# Patient Record
Sex: Male | Born: 1955
Health system: Southern US, Community
[De-identification: ages and names within clinical notes are randomized; demographics above are authoritative.]

## PROBLEM LIST (undated history)

## (undated) DIAGNOSIS — I639 Cerebral infarction, unspecified: Secondary | ICD-10-CM

## (undated) DIAGNOSIS — H269 Unspecified cataract: Secondary | ICD-10-CM

## (undated) DIAGNOSIS — N429 Disorder of prostate, unspecified: Secondary | ICD-10-CM

## (undated) DIAGNOSIS — F32A Depression, unspecified: Secondary | ICD-10-CM

## (undated) DIAGNOSIS — F329 Major depressive disorder, single episode, unspecified: Secondary | ICD-10-CM

## (undated) DIAGNOSIS — F419 Anxiety disorder, unspecified: Secondary | ICD-10-CM

## (undated) HISTORY — DX: Cerebral infarction, unspecified: I63.9

## (undated) HISTORY — PX: HERNIA REPAIR: SHX51

## (undated) HISTORY — DX: Unspecified cataract: H26.9

## (undated) HISTORY — DX: Anxiety disorder, unspecified: F41.9

---

## 1999-04-16 ENCOUNTER — Observation Stay (HOSPITAL_COMMUNITY): Admission: RE | Admit: 1999-04-16 | Discharge: 1999-04-17 | Payer: Self-pay | Admitting: Surgery

## 2005-05-17 ENCOUNTER — Ambulatory Visit: Payer: Self-pay | Admitting: Internal Medicine

## 2006-05-01 ENCOUNTER — Emergency Department (HOSPITAL_COMMUNITY): Admission: EM | Admit: 2006-05-01 | Discharge: 2006-05-01 | Payer: Self-pay | Admitting: Emergency Medicine

## 2009-04-13 ENCOUNTER — Ambulatory Visit: Payer: Self-pay | Admitting: Gastroenterology

## 2018-01-08 ENCOUNTER — Other Ambulatory Visit: Payer: Self-pay

## 2018-01-08 ENCOUNTER — Encounter: Payer: Self-pay | Admitting: Emergency Medicine

## 2018-01-08 ENCOUNTER — Emergency Department: Payer: BLUE CROSS/BLUE SHIELD

## 2018-01-08 ENCOUNTER — Observation Stay
Admission: EM | Admit: 2018-01-08 | Discharge: 2018-01-12 | Payer: BLUE CROSS/BLUE SHIELD | Attending: Internal Medicine | Admitting: Internal Medicine

## 2018-01-08 DIAGNOSIS — Z79899 Other long term (current) drug therapy: Secondary | ICD-10-CM | POA: Insufficient documentation

## 2018-01-08 DIAGNOSIS — R531 Weakness: Secondary | ICD-10-CM | POA: Diagnosis present

## 2018-01-08 DIAGNOSIS — M609 Myositis, unspecified: Secondary | ICD-10-CM | POA: Diagnosis not present

## 2018-01-08 DIAGNOSIS — F332 Major depressive disorder, recurrent severe without psychotic features: Secondary | ICD-10-CM | POA: Diagnosis not present

## 2018-01-08 DIAGNOSIS — L8990 Pressure ulcer of unspecified site, unspecified stage: Secondary | ICD-10-CM

## 2018-01-08 DIAGNOSIS — M549 Dorsalgia, unspecified: Secondary | ICD-10-CM | POA: Diagnosis present

## 2018-01-08 DIAGNOSIS — E43 Unspecified severe protein-calorie malnutrition: Secondary | ICD-10-CM

## 2018-01-08 DIAGNOSIS — Z87891 Personal history of nicotine dependence: Secondary | ICD-10-CM | POA: Insufficient documentation

## 2018-01-08 DIAGNOSIS — E559 Vitamin D deficiency, unspecified: Secondary | ICD-10-CM | POA: Diagnosis not present

## 2018-01-08 DIAGNOSIS — R2689 Other abnormalities of gait and mobility: Secondary | ICD-10-CM | POA: Diagnosis present

## 2018-01-08 DIAGNOSIS — E440 Moderate protein-calorie malnutrition: Secondary | ICD-10-CM

## 2018-01-08 HISTORY — DX: Depression, unspecified: F32.A

## 2018-01-08 HISTORY — DX: Major depressive disorder, single episode, unspecified: F32.9

## 2018-01-08 LAB — CBC
HEMATOCRIT: 40 % (ref 40.0–52.0)
Hemoglobin: 14 g/dL (ref 13.0–18.0)
MCH: 32 pg (ref 26.0–34.0)
MCHC: 35.1 g/dL (ref 32.0–36.0)
MCV: 91.3 fL (ref 80.0–100.0)
Platelets: 190 10*3/uL (ref 150–440)
RBC: 4.38 MIL/uL — ABNORMAL LOW (ref 4.40–5.90)
RDW: 13 % (ref 11.5–14.5)
WBC: 6.2 10*3/uL (ref 3.8–10.6)

## 2018-01-08 LAB — COMPREHENSIVE METABOLIC PANEL
ALT: 93 U/L — ABNORMAL HIGH (ref 0–44)
ANION GAP: 8 (ref 5–15)
AST: 291 U/L — ABNORMAL HIGH (ref 15–41)
Albumin: 3.8 g/dL (ref 3.5–5.0)
Alkaline Phosphatase: 50 U/L (ref 38–126)
BUN: 17 mg/dL (ref 8–23)
CHLORIDE: 103 mmol/L (ref 98–111)
CO2: 27 mmol/L (ref 22–32)
Calcium: 8.7 mg/dL — ABNORMAL LOW (ref 8.9–10.3)
Creatinine, Ser: 0.9 mg/dL (ref 0.61–1.24)
Glucose, Bld: 117 mg/dL — ABNORMAL HIGH (ref 70–99)
Potassium: 3.7 mmol/L (ref 3.5–5.1)
SODIUM: 138 mmol/L (ref 135–145)
Total Bilirubin: 2 mg/dL — ABNORMAL HIGH (ref 0.3–1.2)
Total Protein: 7 g/dL (ref 6.5–8.1)

## 2018-01-08 LAB — ETHANOL

## 2018-01-08 LAB — URINALYSIS, COMPLETE (UACMP) WITH MICROSCOPIC
BACTERIA UA: NONE SEEN
BILIRUBIN URINE: NEGATIVE
Glucose, UA: NEGATIVE mg/dL
KETONES UR: 5 mg/dL — AB
LEUKOCYTES UA: NEGATIVE
NITRITE: NEGATIVE
PROTEIN: NEGATIVE mg/dL
Specific Gravity, Urine: 1.011 (ref 1.005–1.030)
Squamous Epithelial / LPF: NONE SEEN (ref 0–5)
pH: 6 (ref 5.0–8.0)

## 2018-01-08 LAB — TROPONIN I

## 2018-01-08 LAB — SALICYLATE LEVEL

## 2018-01-08 LAB — ACETAMINOPHEN LEVEL

## 2018-01-08 LAB — TSH: TSH: 2.553 u[IU]/mL (ref 0.350–4.500)

## 2018-01-08 MED ORDER — ONDANSETRON HCL 4 MG PO TABS: 4.0000 mg | ORAL_TABLET | Freq: Four times a day (QID) | ORAL | Status: DC | PRN

## 2018-01-08 MED ORDER — ONDANSETRON HCL 4 MG/2ML IJ SOLN: 4.0000 mg | Freq: Four times a day (QID) | INTRAMUSCULAR | Status: DC | PRN

## 2018-01-08 MED ORDER — IBUPROFEN 400 MG PO TABS: 600.0000 mg | ORAL_TABLET | Freq: Four times a day (QID) | ORAL | Status: DC | PRN

## 2018-01-08 MED ORDER — ACETAMINOPHEN 325 MG PO TABS: 650.0000 mg | ORAL_TABLET | Freq: Four times a day (QID) | ORAL | Status: DC | PRN

## 2018-01-08 MED ORDER — ACETAMINOPHEN 650 MG RE SUPP: 650.0000 mg | Freq: Four times a day (QID) | RECTAL | Status: DC | PRN

## 2018-01-08 MED ORDER — ENOXAPARIN SODIUM 40 MG/0.4ML ~~LOC~~ SOLN: 40.0000 mg | SUBCUTANEOUS | Status: DC

## 2018-01-08 NOTE — ED Notes (Signed)
EMS pt to lobby , via wheelchair , back pain x4days , no injury , neuro intact

## 2018-01-08 NOTE — H&P (Signed)
[facility] [place] NAME: [name]    MR#:  [id number]  DATE OF BIRTH:  [birth year]  DATE OF ADMISSION:  01/08/2018  PRIMARY CARE PHYSICIAN: [facility]   REQUESTING/REFERRING PHYSICIAN: Clearnce Hasten, MD  CHIEF COMPLAINT:   Chief Complaint  Patient presents with  . Weakness    HISTORY OF PRESENT ILLNESS:  [name]  is a 62 y.o. male who presents with complaint of weakness and back pain.  Patient states that he has been so weak over the past couple weeks he has had a hard time getting out of bed to do anything.  He states he often feels unsteady on his feet, has something of a shuffling gait.  Some concern by ED physician for possible Parkinson's disease.  PAST MEDICAL HISTORY:   Past Medical History:  Diagnosis Date  . Depression      PAST SURGICAL HISTORY:   Past Surgical History:  Procedure Laterality Date  . HERNIA REPAIR       SOCIAL HISTORY:   Social History   Tobacco Use  . Smoking status: Former Smoker  Substance Use Topics  . Alcohol use: Not on file     FAMILY HISTORY:  Family History reviewed and is noncontributory   DRUG ALLERGIES:  No Known Allergies  MEDICATIONS AT HOME:   Prior to Admission medications   Medication Sig Start Date End Date Taking? Authorizing Provider  ibuprofen (ADVIL,MOTRIN) 100 MG tablet Take 200 mg by mouth every 6 (six) hours as needed for pain or fever.   Yes [provider]    REVIEW OF SYSTEMS:  Review of Systems  Constitutional: Positive for malaise/fatigue. Negative for chills, fever and weight loss.  HENT: Negative for ear pain, hearing loss and tinnitus.   Eyes: Negative for blurred vision, double vision, pain and redness.  Respiratory: Negative for cough, hemoptysis and shortness of breath.   Cardiovascular: Negative for chest pain, palpitations, orthopnea and leg swelling.  Gastrointestinal: Negative for abdominal pain,  constipation, diarrhea, nausea and vomiting.  Genitourinary: Negative for dysuria, frequency and hematuria.  Musculoskeletal: Positive for back pain. Negative for joint pain and neck pain.  Skin:       No acne, rash, or lesions  Neurological: Positive for weakness. Negative for dizziness, tremors and focal weakness.  Endo/Heme/Allergies: Negative for polydipsia. Does not bruise/bleed easily.  Psychiatric/Behavioral: Negative for depression. The patient is not nervous/anxious and does not have insomnia.      VITAL SIGNS:   Vitals:   01/08/18 1800 01/08/18 2000 01/08/18 2030 01/08/18 2100  BP: (!) 145/82 136/78 140/82 135/83  Pulse: 72 66 64 70  Resp: 17 12 13 11   Temp:      TempSrc:      SpO2: 100% 100% 99% 100%  Weight:      Height:       Wt Readings from Last 3 Encounters:  01/08/18 68.9 kg (152 lb)    PHYSICAL EXAMINATION:  Physical Exam  Vitals reviewed. Constitutional: He is oriented to person, place, and time. He appears well-developed and well-nourished. No distress.  HENT:  Head: Normocephalic and atraumatic.  Mouth/Throat: Oropharynx is clear and moist.  Eyes: Pupils are equal, round, and reactive to light. Conjunctivae and EOM are normal. No scleral icterus.  Neck: Normal range of motion. Neck supple. No JVD present. No thyromegaly present.  Cardiovascular: Normal rate, regular rhythm and intact distal pulses. Exam reveals no gallop and no friction rub.  No murmur heard. Respiratory: Effort normal and breath sounds normal. No respiratory distress. He has no wheezes. He has no rales.  GI: Soft. Bowel sounds are normal. He exhibits no distension. There is no tenderness.  Musculoskeletal: Normal range of motion. He exhibits no edema.  No arthritis, no gout  Lymphadenopathy:    He has no cervical adenopathy.  Neurological: He is alert and oriented to person, place, and time. No cranial nerve deficit.  No dysarthria, no aphasia, and does not have any cogwheel  rigidity, he does have some proximal lower extremity weakness  Skin: Skin is warm and dry. No rash noted. No erythema.  Psychiatric: His behavior is normal. Judgment and thought content normal.  Depressed affect    LABORATORY PANEL:   CBC Recent Labs  Lab 01/08/18 1702  WBC 6.2  HGB 14.0  HCT 40.0  PLT 190   ------------------------------------------------------------------------------------------------------------------  Chemistries  Recent Labs  Lab 01/08/18 1702  NA 138  K 3.7  CL 103  CO2 27  GLUCOSE 117*  BUN 17  CREATININE 0.90  CALCIUM 8.7*  AST 291*  ALT 93*  ALKPHOS 50  BILITOT 2.0*   ------------------------------------------------------------------------------------------------------------------  Cardiac Enzymes Recent Labs  Lab 01/08/18 1702  TROPONINI <0.03   ------------------------------------------------------------------------------------------------------------------  RADIOLOGY:  Ct Head Wo Contrast  Result Date: 01/08/2018 CLINICAL DATA:  Weakness, back pain for 4 days, no injury. EXAM: CT HEAD WITHOUT CONTRAST TECHNIQUE: Contiguous axial images were obtained from the base of the skull through the vertex without intravenous contrast. COMPARISON:  05/01/2006. FINDINGS: Brain: No evidence for acute infarction, hemorrhage, mass lesion, hydrocephalus, or extra-axial fluid. Mild atrophy. Hypoattenuation of white matter, likely small vessel disease. Vascular: No hyperdense vessel or unexpected calcification. Skull: Normal. Negative for fracture or focal lesion. Sinuses/Orbits: No acute finding. Other: None. IMPRESSION: Mild atrophy. No acute intracranial findings. Progression of cerebral volume loss since 2007. Electronically Signed   By: Staci Righter M.D.   On: 01/08/2018 19:10   Mr Lumbar Spine Wo Contrast  Result Date: 01/08/2018 CLINICAL DATA:  Initial evaluation for generalized weakness with low back pain for 4 days. EXAM: MRI LUMBAR SPINE WITHOUT  CONTRAST TECHNIQUE: Multiplanar, multisequence MR imaging of the lumbar spine was performed. No intravenous contrast was administered. COMPARISON:  None. FINDINGS: Segmentation: Normal segmentation. Lowest well-formed disc labeled the L5-S1 level. Alignment: Trace retrolisthesis of L2 on L3. Vertebral bodies otherwise normally aligned with preservation of the normal lumbar lordosis. Vertebrae: Vertebral body heights maintained without evidence for acute or chronic fracture. Bone marrow signal intensity within normal limits. No discrete or worrisome osseous lesions. Chronic reactive endplate changes present about the L5-S1 interspace. Conus medullaris and cauda equina: Conus extends to the T12 level. Conus and cauda equina appear normal. Paraspinal and other soft tissues: Mild paraspinous edema within the lower posterior paraspinous musculature, which could reflect myositis and/or muscular injury/strain. Subcentimeter T2 hyperintense cyst noted within the right kidney. Visualized visceral structures otherwise unremarkable. Disc levels: L1-2: Chronic intervertebral disc space narrowing without significant disc bulge. No stenosis. L2-3: Trace retrolisthesis. Mild chronic intervertebral disc space narrowing with diffuse disc bulge and disc desiccation. Mild facet hypertrophy. No stenosis. L3-4: Mild diffuse disc bulge with disc desiccation. Mild facet ligament flavum hypertrophy. Superimposed shallow right foraminal disc protrusion contacts the exiting right L3 nerve root (series 3, image 3). No significant spinal stenosis. Mild to moderate right L3 foraminal narrowing. L4-5: Mild diffuse disc bulge. Mild facet hypertrophy. No significant spinal stenosis. Mild bilateral L4 foraminal narrowing, slightly greater  on the right. L5-S1: Chronic intervertebral disc space narrowing with diffuse disc bulge and disc desiccation. Mild facet hypertrophy, greater on the right. No significant spinal stenosis. Mild to moderate left L5  foraminal stenosis. IMPRESSION: 1. No evidence for cord compression or cauda equina. 2. Soft tissue edema involving the posterior paraspinous musculature bilaterally, right slightly greater than left. Finding is of uncertain etiology or significance, but could reflect acute myositis or possibly muscular injury/strain. Correlation with history and CPK suggested. 3. Shallow right foraminal disc protrusion at L3-4, contacting and potentially affecting the right L3 nerve root. 4. Disc bulge with facet hypertrophy at L4-5 and L5-S1 with resultant mild bilateral L4 with mild to moderate left L5 foraminal stenosis. Electronically Signed   By: Jeannine Boga M.D.   On: 01/08/2018 22:05    EKG:   Orders placed or performed during the hospital encounter of 01/08/18  . ED EKG  . ED EKG  . EKG 12-Lead  . EKG 12-Lead    IMPRESSION AND PLAN:  Principal Problem:   Back pain -MRI showed muscular inflammation likely causing the patient's back pain.  PRN anti-inflammatory Active Problems:   Shuffling gait -some concern for possible Parkinson's, will get a neurology consult, will also check thyroid function and labs for any possibility of polymyositis   Depression -patient has a history of significant depression, family seems to feel like he is quite depressed recently and this is contributing to his overall decline.  We will get a psychiatry consult  Chart review performed and case discussed with ED provider. Labs, imaging and/or ECG reviewed by provider and discussed with patient/family. Management plans discussed with the patient and/or family.  DVT PROPHYLAXIS: SubQ lovenox  GI PROPHYLAXIS: None  ADMISSION STATUS: Observation  CODE STATUS: Full  TOTAL TIME TAKING CARE OF THIS PATIENT: 40 minutes.   [name] 01/08/2018, 10:27 PM  [facility]  Office  [phone]  CC: Primary care physician; [place]  Note:  This document was prepared  using Dragon voice recognition software and may include unintentional dictation errors.

## 2018-01-08 NOTE — ED Triage Notes (Addendum)
General weakness x 1 year. States has had increased back pain. Denies taking medication for it.

## 2018-01-08 NOTE — ED Notes (Signed)
Patient transported to MRI 

## 2018-01-08 NOTE — ED Notes (Signed)
Pt. Returned to tx. room in stable condition with no acute changes since departure from unit for scans.   

## 2018-01-08 NOTE — ED Notes (Signed)
1C unable to take report at this time.  

## 2018-01-08 NOTE — ED Notes (Signed)
Patient transported to CT 

## 2018-01-08 NOTE — ED Provider Notes (Signed)
[facility] Emergency Department Provider Note ____________________________________________   First MD Initiated Contact with Patient 01/08/18 1703     (approximate)  I have reviewed the triage vital signs and the nursing notes.   HISTORY  Chief Complaint Weakness  HPI JAYKE CAUL is a 62 y.o. male with a history of depression as well as back pain over the past 3 to 4 years was presented to the emergency department today with bilateral lower externally weakness which is worsened over the past 3 to 4 days.  He says it feels like a burning when he gets up and starts to move.  His family also reports a shuffling gait.  Says that he is normally very "relaxed" with slow speech.  No family history of Parkinson's disease or dementia.  Patient says that he is also had some history of urinary incontinence which is chronic.  No loss of bowel continence.  Denies any numbness.  He says that the back pain does sometimes radiate down his right lower extremity.  The patient is also accompanied by his grandson who says that the symptoms have been progressive over the last 3 to 4 years and that he is concerned that the patient is severely depressed.  The patient does not drink.  The grandson reports that the patient has never threatened suicide.   Past Medical History:  Diagnosis Date  . Depression     There are no active problems to display for this patient.   Past Surgical History:  Procedure Laterality Date  . HERNIA REPAIR      Prior to Admission medications   Not on File    Allergies Patient has no known allergies.  No family history on file.  Social History Social History   Tobacco Use  . Smoking status: Former Smoker  Substance Use Topics  . Alcohol use: Not on file  . Drug use: Not on file    Review of Systems  Constitutional: No fever/chills Eyes: No visual changes. ENT: No sore throat. Cardiovascular: Denies chest pain. Respiratory:  Denies shortness of breath. Gastrointestinal: No abdominal pain.  No nausea, no vomiting.  No diarrhea.  No constipation. Genitourinary: As above Musculoskeletal: Negative for back pain. Skin: Negative for rash. Neurological: Negative for headaches   ____________________________________________   PHYSICAL EXAM:  VITAL SIGNS: ED Triage Vitals  Enc Vitals Group     BP 01/08/18 1654 133/82     Pulse Rate 01/08/18 1654 88     Resp 01/08/18 1654 16     Temp 01/08/18 1654 97.9 F (36.6 C)     Temp Source 01/08/18 1654 Oral     SpO2 01/08/18 1654 100 %     Weight 01/08/18 1655 152 lb (68.9 kg)     Height 01/08/18 1655 5\' 10"  (1.778 m)     Head Circumference --      Peak Flow --      Pain Score 01/08/18 1659 0     Pain Loc --      Pain Edu? --      Excl. in [place]? --     Constitutional: Alert and oriented.  No acute distress.  Wax like feces.  Slow response to questions.  No resting tremor. Eyes: Conjunctivae are normal.  Head: Atraumatic. Nose: No congestion/rhinnorhea. Mouth/Throat: Mucous membranes are moist.  Neck: No stridor.   Cardiovascular: Normal rate, regular rhythm. Grossly normal heart sounds.  Good peripheral circulation equal and bilateral dorsalis pedis pulses. Respiratory: Normal respiratory effort.  No retractions. Lungs CTAB. Gastrointestinal: Soft and nontender. No distention. No CVA tenderness. Musculoskeletal: No lower extremity tenderness nor edema.  No joint effusions.  5 out of 5 strength bilateral extremities.  Negative straight leg raise bilaterally.  No saddle anesthesia.  Patient requires assistance to ambulate.  Does not walk with a shuffling gait.  Family states that he usually needs assistance for ambulation but this has drastically increased over the past 3 to 4 days.   Neurologic:  Normal speech and language. No gross focal neurologic deficits are appreciated. Skin:  Skin is warm, dry and intact. No rash noted. Psychiatric: Mood and affect are  normal. Speech and behavior are normal.  ____________________________________________   LABS (all labs ordered are listed, but only abnormal results are displayed)  Labs Reviewed  CBC - Abnormal; Notable for the following components:      Result Value   RBC 4.38 (*)    All other components within normal limits  URINALYSIS, COMPLETE (UACMP) WITH MICROSCOPIC - Abnormal; Notable for the following components:   Color, Urine YELLOW (*)    APPearance CLEAR (*)    Hgb urine dipstick SMALL (*)    Ketones, ur 5 (*)    All other components within normal limits  COMPREHENSIVE METABOLIC PANEL - Abnormal; Notable for the following components:   Glucose, Bld 117 (*)    Calcium 8.7 (*)    AST 291 (*)    ALT 93 (*)    Total Bilirubin 2.0 (*)    All other components within normal limits  TROPONIN I  ACETAMINOPHEN LEVEL  SALICYLATE LEVEL   ____________________________________________  EKG  ED ECG REPORT I, Doran Stabler, the attending physician, personally viewed and interpreted this ECG.   Date: 01/08/2018  EKG Time: 1716  Rate: 93  Rhythm: normal sinus rhythm  Axis: Left axis  Intervals:none  ST&T Change: No ST segment elevation or depression.  No abnormal T wave inversion.  ____________________________________________  RADIOLOGY  CT head without acute findings. ____________________________________________   PROCEDURES  Procedure(s) performed:   Procedures  Critical Care performed:   ____________________________________________   INITIAL IMPRESSION / ASSESSMENT AND PLAN / ED COURSE  Pertinent labs & imaging results that were available during my care of the patient were reviewed by me and considered in my medical decision making (see chart for details).  Differential diagnosis includes, but is not limited to, alcohol, illicit or prescription medications, or other toxic ingestion; intracranial pathology such as stroke or intracerebral hemorrhage; fever or  infectious causes including sepsis; hypoxemia and/or hypercarbia; uremia; trauma; endocrine related disorders such as diabetes, hypoglycemia, and thyroid-related diseases; hypertensive encephalopathy; etc. As part of my medical decision making, I reviewed the following data within the [facility] previous records on file for review.  Patient with constellation of symptoms likely represent, cauda equina, Parkinson's disease, severe depression.  Unclear etiology at this time.  CT reassuring.  Pending MRI of the lumbar spine.  ----------------------------------------- 9:55 PM on 01/08/2018 -----------------------------------------  Waiting for formal MRI read but I looked at the images and it does not appear to have significant cord compression.  Patient be admitted to the hospital for altered mentation as well as generalized weakness.  Likely will require psychiatry as well as neurology evaluation.  Family as well as patient aware.  Signed out to Dr. Jannifer Franklin. ____________________________________________   FINAL CLINICAL IMPRESSION(S) / ED DIAGNOSES  Weakness.    NEW MEDICATIONS STARTED DURING THIS VISIT:  New Prescriptions   No medications  on file     Note:  This document was prepared using Dragon voice recognition software and may include unintentional dictation errors.     Orbie Pyo, MD 01/08/18 2155

## 2018-01-09 ENCOUNTER — Other Ambulatory Visit: Payer: Self-pay

## 2018-01-09 DIAGNOSIS — M545 Low back pain: Secondary | ICD-10-CM

## 2018-01-09 DIAGNOSIS — E43 Unspecified severe protein-calorie malnutrition: Secondary | ICD-10-CM

## 2018-01-09 DIAGNOSIS — F332 Major depressive disorder, recurrent severe without psychotic features: Secondary | ICD-10-CM

## 2018-01-09 DIAGNOSIS — E440 Moderate protein-calorie malnutrition: Secondary | ICD-10-CM

## 2018-01-09 DIAGNOSIS — L8990 Pressure ulcer of unspecified site, unspecified stage: Secondary | ICD-10-CM

## 2018-01-09 LAB — CBC
HEMATOCRIT: 37.9 % — AB (ref 40.0–52.0)
HEMOGLOBIN: 13 g/dL (ref 13.0–18.0)
MCH: 31.4 pg (ref 26.0–34.0)
MCHC: 34.2 g/dL (ref 32.0–36.0)
MCV: 91.9 fL (ref 80.0–100.0)
Platelets: 186 10*3/uL (ref 150–440)
RBC: 4.12 MIL/uL — ABNORMAL LOW (ref 4.40–5.90)
RDW: 12.9 % (ref 11.5–14.5)
WBC: 6.4 10*3/uL (ref 3.8–10.6)

## 2018-01-09 LAB — SEDIMENTATION RATE: Sed Rate: 16 mm/hr (ref 0–20)

## 2018-01-09 LAB — BASIC METABOLIC PANEL
Anion gap: 9 (ref 5–15)
BUN: 14 mg/dL (ref 8–23)
CO2: 27 mmol/L (ref 22–32)
Calcium: 8.5 mg/dL — ABNORMAL LOW (ref 8.9–10.3)
Chloride: 102 mmol/L (ref 98–111)
Creatinine, Ser: 0.78 mg/dL (ref 0.61–1.24)
GFR calc Af Amer: >60 mL/min (ref >60)
Glucose, Bld: 80 mg/dL (ref 70–99)
POTASSIUM: 3.6 mmol/L (ref 3.5–5.1)
Sodium: 138 mmol/L (ref 135–145)

## 2018-01-09 LAB — CK: CK TOTAL: 7009 U/L — AB (ref 49–397)

## 2018-01-09 LAB — VITAMIN B12: Vitamin B-12: 145 pg/mL — ABNORMAL LOW (ref 180–914)

## 2018-01-09 LAB — T4, FREE: FREE T4: 1.09 ng/dL (ref 0.82–1.77)

## 2018-01-09 MED ORDER — METHYLPREDNISOLONE 4 MG PO TBPK
4.0000 mg | ORAL_TABLET | ORAL | Status: AC
  Administered 2018-01-09: 18:00:00 4 mg via ORAL

## 2018-01-09 MED ORDER — ENSURE ENLIVE PO LIQD
237.0000 mL | Freq: Two times a day (BID) | ORAL | Status: DC
  Administered 2018-01-10 – 2018-01-12 (×4): 237 mL via ORAL

## 2018-01-09 MED ORDER — METHYLPREDNISOLONE 4 MG PO TBPK
8.0000 mg | ORAL_TABLET | Freq: Every evening | ORAL | Status: AC
  Administered 2018-01-09: 22:00:00 8 mg via ORAL

## 2018-01-09 MED ORDER — METHYLPREDNISOLONE 4 MG PO TBPK
8.0000 mg | ORAL_TABLET | Freq: Every evening | ORAL | Status: AC
  Administered 2018-01-10: 8 mg via ORAL

## 2018-01-09 MED ORDER — METHYLPREDNISOLONE 4 MG PO TBPK
8.0000 mg | ORAL_TABLET | Freq: Every morning | ORAL | Status: AC
  Administered 2018-01-09: 12:00:00 8 mg via ORAL
  Filled 2018-01-09: qty 21

## 2018-01-09 MED ORDER — METHYLPREDNISOLONE 4 MG PO TBPK
4.0000 mg | ORAL_TABLET | Freq: Three times a day (TID) | ORAL | Status: AC
  Administered 2018-01-10 (×3): 4 mg via ORAL

## 2018-01-09 MED ORDER — ADULT MULTIVITAMIN W/MINERALS CH
1.0000 | ORAL_TABLET | Freq: Every day | ORAL | Status: DC
  Administered 2018-01-10 – 2018-01-12 (×3): 1 via ORAL
  Filled 2018-01-09 (×4): qty 1

## 2018-01-09 MED ORDER — ENOXAPARIN SODIUM 40 MG/0.4ML ~~LOC~~ SOLN
40.0000 mg | SUBCUTANEOUS | Status: DC
  Administered 2018-01-09 – 2018-01-10 (×2): 40 mg via SUBCUTANEOUS
  Filled 2018-01-09 (×3): qty 0.4

## 2018-01-09 MED ORDER — METHYLPREDNISOLONE 4 MG PO TBPK
4.0000 mg | ORAL_TABLET | Freq: Four times a day (QID) | ORAL | Status: DC
  Administered 2018-01-11 – 2018-01-12 (×5): 4 mg via ORAL

## 2018-01-09 MED ORDER — SODIUM CHLORIDE 0.9 % IV SOLN
INTRAVENOUS | Status: DC
  Administered 2018-01-09 – 2018-01-12 (×6): via INTRAVENOUS

## 2018-01-09 MED ORDER — METHYLPREDNISOLONE 4 MG PO TBPK
4.0000 mg | ORAL_TABLET | ORAL | Status: AC
  Administered 2018-01-09: 15:00:00 4 mg via ORAL

## 2018-01-09 NOTE — Progress Notes (Signed)
   01/09/18 1035  Clinical Encounter Type  Visited With Patient  Visit Type Initial;Spiritual support  Referral From Nurse  Consult/Referral To Chaplain  Spiritual Encounters  Spiritual Needs Emotional;Grief support   CH received an OR to talk with PT about suicidal thoughts and information for a AD. [place] discussed PT mental state with RN as the chart showed that a Phycological consult. RN recommended to check back tomorrow. Ch visited with PT who was very subdued and spoke very low. PT shared that he could not sleep and was depressed but didn't state why. Later he said that his wife left 3 months ago but I feel this is not the only cause of his depression because PT stated that he once weighed 200 lbs and now weighs 138 lbs. [place] will continue to follow up with PT.

## 2018-01-09 NOTE — Progress Notes (Signed)
Initial Nutrition Assessment  DOCUMENTATION CODES:   Non-severe (moderate) malnutrition in context of social or environmental circumstances  INTERVENTION:  Agree with regular diet.  Provide Ensure Enlive po BID, each supplement provides 350 kcal and 20 grams of protein.  Provide daily MVI.  Encouraged intake of 3 regular, well-balanced meals at home. Discussed options such as asking grandson to make a sandwich for lunch the night before and putting in refrigerator or having leftovers or frozen meals he can heat up in the microwave.  NUTRITION DIAGNOSIS:   Moderate Malnutrition related to social / environmental circumstances(depression, difficulty with ambulating and preparing meals for self) as evidenced by moderate fat depletion, moderate muscle depletion.  GOAL:   Patient will meet greater than or equal to 90% of their needs  MONITOR:   PO intake, Supplement acceptance, Labs, Weight trends, Skin, I & O's  REASON FOR ASSESSMENT:   Malnutrition Screening Tool    ASSESSMENT:   62 year old male with PMHx of depression who presented with weakness, back pain likely caused by muscular inflammation, shuffling gait concerning for Parkinson's disease.   Met with patient at bedside. He has Air cabin crew at bedside. Patient reports he has had a decreased appetite for a while now, but is unsure exactly how long. He is not sure what is causing his decreased appetite. He does report some depression. His wife left him 3 months ago. His grandson lives with him now and helps to take care of him. He only eats one meal per day now and that is usually prepared or purchased by his grandson. He may have a burger, a chicken sandwich, or a meal prepared at home. Otherwise he just snacks on peanut butter crackers during the day. He has not previously tried any oral nutrition supplements. Patient endorses one episode of nausea when he became very weak. Denies any abdominal pain or difficulty  chewing/swallowing. Does endorse constipation. He is having difficulty getting around and preparing meals for himself.  UBW was 200 lbs. He believes he last weighed this in 2010 and has had slow weight loss. No weight history in chart to trend.  Meal Completion: 100% of breakfast today per chart  Medications reviewed and include: enoxaparin, NS @ 125 mL/hr.  Labs reviewed: CK 7009.  NUTRITION - FOCUSED PHYSICAL EXAM:    Most Recent Value  Orbital Region  Moderate depletion  Upper Arm Region  Severe depletion  Thoracic and Lumbar Region  Moderate depletion  Buccal Region  Moderate depletion  Temple Region  Moderate depletion  Clavicle Bone Region  Moderate depletion  Clavicle and Acromion Bone Region  Moderate depletion  Scapular Bone Region  Moderate depletion  Dorsal Hand  Moderate depletion  Patellar Region  Moderate depletion  Anterior Thigh Region  Moderate depletion  Posterior Calf Region  Severe depletion  Edema (RD Assessment)  None  Hair  Reviewed  Eyes  Reviewed  Mouth  Reviewed  Skin  Reviewed  Nails  Reviewed     Diet Order:   Diet Order           Diet regular Room service appropriate? Yes; Fluid consistency: Thin  Diet effective now          EDUCATION NEEDS:   Education needs have been addressed  Skin:  Skin Assessment: Skin Integrity Issues: Skin Integrity Issues:: Stage I, Other (Comment) Stage I: medial buttocks Other: MSAD to buttocks and groin  Last BM:  01/09/2018 - medium type 4  Height:   Ht Readings  from Last 1 Encounters:  01/08/18 '5\' 10"'  (1.778 m)    Weight:   Wt Readings from Last 1 Encounters:  01/08/18 138 lb 10.7 oz (62.9 kg)    Ideal Body Weight:  75.5 kg  BMI:  Body mass index is 19.9 kg/m.  Estimated Nutritional Needs:   Kcal:  1722-2010 (MSJ x 1.2-1.4)  Protein:  75-90 grams (1.2-1.4 grams/kg)  Fluid:  1.8-2.2 L/day (30-35 mL/kg)  Willey Blade, MS, RD, LDN Office: [phone] Pager: [phone] After  Hours/Weekend Pager: [phone]

## 2018-01-09 NOTE — Consult Note (Signed)
[facility] Consult   Reason for Consult: This is a 62 year old man brought to the hospital because of progressive weakness and bradykinesia.  Concern about severe depression. Referring Physician: Posey Pronto Patient Identification: [name] MRN:  [id number] Principal Diagnosis: Severe recurrent major depression without psychotic features [facility]) Diagnosis:   Patient Active Problem List   Diagnosis Date Noted  . Malnutrition of moderate degree [E44.0] 01/09/2018  . Pressure injury of skin [L89.90] 01/09/2018  . Severe recurrent major depression without psychotic features ([place]) [F33.2] 01/09/2018  . Back pain [M54.9] 01/08/2018  . Shuffling gait [R26.89] 01/08/2018  . Depression [F32.9] 01/08/2018    Total Time spent with patient: 1 hour  Subjective:   [name] is a 62 y.o. male patient admitted with "I have just gotten very weak".  HPI: Patient seen chart reviewed.  I fortunately got to speak to his grandson and his wife face-to-face as well.  This is a 62 year old man who presents to the hospital with pretty extreme bradykinesia and withdrawal.  Patient is hardly ever getting up out of bed anymore.  Has been withdrawn to bed now for weeks.  Has been getting progressively weaker.  He is eating poorly.  His sleep habits are chaotic.  Patient states that he has been feeling bad ever since he got laid off from his job around 2010.  Shortly after that time he developed depression from which she has never really recovered and he has not been receiving any treatment probably in a couple years.  Mood stays down depressed and negative most of the time.  Has no motivation or interest in any activities.  Appetite is poor.  He is losing weight and becoming malnourished.  Patient denies any suicidal thoughts stating that it is against his religion but he is unable to articulate any real plan for the future.  Not reporting any psychotic symptoms.  Not abusing drugs or alcohol.  Not  currently receiving any psychiatric treatment.  When he came into the emergency room there was some concern about neurologic problems or possibly Parkinson's disease but it appears that his current condition is probably entirely due to his depression.  Social history: Patient wife left him about 3 months ago after getting progressively more tired of his severe symptoms and his refusal or inability to engage in any kind of treatment but she was here today in the room and is still very concerned about him.  He is currently living with his grandson who is his closest relative.  Yolanda Bonine has been working for years on trying to get the patient motivated to do things without much success.  Medical history: According to the wife and grandson, and confirmed by the patient, he has a long-standing aversion to going to doctors.  He really does not have any very clear history of specific medical problems.  No history identified of coronary artery disease.  His weakness has been getting progressively worse as he has been not getting out of bed.  Substance abuse history: Does not drink.  No history of alcohol or drug abuse.  Past Psychiatric History: Patient's wife and grandson were able to give me very useful information.  Evidently the patient was taken to see Dr. Thurmond Butts many years ago when he first developed depression.  Patient was diagnosed with depression and many medications were tried but the patient could never tolerate them always complaining of side effects and then stopping the medicines prematurely.  He later was seen by another psychiatrist who  also tried to get him on medicine with the same result.  Patient has never been hospitalized psychiatrically and has no history of mania and has no history of suicide attempts  Risk to Self: Is patient at risk for suicide?: Yes Risk to Others:   Prior Inpatient Therapy:   Prior Outpatient Therapy:    Past Medical History:  Past Medical History:  Diagnosis Date   . Depression     Past Surgical History:  Procedure Laterality Date  . HERNIA REPAIR     Family History: No family history on file. Family Psychiatric  History: Reportedly his father had severe mental health problems which could have been posttraumatic stress disorder related to the second world war but sounded like they continued into severe depressive symptoms later in life.  Coincidentally, the father was treated successfully with electroconvulsive treatment. Social History:  Social History   Substance and Sexual Activity  Alcohol Use Not on file     Social History   Substance and Sexual Activity  Drug Use Not on file    Social History   Socioeconomic History  . Marital status: Married    Spouse name: Not on file  . Number of children: Not on file  . Years of education: Not on file  . Highest education level: Not on file  Occupational History  . Not on file  Social Needs  . Financial resource strain: Not on file  . Food insecurity:    Worry: Not on file    Inability: Not on file  . Transportation needs:    Medical: Not on file    Non-medical: Not on file  Tobacco Use  . Smoking status: Former Smoker  Substance and Sexual Activity  . Alcohol use: Not on file  . Drug use: Not on file  . Sexual activity: Not on file  Lifestyle  . Physical activity:    Days per week: Not on file    Minutes per session: Not on file  . Stress: Not on file  Relationships  . Social connections:    Talks on phone: Not on file    Gets together: Not on file    Attends religious service: Not on file    Active member of club or organization: Not on file    Attends meetings of clubs or organizations: Not on file    Relationship status: Not on file  Other Topics Concern  . Not on file  Social History Narrative  . Not on file   Additional Social History:    Allergies:  No Known Allergies  Labs:  Results for orders placed or performed during the hospital encounter of 01/08/18  (from the past 48 hour(s))  CBC     Status: Abnormal   Collection Time: 01/08/18  5:02 PM  Result Value Ref Range   WBC 6.2 3.8 - 10.6 K/uL   RBC 4.38 (L) 4.40 - 5.90 MIL/uL   Hemoglobin 14.0 13.0 - 18.0 g/dL   HCT 40.0 40.0 - 52.0 %   MCV 91.3 80.0 - 100.0 fL   MCH 32.0 26.0 - 34.0 pg   MCHC 35.1 32.0 - 36.0 g/dL   RDW 13.0 11.5 - 14.5 %   Platelets 190 150 - 440 K/uL    Comment: Performed at [facility], [address], [place], [place] [address]  Comprehensive metabolic panel     Status: Abnormal   Collection Time: 01/08/18  5:02 PM  Result Value Ref Range   Sodium 138 135 -  145 mmol/L   Potassium 3.7 3.5 - 5.1 mmol/L   Chloride 103 98 - 111 mmol/L    Comment: Please note change in reference range.   CO2 27 22 - 32 mmol/L   Glucose, Bld 117 (H) 70 - 99 mg/dL    Comment: Please note change in reference range.   BUN 17 8 - 23 mg/dL    Comment: Please note change in reference range.   Creatinine, Ser 0.90 0.61 - 1.24 mg/dL   Calcium 8.7 (L) 8.9 - 10.3 mg/dL   Total Protein 7.0 6.5 - 8.1 g/dL   Albumin 3.8 3.5 - 5.0 g/dL   AST 291 (H) 15 - 41 U/L   ALT 93 (H) 0 - 44 U/L    Comment: Please note change in reference range.   Alkaline Phosphatase 50 38 - 126 U/L   Total Bilirubin 2.0 (H) 0.3 - 1.2 mg/dL   GFR calc non Af Amer >60 >60 mL/min   GFR calc Af Amer >60 >60 mL/min    Comment: (NOTE) The eGFR has been calculated using the CKD EPI equation. This calculation has not been validated in all clinical situations. eGFR's persistently <60 mL/min signify possible Chronic Kidney Disease.    Anion gap 8 5 - 15    Comment: Performed at [facility], [address] [place], [place] [address]  Troponin I     Status: None   Collection Time: 01/08/18  5:02 PM  Result Value Ref Range   Troponin I <0.03 <0.03 ng/mL    Comment: Performed at [facility], [address] [place], [place] [address]  TSH     Status: None   Collection Time: 01/08/18   5:02 PM  Result Value Ref Range   TSH 2.553 0.350 - 4.500 uIU/mL    Comment: Performed by a 3rd Generation assay with a functional sensitivity of <=0.01 uIU/mL. Performed at [facility], [address] [place], [place] [address]   Urinalysis, Complete w Microscopic     Status: Abnormal   Collection Time: 01/08/18  6:47 PM  Result Value Ref Range   Color, Urine YELLOW (A) YELLOW   APPearance CLEAR (A) CLEAR   Specific Gravity, Urine 1.011 1.005 - 1.030   pH 6.0 5.0 - 8.0   Glucose, UA NEGATIVE NEGATIVE mg/dL   Hgb urine dipstick SMALL (A) NEGATIVE   Bilirubin Urine NEGATIVE NEGATIVE   Ketones, ur 5 (A) NEGATIVE mg/dL   Protein, ur NEGATIVE NEGATIVE mg/dL   Nitrite NEGATIVE NEGATIVE   Leukocytes, UA NEGATIVE NEGATIVE   RBC / HPF 0-5 0 - 5 RBC/hpf   WBC, UA 0-5 0 - 5 WBC/hpf   Bacteria, UA NONE SEEN NONE SEEN   Squamous Epithelial / LPF NONE SEEN 0 - 5   Mucus PRESENT     Comment: Performed at [facility], [address] [place], [place] [address]  Acetaminophen level     Status: Abnormal   Collection Time: 01/08/18  6:47 PM  Result Value Ref Range   Acetaminophen (Tylenol), Serum <10 (L) 10 - 30 ug/mL    Comment: (NOTE) Therapeutic concentrations vary significantly. A range of 10-30 ug/mL  may be an effective concentration for many patients. However, some  are best treated at concentrations outside of this range. Acetaminophen concentrations >150 ug/mL at 4 hours after ingestion  and >50 ug/mL at 12 hours after ingestion are often associated with  toxic reactions. Performed at [facility], [address],  [place], [place] [address]   Salicylate level     Status: None   Collection Time: 01/08/18  6:47 PM  Result Value Ref Range   Salicylate Lvl <6.0 2.8 - 30.0 mg/dL    Comment: Performed at [facility], [address] [place], [place] [address]  Ethanol     Status: None   Collection Time: 01/08/18 10:00 PM  Result Value Ref  Range   Alcohol, Ethyl (B) <10 <10 mg/dL    Comment: (NOTE) Lowest detectable limit for serum alcohol is 10 mg/dL. For medical purposes only. Performed at [facility], [address] [place], [place] [address]   T4, free     Status: None   Collection Time: 01/09/18 12:30 AM  Result Value Ref Range   Free T4 1.09 0.82 - 1.77 ng/dL    Comment: (NOTE) Biotin ingestion may interfere with free T4 tests. If the results are inconsistent with the TSH level, previous test results, or the clinical presentation, then consider biotin interference. If needed, order repeat testing after stopping biotin. Performed at [facility], [address] [place], [place] [address]   CK     Status: Abnormal   Collection Time: 01/09/18 12:30 AM  Result Value Ref Range   Total CK 7,009 (H) 49 - 397 U/L    Comment: RESULT CONFIRMED BY MANUAL DILUTION / FLC Performed at [facility], [address] [place], [place] [address]   Basic metabolic panel     Status: Abnormal   Collection Time: 01/09/18 12:36 AM  Result Value Ref Range   Sodium 138 135 - 145 mmol/L   Potassium 3.6 3.5 - 5.1 mmol/L   Chloride 102 98 - 111 mmol/L    Comment: Please note change in reference range.   CO2 27 22 - 32 mmol/L   Glucose, Bld 80 70 - 99 mg/dL    Comment: Please note change in reference range.   BUN 14 8 - 23 mg/dL    Comment: Please note change in reference range.   Creatinine, Ser 0.78 0.61 - 1.24 mg/dL   Calcium 8.5 (L) 8.9 - 10.3 mg/dL   GFR calc non Af Amer >60 >60 mL/min   GFR calc Af Amer >60 >60 mL/min    Comment: (NOTE) The eGFR has been calculated using the CKD EPI equation. This calculation has not been validated in all clinical situations. eGFR's persistently <60 mL/min signify possible Chronic Kidney Disease.    Anion gap 9 5 - 15    Comment: Performed at [facility], [address] [place], [place] [address]  CBC     Status: Abnormal   Collection Time:  01/09/18 12:36 AM  Result Value Ref Range   WBC 6.4 3.8 - 10.6 K/uL   RBC 4.12 (L) 4.40 - 5.90 MIL/uL   Hemoglobin 13.0 13.0 - 18.0 g/dL   HCT 37.9 (L) 40.0 - 52.0 %   MCV 91.9 80.0 - 100.0 fL   MCH 31.4 26.0 - 34.0 pg   MCHC 34.2 32.0 - 36.0 g/dL   RDW 12.9 11.5 - 14.5 %   Platelets 186 150 - 440 K/uL    Comment: Performed at [facility], [address] [place], [place] [address]  Sedimentation rate     Status: None   Collection Time: 01/09/18  9:13 AM  Result Value Ref Range   Sed Rate 16 0 - 20 mm/hr    Comment: Performed at [facility], [address] [place], [place] [address]  Vitamin B12     Status: Abnormal   Collection Time: 01/09/18  9:48 AM  Result Value Ref Range   Vitamin B-12 145 (L) 180 - 914 pg/mL    Comment: (NOTE) This assay is not validated for testing neonatal or myeloproliferative syndrome specimens for Vitamin B12 levels. Performed at [facility], [place] [address] [place], [place] [address]     Current Facility-Administered Medications  Medication Dose Route Frequency Provider Last Rate Last Dose  . 0.9 %  sodium chloride infusion   Intravenous Continuous Dustin Flock, MD 125 mL/hr at 01/09/18 1211    . acetaminophen (TYLENOL) tablet 650 mg  650 mg Oral Q6H PRN Lance Coon, MD       Or  . acetaminophen (TYLENOL) suppository 650 mg  650 mg Rectal Q6H PRN Lance Coon, MD      . enoxaparin (LOVENOX) injection 40 mg  40 mg Subcutaneous Q24H Lance Coon, MD   40 mg at 01/09/18 0720  . feeding supplement (ENSURE ENLIVE) (ENSURE ENLIVE) liquid 237 mL  237 mL Oral BID BM Dustin Flock, MD      . ibuprofen (ADVIL,MOTRIN) tablet 600 mg  600 mg Oral Q6H PRN Lance Coon, MD      . methylPREDNISolone (MEDROL DOSEPAK) tablet 4 mg  4 mg Oral PC lunch Dustin Flock, MD      . methylPREDNISolone (MEDROL DOSEPAK) tablet 4 mg  4 mg Oral PC supper Dustin Flock, MD      . Derrill Memo ON 01/10/2018] methylPREDNISolone (MEDROL DOSEPAK)  tablet 4 mg  4 mg Oral 3 x daily with food Dustin Flock, MD      . Derrill Memo ON 01/11/2018] methylPREDNISolone (MEDROL DOSEPAK) tablet 4 mg  4 mg Oral 4X daily taper Dustin Flock, MD      . methylPREDNISolone (MEDROL DOSEPAK) tablet 8 mg  8 mg Oral Nightly Dustin Flock, MD      . Derrill Memo ON 01/10/2018] methylPREDNISolone (MEDROL DOSEPAK) tablet 8 mg  8 mg Oral Nightly Dustin Flock, MD      . Derrill Memo ON 01/10/2018] multivitamin with minerals tablet 1 tablet  1 tablet Oral Daily Dustin Flock, MD      . ondansetron (ZOFRAN) tablet 4 mg  4 mg Oral Q6H PRN Lance Coon, MD       Or  . ondansetron [facility]) injection 4 mg  4 mg Intravenous Q6H PRN Lance Coon, MD        Musculoskeletal: Strength & Muscle Tone: decreased Gait & Station: unsteady Patient leans: N/A  Psychiatric Specialty Exam: Physical Exam  Nursing note and vitals reviewed. Constitutional: He appears well-developed.  HENT:  Head: Normocephalic and atraumatic.  Eyes: Pupils are equal, round, and reactive to light. Conjunctivae are normal.  Neck: Normal range of motion.  Cardiovascular: Regular rhythm and normal heart sounds.  Respiratory: Effort normal. No respiratory distress.  GI: Soft.  Musculoskeletal: Normal range of motion.  Neurological: He is alert.  Patient is extremely slow and withdrawn in his motions throughout his body.  I can see why someone might of thought that he had Parkinson's disease although he does not have a tremor and is not really stiff just extremely bradykinetic  Skin: Skin is warm and dry.  Psychiatric: His affect is blunt. His speech is delayed. He is slowed and withdrawn. Thought content is not paranoid. Cognition and memory are impaired. He expresses inappropriate judgment. He exhibits a depressed mood. He expresses no homicidal and no suicidal ideation.  He exhibits abnormal recent memory and abnormal remote memory.    Review of Systems  Constitutional: Positive for malaise/fatigue.   HENT: Negative.   Eyes: Negative.   Respiratory: Negative.   Cardiovascular: Negative.   Gastrointestinal: Negative.   Musculoskeletal: Negative.   Skin: Negative.   Neurological: Positive for focal weakness.  Psychiatric/Behavioral: Positive for depression and memory loss. Negative for hallucinations, substance abuse and suicidal ideas. The patient has insomnia. The patient is not nervous/anxious.     Blood pressure 133/90, pulse 67, temperature 97.8 F (36.6 C), temperature source Oral, resp. rate 20, height '5\' 10"'  (1.778 m), weight 62.9 kg (138 lb 10.7 oz), SpO2 98 %.Body mass index is 19.9 kg/m.  General Appearance: Casual  Eye Contact:  Minimal  Speech:  Slow  Volume:  Decreased  Mood:  Depressed  Affect:  Constricted  Thought Process:  Coherent  Orientation:  Full (Time, Place, and Person)  Thought Content:  Rumination  Suicidal Thoughts:  No  Homicidal Thoughts:  No  Memory:  Immediate;   Fair Recent;   Poor Remote;   Fair  Judgement:  Fair  Insight:  Fair  Psychomotor Activity:  Decreased  Concentration:  Concentration: Fair  Recall:  [employer] of Knowledge:  Fair  Language:  Fair  Akathisia:  No  Handed:  Right  AIMS (if indicated):     Assets:  Desire for Improvement Housing Social Support  ADL's:  Impaired  Cognition:  Impaired,  Mild  Sleep:        Treatment Plan Summary: Daily contact with patient to assess and evaluate symptoms and progress in treatment and Plan This is a 62 year old man who appears to have severe major depression without psychotic features but with a presentation that borders on the catatonic.  Patient is not suicidal but his health has been rapidly worsening at home.  He is losing weight and losing strength and losing the ability to care for himself.  Multiple trials have been made to treat his depression in the past without sustained benefit.  It is my belief that this patient meets criteria for inpatient hospitalization because of  the severe risks to his health from his current depression and the lack of any hope for effective outpatient medication at this point.  I believe he is a good candidate for electroconvulsive therapy.  I spent time with the patient, the grandson and the wife discussing ECT.  The family does not feel that they would be capable of doing outpatient ECT at this point because of the difficulty of even moving the patient up out of bed to bring him into the hospital.  My proposal is that we transfer the patient to the psychiatric ward with a plan to begin ECT treatment probably on Friday.  Patient was slightly ambivalent but not completely opposed.  I suggested that he think about it and talk about it with his family.  I spent time giving him a lot of information about ECT.  I will follow-up tomorrow and if he is agreeable we will try to get him downstairs with a plan to begin ECT by Friday.  Given that he has not tolerated medication in the past and that we are hoping for ECT there is really no use in starting any antidepressant at this point.  Disposition: Recommend psychiatric Inpatient admission when medically cleared.  Alethia Berthold, MD 01/09/2018 5:41 PM

## 2018-01-09 NOTE — Evaluation (Signed)
Physical Therapy Evaluation Patient Details Name: [name] MRN: [id number] DOB: 05-28-1956 Today's Date: 01/09/2018   History of Present Illness  62 y/o male here with c/o severe low back pain.  Per grandson (who pt lives with) he has been unmotivated to do anything over the last several months and has hardly done much more than get out of bed over the last several weeks.    Clinical Impression  Pt initially very slow and guarded with all tasks but with continued activity and encouragement he was able to do more and actually was able to participate with ~10 minutes of gait training apart from the exam with progressively improved cadence and decreased need for walker (secondary, in part, to decreased pain).  He was still slow and guarded t/o the effort but showed functional strength, balance, etc and realizes that he is stronger and more capable than he has recently been "able" to do.  Pt would benefit from continued PT services regardless of setting that he discharges to, family seems to be thinking he will be going to psych rehab on d/c.  PT also suggesting walker for pressure/pain relief in low back during standing/ambulation.    Follow Up Recommendations Home health PT    Equipment Recommendations  Rolling walker with 5" wheels    Recommendations for Other Services       Precautions / Restrictions Restrictions Weight Bearing Restrictions: No      Mobility  Bed Mobility Overal bed mobility: Needs Assistance Bed Mobility: Supine to Sit     Supine to sit: Min guard;Min assist     General bed mobility comments: Pt able to get to sitting with slow labored movement, did need assist to scoot to EOB  Transfers Overall transfer level: Independent Equipment used: Rolling walker (2 wheeled) Transfers: Sit to/from Stand Sit to Stand: Supervision         General transfer comment: Cuing for set up, sequencing and motivation, pt able to rise slowly but  safely  Ambulation/Gait Ambulation/Gait assistance: Supervision Gait Distance (Feet): 200 Feet Assistive device: Rolling walker (2 wheeled);None       General Gait Details: Pt walked the first 50 ft with walker and slow, guarded gait but then was able to continue w/o AD though he did remain somewhat stooped and guarded the the effort. By the end of the effort he was having little to no pain and though still slow looked to have considerably more confidence  Stairs            Wheelchair Mobility    Modified Rankin (Stroke Patients Only)       Balance Overall balance assessment: Modified Independent                                           Pertinent Vitals/Pain Pain Assessment: 0-10 Pain Score: 6  Pain Location: b/l across low back, pt report pain down to near zero with ambulation    Home Living Family/patient expects to be discharged to:: Private residence Living Arrangements: Other relatives(grandson) Available Help at Discharge: Family;Available PRN/intermittently   Home Access: Stairs to enter   Entrance Stairs-Number of Steps: 20   Home Equipment: None      Prior Function Level of Independence: Independent         Comments: Pt has been very weak recently, but not long ago was going out to eat, going  to church, Health visitor [employer]        Extremity/Trunk Assessment   Upper Extremity Assessment Upper Extremity Assessment: Overall WFL for tasks assessed    Lower Extremity Assessment Lower Extremity Assessment: Overall WFL for tasks assessed       Communication   Communication: (slow to answer, more interactive as session progressed)  Cognition Arousal/Alertness: Awake/alert Behavior During Therapy: Flat affect Overall Cognitive Status: Within Functional Limits for tasks assessed                                 General Comments: Pt with low energy, appearing depressed demeanor that did improve t/o session       General Comments      Exercises     Assessment/Plan    PT Assessment Patient needs continued PT services  PT Problem List Decreased strength;Decreased range of motion;Decreased activity tolerance;Decreased balance;Decreased mobility;Decreased coordination;Decreased knowledge of use of DME;Decreased safety awareness;Decreased cognition;Pain       PT Treatment Interventions DME instruction;Functional mobility training;Gait training;Stair training;Therapeutic activities;Therapeutic exercise;Balance training;Neuromuscular re-education;Cognitive remediation;Patient/family education    PT Goals (Current goals can be found in the Care Plan section)  Acute Rehab PT Goals Patient Stated Goal: unsure, family hoping he can do some behavioral/psych rehab PT Goal Formulation: With patient Time For Goal Achievement: 01/23/18 Potential to Achieve Goals: Fair    Frequency Min 2X/week   Barriers to discharge        Co-evaluation               AM-PAC PT "6 Clicks" Daily Activity  Outcome Measure Difficulty turning over in bed (including adjusting bedclothes, sheets and blankets)?: A Lot Difficulty moving from lying on back to sitting on the side of the bed? : A Lot Difficulty sitting down on and standing up from a chair with arms (e.g., wheelchair, bedside commode, etc,.)?: A Little Help needed moving to and from a bed to chair (including a wheelchair)?: A Little Help needed walking in hospital room?: A Little Help needed climbing 3-5 steps with a railing? : A Lot 6 Click Score: 15    End of Session Equipment Utilized During Treatment: Gait belt Activity Tolerance: Patient tolerated treatment well Patient left: in chair;with call bell/phone within reach;with family/visitor present Nurse Communication: Mobility status PT Visit Diagnosis: Muscle weakness (generalized) (M62.81);Difficulty in walking, not elsewhere classified (R26.2)    Time: 0630-1601 PT Time Calculation (min)  (ACUTE ONLY): 31 min   Charges:   PT Evaluation $PT Eval Low Complexity: 1 Low PT Treatments $Gait Training: 8-22 mins   PT G Codes:        Kreg Shropshire, DPT 01/09/2018, 5:38 PM

## 2018-01-09 NOTE — Consult Note (Signed)
Reason for Consult:back pain  Referring Physician: Dr. Posey Pronto   CC: weakness   HPI: [name] is an 62 y.o. male wipresents with complaint of weakness and back pain.  Patient states that he has been so weak over the past couple weeks he has had a hard time getting out of bed.  Pt appears to be depressed.    Past Medical History:  Diagnosis Date  . Depression     Past Surgical History:  Procedure Laterality Date  . HERNIA REPAIR      No family history on file.  Social History:  reports that he has quit smoking. He does not have any smokeless tobacco history on file. His alcohol and drug histories are not on file.  No Known Allergies  Medications: I have reviewed the patient's current medications.  ROS: History obtained from the patient  General ROS: negative for - chills, fatigue, fever, night sweats, weight gain or weight loss Psychological ROS: negative for - behavioral disorder, hallucinations, memory difficulties, mood swings or suicidal ideation Ophthalmic ROS: negative for - blurry vision, double vision, eye pain or loss of vision ENT ROS: negative for - epistaxis, nasal discharge, oral lesions, sore throat, tinnitus or vertigo Allergy and Immunology ROS: negative for - hives or itchy/watery eyes Hematological and Lymphatic ROS: negative for - bleeding problems, bruising or swollen lymph nodes Endocrine ROS: negative for - galactorrhea, hair pattern changes, polydipsia/polyuria or temperature intolerance Respiratory ROS: negative for - cough, hemoptysis, shortness of breath or wheezing Cardiovascular ROS: negative for - chest pain, dyspnea on exertion, edema or irregular heartbeat Gastrointestinal ROS: negative for - abdominal pain, diarrhea, hematemesis, nausea/vomiting or stool incontinence Genito-Urinary ROS: negative for - dysuria, hematuria, incontinence or urinary frequency/urgency Musculoskeletal ROS: negative for - joint swelling or muscular  weakness Neurological ROS: as noted in HPI Dermatological ROS: negative for rash and skin lesion changes  Physical Examination: Blood pressure 133/90, pulse 67, temperature 97.8 F (36.6 C), temperature source Oral, resp. rate 20, height '5\' 10"'  (1.778 m), weight 138 lb 10.7 oz (62.9 kg), SpO2 98 %.   Neurological Examination   Mental Status: Alert, oriented t name and location Mask like face and slow to respond.   Cranial Nerves: II: Discs flat bilaterally; Visual fields grossly normal, pupils equal, round, reactive to light and accommodation III,IV, VI: ptosis not present, extra-ocular motions intact bilaterally V,VII: smile symmetric, facial light touch sensation normal bilaterally VIII: hearing normal bilaterally IX,X: gag reflex present XI: bilateral shoulder shrug XII: midline tongue extension Motor: Right : Upper extremity   5/5    Left:     Upper extremity   5/5  Lower extremity   4/5     Lower extremity   4/5 Tone and bulk:normal tone throughout; no atrophy noted Sensory: Pinprick and light touch intact throughout, bilaterally Deep Tendon Reflexes: 1+ and symmetric throughout Plantars: Right: downgoing   Left: downgoing Cerebellar: Not tested  Gait: not tested      Laboratory Studies:   Basic Metabolic Panel: Recent Labs  Lab 01/08/18 1702 01/09/18 0036  NA 138 138  K 3.7 3.6  CL 103 102  CO2 27 27  GLUCOSE 117* 80  BUN 17 14  CREATININE 0.90 0.78  CALCIUM 8.7* 8.5*    Liver Function Tests: Recent Labs  Lab 01/08/18 1702  AST 291*  ALT 93*  ALKPHOS 50  BILITOT 2.0*  PROT 7.0  ALBUMIN 3.8   No results for input(s): LIPASE, AMYLASE in the last 168 hours.  No results for input(s): AMMONIA in the last 168 hours.  CBC: Recent Labs  Lab 01/08/18 1702 01/09/18 0036  WBC 6.2 6.4  HGB 14.0 13.0  HCT 40.0 37.9*  MCV 91.3 91.9  PLT 190 186    Cardiac Enzymes: Recent Labs  Lab 01/08/18 1702 01/09/18 0030  CKTOTAL  --  7,009*  TROPONINI  <0.03  --     BNP: Invalid input(s): POCBNP  CBG: No results for input(s): GLUCAP in the last 168 hours.  Microbiology: No results found for this or any previous visit.  Coagulation Studies: No results for input(s): LABPROT, INR in the last 72 hours.  Urinalysis:  Recent Labs  Lab 01/08/18 1847  COLORURINE YELLOW*  LABSPEC 1.011  PHURINE 6.0  GLUCOSEU NEGATIVE  HGBUR SMALL*  BILIRUBINUR NEGATIVE  KETONESUR 5*  PROTEINUR NEGATIVE  NITRITE NEGATIVE  LEUKOCYTESUR NEGATIVE    Lipid Panel:  No results found for: CHOL, TRIG, HDL, CHOLHDL, VLDL, LDLCALC  HgbA1C: No results found for: HGBA1C  Urine Drug Screen:  No results found for: LABOPIA, COCAINSCRNUR, LABBENZ, AMPHETMU, THCU, LABBARB  Alcohol Level:  Recent Labs  Lab 01/08/18 [place] <10    Other results: EKG: normal EKG, normal sinus rhythm, unchanged from previous tracings.  Imaging: Ct Head Wo Contrast  Result Date: 01/08/2018 CLINICAL DATA:  Weakness, back pain for 4 days, no injury. EXAM: CT HEAD WITHOUT CONTRAST TECHNIQUE: Contiguous axial images were obtained from the base of the skull through the vertex without intravenous contrast. COMPARISON:  05/01/2006. FINDINGS: Brain: No evidence for acute infarction, hemorrhage, mass lesion, hydrocephalus, or extra-axial fluid. Mild atrophy. Hypoattenuation of white matter, likely small vessel disease. Vascular: No hyperdense vessel or unexpected calcification. Skull: Normal. Negative for fracture or focal lesion. Sinuses/Orbits: No acute finding. Other: None. IMPRESSION: Mild atrophy. No acute intracranial findings. Progression of cerebral volume loss since 2007. Electronically Signed   By: Staci Righter M.D.   On: 01/08/2018 19:10   Mr Lumbar Spine Wo Contrast  Result Date: 01/08/2018 CLINICAL DATA:  Initial evaluation for generalized weakness with low back pain for 4 days. EXAM: MRI LUMBAR SPINE WITHOUT CONTRAST TECHNIQUE: Multiplanar, multisequence MR imaging of  the lumbar spine was performed. No intravenous contrast was administered. COMPARISON:  None. FINDINGS: Segmentation: Normal segmentation. Lowest well-formed disc labeled the L5-S1 level. Alignment: Trace retrolisthesis of L2 on L3. Vertebral bodies otherwise normally aligned with preservation of the normal lumbar lordosis. Vertebrae: Vertebral body heights maintained without evidence for acute or chronic fracture. Bone marrow signal intensity within normal limits. No discrete or worrisome osseous lesions. Chronic reactive endplate changes present about the L5-S1 interspace. Conus medullaris and cauda equina: Conus extends to the T12 level. Conus and cauda equina appear normal. Paraspinal and other soft tissues: Mild paraspinous edema within the lower posterior paraspinous musculature, which could reflect myositis and/or muscular injury/strain. Subcentimeter T2 hyperintense cyst noted within the right kidney. Visualized visceral structures otherwise unremarkable. Disc levels: L1-2: Chronic intervertebral disc space narrowing without significant disc bulge. No stenosis. L2-3: Trace retrolisthesis. Mild chronic intervertebral disc space narrowing with diffuse disc bulge and disc desiccation. Mild facet hypertrophy. No stenosis. L3-4: Mild diffuse disc bulge with disc desiccation. Mild facet ligament flavum hypertrophy. Superimposed shallow right foraminal disc protrusion contacts the exiting right L3 nerve root (series 3, image 3). No significant spinal stenosis. Mild to moderate right L3 foraminal narrowing. L4-5: Mild diffuse disc bulge. Mild facet hypertrophy. No significant spinal stenosis. Mild bilateral L4 foraminal narrowing, slightly greater on the  right. L5-S1: Chronic intervertebral disc space narrowing with diffuse disc bulge and disc desiccation. Mild facet hypertrophy, greater on the right. No significant spinal stenosis. Mild to moderate left L5 foraminal stenosis. IMPRESSION: 1. No evidence for cord  compression or cauda equina. 2. Soft tissue edema involving the posterior paraspinous musculature bilaterally, right slightly greater than left. Finding is of uncertain etiology or significance, but could reflect acute myositis or possibly muscular injury/strain. Correlation with history and CPK suggested. 3. Shallow right foraminal disc protrusion at L3-4, contacting and potentially affecting the right L3 nerve root. 4. Disc bulge with facet hypertrophy at L4-5 and L5-S1 with resultant mild bilateral L4 with mild to moderate left L5 foraminal stenosis. Electronically Signed   By: Jeannine Boga M.D.   On: 01/08/2018 22:05     Assessment/Plan:  62 y.o. male wipresents with complaint of weakness and back pain.  Patient states that he has been so weak over the past couple weeks he has had a hard time getting out of bed.  Pt appears to be depressed.    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 Prednisone taper started and NSAIDs as needed   01/09/2018, 1:13 PM

## 2018-01-09 NOTE — Clinical Social Work Note (Signed)
CSW consulted for "Behavioral health issues impacting hospitalization/discharge". CSW spoke with nursing staff regarding patient. Per staff, patient is not exhibiting behaviors, he is on suicide precautions and has a Actuary. Patient also has a psych consult pending. CSW signing off. Please re consult if further needs arise.   [place], Carmine

## 2018-01-09 NOTE — Care Management Note (Signed)
Case Management Note  Patient Details  Name: [name] MRN: [id number] Date of Birth: March 04, 1956  Subjective/Objective:   Admitted to [facility] with the diagnosis of back pain. Nanci Pina, lives in the home. [phone]). Last seen Dr. Rutherford Nail at [facility] 2010. Prescriptions can be filled at [facility]. No home Health. No skilled facility. No medical equipment in the home. Got tired and just sit down 3-4 days ago. "Just sat there a hour or 2." Decreased appetite. Weight in 2010 was over 200 pounds, now 132.  Yolanda Bonine will transport Sitter at the bedside.                 Action/Plan: Will continue to follow for plans   Expected Discharge Date:                  Expected Discharge Plan:     In-House Referral:     Discharge planning Services     Post Acute Care Choice:    Choice offered to:     DME Arranged:    DME Agency:     HH Arranged:    HH Agency:     Status of Service:     If discussed at [employer] of Stay Meetings, dates discussed:    Additional Comments:  Shelbie Ammons, RN MSN CCM Care Management [phone] 01/09/2018, 11:17 AM

## 2018-01-09 NOTE — Progress Notes (Signed)
[place] at [facility]                                                                                                                                                                                  Patient Demographics   [name], is a 62 y.o. male, DOB - [birth year], QIO:[id number]  Admit date - 01/08/2018   Admitting Physician Lance Coon, MD  Outpatient Primary MD for the patient is [facility] [place]   LOS - 0  Subjective: Patient admitted with back pain difficulty with walking and weight loss Patient also complains  of having depression   Review of Systems:   CONSTITUTIONAL: No documented fever.  Positive fatigue, positive weakness. No weight gain, no weight loss.  EYES: No blurry or double vision.  ENT: No tinnitus. No postnasal drip. No redness of the oropharynx.  RESPIRATORY: No cough, no wheeze, no hemoptysis. No dyspnea.  CARDIOVASCULAR: No chest pain. No orthopnea. No palpitations. No syncope.  GASTROINTESTINAL: No nausea, no vomiting or diarrhea. No abdominal pain. No melena or hematochezia.  GENITOURINARY: No dysuria or hematuria.  ENDOCRINE: No polyuria or nocturia. No heat or cold intolerance.  HEMATOLOGY: No anemia. No bruising. No bleeding.  INTEGUMENTARY: No rashes. No lesions.  MUSCULOSKELETAL: No arthritis. No swelling. No gout.  Positive back pain NEUROLOGIC: No numbness, tingling, or ataxia. No seizure-type activity.  PSYCHIATRIC: No anxiety. No insomnia. No ADD.    Vitals:   Vitals:   01/08/18 2300 01/08/18 2349 01/09/18 0506 01/09/18 0810  BP: (!) 143/96 (!) 150/87 120/82 133/90  Pulse: 67 67 63 67  Resp:  18 16 20   Temp:  97.9 F (36.6 C) 98.2 F (36.8 C) 97.8 F (36.6 C)  TempSrc:  Oral Oral Oral  SpO2: 99% 100% 98% 98%  Weight:  62.9 kg (138 lb 10.7 oz)    Height:  5\' 10"  (1.778 m)      Wt Readings from Last 3 Encounters:  01/08/18 62.9 kg (138 lb 10.7 oz)     Intake/Output Summary (Last 24  hours) at 01/09/2018 1427 Last data filed at 01/09/2018 1424 Gross per 24 hour  Intake 480 ml  Output -  Net 480 ml    Physical Exam:   GENERAL: Pleasant-appearing in no apparent distress.  HEAD, EYES, EARS, NOSE AND THROAT: Atraumatic, normocephalic. Extraocular muscles are intact. Pupils equal and reactive to light. Sclerae anicteric. No conjunctival injection. No oro-pharyngeal erythema.  NECK: Supple. There is no jugular venous distention. No bruits, no lymphadenopathy, no thyromegaly.  HEART: Regular rate and rhythm,. No murmurs, no rubs, no clicks.  LUNGS: Clear to auscultation bilaterally. No rales or rhonchi. No wheezes.  ABDOMEN: Soft, flat, nontender, nondistended. Has good bowel sounds. No hepatosplenomegaly appreciated.  EXTREMITIES: No evidence of any cyanosis, clubbing, or peripheral edema.  +2 pedal and radial pulses bilaterally.  NEUROLOGIC: The patient is alert, awake, and oriented x3 with no focal motor or sensory deficits appreciated bilaterally.  SKIN: Moist and warm with no rashes appreciated.  Psych: Not anxious, depressed LN: No inguinal LN enlargement    Antibiotics   Anti-infectives (From admission, onward)   None      Medications   Scheduled Meds: . enoxaparin (LOVENOX) injection  40 mg Subcutaneous Q24H  . feeding supplement (ENSURE ENLIVE)  237 mL Oral BID BM  . methylPREDNISolone  4 mg Oral PC lunch  . methylPREDNISolone  4 mg Oral PC supper  . [START ON 01/10/2018] methylPREDNISolone  4 mg Oral 3 x daily with food  . [START ON 01/11/2018] methylPREDNISolone  4 mg Oral 4X daily taper  . methylPREDNISolone  8 mg Oral Nightly  . [START ON 01/10/2018] methylPREDNISolone  8 mg Oral Nightly  . [START ON 01/10/2018] multivitamin with minerals  1 tablet Oral Daily   Continuous Infusions: . sodium chloride 125 mL/hr at 01/09/18 1211   PRN Meds:.acetaminophen **OR** acetaminophen, ibuprofen, ondansetron **OR** ondansetron (ZOFRAN) IV   Data Review:   Micro  Results No results found for this or any previous visit (from the past 240 hour(s)).  Radiology Reports Ct Head Wo Contrast  Result Date: 01/08/2018 CLINICAL DATA:  Weakness, back pain for 4 days, no injury. EXAM: CT HEAD WITHOUT CONTRAST TECHNIQUE: Contiguous axial images were obtained from the base of the skull through the vertex without intravenous contrast. COMPARISON:  05/01/2006. FINDINGS: Brain: No evidence for acute infarction, hemorrhage, mass lesion, hydrocephalus, or extra-axial fluid. Mild atrophy. Hypoattenuation of white matter, likely small vessel disease. Vascular: No hyperdense vessel or unexpected calcification. Skull: Normal. Negative for fracture or focal lesion. Sinuses/Orbits: No acute finding. Other: None. IMPRESSION: Mild atrophy. No acute intracranial findings. Progression of cerebral volume loss since 2007. Electronically Signed   By: Staci Righter M.D.   On: 01/08/2018 19:10   Mr Lumbar Spine Wo Contrast  Result Date: 01/08/2018 CLINICAL DATA:  Initial evaluation for generalized weakness with low back pain for 4 days. EXAM: MRI LUMBAR SPINE WITHOUT CONTRAST TECHNIQUE: Multiplanar, multisequence MR imaging of the lumbar spine was performed. No intravenous contrast was administered. COMPARISON:  None. FINDINGS: Segmentation: Normal segmentation. Lowest well-formed disc labeled the L5-S1 level. Alignment: Trace retrolisthesis of L2 on L3. Vertebral bodies otherwise normally aligned with preservation of the normal lumbar lordosis. Vertebrae: Vertebral body heights maintained without evidence for acute or chronic fracture. Bone marrow signal intensity within normal limits. No discrete or worrisome osseous lesions. Chronic reactive endplate changes present about the L5-S1 interspace. Conus medullaris and cauda equina: Conus extends to the T12 level. Conus and cauda equina appear normal. Paraspinal and other soft tissues: Mild paraspinous edema within the lower posterior paraspinous  musculature, which could reflect myositis and/or muscular injury/strain. Subcentimeter T2 hyperintense cyst noted within the right kidney. Visualized visceral structures otherwise unremarkable. Disc levels: L1-2: Chronic intervertebral disc space narrowing without significant disc bulge. No stenosis. L2-3: Trace retrolisthesis. Mild chronic intervertebral disc space narrowing with diffuse disc bulge and disc desiccation. Mild facet hypertrophy. No stenosis. L3-4: Mild diffuse disc bulge with disc desiccation. Mild facet ligament flavum hypertrophy. Superimposed shallow right foraminal disc protrusion contacts the exiting right L3 nerve root (series 3, image 3). No significant spinal stenosis. Mild to moderate right L3  foraminal narrowing. L4-5: Mild diffuse disc bulge. Mild facet hypertrophy. No significant spinal stenosis. Mild bilateral L4 foraminal narrowing, slightly greater on the right. L5-S1: Chronic intervertebral disc space narrowing with diffuse disc bulge and disc desiccation. Mild facet hypertrophy, greater on the right. No significant spinal stenosis. Mild to moderate left L5 foraminal stenosis. IMPRESSION: 1. No evidence for cord compression or cauda equina. 2. Soft tissue edema involving the posterior paraspinous musculature bilaterally, right slightly greater than left. Finding is of uncertain etiology or significance, but could reflect acute myositis or possibly muscular injury/strain. Correlation with history and CPK suggested. 3. Shallow right foraminal disc protrusion at L3-4, contacting and potentially affecting the right L3 nerve root. 4. Disc bulge with facet hypertrophy at L4-5 and L5-S1 with resultant mild bilateral L4 with mild to moderate left L5 foraminal stenosis. Electronically Signed   By: Jeannine Boga M.D.   On: 01/08/2018 22:05     CBC Recent Labs  Lab 01/08/18 1702 01/09/18 0036  WBC 6.2 6.4  HGB 14.0 13.0  HCT 40.0 37.9*  PLT 190 186  MCV 91.3 91.9  MCH 32.0  31.4  MCHC 35.1 34.2  RDW 13.0 12.9    Chemistries  Recent Labs  Lab 01/08/18 1702 01/09/18 0036  NA 138 138  K 3.7 3.6  CL 103 102  CO2 27 27  GLUCOSE 117* 80  BUN 17 14  CREATININE 0.90 0.78  CALCIUM 8.7* 8.5*  AST 291*  --   ALT 93*  --   ALKPHOS 50  --   BILITOT 2.0*  --    ------------------------------------------------------------------------------------------------------------------ estimated creatinine clearance is 84.1 mL/min (by C-G formula based on SCr of 0.78 mg/dL). ------------------------------------------------------------------------------------------------------------------ No results for input(s): HGBA1C in the last 72 hours. ------------------------------------------------------------------------------------------------------------------ No results for input(s): CHOL, HDL, LDLCALC, TRIG, CHOLHDL, LDLDIRECT in the last 72 hours. ------------------------------------------------------------------------------------------------------------------ Recent Labs    01/08/18 1702  TSH 2.553   ------------------------------------------------------------------------------------------------------------------ Recent Labs    01/09/18 0948  VITAMINB12 145*    Coagulation profile No results for input(s): INR, PROTIME in the last 168 hours.  No results for input(s): DDIMER in the last 72 hours.  Cardiac Enzymes Recent Labs  Lab 01/08/18 1702  TROPONINI <0.03   ------------------------------------------------------------------------------------------------------------------ Invalid input(s): POCBNP    Assessment & Plan  Patient is a 62 year old presenting with back pain   1. Back pain -MRI showed muscular inflammation likely causing the patient's back pain.    Possibly due to myositis discussed with neurology will try some Medrol Dosepak 2.  Elevated CPK likely due to myositis we will give him some IV fluids follow CPK,  sed rate was checked which is  normal, polymyositis is being ruled out with blood test 3.  Shuffling gait -seen by neurology they do not feel he has Parkinson's 4.  Depression psych evaluation   Obtain PT evaluation     Code Status Orders  (From admission, onward)        Start     Ordered   01/08/18 2351  Full code  Continuous     01/08/18 2350    Code Status History    This patient has a current code status but no historical code status.           Consults  Neuro,pchy   DVT Prophylaxis  Lovenox   Lab Results  Component Value Date   PLT 186 01/09/2018     Time Spent in minutes   20min  Greater than 50% of time spent in care  coordination and counseling patient regarding the condition and plan of care.   Dustin Flock M.D on 01/09/2018 at 2:27 PM  Between 7am to 6pm - Pager - [phone]  After 6pm go to [email or web address] - Proofreader  [employer]   Office  [phone]

## 2018-01-10 LAB — CK
Total CK: 2427 U/L — ABNORMAL HIGH (ref 49–397)
Total CK: 1763 U/L — ABNORMAL HIGH (ref 49–397)
Total CK: 1673 U/L — ABNORMAL HIGH (ref 49–397)

## 2018-01-10 LAB — CBC
HCT: 37.5 % — ABNORMAL LOW (ref 40.0–52.0)
Hemoglobin: 13 g/dL (ref 13.0–18.0)
MCH: 31.7 pg (ref 26.0–34.0)
MCHC: 34.6 g/dL (ref 32.0–36.0)
MCV: 91.6 fL (ref 80.0–100.0)
Platelets: 189 10*3/uL (ref 150–440)
RBC: 4.09 MIL/uL — ABNORMAL LOW (ref 4.40–5.90)
RDW: 12.5 % (ref 11.5–14.5)
WBC: 5.3 10*3/uL (ref 3.8–10.6)

## 2018-01-10 LAB — T3: T3 TOTAL: 95 ng/dL (ref 71–180)

## 2018-01-10 LAB — ANTI-JO 1 ANTIBODY, IGG: Anti JO-1: <0.2 AI (ref 0.0–0.9)

## 2018-01-10 LAB — COMPREHENSIVE METABOLIC PANEL
ALT: 73 U/L — ABNORMAL HIGH (ref 0–44)
ANION GAP: 5 (ref 5–15)
AST: 133 U/L — ABNORMAL HIGH (ref 15–41)
Albumin: 3.1 g/dL — ABNORMAL LOW (ref 3.5–5.0)
Alkaline Phosphatase: 42 U/L (ref 38–126)
BUN: 13 mg/dL (ref 8–23)
CHLORIDE: 108 mmol/L (ref 98–111)
CO2: 26 mmol/L (ref 22–32)
CREATININE: 0.72 mg/dL (ref 0.61–1.24)
Calcium: 8.5 mg/dL — ABNORMAL LOW (ref 8.9–10.3)
GFR calc Af Amer: >60 mL/min (ref >60)
GFR calc non Af Amer: >60 mL/min (ref >60)
GLUCOSE: 132 mg/dL — AB (ref 70–99)
POTASSIUM: 4 mmol/L (ref 3.5–5.1)
SODIUM: 139 mmol/L (ref 135–145)
Total Bilirubin: 1.1 mg/dL (ref 0.3–1.2)
Total Protein: 6.1 g/dL — ABNORMAL LOW (ref 6.5–8.1)

## 2018-01-10 LAB — ANA COMPREHENSIVE PANEL
Centromere Ab Screen: <0.2 AI (ref 0.0–0.9)
ENA SM Ab Ser-aCnc: <0.2 AI (ref 0.0–0.9)
Ribonucleic Protein: <0.2 AI (ref 0.0–0.9)
SSA (Ro) (ENA) Antibody, IgG: <0.2 AI (ref 0.0–0.9)
SSB (La) (ENA) Antibody, IgG: <0.2 AI (ref 0.0–0.9)

## 2018-01-10 LAB — HIV ANTIBODY (ROUTINE TESTING W REFLEX): HIV SCREEN 4TH GENERATION: NONREACTIVE

## 2018-01-10 LAB — VITAMIN D 25 HYDROXY (VIT D DEFICIENCY, FRACTURES): Vit D, 25-Hydroxy: 4.5 ng/mL — ABNORMAL LOW (ref 30.0–100.0)

## 2018-01-10 LAB — HEPATITIS PANEL, ACUTE
HCV Ab: <0.1 s/co ratio (ref 0.0–0.9)
Hep A IgM: NEGATIVE
Hep B C IgM: NEGATIVE
Hepatitis B Surface Ag: NEGATIVE

## 2018-01-10 MED ORDER — CYANOCOBALAMIN 1000 MCG/ML IJ SOLN
1000.0000 ug | Freq: Once | INTRAMUSCULAR | Status: AC
  Administered 2018-01-10: 1000 ug via INTRAMUSCULAR
  Filled 2018-01-10: qty 1

## 2018-01-10 MED ORDER — VITAMIN D (ERGOCALCIFEROL) 1.25 MG (50000 UNIT) PO CAPS
50000.0000 [IU] | ORAL_CAPSULE | ORAL | Status: DC
  Administered 2018-01-10: 12:00:00 50000 [IU] via ORAL
  Filled 2018-01-10: qty 1

## 2018-01-10 NOTE — Progress Notes (Signed)
   01/10/18 1400  Clinical Encounter Type  Visited With Patient  Visit Type Follow-up;Psychological support;Spiritual support  Referral From Nurse  Consult/Referral To Chaplain  Spiritual Encounters  Spiritual Needs Emotional;Other (Comment)   [place] stopped by PT's RM to follow up with PT from yesterdays encounter. Patient still seems deeply depressed but I did manage to get a few smiles out of him. He was sitting with a eating utensil in his hand but had made no progress with his food. His hand appeared to be in curled position which is abnormal if using hand to eat. PT did get some of my humor and asked the [facility] to come back and see him again. Ch wondered if PT needs help feeding himself. CH will follow up.

## 2018-01-10 NOTE — Progress Notes (Signed)
[place] at [facility]                                                                                                                                                                                  Patient Demographics   [name], is a 62 y.o. male, DOB - 1956-03-31, JYN:[id number]  Admit date - 01/08/2018   Admitting Physician Lance Coon, MD  Outpatient Primary MD for the patient is [facility] [place]   LOS - 0  Subjective: Pt still c/o pain in back and difficulty with walking and weakness    Review of Systems:   CONSTITUTIONAL: No documented fever.  Positive fatigue, positive weakness. No weight gain, no weight loss.  EYES: No blurry or double vision.  ENT: No tinnitus. No postnasal drip. No redness of the oropharynx.  RESPIRATORY: No cough, no wheeze, no hemoptysis. No dyspnea.  CARDIOVASCULAR: No chest pain. No orthopnea. No palpitations. No syncope.  GASTROINTESTINAL: No nausea, no vomiting or diarrhea. No abdominal pain. No melena or hematochezia.  GENITOURINARY: No dysuria or hematuria.  ENDOCRINE: No polyuria or nocturia. No heat or cold intolerance.  HEMATOLOGY: No anemia. No bruising. No bleeding.  INTEGUMENTARY: No rashes. No lesions.  MUSCULOSKELETAL: No arthritis. No swelling. No gout.  Positive back pain NEUROLOGIC: No numbness, tingling, or ataxia. No seizure-type activity.  PSYCHIATRIC: No anxiety. No insomnia. No ADD.    Vitals:   Vitals:   01/09/18 0506 01/09/18 0810 01/09/18 2045 01/10/18 0516  BP: 120/82 133/90 138/86 (!) 157/94  Pulse: 63 67 74 62  Resp: 16 20 18 16   Temp: 98.2 F (36.8 C) 97.8 F (36.6 C) 97.8 F (36.6 C) 97.7 F (36.5 C)  TempSrc: Oral Oral Oral Oral  SpO2: 98% 98% 98% 98%  Weight:      Height:        Wt Readings from Last 3 Encounters:  01/08/18 62.9 kg (138 lb 10.7 oz)     Intake/Output Summary (Last 24 hours) at 01/10/2018 1338 Last data filed at 01/10/2018 0415 Gross per 24  hour  Intake 2547.08 ml  Output 1050 ml  Net 1497.08 ml    Physical Exam:   GENERAL: Pleasant-appearing in no apparent distress.  HEAD, EYES, EARS, NOSE AND THROAT: Atraumatic, normocephalic. Extraocular muscles are intact. Pupils equal and reactive to light. Sclerae anicteric. No conjunctival injection. No oro-pharyngeal erythema.  NECK: Supple. There is no jugular venous distention. No bruits, no lymphadenopathy, no thyromegaly.  HEART: Regular rate and rhythm,. No murmurs, no rubs, no clicks.  LUNGS: Clear to auscultation bilaterally. No rales or rhonchi. No wheezes.  ABDOMEN: Soft, flat, nontender, nondistended. Has good bowel sounds.  No hepatosplenomegaly appreciated.  EXTREMITIES: No evidence of any cyanosis, clubbing, or peripheral edema.  +2 pedal and radial pulses bilaterally.  NEUROLOGIC: The patient is alert, awake, and oriented x3 with no focal motor or sensory deficits appreciated bilaterally.  SKIN: Moist and warm with no rashes appreciated.  Psych: Not anxious, depressed LN: No inguinal LN enlargement    Antibiotics   Anti-infectives (From admission, onward)   None      Medications   Scheduled Meds: . enoxaparin (LOVENOX) injection  40 mg Subcutaneous Q24H  . feeding supplement (ENSURE ENLIVE)  237 mL Oral BID BM  . methylPREDNISolone  4 mg Oral 3 x daily with food  . [START ON 01/11/2018] methylPREDNISolone  4 mg Oral 4X daily taper  . methylPREDNISolone  8 mg Oral Nightly  . multivitamin with minerals  1 tablet Oral Daily  . Vitamin D (Ergocalciferol)  50,000 Units Oral Q7 days   Continuous Infusions: . sodium chloride 75 mL/hr at 01/10/18 0710   PRN Meds:.acetaminophen **OR** acetaminophen, ibuprofen, ondansetron **OR** ondansetron (ZOFRAN) IV   Data Review:   Micro Results No results found for this or any previous visit (from the past 240 hour(s)).  Radiology Reports Ct Head Wo Contrast  Result Date: 01/08/2018 CLINICAL DATA:  Weakness, back pain  for 4 days, no injury. EXAM: CT HEAD WITHOUT CONTRAST TECHNIQUE: Contiguous axial images were obtained from the base of the skull through the vertex without intravenous contrast. COMPARISON:  05/01/2006. FINDINGS: Brain: No evidence for acute infarction, hemorrhage, mass lesion, hydrocephalus, or extra-axial fluid. Mild atrophy. Hypoattenuation of white matter, likely small vessel disease. Vascular: No hyperdense vessel or unexpected calcification. Skull: Normal. Negative for fracture or focal lesion. Sinuses/Orbits: No acute finding. Other: None. IMPRESSION: Mild atrophy. No acute intracranial findings. Progression of cerebral volume loss since 2007. Electronically Signed   By: Staci Righter M.D.   On: 01/08/2018 19:10   Mr Lumbar Spine Wo Contrast  Result Date: 01/08/2018 CLINICAL DATA:  Initial evaluation for generalized weakness with low back pain for 4 days. EXAM: MRI LUMBAR SPINE WITHOUT CONTRAST TECHNIQUE: Multiplanar, multisequence MR imaging of the lumbar spine was performed. No intravenous contrast was administered. COMPARISON:  None. FINDINGS: Segmentation: Normal segmentation. Lowest well-formed disc labeled the L5-S1 level. Alignment: Trace retrolisthesis of L2 on L3. Vertebral bodies otherwise normally aligned with preservation of the normal lumbar lordosis. Vertebrae: Vertebral body heights maintained without evidence for acute or chronic fracture. Bone marrow signal intensity within normal limits. No discrete or worrisome osseous lesions. Chronic reactive endplate changes present about the L5-S1 interspace. Conus medullaris and cauda equina: Conus extends to the T12 level. Conus and cauda equina appear normal. Paraspinal and other soft tissues: Mild paraspinous edema within the lower posterior paraspinous musculature, which could reflect myositis and/or muscular injury/strain. Subcentimeter T2 hyperintense cyst noted within the right kidney. Visualized visceral structures otherwise unremarkable.  Disc levels: L1-2: Chronic intervertebral disc space narrowing without significant disc bulge. No stenosis. L2-3: Trace retrolisthesis. Mild chronic intervertebral disc space narrowing with diffuse disc bulge and disc desiccation. Mild facet hypertrophy. No stenosis. L3-4: Mild diffuse disc bulge with disc desiccation. Mild facet ligament flavum hypertrophy. Superimposed shallow right foraminal disc protrusion contacts the exiting right L3 nerve root (series 3, image 3). No significant spinal stenosis. Mild to moderate right L3 foraminal narrowing. L4-5: Mild diffuse disc bulge. Mild facet hypertrophy. No significant spinal stenosis. Mild bilateral L4 foraminal narrowing, slightly greater on the right. L5-S1: Chronic intervertebral disc space narrowing with diffuse disc  bulge and disc desiccation. Mild facet hypertrophy, greater on the right. No significant spinal stenosis. Mild to moderate left L5 foraminal stenosis. IMPRESSION: 1. No evidence for cord compression or cauda equina. 2. Soft tissue edema involving the posterior paraspinous musculature bilaterally, right slightly greater than left. Finding is of uncertain etiology or significance, but could reflect acute myositis or possibly muscular injury/strain. Correlation with history and CPK suggested. 3. Shallow right foraminal disc protrusion at L3-4, contacting and potentially affecting the right L3 nerve root. 4. Disc bulge with facet hypertrophy at L4-5 and L5-S1 with resultant mild bilateral L4 with mild to moderate left L5 foraminal stenosis. Electronically Signed   By: Jeannine Boga M.D.   On: 01/08/2018 22:05     CBC Recent Labs  Lab 01/08/18 1702 01/09/18 0036 01/10/18 0446  WBC 6.2 6.4 5.3  HGB 14.0 13.0 13.0  HCT 40.0 37.9* 37.5*  PLT 190 186 189  MCV 91.3 91.9 91.6  MCH 32.0 31.4 31.7  MCHC 35.1 34.2 34.6  RDW 13.0 12.9 12.5    Chemistries  Recent Labs  Lab 01/08/18 1702 01/09/18 0036 01/10/18 0446  NA 138 138 139  K  3.7 3.6 4.0  CL 103 102 108  CO2 27 27 26   GLUCOSE 117* 80 132*  BUN 17 14 13   CREATININE 0.90 0.78 0.72  CALCIUM 8.7* 8.5* 8.5*  AST 291*  --  133*  ALT 93*  --  73*  ALKPHOS 50  --  42  BILITOT 2.0*  --  1.1   ------------------------------------------------------------------------------------------------------------------ estimated creatinine clearance is 84.1 mL/min (by C-G formula based on SCr of 0.72 mg/dL). ------------------------------------------------------------------------------------------------------------------ No results for input(s): HGBA1C in the last 72 hours. ------------------------------------------------------------------------------------------------------------------ No results for input(s): CHOL, HDL, LDLCALC, TRIG, CHOLHDL, LDLDIRECT in the last 72 hours. ------------------------------------------------------------------------------------------------------------------ Recent Labs    01/08/18 1702  TSH 2.553   ------------------------------------------------------------------------------------------------------------------ Recent Labs    01/09/18 0948  VITAMINB12 145*    Coagulation profile No results for input(s): INR, PROTIME in the last 168 hours.  No results for input(s): DDIMER in the last 72 hours.  Cardiac Enzymes Recent Labs  Lab 01/08/18 1702  TROPONINI <0.03   ------------------------------------------------------------------------------------------------------------------ Invalid input(s): POCBNP    Assessment & Plan  Patient is a 62 year old presenting with back pain   1. Back pain -MRI showed muscular inflammation likely causing the patient's back pain.    Possibly due to myositis discussed with neurology  Continue Medrol Dosepak 2.  Elevated CPK likely due to myositis CPK trending down continue IV fluids ,  sed rate was checked which is normal, polymyositis is being ruled out with blood test 3.  Shuffling gait -seen by  neurology they do not feel he has Parkinson's 4.  Depression psych evaluation recommended inpatient psychiatric admission 5.  Severe vitamin D deficiency we will start patient on vitamin D replacement 6.  Severe vitamin B12 deficiency we will start patient on vitamin B12 supplement    Patient will be able to go to psychiatric ward tomorrow     Code Status Orders  (From admission, onward)        Start     Ordered   01/08/18 2351  Full code  Continuous     01/08/18 2350    Code Status History    This patient has a current code status but no historical code status.           Consults  Neuro,pchy   DVT Prophylaxis  Lovenox   Lab Results  Component Value Date   PLT 189 01/10/2018     Time Spent in minutes   89min  Greater than 50% of time spent in care coordination and counseling patient regarding the condition and plan of care.   Dustin Flock M.D on 01/10/2018 at 1:38 PM  Between 7am to 6pm - Pager - [phone]  After 6pm go to [email or web address] - Proofreader  [employer]   Office  [phone]

## 2018-01-10 NOTE — Plan of Care (Signed)
[place] saw today poss transfer  to bm tomorrow. Problem: Spiritual Needs Goal: Ability to function at adequate level Outcome: Progressing   Problem: Education: Goal: Knowledge of warning signs, risks, and behaviors that relate to suicide ideation and self-harm behaviors will improve Outcome: Progressing   Problem: Health Behavior/Discharge (Transition) Planning: Goal: Ability to manage health-related needs will improve Outcome: Progressing   Problem: Clinical Measurements: Goal: Remain free from any harm during hospitalization Outcome: Progressing   Problem: Nutrition: Goal: Adequate fluids and nutrition will be maintained Outcome: Progressing   Problem: Coping: Goal: Ability to disclose and discuss thoughts of suicide and self-harm will improve Outcome: Progressing   Problem: Medication Management: Goal: Adhere to prescribed medication regimen Outcome: Progressing   Problem: Sleep Hygiene: Goal: Ability to obtain adequate restful sleep will improve Outcome: Progressing   Problem: Self Esteem: Goal: Ability to verbalize positive feeling about self will improve Outcome: Progressing   Problem: Education: Goal: Knowledge of General Education information will improve Outcome: Progressing   Problem: Health Behavior/Discharge Planning: Goal: Ability to manage health-related needs will improve Outcome: Progressing   Problem: Clinical Measurements: Goal: Ability to maintain clinical measurements within normal limits will improve Outcome: Progressing Goal: Will remain free from infection Outcome: Progressing Goal: Diagnostic test results will improve Outcome: Progressing Goal: Respiratory complications will improve Outcome: Progressing Goal: Cardiovascular complication will be avoided Outcome: Progressing   Problem: Activity: Goal: Risk for activity intolerance will decrease Outcome: Progressing   Problem: Nutrition: Goal: Adequate nutrition will be  maintained Outcome: Progressing   Problem: Coping: Goal: Level of anxiety will decrease Outcome: Progressing   Problem: Elimination: Goal: Will not experience complications related to bowel motility Outcome: Progressing Goal: Will not experience complications related to urinary retention Outcome: Progressing   Problem: Pain Managment: Goal: General experience of comfort will improve Outcome: Progressing   Problem: Safety: Goal: Ability to remain free from injury will improve Outcome: Progressing   Problem: Skin Integrity: Goal: Risk for impaired skin integrity will decrease Outcome: Progressing

## 2018-01-10 NOTE — Pre-Procedure Instructions (Signed)
This nurse discussed the ECT procedure and answered all the questions of the patient. Yolanda Bonine was present during the visit.ECT booklet and an unsigned copy of the ECT consent was left with the patient and grandson. Before leaving the patient stated he had no more questions for this nurse. Patient understood that his appointment for ECT will be Friday July 5. The patient verbalized understanding that he would be taken to the ECT department Friday morning by a wheelchair and he would be returned back to his room after his procedure recovery.

## 2018-01-10 NOTE — BH Assessment (Signed)
Per Dr. Weber Cooks, patient is to be admitted to [facility] when medically cleared. Writer had patient sign voluntary admission form. Copy giving to [facility] BMU.

## 2018-01-11 ENCOUNTER — Other Ambulatory Visit: Payer: Self-pay | Admitting: Psychiatry

## 2018-01-11 NOTE — Consult Note (Signed)
[place] Psychiatry Consult   Reason for Consult: Follow-up 62 year old man with severe depression bordering on the catatonic Referring Physician: Vianne Bulls Patient Identification: [name] MRN:  [id number] Principal Diagnosis: Severe recurrent major depression without psychotic features [facility]) Diagnosis:   Patient Active Problem List   Diagnosis Date Noted  . Malnutrition of moderate degree [E44.0] 01/09/2018  . Pressure injury of skin [L89.90] 01/09/2018  . Severe recurrent major depression without psychotic features ([place]) [F33.2] 01/09/2018  . Back pain [M54.9] 01/08/2018  . Shuffling gait [R26.89] 01/08/2018  . Depression [F32.9] 01/08/2018    Total Time spent with patient: 30 minutes  Subjective:   [name] is a 62 y.o. male patient admitted with "I am having a hard time".  HPI: See previous notes.  62 year old man with depression evidenced by severe withdrawal anhedonia lack of energy mental shot down hopelessness.  Patient has hardly been moving or eating for months if not years.  Has not tolerated previous attempts to treat depression.  Patient has been seen and I have recommended ECT as an appropriate treatment given the severity of his depression that borders on the catatonic with market inability to care for himself.  Patient has been able to think this through and give appropriate consent.  Family agreeable to the plan.  There is no medical contraindication at this point.  Patient has no new complaints but remains depressed and anxious.  Past Psychiatric History: Past history of depression going on almost 10 years although previous attempts to treat it have failed because of the patient's noncompliance or inability to tolerate medicine  Risk to Self: Is patient at risk for suicide?: Yes Risk to Others:   Prior Inpatient Therapy:   Prior Outpatient Therapy:    Past Medical History:  Past Medical History:  Diagnosis Date  . Depression     Past  Surgical History:  Procedure Laterality Date  . HERNIA REPAIR     Family History: History reviewed. No pertinent family history. Family Psychiatric  History: Unknown Social History:  Social History   Substance and Sexual Activity  Alcohol Use Not on file     Social History   Substance and Sexual Activity  Drug Use Not Currently    Social History   Socioeconomic History  . Marital status: Married    Spouse name: Not on file  . Number of children: Not on file  . Years of education: Not on file  . Highest education level: Not on file  Occupational History  . Not on file  Social Needs  . Financial resource strain: Not on file  . Food insecurity:    Worry: Not on file    Inability: Not on file  . Transportation needs:    Medical: Not on file    Non-medical: Not on file  Tobacco Use  . Smoking status: Former Research scientist (life sciences)  . Smokeless tobacco: Former Systems developer    Quit date: 01/10/2017  Substance and Sexual Activity  . Alcohol use: Not on file  . Drug use: Not Currently  . Sexual activity: Not Currently  Lifestyle  . Physical activity:    Days per week: Not on file    Minutes per session: Not on file  . Stress: Not on file  Relationships  . Social connections:    Talks on phone: Not on file    Gets together: Not on file    Attends religious service: Not on file    Active member of club or organization: Not on  file    Attends meetings of clubs or organizations: Not on file    Relationship status: Not on file  Other Topics Concern  . Not on file  Social History Narrative  . Not on file   Additional Social History:    Allergies:  No Known Allergies  Labs:  Results for orders placed or performed during the hospital encounter of 01/08/18 (from the past 48 hour(s))  CK     Status: Abnormal   Collection Time: 01/10/18  4:46 AM  Result Value Ref Range   Total CK 2,427 (H) 49 - 397 U/L    Comment: Performed at [facility], [address] [place], [place]  [address]  CBC     Status: Abnormal   Collection Time: 01/10/18  4:46 AM  Result Value Ref Range   WBC 5.3 3.8 - 10.6 K/uL   RBC 4.09 (L) 4.40 - 5.90 MIL/uL   Hemoglobin 13.0 13.0 - 18.0 g/dL   HCT 37.5 (L) 40.0 - 52.0 %   MCV 91.6 80.0 - 100.0 fL   MCH 31.7 26.0 - 34.0 pg   MCHC 34.6 32.0 - 36.0 g/dL   RDW 12.5 11.5 - 14.5 %   Platelets 189 150 - 440 K/uL    Comment: Performed at [facility], [address] [place], [place] [address]  Comprehensive metabolic panel     Status: Abnormal   Collection Time: 01/10/18  4:46 AM  Result Value Ref Range   Sodium 139 135 - 145 mmol/L   Potassium 4.0 3.5 - 5.1 mmol/L   Chloride 108 98 - 111 mmol/L    Comment: Please note change in reference range.   CO2 26 22 - 32 mmol/L   Glucose, Bld 132 (H) 70 - 99 mg/dL    Comment: Please note change in reference range.   BUN 13 8 - 23 mg/dL    Comment: Please note change in reference range.   Creatinine, Ser 0.72 0.61 - 1.24 mg/dL   Calcium 8.5 (L) 8.9 - 10.3 mg/dL   Total Protein 6.1 (L) 6.5 - 8.1 g/dL   Albumin 3.1 (L) 3.5 - 5.0 g/dL   AST 133 (H) 15 - 41 U/L   ALT 73 (H) 0 - 44 U/L    Comment: Please note change in reference range.   Alkaline Phosphatase 42 38 - 126 U/L   Total Bilirubin 1.1 0.3 - 1.2 mg/dL   GFR calc non Af Amer >60 >60 mL/min   GFR calc Af Amer >60 >60 mL/min    Comment: (NOTE) The eGFR has been calculated using the CKD EPI equation. This calculation has not been validated in all clinical situations. eGFR's persistently <60 mL/min signify possible Chronic Kidney Disease.    Anion gap 5 5 - 15    Comment: Performed at [facility], [address] [place], [place] [address]  CK     Status: Abnormal   Collection Time: 01/10/18 12:34 PM  Result Value Ref Range   Total CK 1,763 (H) 49 - 397 U/L    Comment: Performed at [facility], [address] [place], [place] [address]  CK     Status: Abnormal   Collection Time: 01/10/18  2:32 PM   Result Value Ref Range   Total CK 1,673 (H) 49 - 397 U/L    Comment: Performed at [facility], [address], [place], [place] [address]    Current Facility-Administered Medications  Medication Dose Route Frequency  Provider Last Rate Last Dose  . 0.9 %  sodium chloride infusion   Intravenous Continuous Epifanio Lesches, MD 150 mL/hr at 01/11/18 1138    . acetaminophen (TYLENOL) tablet 650 mg  650 mg Oral Q6H PRN Lance Coon, MD       Or  . acetaminophen (TYLENOL) suppository 650 mg  650 mg Rectal Q6H PRN Lance Coon, MD      . enoxaparin (LOVENOX) injection 40 mg  40 mg Subcutaneous Q24H Lance Coon, MD   40 mg at 01/10/18 1245  . feeding supplement (ENSURE ENLIVE) (ENSURE ENLIVE) liquid 237 mL  237 mL Oral BID BM Dustin Flock, MD   237 mL at 01/11/18 1001  . ibuprofen (ADVIL,MOTRIN) tablet 600 mg  600 mg Oral Q6H PRN Lance Coon, MD      . methylPREDNISolone (MEDROL DOSEPAK) tablet 4 mg  4 mg Oral 4X daily taper Dustin Flock, MD   4 mg at 01/11/18 8099  . multivitamin with minerals tablet 1 tablet  1 tablet Oral Daily Dustin Flock, MD   1 tablet at 01/11/18 [phone]  . ondansetron (ZOFRAN) tablet 4 mg  4 mg Oral Q6H PRN Lance Coon, MD       Or  . ondansetron [facility]) injection 4 mg  4 mg Intravenous Q6H PRN Lance Coon, MD      . Vitamin D (Ergocalciferol) (DRISDOL) capsule 50,000 Units  50,000 Units Oral Q7 days Dustin Flock, MD   50,000 Units at 01/10/18 1212    Musculoskeletal: Strength & Muscle Tone: decreased Gait & Station: unsteady Patient leans: N/A  Psychiatric Specialty Exam: Physical Exam  Nursing note and vitals reviewed. Constitutional: He appears well-developed and well-nourished.  HENT:  Head: Normocephalic and atraumatic.  Eyes: Pupils are equal, round, and reactive to light. Conjunctivae are normal.  Neck: Normal range of motion.  Cardiovascular: Normal heart sounds.  Respiratory: Effort normal. No respiratory distress.   GI: Soft.  Musculoskeletal: Normal range of motion.  Neurological: He is alert.  Skin: Skin is warm and dry.  Psychiatric: Judgment normal. His affect is blunt. His speech is delayed. He is slowed and withdrawn. Thought content is not paranoid. Cognition and memory are impaired. He expresses no homicidal and no suicidal ideation.    Review of Systems  Constitutional: Positive for malaise/fatigue and weight loss.  HENT: Negative.   Eyes: Negative.   Respiratory: Negative.   Cardiovascular: Negative.   Gastrointestinal: Negative.   Musculoskeletal: Negative.   Skin: Negative.   Neurological: Positive for weakness.  Psychiatric/Behavioral: Positive for depression. Negative for hallucinations, memory loss, substance abuse and suicidal ideas. The patient is nervous/anxious and has insomnia.     Blood pressure (!) 150/95, pulse 79, temperature 98.2 F (36.8 C), temperature source Oral, resp. rate 18, height _0  (1.778 m), weight 62.9 kg (138 lb 10.7 oz), SpO2 100 %.Body mass index is 19.9 kg/m.  General Appearance: Casual  Eye Contact:  Fair  Speech:  Slow  Volume:  Decreased  Mood:  Anxious and Depressed  Affect:  Blunt  Thought Process:  Goal Directed  Orientation:  Full (Time, Place, and Person)  Thought Content:  Logical  Suicidal Thoughts:  Yes.  without intent/plan  Homicidal Thoughts:  No  Memory:  Immediate;   Fair Recent;   Fair Remote;   Fair  Judgement:  Fair  Insight:  Fair  Psychomotor Activity:  Decreased  Concentration:  Concentration: Fair  Recall:  [employer] of Knowledge:  Fair  Language:  Fair  Akathisia:  No  Handed:  Right  AIMS (if indicated):     Assets:  Desire for Improvement Housing Physical Health Social Support  ADL's:  Impaired  Cognition:  Impaired,  Mild  Sleep:        Treatment Plan Summary: Daily contact with patient to assess and evaluate symptoms and progress in treatment, Medication management and Plan Patient is scheduled to  begin ECT treatment with bilateral treatment tomorrow morning.  Nursing has seen him and he has signed consent.  Labs will all be reviewed to make sure we have what we need.  Spoke with hospitalist today.  We have not had an appropriate bed available downstairs but we can begin ECT tomorrow morning and then he can return to the medical unit until we have appropriate psychiatric beds available.  Patient has previously failed to tolerate medications so we will defer starting any antidepressant given that we are starting ECT.  Patient understands and agrees to plan.  Disposition: Recommend psychiatric Inpatient admission when medically cleared. Electroconvulsive therapy  Alethia Berthold, MD 01/11/2018 2:10 PM

## 2018-01-11 NOTE — Progress Notes (Signed)
[place] at [facility]                                                                                                                                                                                  Patient Demographics   [name], is a 62 y.o. male, DOB - 06-25-1956, TOI:[id number]  Admit date - 01/08/2018   Admitting Physician Lance Coon, MD  Outpatient Primary MD for the patient is [facility]   LOS - 0  Subjective: Pt still c/o pain in back and difficulty with walking and weakness.  Patient affect is flat, has poor p.o. intake secondary to underlying depression    Review of Systems:   CONSTITUTIONAL: No documented fever.  Positive fatigue, positive weakness. No weight gain, no weight loss.  EYES: No blurry or double vision.  ENT: No tinnitus. No postnasal drip. No redness of the oropharynx.  RESPIRATORY: No cough, no wheeze, no hemoptysis. No dyspnea.  CARDIOVASCULAR: No chest pain. No orthopnea. No palpitations. No syncope.  GASTROINTESTINAL: No nausea, no vomiting or diarrhea. No abdominal pain. No melena or hematochezia.  GENITOURINARY: No dysuria or hematuria.  ENDOCRINE: No polyuria or nocturia. No heat or cold intolerance.  HEMATOLOGY: No anemia. No bruising. No bleeding.  INTEGUMENTARY: No rashes. No lesions.  MUSCULOSKELETAL: No arthritis. No swelling. No gout.  Positive back pain NEUROLOGIC: No numbness, tingling, or ataxia. No seizure-type activity.  PSYCHIATRIC: No anxiety. No insomnia. No ADD.    Vitals:   Vitals:   01/10/18 0516 01/10/18 2002 01/11/18 0444 01/11/18 0939  BP: (!) 157/94 124/82 (!) 150/96 (!) 150/95  Pulse: 62 77 73 79  Resp: 16 18 18    Temp: 97.7 F (36.5 C) 98.3 F (36.8 C) 98.2 F (36.8 C)   TempSrc: Oral Oral Oral   SpO2: 98% 100% 99% 100%  Weight:      Height:        Wt Readings from Last 3 Encounters:  01/08/18 62.9 kg (138 lb 10.7 oz)     Intake/Output Summary (Last 24  hours) at 01/11/2018 1035 Last data filed at 01/11/2018 0626 Gross per 24 hour  Intake 2109.58 ml  Output 1925 ml  Net 184.58 ml    Physical Exam:   GENERAL: Pleasant-appearing in no apparent distress.  HEAD, EYES, EARS, NOSE AND THROAT: Atraumatic, normocephalic. Extraocular muscles are intact. Pupils equal and reactive to light. Sclerae anicteric. No conjunctival injection. No oro-pharyngeal erythema.  NECK: Supple. There is no jugular venous distention. No bruits, no lymphadenopathy, no thyromegaly.  HEART: Regular rate and rhythm,. No murmurs, no rubs, no clicks.  LUNGS: Clear to auscultation bilaterally. No rales or rhonchi.  No wheezes.  ABDOMEN: Soft, flat, nontender, nondistended. Has good bowel sounds. No hepatosplenomegaly appreciated.  EXTREMITIES: No evidence of any cyanosis, clubbing, or peripheral edema.  +2 pedal and radial pulses bilaterally.  NEUROLOGIC: The patient is alert, awake, and oriented x3 with no focal motor or sensory deficits appreciated bilaterally.  SKIN: Moist and warm with no rashes appreciated.  Psych: Patient affect is blunt, appears to have withdrawn, and depressed mood. LN: No inguinal LN enlargement    Antibiotics   Anti-infectives (From admission, onward)   None      Medications   Scheduled Meds: . enoxaparin (LOVENOX) injection  40 mg Subcutaneous Q24H  . feeding supplement (ENSURE ENLIVE)  237 mL Oral BID BM  . methylPREDNISolone  4 mg Oral 4X daily taper  . multivitamin with minerals  1 tablet Oral Daily  . Vitamin D (Ergocalciferol)  50,000 Units Oral Q7 days   Continuous Infusions: . sodium chloride 75 mL/hr at 01/11/18 0626   PRN Meds:.acetaminophen **OR** acetaminophen, ibuprofen, ondansetron **OR** ondansetron (ZOFRAN) IV   Data Review:   Micro Results No results found for this or any previous visit (from the past 240 hour(s)).  Radiology Reports Ct Head Wo Contrast  Result Date: 01/08/2018 CLINICAL DATA:  Weakness, back  pain for 4 days, no injury. EXAM: CT HEAD WITHOUT CONTRAST TECHNIQUE: Contiguous axial images were obtained from the base of the skull through the vertex without intravenous contrast. COMPARISON:  05/01/2006. FINDINGS: Brain: No evidence for acute infarction, hemorrhage, mass lesion, hydrocephalus, or extra-axial fluid. Mild atrophy. Hypoattenuation of white matter, likely small vessel disease. Vascular: No hyperdense vessel or unexpected calcification. Skull: Normal. Negative for fracture or focal lesion. Sinuses/Orbits: No acute finding. Other: None. IMPRESSION: Mild atrophy. No acute intracranial findings. Progression of cerebral volume loss since 2007. Electronically Signed   By: Staci Righter M.D.   On: 01/08/2018 19:10   Mr Lumbar Spine Wo Contrast  Result Date: 01/08/2018 CLINICAL DATA:  Initial evaluation for generalized weakness with low back pain for 4 days. EXAM: MRI LUMBAR SPINE WITHOUT CONTRAST TECHNIQUE: Multiplanar, multisequence MR imaging of the lumbar spine was performed. No intravenous contrast was administered. COMPARISON:  None. FINDINGS: Segmentation: Normal segmentation. Lowest well-formed disc labeled the L5-S1 level. Alignment: Trace retrolisthesis of L2 on L3. Vertebral bodies otherwise normally aligned with preservation of the normal lumbar lordosis. Vertebrae: Vertebral body heights maintained without evidence for acute or chronic fracture. Bone marrow signal intensity within normal limits. No discrete or worrisome osseous lesions. Chronic reactive endplate changes present about the L5-S1 interspace. Conus medullaris and cauda equina: Conus extends to the T12 level. Conus and cauda equina appear normal. Paraspinal and other soft tissues: Mild paraspinous edema within the lower posterior paraspinous musculature, which could reflect myositis and/or muscular injury/strain. Subcentimeter T2 hyperintense cyst noted within the right kidney. Visualized visceral structures otherwise  unremarkable. Disc levels: L1-2: Chronic intervertebral disc space narrowing without significant disc bulge. No stenosis. L2-3: Trace retrolisthesis. Mild chronic intervertebral disc space narrowing with diffuse disc bulge and disc desiccation. Mild facet hypertrophy. No stenosis. L3-4: Mild diffuse disc bulge with disc desiccation. Mild facet ligament flavum hypertrophy. Superimposed shallow right foraminal disc protrusion contacts the exiting right L3 nerve root (series 3, image 3). No significant spinal stenosis. Mild to moderate right L3 foraminal narrowing. L4-5: Mild diffuse disc bulge. Mild facet hypertrophy. No significant spinal stenosis. Mild bilateral L4 foraminal narrowing, slightly greater on the right. L5-S1: Chronic intervertebral disc space narrowing with diffuse disc bulge and disc  desiccation. Mild facet hypertrophy, greater on the right. No significant spinal stenosis. Mild to moderate left L5 foraminal stenosis. IMPRESSION: 1. No evidence for cord compression or cauda equina. 2. Soft tissue edema involving the posterior paraspinous musculature bilaterally, right slightly greater than left. Finding is of uncertain etiology or significance, but could reflect acute myositis or possibly muscular injury/strain. Correlation with history and CPK suggested. 3. Shallow right foraminal disc protrusion at L3-4, contacting and potentially affecting the right L3 nerve root. 4. Disc bulge with facet hypertrophy at L4-5 and L5-S1 with resultant mild bilateral L4 with mild to moderate left L5 foraminal stenosis. Electronically Signed   By: Jeannine Boga M.D.   On: 01/08/2018 22:05     CBC Recent Labs  Lab 01/08/18 1702 01/09/18 0036 01/10/18 0446  WBC 6.2 6.4 5.3  HGB 14.0 13.0 13.0  HCT 40.0 37.9* 37.5*  PLT 190 186 189  MCV 91.3 91.9 91.6  MCH 32.0 31.4 31.7  MCHC 35.1 34.2 34.6  RDW 13.0 12.9 12.5    Chemistries  Recent Labs  Lab 01/08/18 1702 01/09/18 0036 01/10/18 0446  NA  138 138 139  K 3.7 3.6 4.0  CL 103 102 108  CO2 27 27 26   GLUCOSE 117* 80 132*  BUN 17 14 13   CREATININE 0.90 0.78 0.72  CALCIUM 8.7* 8.5* 8.5*  AST 291*  --  133*  ALT 93*  --  73*  ALKPHOS 50  --  42  BILITOT 2.0*  --  1.1   ------------------------------------------------------------------------------------------------------------------ estimated creatinine clearance is 84.1 mL/min (by C-G formula based on SCr of 0.72 mg/dL). ------------------------------------------------------------------------------------------------------------------ No results for input(s): HGBA1C in the last 72 hours. ------------------------------------------------------------------------------------------------------------------ No results for input(s): CHOL, HDL, LDLCALC, TRIG, CHOLHDL, LDLDIRECT in the last 72 hours. ------------------------------------------------------------------------------------------------------------------ Recent Labs    01/08/18 1702  TSH 2.553   ------------------------------------------------------------------------------------------------------------------ Recent Labs    01/09/18 0948  VITAMINB12 145*    Coagulation profile No results for input(s): INR, PROTIME in the last 168 hours.  No results for input(s): DDIMER in the last 72 hours.  Cardiac Enzymes Recent Labs  Lab 01/08/18 1702  TROPONINI <0.03   ------------------------------------------------------------------------------------------------------------------ Invalid input(s): POCBNP    Assessment & Plan  Patient is a 62 year old presenting with back pain   1. Back pain -MRI showed muscular inflammation likely causing the patient's back pain.    Possibly due to myositis /  Continue Medrol Dosepak  2.  Elevated CPK likely due to myositis CPK trending down.  CK down from yesterday but still high so continue  IV fluids ,  sed rate was checked which is normal, polymyositis is being ruled out with  blood test 3.  Shuffling gait -seen by neurology they do not feel he has Parkinson's 4.  Depression ;psych evaluation recommended inpatient psychiatric admission for ECT treatments.  Likely discharge tomorrow.  Unable to discharge to psych today secondary to still high CPK requiring IV hydration  5.  Severe vitamin D deficiency we will start patient on vitamin D replacement 6.  Severe vitamin B12 deficiency we will start patient on vitamin B12 supplement    Likely discharge to psychiatric ward tomorrow if further improvement in CPK      Code Status Orders  (From admission, onward)        Start     Ordered   01/08/18 2351  Full code  Continuous     01/08/18 2350    Code Status History    This patient has a current  code status but no historical code status.           Consults  Neuro,pchy   DVT Prophylaxis  Lovenox   Lab Results  Component Value Date   PLT 189 01/10/2018     Time Spent in minutes   46min  Greater than 50% of time spent in care coordination and counseling patient regarding the condition and plan of care.   Epifanio Lesches M.D on 01/11/2018 at 10:35 AM  Between 7am to 6pm - Pager - [phone]  After 6pm go to [email or web address] - Proofreader  [employer]   Office  [phone]

## 2018-01-12 ENCOUNTER — Observation Stay: Payer: BLUE CROSS/BLUE SHIELD | Admitting: Registered Nurse

## 2018-01-12 ENCOUNTER — Inpatient Hospital Stay
Admission: RE | Admit: 2018-01-12 | Discharge: 2018-02-08 | DRG: 885 | Disposition: A | Discharge status: Home / Self Care | Payer: BLUE CROSS/BLUE SHIELD | Source: Intra-hospital | Attending: Psychiatry | Admitting: Psychiatry

## 2018-01-12 ENCOUNTER — Other Ambulatory Visit: Payer: Self-pay

## 2018-01-12 ENCOUNTER — Observation Stay: Payer: BLUE CROSS/BLUE SHIELD

## 2018-01-12 DIAGNOSIS — F332 Major depressive disorder, recurrent severe without psychotic features: Secondary | ICD-10-CM

## 2018-01-12 DIAGNOSIS — F061 Catatonic disorder due to known physiological condition: Secondary | ICD-10-CM

## 2018-01-12 DIAGNOSIS — Z87891 Personal history of nicotine dependence: Secondary | ICD-10-CM

## 2018-01-12 DIAGNOSIS — Z79899 Other long term (current) drug therapy: Secondary | ICD-10-CM

## 2018-01-12 DIAGNOSIS — E440 Moderate protein-calorie malnutrition: Secondary | ICD-10-CM | POA: Diagnosis present

## 2018-01-12 DIAGNOSIS — F419 Anxiety disorder, unspecified: Secondary | ICD-10-CM | POA: Diagnosis present

## 2018-01-12 DIAGNOSIS — F202 Catatonic schizophrenia: Secondary | ICD-10-CM | POA: Diagnosis present

## 2018-01-12 DIAGNOSIS — Z6821 Body mass index (BMI) 21.0-21.9, adult: Secondary | ICD-10-CM | POA: Diagnosis not present

## 2018-01-12 LAB — CK: Total CK: 523 U/L — ABNORMAL HIGH (ref 49–397)

## 2018-01-12 MED ORDER — ACETAMINOPHEN 325 MG PO TABS
650.0000 mg | ORAL_TABLET | Freq: Four times a day (QID) | ORAL | Status: DC | PRN
  Administered 2018-01-14 – 2018-02-02 (×2): 650 mg via ORAL
  Filled 2018-01-12 (×2): qty 2

## 2018-01-12 MED ORDER — CYANOCOBALAMIN 1000 MCG PO TABS: 1000.0000 ug | ORAL_TABLET | Freq: Every day | ORAL | 0 refills | Status: DC

## 2018-01-12 MED ORDER — GUAIFENESIN-DM 100-10 MG/5ML PO SYRP
5.0000 mL | ORAL_SOLUTION | ORAL | Status: DC | PRN
  Filled 2018-01-12: qty 5

## 2018-01-12 MED ORDER — ADULT MULTIVITAMIN W/MINERALS CH
1.0000 | ORAL_TABLET | Freq: Every day | ORAL | Status: DC
  Administered 2018-01-13 – 2018-02-08 (×26): 1 via ORAL
  Filled 2018-01-12 (×29): qty 1

## 2018-01-12 MED ORDER — IBUPROFEN 200 MG PO TABS
400.0000 mg | ORAL_TABLET | Freq: Four times a day (QID) | ORAL | Status: DC | PRN
  Administered 2018-01-12 – 2018-02-06 (×6): 400 mg via ORAL
  Filled 2018-01-12 (×5): qty 2

## 2018-01-12 MED ORDER — ENSURE ENLIVE PO LIQD
237.0000 mL | Freq: Two times a day (BID) | ORAL | Status: DC
  Administered 2018-01-12 – 2018-02-08 (×43): 237 mL via ORAL

## 2018-01-12 MED ORDER — ALUM & MAG HYDROXIDE-SIMETH 200-200-20 MG/5ML PO SUSP: 30.0000 mL | ORAL | Status: DC | PRN

## 2018-01-12 MED ORDER — SODIUM CHLORIDE 0.9 % IV SOLN
500.0000 mL | Freq: Once | INTRAVENOUS | Status: AC
  Administered 2018-01-12: 500 mL via INTRAVENOUS

## 2018-01-12 MED ORDER — IBUPROFEN 600 MG PO TABS: 600.0000 mg | ORAL_TABLET | Freq: Four times a day (QID) | ORAL | 0 refills | Status: DC | PRN

## 2018-01-12 MED ORDER — VITAMIN D (ERGOCALCIFEROL) 1.25 MG (50000 UNIT) PO CAPS: 50000.0000 [IU] | ORAL_CAPSULE | ORAL | 0 refills | Status: DC

## 2018-01-12 MED ORDER — SUCCINYLCHOLINE CHLORIDE 20 MG/ML IJ SOLN
INTRAMUSCULAR | Status: DC | PRN
  Administered 2018-01-12: 80 mg via INTRAVENOUS

## 2018-01-12 MED ORDER — TRAZODONE HCL 100 MG PO TABS
100.0000 mg | ORAL_TABLET | Freq: Every evening | ORAL | Status: DC | PRN
  Administered 2018-01-12 – 2018-02-05 (×11): 100 mg via ORAL
  Filled 2018-01-12 (×12): qty 1

## 2018-01-12 MED ORDER — METHOHEXITAL SODIUM 0.5 G IJ SOLR
INTRAMUSCULAR | Status: AC
  Filled 2018-01-12: qty 500

## 2018-01-12 MED ORDER — PANTOPRAZOLE SODIUM 40 MG PO TBEC
40.0000 mg | DELAYED_RELEASE_TABLET | Freq: Every day | ORAL | Status: DC
  Administered 2018-01-12 – 2018-02-08 (×28): 40 mg via ORAL
  Filled 2018-01-12 (×29): qty 1

## 2018-01-12 MED ORDER — CYANOCOBALAMIN 1000 MCG/ML IJ SOLN
1000.0000 ug | Freq: Once | INTRAMUSCULAR | Status: AC
  Administered 2018-01-12: 1000 ug via INTRAMUSCULAR
  Filled 2018-01-12: qty 1

## 2018-01-12 MED ORDER — ADULT MULTIVITAMIN W/MINERALS CH: 1.0000 | ORAL_TABLET | Freq: Every day | ORAL | 0 refills | Status: DC

## 2018-01-12 MED ORDER — SUCCINYLCHOLINE CHLORIDE 20 MG/ML IJ SOLN
INTRAMUSCULAR | Status: AC
  Filled 2018-01-12: qty 3

## 2018-01-12 MED ORDER — MAGNESIUM HYDROXIDE 400 MG/5ML PO SUSP
30.0000 mL | Freq: Every day | ORAL | Status: DC | PRN
Start: 2018-01-12 — End: 2018-02-08
  Administered 2018-01-16: 30 mL via ORAL
  Filled 2018-01-12: qty 30

## 2018-01-12 MED ORDER — ENSURE ENLIVE PO LIQD: 237.0000 mL | Freq: Two times a day (BID) | ORAL | 12 refills | Status: DC

## 2018-01-12 MED ORDER — ONDANSETRON HCL 4 MG/2ML IJ SOLN: 4.0000 mg | Freq: Once | INTRAMUSCULAR | Status: DC | PRN

## 2018-01-12 MED ORDER — PREDNISONE 10 MG (21) PO TBPK: ORAL_TABLET | ORAL | 0 refills | Status: DC

## 2018-01-12 MED ORDER — VITAMIN B-12 1000 MCG PO TABS: 1000.0000 ug | ORAL_TABLET | Freq: Every day | ORAL | Status: DC

## 2018-01-12 MED ORDER — MIRTAZAPINE 15 MG PO TABS
15.0000 mg | ORAL_TABLET | Freq: Every day | ORAL | Status: DC
  Administered 2018-01-12 – 2018-01-14 (×3): 15 mg via ORAL
  Filled 2018-01-12 (×4): qty 1

## 2018-01-12 MED ORDER — METHOHEXITAL SODIUM 100 MG/10ML IV SOSY
PREFILLED_SYRINGE | INTRAVENOUS | Status: DC | PRN
  Administered 2018-01-12: 70 mg via INTRAVENOUS

## 2018-01-12 NOTE — [facility] Suicide Risk Assessment (Signed)
[facility] Admission Suicide Risk Assessment   Nursing information obtained from:    Demographic factors:    Current Mental Status:    Loss Factors:    Historical Factors:    Risk Reduction Factors:     Total Time spent with patient: 1 hour Principal Problem: Severe recurrent major depression without psychotic features [facility]) Diagnosis:   Patient Active Problem List   Diagnosis Date Noted  . Catatonia [F06.1] 01/12/2018  . Malnutrition of moderate degree [E44.0] 01/09/2018  . Pressure injury of skin [L89.90] 01/09/2018  . Severe recurrent major depression without psychotic features ([place]) [F33.2] 01/09/2018  . Back pain [M54.9] 01/08/2018  . Shuffling gait [R26.89] 01/08/2018  . Depression [F32.9] 01/08/2018   Subjective Data: 62 year old man transferred from the medical service.  History of major depression present for years but worse over the last few months.  Patient is taking very poor care of himself.  Barely ever gets out of bed.  Mentally very slowed.  Depressed and hopeless.  Patient is partially catatonic.  Not actively suicidal or threatening.  Cooperative but has a lot of difficulty taking care of himself.  Continued Clinical Symptoms:    The "Alcohol Use Disorders Identification Test", Guidelines for Use in Primary Care, Second Edition.  World Pharmacologist [facility]). Score between 0-7:  no or low risk or alcohol related problems. Score between 8-15:  moderate risk of alcohol related problems. Score between 16-19:  high risk of alcohol related problems. Score 20 or above:  warrants further diagnostic evaluation for alcohol dependence and treatment.   CLINICAL FACTORS:   Depression:   Anhedonia   Musculoskeletal: Strength & Muscle Tone: decreased Gait & Station: unable to stand Patient leans: N/A  Psychiatric Specialty Exam: Physical Exam  Nursing note and vitals reviewed. Constitutional: He appears well-developed and well-nourished.  HENT:  Head: Normocephalic and  atraumatic.  Eyes: Pupils are equal, round, and reactive to light. Conjunctivae are normal.  Neck: Normal range of motion.  Cardiovascular: Regular rhythm and normal heart sounds.  Respiratory: Effort normal. No respiratory distress.  GI: Soft.  Musculoskeletal: Normal range of motion.  Neurological: He is alert.  Skin: Skin is warm and dry.  Psychiatric: His affect is blunt. His speech is delayed. He is slowed and withdrawn. Cognition and memory are impaired. He expresses inappropriate judgment. He expresses no homicidal and no suicidal ideation.    Review of Systems  Constitutional: Positive for malaise/fatigue and weight loss.  HENT: Negative.   Eyes: Negative.   Respiratory: Negative.   Cardiovascular: Negative.   Gastrointestinal: Negative.   Musculoskeletal: Negative.   Skin: Negative.   Neurological: Positive for focal weakness and weakness.  Psychiatric/Behavioral: Positive for depression and memory loss. Negative for hallucinations, substance abuse and suicidal ideas. The patient is nervous/anxious and has insomnia.     Blood pressure 128/78, pulse 98, temperature 98.2 F (36.8 C), temperature source Oral, resp. rate 18, height 5\' 10"  (1.778 m), weight 68.9 kg (152 lb), SpO2 99 %.Body mass index is 21.81 kg/m.  General Appearance: Casual  Eye Contact:  Minimal  Speech:  Slow  Volume:  Decreased  Mood:  Depressed and Dysphoric  Affect:  Constricted  Thought Process:  Disorganized  Orientation:  Full (Time, Place, and Person)  Thought Content:  Logical  Suicidal Thoughts:  No  Homicidal Thoughts:  No  Memory:  Immediate;   Fair Recent;   Fair Remote;   Fair  Judgement:  Fair  Insight:  Fair  Psychomotor Activity:  Decreased  Concentration:  Concentration: Poor  Recall:  Poor  Fund of Knowledge:  Fair  Language:  Fair  Akathisia:  No  Handed:  Right  AIMS (if indicated):     Assets:  Desire for Improvement Housing Resilience Social Support  ADL's:  Impaired   Cognition:  Impaired,  Mild  Sleep:         COGNITIVE FEATURES THAT CONTRIBUTE TO RISK:  Loss of executive function    SUICIDE RISK:   Mild:  Suicidal ideation of limited frequency, intensity, duration, and specificity.  There are no identifiable plans, no associated intent, mild dysphoria and related symptoms, good self-control (both objective and subjective assessment), few other risk factors, and identifiable protective factors, including available and accessible social support.  PLAN OF CARE: Patient does not have active suicidal thoughts but is catatonic withdrawn and has very poor ability at self-care right now.  Admitted to the psychiatric service for stabilization and beginning ECT treatment.  Daily monitoring individual and group and by full treatment team.  Reassess suicidality along with other safety issues prior to discharge.  I certify that inpatient services furnished can reasonably be expected to improve the patient's condition.   Alethia Berthold, MD 01/12/2018, 3:03 PM

## 2018-01-12 NOTE — Progress Notes (Signed)
Patient came from medical floor.Alert and oriented x4.Patient refused to bed from wheelchair.States "I can not do anything by myself."With 2 assistance put patient in bed and made comfortable.Denies SI,HI and AVH now.Skin assessment and body search done,no contraband found.

## 2018-01-12 NOTE — Procedures (Signed)
ECT SERVICES Physician's Interval Evaluation & Treatment Note  Patient Identification: [name] MRN:  [id number] Date of Evaluation:  01/12/2018 TX #: 1  MADRS: 43  MMSE: 29  P.E. Findings:  Patient has impaired muscle tone especially in the lower extremities week but otherwise physically stable no cardiac or pulmonary problems depressed mood lack of motivation and lack of interest.  Not evidently psychotic.  Not suicidal.  Psychiatric Interval Note:  See note above  Subjective:  Patient is a 62 y.o. male seen for evaluation for Electroconvulsive Therapy. Patient complains of lack of energy and lack of motivation  Treatment Summary:   []   Right Unilateral             [x]  Bilateral   % Energy : 1.0 ms 70%   Impedance: 730 ohms  Seizure Energy Index: 10,919 V squared  Postictal Suppression Index: 85%  Seizure Concordance Index: 98%  Medications  Pre Shock: Brevital 70 mg succinylcholine 80 mg  Post Shock: None  Seizure Duration: 60 seconds by EMG 109 seconds by EEG   Comments: Patient had 2 stimulations.  First time at 35% did not have any seizure activity.  Second stimulation produced adequate seizure as noted above.  Plan to go ahead with 3 times a week schedule.  I am hoping we will be able to move him down to the psychiatric unit sooner rather than later  Lungs:  [x]   Clear to auscultation               []  Other:   Heart:    [x]   Regular rhythm             []  irregular rhythm    [x]   Previous H&P reviewed, patient examined and there are NO CHANGES                 []   Previous H&P reviewed, patient examined and there are changes noted.   Alethia Berthold, MD 7/5/201910:04 AM

## 2018-01-12 NOTE — Discharge Summary (Signed)
[name], is a 62 y.o. male  DOB 1955-11-12  MRN [id number].  Admission date:  01/08/2018  Admitting Physician  Lance Coon, MD  Discharge Date:  01/12/2018   Primary MD  [place]  Recommendations for primary care physician for things to follow: Patient being discharged to psychiatric ward.    Discharge to [facility] when the bed is available   Admission Diagnosis  Weakness [R53.1]   Discharge Diagnosis  Weakness [R53.1]   Principal Problem:   Severe recurrent major depression without psychotic features ([place]) Active Problems:   Back pain   Shuffling gait   Depression   Malnutrition of moderate degree   Pressure injury of skin      Past Medical History:  Diagnosis Date  . Depression     Past Surgical History:  Procedure Laterality Date  . HERNIA REPAIR         History of present illness and  Hospital Course:     Kindly see H&P for history of present illness and admission details, please review complete Labs, Consult reports and Test reports for all details in brief  HPI  from the history and physical done on the day of admission  A 62 year old male patient with history of depression admitted for back pain, generalized weakness.  Patient was feeling so weak that he could not get up out of bed to do anything.  Patient also noted to have unsteady gait.  Hospital Course  Low back pain, MRI of the lumbosacral spine showed muscular inflammation, patient started on Medrol Dosepak for possible myositis.  ESR has been negative polymyositis is ruled out.  Patient received ibuprofen, Medrol Dosepak.  Pain is better than before.  Patient also seen by neurology Dr. Irish Elders.  2.  Elevated CPK secondary to myositis: Patient received aggressive hydration, patient CK is down from 7000-523.  #3.  severe vitamin D D deficiency, B12 deficiency: Vitamin D level 4.5, and B12 level is 145.  Patient received vitamin D 50,000 units every week, also will write prescription for vitamin B12 thousand micrograms p.o. 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  Severe depression: Patient noted to have poor p.o. intake for months and not moving  For month., not tolerated previous attempt to treat depression seen by psychiatry, recommended ECT treatments, patient will go to psychiatric ward when the bed is available, patient to start ECT treatment today ,first treatment is today.      discharge Condition: -Stable  Follow UP  [place]. Schedule an appointment as soon as possible for a visit in 2 week(s).   Specialty:  Family Medicine Contact information: [place] [id number] [phone]             Discharge Instructions  and  Discharge Medications      Allergies as of 01/12/2018   No Known Allergies     Medication List    TAKE these medications   feeding supplement (ENSURE ENLIVE) Liqd Take 237 mLs by mouth 2 (two) times daily between meals.   ibuprofen 600 MG tablet Commonly known as:  ADVIL,MOTRIN Take 1 tablet (600 mg total) by mouth every 6 (six) hours as needed for moderate pain. What changed:    medication strength  how much to take  reasons to take this   multivitamin with minerals Tabs tablet Take 1 tablet by mouth daily.  predniSONE 10 MG (21) Tbpk tablet Commonly known as:  STERAPRED UNI-PAK 21 TAB Taper by 10 mg daily   Vitamin D (Ergocalciferol) 50000 units Caps capsule Commonly known as:  DRISDOL Take 1 capsule (50,000 Units total) by mouth every 7 (seven) days. Start taking on:  01/17/2018         Diet and Activity recommendation: See Discharge Instructions above   Consults obtained -psychiatric,  neurology   Major procedures and Radiology Reports - PLEASE review detailed and final reports for all details, in brief -      Ct Head Wo Contrast  Result Date: 01/08/2018 CLINICAL DATA:  Weakness, back pain for 4 days, no injury. EXAM: CT HEAD WITHOUT CONTRAST TECHNIQUE: Contiguous axial images were obtained from the base of the skull through the vertex without intravenous contrast. COMPARISON:  05/01/2006. FINDINGS: Brain: No evidence for acute infarction, hemorrhage, mass lesion, hydrocephalus, or extra-axial fluid. Mild atrophy. Hypoattenuation of white matter, likely small vessel disease. Vascular: No hyperdense vessel or unexpected calcification. Skull: Normal. Negative for fracture or focal lesion. Sinuses/Orbits: No acute finding. Other: None. IMPRESSION: Mild atrophy. No acute intracranial findings. Progression of cerebral volume loss since 2007. Electronically Signed   By: Staci Righter M.D.   On: 01/08/2018 19:10   Mr Lumbar Spine Wo Contrast  Result Date: 01/08/2018 CLINICAL DATA:  Initial evaluation for generalized weakness with low back pain for 4 days. EXAM: MRI LUMBAR SPINE WITHOUT CONTRAST TECHNIQUE: Multiplanar, multisequence MR imaging of the lumbar spine was performed. No intravenous contrast was administered. COMPARISON:  None. FINDINGS: Segmentation: Normal segmentation. Lowest well-formed disc labeled the L5-S1 level. Alignment: Trace retrolisthesis of L2 on L3. Vertebral bodies otherwise normally aligned with preservation of the normal lumbar lordosis. Vertebrae: Vertebral body heights maintained without evidence for acute or chronic fracture. Bone marrow signal intensity within normal limits. No discrete or worrisome osseous lesions. Chronic reactive endplate changes present about the L5-S1 interspace. Conus medullaris and cauda equina: Conus extends to the T12 level. Conus and cauda equina appear normal. Paraspinal and other soft tissues: Mild paraspinous edema within the lower  posterior paraspinous musculature, which could reflect myositis and/or muscular injury/strain. Subcentimeter T2 hyperintense cyst noted within the right kidney. Visualized visceral structures otherwise unremarkable. Disc levels: L1-2: Chronic intervertebral disc space narrowing without significant disc bulge. No stenosis. L2-3: Trace retrolisthesis. Mild chronic intervertebral disc space narrowing with diffuse disc bulge and disc desiccation. Mild facet hypertrophy. No stenosis. L3-4: Mild diffuse disc bulge with disc desiccation. Mild facet ligament flavum hypertrophy. Superimposed shallow right foraminal disc protrusion contacts the exiting right L3 nerve root (series 3, image 3). No significant spinal stenosis. Mild to moderate right L3 foraminal narrowing. L4-5: Mild diffuse disc bulge. Mild facet hypertrophy. No significant spinal stenosis. Mild bilateral L4 foraminal narrowing, slightly greater on the right. L5-S1: Chronic intervertebral disc space narrowing with diffuse disc bulge and disc desiccation. Mild facet hypertrophy, greater on the right. No significant spinal stenosis. Mild to moderate left L5 foraminal stenosis. IMPRESSION: 1. No evidence for cord compression or cauda equina. 2. Soft tissue edema involving the posterior paraspinous musculature bilaterally, right slightly greater than left. Finding is of uncertain etiology or significance, but could reflect acute myositis or possibly muscular injury/strain. Correlation with history and CPK suggested. 3. Shallow right foraminal disc protrusion at L3-4, contacting and potentially affecting the right L3 nerve root. 4. Disc bulge with facet hypertrophy at L4-5 and L5-S1 with resultant mild bilateral L4 with mild to moderate left L5 foraminal stenosis.  Electronically Signed   By: Jeannine Boga M.D.   On: 01/08/2018 22:05    Micro Results      No results found for this or any previous visit (from the past 240 hour(s)).     Today    Subjective:   [name] today stable for discharge to behavioral unit Objective:   Blood pressure 137/84, pulse 71, temperature 98.1 F (36.7 C), temperature source Oral, resp. rate 18, height '5\' 10"'  (1.778 m), weight 62.9 kg (138 lb 10.7 oz), SpO2 99 %.   Intake/Output Summary (Last 24 hours) at 01/12/2018 0742 Last data filed at 01/12/2018 0430 Gross per 24 hour  Intake 1345 ml  Output 850 ml  Net 495 ml    Exam Awake Alert, Oriented x 3, slow to respond with flat affect  No new F.N deficitsNC.AT,PERRAL Supple Neck,No JVD, No cervical lymphadenopathy appriciated.  Symmetrical Chest wall movement, Good air movement bilaterally, CTAB RRR,No Gallops,Rubs or new Murmurs, No Parasternal Heave +ve B.Sounds, Abd Soft, Non tender, No organomegaly appriciated, No rebound -guarding or rigidity. No Cyanosis, Clubbing or edema, No new Rash or bruise  Data Review   CBC w Diff:  Lab Results  Component Value Date   WBC 5.3 01/10/2018   HGB 13.0 01/10/2018   HCT 37.5 (L) 01/10/2018   PLT 189 01/10/2018    CMP:  Lab Results  Component Value Date   NA 139 01/10/2018   K 4.0 01/10/2018   CL 108 01/10/2018   CO2 26 01/10/2018   BUN 13 01/10/2018   CREATININE 0.72 01/10/2018   PROT 6.1 (L) 01/10/2018   ALBUMIN 3.1 (L) 01/10/2018   BILITOT 1.1 01/10/2018   ALKPHOS 42 01/10/2018   AST 133 (H) 01/10/2018   ALT 73 (H) 01/10/2018  .   Total Time in preparing paper work, data evaluation and todays exam - 35 minutes  Epifanio Lesches M.D on 01/12/2018 at 7:42 AM    Note: This dictation was prepared with Dragon dictation along with smaller phrase technology. Any transcriptional errors that result from this process are unintentional.

## 2018-01-12 NOTE — Anesthesia Procedure Notes (Signed)
Performed by: Tinna Kolker, CRNA Pre-anesthesia Checklist: Patient identified, Emergency Drugs available, Suction available and Patient being monitored Patient Re-evaluated:Patient Re-evaluated prior to induction Oxygen Delivery Method: Circle system utilized Preoxygenation: Pre-oxygenation with 100% oxygen Induction Type: IV induction Ventilation: Mask ventilation without difficulty and Mask ventilation throughout procedure Airway Equipment and Method: Bite block Placement Confirmation: positive ETCO2 Dental Injury: Teeth and Oropharynx as per pre-operative assessment        

## 2018-01-12 NOTE — Transfer of Care (Signed)
Immediate Anesthesia Transfer of Care Note  Patient: [name]  Procedure(s) Performed: ECT TX  Patient Location: PACU  Anesthesia Type:General  Level of Consciousness: sedated  Airway & Oxygen Therapy: Patient Spontanous Breathing and Patient connected to face mask oxygen  Post-op Assessment: Report given to RN and Post -op Vital signs reviewed and stable  Post vital signs: Reviewed and stable  Last Vitals:  Vitals Value Taken Time  BP 158/98 01/12/2018  9:28 AM  Temp 37.1 C 01/12/2018  9:28 AM  Pulse 57 01/12/2018  9:28 AM  Resp 12 01/12/2018  9:28 AM  SpO2 100 % 01/12/2018  9:28 AM  Vitals shown include unvalidated device data.  Last Pain:  Vitals:   01/12/18 0803  TempSrc: Oral  PainSc:          Complications: No apparent anesthesia complications

## 2018-01-12 NOTE — Progress Notes (Signed)
Initial Nutrition Assessment  DOCUMENTATION CODES:   Non-severe (moderate) malnutrition in context of social or environmental circumstances  INTERVENTION:   Provide Ensure Enlive po BID, each supplement provides 350 kcal and 20 grams of protein.  Provide daily MVI.  NUTRITION DIAGNOSIS:   Moderate Malnutrition related to social / environmental circumstances(depression, difficulty ambulating and preparing meals for himself ) as evidenced by moderate fat depletion, moderate muscle depletion.  GOAL:   Patient will meet greater than or equal to 90% of their needs  MONITOR:   PO intake, Supplement acceptance  REASON FOR ASSESSMENT:   Malnutrition Screening Tool    ASSESSMENT:   62 year old male with PMHx of depression who presented to [facility] with weakness, back pain and shuffling gait. Pt transfered to [facility] fro depression and ECT therapy    Pt seen by Dietitian while admitted to [facility]. Patient reports decreased appetite for a while now, but is unsure exactly how long. He is not sure what is causing his decreased appetite. He does report some depression. His wife left him 3 months ago. His grandson lives with him now and helps to take care of him. He only eats one meal per day now and that is usually prepared or purchased by his grandson. He may have a burger, a chicken sandwich, or a meal prepared at home. Otherwise he just snacks on peanut butter crackers during the day. He has not previously tried any oral nutrition supplements. Denies any abdominal pain or difficulty chewing/swallowing. Does endorse constipation. He is also having difficulty getting around and preparing meals for himself.  UBW was 200 lbs. He believes he last weighed this in 2010 and has had slow weight loss. No weight history in chart to trend.  Meal Completion: 100%   Medications reviewed and include: protonix  Labs reviewed:   Diet Order:   Diet Order           Diet regular Room service appropriate? Yes;  Fluid consistency: Thin  Diet effective now         EDUCATION NEEDS:   No education needs have been identified at this time  Skin:  Skin Assessment: Reviewed RN Assessment(Stage I buttocks )  Last BM:  7/2  Height:   Ht Readings from Last 1 Encounters:  01/12/18 5\' 10"  (1.778 m)    Weight:   Wt Readings from Last 1 Encounters:  01/12/18 138 lb (62.6 kg)    Ideal Body Weight:  75.5 kg  BMI:  There is no height or weight on file to calculate BMI.  Estimated Nutritional Needs:   Kcal:  1700-2000kcal/day   Protein:  75-90g/day   Fluid:  1.8-2.2L/day   Koleen Distance MS, RD, LDN Pager #- [phone] Office#- [phone] After Hours Pager: [phone]

## 2018-01-12 NOTE — Anesthesia Preprocedure Evaluation (Addendum)
Anesthesia Evaluation  Patient identified by MRN, date of birth, ID band Patient awake    Reviewed: Allergy & Precautions, NPO status , Patient's Chart, lab work & pertinent test results  History of Anesthesia Complications Negative for: history of anesthetic complications  Airway Mallampati: II       Dental   Pulmonary neg sleep apnea, neg COPD, former smoker,           Cardiovascular (-) hypertension(-) Past MI and (-) CHF (-) dysrhythmias (-) Valvular Problems/Murmurs     Neuro/Psych neg Seizures Depression    GI/Hepatic Neg liver ROS, neg GERD  ,  Endo/Other  neg diabetes  Renal/GU negative Renal ROS     Musculoskeletal   Abdominal   Peds  Hematology   Anesthesia Other Findings   Reproductive/Obstetrics                            Anesthesia Physical Anesthesia Plan  ASA: II  Anesthesia Plan: General   Post-op Pain Management:    Induction: Intravenous  PONV Risk Score and Plan: 2 and TIVA  Airway Management Planned: Mask  Additional Equipment:   Intra-op Plan:   Post-operative Plan:   Informed Consent: I have reviewed the patients History and Physical, chart, labs and discussed the procedure including the risks, benefits and alternatives for the proposed anesthesia with the patient or authorized representative who has indicated his/her understanding and acceptance.     Plan Discussed with:   Anesthesia Plan Comments:         Anesthesia Quick Evaluation

## 2018-01-12 NOTE — Progress Notes (Signed)
0500  Patient is able to feed with minimum assistance.Sitter at bedside.  0600  Assisted patient to wheelchair with 2 persons.Patient visiting with family.  0700 Patient visiting with family.Sitter at bedside.

## 2018-01-12 NOTE — Evaluation (Signed)
Physical Therapy Evaluation Patient Details Name: [name] MRN: [id number] DOB: [birth year] Today's Date: 01/12/2018   History of Present Illness  62 y/o male here with c/o severe low back pain.  Per grandson (who pt lives with) he has been unmotivated to do anything over the last several months and has hardly done much more than get out of bed over the last several weeks. Pt diagnosed with severe recurrent depression without psychiatric features. Pt recently transfered to behavior med. New PT eval completed on 01/12/18. Pt began ECT treatments 3x/week beginning 01/12/18    Clinical Impression  Pt is a pleasant 62 year old male who was admitted for severe recurrent major depression. Pt performs bed mobility and transfers with mod assist +2. Pt demonstrates deficits with strength, and mobility. Pt very flat affect, answers questions softly with short phrases. Pt is agreeable to work with PT and states he would enjoy future sessions. Pt able to initiate AROM of both upper and lower extremities however performs motions slowly and is only able to get through small ranges. PT attempted there-ex with patient however pt quickly stops performing. Pt is very depressed throughout session and is difficult to encourage. PT unsure if limitations are physical in nature, comparing today's performance with previous PT sessions indicates limitations more likely due to psychiatric. Pt could benefit from continued skilled therapy at this time to improve deficits toward PLOF. PT will continue to work with pt at least 2x/week while admitted. D/c recommendations at this time are home with home health PT. This entire session was guided, instructed, and directly supervised by Greggory Stallion, DPT.       Follow Up Recommendations Home health PT    Equipment Recommendations  Rolling walker with 5" wheels    Recommendations for Other Services       Precautions / Restrictions Precautions Precautions:  Fall Restrictions Weight Bearing Restrictions: No      Mobility  Bed Mobility Overal bed mobility: Needs Assistance Bed Mobility: Supine to Sit     Supine to sit: Mod assist;+2 for physical assistance     General bed mobility comments: pt mod assist +2 to supine>sit. pt able to maintain sitting posture with min assist and UE support once seated.  Transfers Overall transfer level: Needs assistance Equipment used: Rolling walker (2 wheeled) Transfers: Sit to/from Stand Sit to Stand: Mod assist;+2 physical assistance         General transfer comment: pt mod assist +2 sit<>stand at RW. Pt able to maintain upright posture once standing with min assist. Pt initially demonstrates posterior lean in standing that is corrected with cuing.  Ambulation/Gait             General Gait Details: deferred this visit due to pts inability to perform pre gait exercises safely.  Stairs            Wheelchair Mobility    Modified Rankin (Stroke Patients Only)       Balance Overall balance assessment: Needs assistance   Sitting balance-Leahy Scale: Fair Sitting balance - Comments: able to sit with CGA to min assist EOB with UE support for long period of time     Standing balance-Leahy Scale: Fair Standing balance comment: able to stand with B UE support on RW and min assit without gross LOB                             Pertinent Vitals/Pain Pain Assessment: Faces  Faces Pain Scale: Hurts a little bit Pain Location: low back, R anterior hip Pain Descriptors / Indicators: Aching Pain Intervention(s): Limited activity within patient's tolerance;Monitored during session    Home Living Family/patient expects to be discharged to:: Private residence Living Arrangements: Children Available Help at Discharge: Family;Available PRN/intermittently Type of Home: Apartment Home Access: Stairs to enter   Entrance Stairs-Number of Steps: 20   Home Equipment: None       Prior Function Level of Independence: Independent         Comments: Pt has been very weak recently, but not long ago was going out to eat, going to church, Health visitor [place]        Extremity/Trunk Assessment   Upper Extremity Assessment Upper Extremity Assessment: Generalized weakness(Difficult to assess pt struggles to initiate AROM)    Lower Extremity Assessment Lower Extremity Assessment: Generalized weakness(Difficult to assess pt struggles to initiate AROM)       Communication   Communication: Other (comment)(pt speaks very softly in short phrases.)  Cognition Arousal/Alertness: Awake/alert Behavior During Therapy: Flat affect Overall Cognitive Status: Difficult to assess                                 General Comments: Pt with low energy, appearing depressed demeanor remained same throughout      General Comments General comments (skin integrity, edema, etc.): very depressed    Exercises Other Exercises Other Exercises: attempted LE exercises with variable results including SLR and LAQ. pt able to perform with min assistance initally however quickly stops unsure of reason for pt discontinuing exercises.   Assessment/Plan    PT Assessment Patient needs continued PT services  PT Problem List Decreased strength;Decreased range of motion;Decreased activity tolerance;Decreased balance;Decreased mobility;Decreased coordination;Decreased cognition;Decreased knowledge of use of DME;Decreased safety awareness;Pain       PT Treatment Interventions DME instruction;Functional mobility training;Gait training;Stair training;Therapeutic activities;Therapeutic exercise;Balance training;Neuromuscular re-education;Cognitive remediation;Patient/family education    PT Goals (Current goals can be found in the Care Plan section)  Acute Rehab PT Goals Patient Stated Goal: unsure, family hoping he can do some behavioral/psych rehab PT Goal Formulation: With  patient Time For Goal Achievement: 01/26/18 Potential to Achieve Goals: Fair    Frequency Min 2X/week   Barriers to discharge        Co-evaluation               AM-PAC PT "6 Clicks" Daily Activity  Outcome Measure Difficulty turning over in bed (including adjusting bedclothes, sheets and blankets)?: Unable Difficulty moving from lying on back to sitting on the side of the bed? : Unable Difficulty sitting down on and standing up from a chair with arms (e.g., wheelchair, bedside commode, etc,.)?: Unable Help needed moving to and from a bed to chair (including a wheelchair)?: A Lot Help needed walking in hospital room?: Total Help needed climbing 3-5 steps with a railing? : Total 6 Click Score: 7    End of Session Equipment Utilized During Treatment: Gait belt Activity Tolerance: Patient tolerated treatment well Patient left: in bed;with nursing/sitter in room   PT Visit Diagnosis: Muscle weakness (generalized) (M62.81);Difficulty in walking, not elsewhere classified (R26.2);Unsteadiness on feet (R26.81);Other abnormalities of gait and mobility (R26.89)    Time: 7026-3785 PT Time Calculation (min) (ACUTE ONLY): 30 min   Charges:         PT G Codes:       Shogo Larkey  Cristian Grieves, SPT   Ethelle Ola 01/12/2018, 4:38 PM

## 2018-01-12 NOTE — [facility] Group Notes (Signed)
LCSW Group Therapy Note  01/12/2018 1:00pm  Type of Therapy and Topic:  Group Therapy:  Feelings around Relapse and Recovery  Participation Level:  Did Not Attend   Description of Group:    Patients in this group will discuss emotions they experience before and after a relapse. They will process how experiencing these feelings, or avoidance of experiencing them, relates to having a relapse. Facilitator will guide patients to explore emotions they have related to recovery. Patients will be encouraged to process which emotions are more powerful. They will be guided to discuss the emotional reaction significant others in their lives may have to their relapse or recovery. Patients will be assisted in exploring ways to respond to the emotions of others without this contributing to a relapse.  Therapeutic Goals: 1. Patient will identify two or more emotions that lead to a relapse for them 2. Patient will identify two emotions that result when they relapse 3. Patient will identify two emotions related to recovery 4. Patient will demonstrate ability to communicate their needs through discussion and/or role plays   Summary of Patient Progress:  [name] was invited to today's group, but chose not to attend.   Therapeutic Modalities:   Cognitive Behavioral Therapy Solution-Focused Therapy Assertiveness Training Relapse Prevention Therapy   Arthur Konig, LCSW 01/12/2018 1:43 PM

## 2018-01-12 NOTE — Progress Notes (Signed)
Pt A&Ox2 with some confusion. VS stable. Pt and wife given discharge information. Pt verbalized understanding and denied questions. IVs removed. Paper prescriptions sent with patient to [facility]. Report called to RN at [facility]. Pt transferred to [facility] with security per protocol. Yolanda Bonine and wife notified of discharge and B[facility] transfer.  Kyla Balzarine, LPN

## 2018-01-12 NOTE — H&P (Signed)
[name] is an 62 y.o. male.   Chief Complaint: Patient complains of very low energy low motivation and lack of interest in anything depressed mood HPI: Major depression that has been going on now for years with severe disability  Past Medical History:  Diagnosis Date  . Depression     Past Surgical History:  Procedure Laterality Date  . HERNIA REPAIR      History reviewed. No pertinent family history. Social History:  reports that he has quit smoking. He quit smokeless tobacco use about a year ago. He reports that he has current or past drug history. His alcohol history is not on file.  Allergies: No Known Allergies  Medications Prior to Admission  Medication Sig Dispense Refill  . ibuprofen (ADVIL,MOTRIN) 100 MG tablet Take 200 mg by mouth every 6 (six) hours as needed for pain or fever.      Results for orders placed or performed during the hospital encounter of 01/08/18 (from the past 48 hour(s))  CK     Status: Abnormal   Collection Time: 01/10/18 12:34 PM  Result Value Ref Range   Total CK 1,763 (H) 49 - 397 U/L    Comment: Performed at [facility], [address] [place], [place] [address]  CK     Status: Abnormal   Collection Time: 01/10/18  2:32 PM  Result Value Ref Range   Total CK 1,673 (H) 49 - 397 U/L    Comment: Performed at [facility], [address] [place], [place] [address]  CK     Status: Abnormal   Collection Time: 01/12/18  4:57 AM  Result Value Ref Range   Total CK 523 (H) 49 - 397 U/L    Comment: Performed at [facility], [address] [place], [place] [address]   No results found.  Review of Systems  Constitutional: Negative.   HENT: Negative.   Eyes: Negative.   Respiratory: Negative.   Cardiovascular: Negative.   Gastrointestinal: Negative.   Musculoskeletal: Negative.   Skin: Negative.   Neurological: Positive for weakness.  Psychiatric/Behavioral: Positive for depression and memory loss.  Negative for hallucinations, substance abuse and suicidal ideas. The patient is nervous/anxious and has insomnia.     Blood pressure (!) 167/98, pulse 96, temperature 98.7 F (37.1 C), resp. rate 14, height 5\' 10"  (1.778 m), weight 62.6 kg (138 lb), SpO2 97 %. Physical Exam   Assessment/Plan Patient is medically stable although we can still needs physical therapy and assistance in recovering strength and muscle tone.  ECT treatment beginning today with bilateral treatment continuing with plan 3 times a week schedule going forward as usual.  Alethia Berthold, MD 01/12/2018, 10:03 AM

## 2018-01-12 NOTE — Anesthesia Postprocedure Evaluation (Signed)
Anesthesia Post Note  Patient: [name]  Procedure(s) Performed: ECT TX  Patient location during evaluation: PACU Anesthesia Type: General Level of consciousness: awake and alert Pain management: pain level controlled Vital Signs Assessment: post-procedure vital signs reviewed and stable Respiratory status: spontaneous breathing and respiratory function stable Cardiovascular status: stable Anesthetic complications: no     Last Vitals:  Vitals:   01/12/18 0803 01/12/18 0928  BP: (!) 153/88 (!) 158/98  Pulse: 69 67  Resp:  12  Temp: (!) 36.2 C 37.1 C  SpO2: 98% 100%    Last Pain:  Vitals:   01/12/18 0928  TempSrc:   PainSc: Asleep                 Kelicia Youtz K

## 2018-01-12 NOTE — Anesthesia Post-op Follow-up Note (Signed)
Anesthesia QCDR form completed.        

## 2018-01-12 NOTE — Progress Notes (Signed)
PT worked with patient.Patient was unable to ambulate with PT.Safety sitter with patient.

## 2018-01-12 NOTE — H&P (Signed)
Psychiatric Admission Assessment Adult  Patient Identification: [name] MRN:  [id number] Date of Evaluation:  01/12/2018 Chief Complaint:  Depression Principal Diagnosis: Severe recurrent major depression without psychotic features [facility]) Diagnosis:   Patient Active Problem List   Diagnosis Date Noted  . Catatonia [F06.1] 01/12/2018  . Malnutrition of moderate degree [E44.0] 01/09/2018  . Pressure injury of skin [L89.90] 01/09/2018  . Severe recurrent major depression without psychotic features ([place]) [F33.2] 01/09/2018  . Back pain [M54.9] 01/08/2018  . Shuffling gait [R26.89] 01/08/2018  . Depression [F32.9] 01/08/2018   History of Present Illness: 62 year old man transferred from the medical service with severe depression with near catatonic presentation.  Beginning ECT treatment.  Patient continues to complain of depressed mood anxiety and hopelessness but he is not having psychotic symptoms and he is not acting out.  Denies suicidal intent or plan.  Basically cooperative with treatment.  Had he a first ECT treatment this morning.  This afternoon patient appears to be even more catatonic very weak little activity.  Seems to be even less able to take care of himself than he was yesterday. Associated Signs/Symptoms: Depression Symptoms:  depressed mood, anhedonia, psychomotor retardation, (Hypo) Manic Symptoms:  None Anxiety Symptoms:  Excessive Worry, Psychotic Symptoms:  None PTSD Symptoms: Negative Total Time spent with patient: 1 hour  Past Psychiatric History: Patient has a history of depression going back several years but has never had a hospitalization before.  Family had been taking care of him for years despite his symptoms which have only gotten worse in the last few months.  Previously had not tolerated or cooperated with antidepressant medication.  No previous ECT before today.  Is the patient at risk to self? Yes.    Has the patient been a risk to self in the past  6 months? Yes.    Has the patient been a risk to self within the distant past? No.  Is the patient a risk to others? No.  Has the patient been a risk to others in the past 6 months? No.  Has the patient been a risk to others within the distant past? No.   Prior Inpatient Therapy:   Prior Outpatient Therapy:    Alcohol Screening:   Substance Abuse History in the last 12 months:  No. Consequences of Substance Abuse: Negative Previous Psychotropic Medications: No  Psychological Evaluations: No  Past Medical History:  Past Medical History:  Diagnosis Date  . Depression     Past Surgical History:  Procedure Laterality Date  . HERNIA REPAIR     Family History: History reviewed. No pertinent family history. Family Psychiatric  History: No history of depression Tobacco Screening:   Social History:  Social History   Substance and Sexual Activity  Alcohol Use Not on file     Social History   Substance and Sexual Activity  Drug Use Not Currently    Additional Social History:                           Allergies:  No Known Allergies Lab Results:  Results for orders placed or performed during the hospital encounter of 01/08/18 (from the past 48 hour(s))  CK     Status: Abnormal   Collection Time: 01/12/18  4:57 AM  Result Value Ref Range   Total CK 523 (H) 49 - 397 U/L    Comment: Performed at [facility], [address], [place], [place] [address]  Blood Alcohol level:  Lab Results  Component Value Date   ETH <10 74/02/1447    Metabolic Disorder Labs:  No results found for: HGBA1C, MPG No results found for: PROLACTIN No results found for: CHOL, TRIG, HDL, CHOLHDL, VLDL, LDLCALC  Current Medications: Current Facility-Administered Medications  Medication Dose Route Frequency Provider Last Rate Last Dose  . acetaminophen (TYLENOL) tablet 650 mg  650 mg Oral Q6H PRN Jotham Ahn T, MD      . alum & mag hydroxide-simeth (MAALOX/MYLANTA)  200-200-20 MG/5ML suspension 30 mL  30 mL Oral Q4H PRN Khristine Verno T, MD      . feeding supplement (ENSURE ENLIVE) (ENSURE ENLIVE) liquid 237 mL  237 mL Oral BID BM Lajuan Godbee T, MD      . ibuprofen (ADVIL,MOTRIN) tablet 400 mg  400 mg Oral Q6H PRN Catie Chiao T, MD      . magnesium hydroxide (MILK OF MAGNESIA) suspension 30 mL  30 mL Oral Daily PRN Kyndall Chaplin T, MD      . mirtazapine (REMERON) tablet 15 mg  15 mg Oral QHS Carilyn Woolston T, MD      . multivitamin with minerals tablet 1 tablet  1 tablet Oral Daily Arthur Speagle T, MD      . pantoprazole (PROTONIX) EC tablet 40 mg  40 mg Oral Daily Kerwin Augustus T, MD      . traZODone (DESYREL) tablet 100 mg  100 mg Oral QHS PRN Severo Beber, Madie Reno, MD       PTA Medications: Medications Prior to Admission  Medication Sig Dispense Refill Last Dose  . feeding supplement, ENSURE ENLIVE, (ENSURE ENLIVE) LIQD Take 237 mLs by mouth 2 (two) times daily between meals. 237 mL 12   . ibuprofen (ADVIL,MOTRIN) 600 MG tablet Take 1 tablet (600 mg total) by mouth every 6 (six) hours as needed for moderate pain. 30 tablet 0   . Multiple Vitamin (MULTIVITAMIN WITH MINERALS) TABS tablet Take 1 tablet by mouth daily. 30 tablet 0   . predniSONE (STERAPRED UNI-PAK 21 TAB) 10 MG (21) TBPK tablet Taper by 10 mg daily 21 tablet 0   . vitamin B-12 1000 MCG tablet Take 1 tablet (1,000 mcg total) by mouth daily. 30 tablet 0   . [START ON 01/17/2018] Vitamin D, Ergocalciferol, (DRISDOL) 50000 units CAPS capsule Take 1 capsule (50,000 Units total) by mouth every 7 (seven) days. 5 capsule 0     Musculoskeletal: Strength & Muscle Tone: decreased and atrophy Gait & Station: unable to stand Patient leans: N/A  Psychiatric Specialty Exam: Physical Exam  Nursing note and vitals reviewed. Constitutional: He appears well-developed and well-nourished.  HENT:  Head: Normocephalic and atraumatic.  Eyes: Pupils are equal, round, and reactive to light. Conjunctivae are  normal.  Neck: Normal range of motion.  Cardiovascular: Regular rhythm and normal heart sounds.  Respiratory: He is in respiratory distress.  GI: Soft.  Musculoskeletal: Normal range of motion.  Neurological: He is alert.  Skin: Skin is warm and dry.  Psychiatric: His affect is blunt. His speech is delayed. He is slowed and withdrawn. Cognition and memory are impaired. He expresses no homicidal and no suicidal ideation.    Review of Systems  Constitutional: Positive for malaise/fatigue and weight loss.  HENT: Negative.   Eyes: Negative.   Respiratory: Negative.   Cardiovascular: Negative.   Gastrointestinal: Negative.   Musculoskeletal: Negative.   Skin: Negative.   Neurological: Positive for weakness.  Psychiatric/Behavioral: Positive for depression and memory loss.  Negative for hallucinations, substance abuse and suicidal ideas. The patient is nervous/anxious and has insomnia.     Blood pressure 128/78, pulse 98, temperature 98.2 F (36.8 C), temperature source Oral, resp. rate 18, height 5\' 10"  (1.778 m), weight 68.9 kg (152 lb), SpO2 99 %.Body mass index is 21.81 kg/m.  General Appearance: Casual  Eye Contact:  Minimal  Speech:  Slow  Volume:  Decreased  Mood:  Anxious and Depressed  Affect:  Blunt  Thought Process:  Coherent  Orientation:  Full (Time, Place, and Person)  Thought Content:  Logical  Suicidal Thoughts:  No  Homicidal Thoughts:  No  Memory:  Immediate;   Fair Recent;   Fair Remote;   Fair  Judgement:  Fair  Insight:  Fair  Psychomotor Activity:  Decreased  Concentration:  Concentration: Fair  Recall:  [employer] of Knowledge:  Fair  Language:  Fair  Akathisia:  No  Handed:  Right  AIMS (if indicated):     Assets:  Desire for Improvement Financial Resources/Insurance Housing Social Support  ADL's:  Impaired  Cognition:  WNL  Sleep:       Treatment Plan Summary: Daily contact with patient to assess and evaluate symptoms and progress in  treatment, Medication management and Plan Patient transferred to the psychiatric ward.  He appears to be even more catatonic today than yesterday.  We are beginning ECT treatment as primary treatment for his depression and catatonia.  I am not going to start benzodiazepines at this point because I am optimistic that he may improve some of his physical activity I do not want him to be oversedated or at higher risk of falls.  I am going to add mirtazapine 15 mg at night for depression and to assist with sleep.  Reviewed plan with nursing.  Unfortunately he will need a sitter for now.  Hopefully he will regain the ability to feed himself over the weekend.  Labs all reviewed nothing new otherwise ordered next ECT will be on Monday.  Observation Level/Precautions:  1 to 1  Laboratory:  HbAIC  Psychotherapy:    Medications:    Consultations:    Discharge Concerns:    Estimated LOS:  Other:     Physician Treatment Plan for Primary Diagnosis: Severe recurrent major depression without psychotic features ([place]) Long Term Goal(s): Improvement in symptoms so as ready for discharge  Short Term Goals: Ability to demonstrate self-control will improve and Ability to identify and develop effective coping behaviors will improve  Physician Treatment Plan for Secondary Diagnosis: Principal Problem:   Severe recurrent major depression without psychotic features ([place]) Active Problems:   Catatonia  Long Term Goal(s): Improvement in symptoms so as ready for discharge  Short Term Goals: Ability to maintain clinical measurements within normal limits will improve and Compliance with prescribed medications will improve  I certify that inpatient services furnished can reasonably be expected to improve the patient's condition.    Alethia Berthold, MD 7/5/20193:05 PM

## 2018-01-13 NOTE — Progress Notes (Signed)
Patient continues to be on 1:1, affect is flat, but brightens upon approach no distress noted, sitter at bedside will continue to monitor.

## 2018-01-13 NOTE — Progress Notes (Signed)
patient alert and oriented x 2, sitter at bedside no distress noted, will continue to closely monitor.

## 2018-01-13 NOTE — Progress Notes (Signed)
Hourly rounding: 0800: Up to dayroom eating breakfast.  1:1 maintained for safety. 0900: Sitting up in wheelchair in room.  1:1 maintained for safety. 1000: Sitting up in wheelchair in room.  1:1 maintained for safety.  1100: Resting in bed with eyes open.  1:1 maintained for safety. 1230: In dayroom eating lunch.  1:1 maintained for safety. 1300: Sitting in room looking out the window.  1:1 maintained. 1400: Resting quietly in bed with eyes closed.  1:1 maintained 1500: Resting quietly in bed with eyes closed.  1:1 maintained 1600: Resting quietly in bed with eyes closed.  1:1 maintained 1700: Up to community room eating dinner and visiting with grandson.  1:1 maintained.  1800: Visiting with grandson in craft room.  1:1 maintained.   1900: Resting in bed.  1:1 maintained.

## 2018-01-13 NOTE — Progress Notes (Signed)
Patient continues to be on 1:1 no distress noted, sitter at bedside.  

## 2018-01-13 NOTE — Progress Notes (Signed)
Patient is alert and oriented x 2 with periods of confusion to place and situation, affect is flat speech is slow, denies SI/HI/AVH, on 1:1 for safety; sitter at bedside no distress noted 15 minutes safety checks maintained, will continue to closely monitor.

## 2018-01-13 NOTE — [facility] Group Notes (Signed)
[facility] Group Notes:  (Nursing/MHT/Case Management/Adjunct)  Date:  01/13/2018  Time:  10:51 PM  Type of Therapy:  Group Therapy  Participation Level:  Did Not Attend    Barnie Mort 01/13/2018, 10:51 PM

## 2018-01-13 NOTE — Progress Notes (Deleted)
Psychiatric Admission Assessment Adult  Patient Identification: [name] MRN:  [id number] Date of Evaluation:  01/13/2018 Chief Complaint:  Depression Principal Diagnosis: Severe recurrent major depression without psychotic features [facility]) Diagnosis:   Patient Active Problem List   Diagnosis Date Noted  . Catatonia [F06.1] 01/12/2018  . Malnutrition of moderate degree [E44.0] 01/09/2018  . Pressure injury of skin [L89.90] 01/09/2018  . Severe recurrent major depression without psychotic features ([place]) [F33.2] 01/09/2018  . Back pain [M54.9] 01/08/2018  . Shuffling gait [R26.89] 01/08/2018  . Depression [F32.9] 01/08/2018   History of Present Illness: 62 year old man transferred from the medical service with severe depression with near catatonic presentation.on  ECT treatment.   Pt seen in his room, pt in [facility] , sitter in room. Pt reportedly need assistance with ADL. Pt withdrwan, speaks very slow, soft, reports feeling depressed although slightly better today, denies SI. Taking meds , denies side effects, next ECT on mOnday.   Associated Signs/Symptoms: Depression Symptoms:  depressed mood, anhedonia, psychomotor retardation, (Hypo) Manic Symptoms:  None Anxiety Symptoms:  Excessive Worry, Psychotic Symptoms:  None PTSD Symptoms: Negative Total Time spent with patient: 1 hour  Past Psychiatric History: no new info  Is the patient at risk to self? Yes.    Has the patient been a risk to self in the past 6 months? Yes.    Has the patient been a risk to self within the distant past? No.  Is the patient a risk to others? No.  Has the patient been a risk to others in the past 6 months? No.  Has the patient been a risk to others within the distant past? No.   Prior Inpatient Therapy:   Prior Outpatient Therapy:    Alcohol Screening: 1. How often do you have a drink containing alcohol?: Never 2. How many drinks containing alcohol do you have on a typical day when you are  drinking?: 1 or 2 3. How often do you have six or more drinks on one occasion?: Never AUDIT-C Score: 0 4. How often during the last year have you found that you were not able to stop drinking once you had started?: Never 5. How often during the last year have you failed to do what was normally expected from you becasue of drinking?: Never 6. How often during the last year have you needed a first drink in the morning to get yourself going after a heavy drinking session?: Never 7. How often during the last year have you had a feeling of guilt of remorse after drinking?: Never 8. How often during the last year have you been unable to remember what happened the night before because you had been drinking?: Never 9. Have you or someone else been injured as a result of your drinking?: No 10. Has a relative or friend or a doctor or another health worker been concerned about your drinking or suggested you cut down?: No Alcohol Use Disorder Identification Test Final Score (AUDIT): 0 Intervention/Follow-up: AUDIT Score <7 follow-up not indicated Substance Abuse History in the last 12 months:  No. Consequences of Substance Abuse: Negative Previous Psychotropic Medications: No  Psychological Evaluations: No  Past Medical History:  Past Medical History:  Diagnosis Date  . Depression     Past Surgical History:  Procedure Laterality Date  . HERNIA REPAIR     Family History: History reviewed. No pertinent family history. Family Psychiatric  History: No history of depression Tobacco Screening: Have you used any form of tobacco in  the last 30 days? (Cigarettes, Smokeless Tobacco, Cigars, and/or Pipes): No Social History:  Social History   Substance and Sexual Activity  Alcohol Use Not on file     Social History   Substance and Sexual Activity  Drug Use Not Currently    Additional Social History:                           Allergies:  No Known Allergies Lab Results:  Results for  orders placed or performed during the hospital encounter of 01/08/18 (from the past 48 hour(s))  CK     Status: Abnormal   Collection Time: 01/12/18  4:57 AM  Result Value Ref Range   Total CK 523 (H) 49 - 397 U/L    Comment: Performed at [facility], [address] [place], [place] [address]    Blood Alcohol level:  Lab Results  Component Value Date   ETH <10 19/14/7829    Metabolic Disorder Labs:  No results found for: HGBA1C, MPG No results found for: PROLACTIN No results found for: CHOL, TRIG, HDL, CHOLHDL, VLDL, LDLCALC  Current Medications: Current Facility-Administered Medications  Medication Dose Route Frequency Provider Last Rate Last Dose  . acetaminophen (TYLENOL) tablet 650 mg  650 mg Oral Q6H PRN Clapacs, John T, MD      . alum & mag hydroxide-simeth (MAALOX/MYLANTA) 200-200-20 MG/5ML suspension 30 mL  30 mL Oral Q4H PRN Clapacs, John T, MD      . feeding supplement (ENSURE ENLIVE) (ENSURE ENLIVE) liquid 237 mL  237 mL Oral BID BM Clapacs, John T, MD   237 mL at 01/13/18 1014  . ibuprofen (ADVIL,MOTRIN) tablet 400 mg  400 mg Oral Q6H PRN Clapacs, Madie Reno, MD   400 mg at 01/12/18 2222  . magnesium hydroxide (MILK OF MAGNESIA) suspension 30 mL  30 mL Oral Daily PRN Clapacs, John T, MD      . mirtazapine (REMERON) tablet 15 mg  15 mg Oral QHS Clapacs, John T, MD   15 mg at 01/12/18 2216  . multivitamin with minerals tablet 1 tablet  1 tablet Oral Daily Clapacs, Madie Reno, MD   1 tablet at 01/13/18 0933  . pantoprazole (PROTONIX) EC tablet 40 mg  40 mg Oral Daily Clapacs, Madie Reno, MD   40 mg at 01/13/18 0932  . traZODone (DESYREL) tablet 100 mg  100 mg Oral QHS PRN Clapacs, Madie Reno, MD   100 mg at 01/12/18 2216   PTA Medications: Medications Prior to Admission  Medication Sig Dispense Refill Last Dose  . feeding supplement, ENSURE ENLIVE, (ENSURE ENLIVE) LIQD Take 237 mLs by mouth 2 (two) times daily between meals. 237 mL 12   . ibuprofen (ADVIL,MOTRIN) 600 MG  tablet Take 1 tablet (600 mg total) by mouth every 6 (six) hours as needed for moderate pain. 30 tablet 0   . Multiple Vitamin (MULTIVITAMIN WITH MINERALS) TABS tablet Take 1 tablet by mouth daily. 30 tablet 0   . predniSONE (STERAPRED UNI-PAK 21 TAB) 10 MG (21) TBPK tablet Taper by 10 mg daily 21 tablet 0   . vitamin B-12 1000 MCG tablet Take 1 tablet (1,000 mcg total) by mouth daily. 30 tablet 0   . [START ON 01/17/2018] Vitamin D, Ergocalciferol, (DRISDOL) 50000 units CAPS capsule Take 1 capsule (50,000 Units total) by mouth every 7 (seven) days. 5 capsule 0     Musculoskeletal: Strength & Muscle Tone: decreased and atrophy Gait &  Station: unable to stand Patient leans: N/A  Psychiatric Specialty Exam: Physical Exam  Nursing note and vitals reviewed. Cardiovascular: Regular rhythm.  Respiratory: He is in respiratory distress.  Neurological: He is alert.  Psychiatric: His affect is blunt. His speech is delayed. He is slowed and withdrawn. Cognition and memory are impaired. He expresses no homicidal and no suicidal ideation.    Review of Systems  Constitutional: Positive for malaise/fatigue and weight loss.  HENT: Negative.   Eyes: Negative.   Respiratory: Negative.   Cardiovascular: Negative.   Gastrointestinal: Negative.   Musculoskeletal: Negative.   Skin: Negative.   Neurological: Positive for weakness.  Psychiatric/Behavioral: Positive for depression and memory loss. Negative for hallucinations, substance abuse and suicidal ideas. The patient is nervous/anxious and has insomnia.     Blood pressure (!) 95/59, pulse 65, temperature 97.6 F (36.4 C), temperature source Oral, resp. rate 20, height 5\' 10"  (1.778 m), weight 68.9 kg (152 lb), SpO2 97 %.Body mass index is 21.81 kg/m.  General Appearance: Casual  Eye Contact:  Minimal  Speech:  Slow  Volume:  Decreased  Mood:  Anxious and Depressed  Affect:  flat  Thought Process:  Coherent  Orientation:  Full (Time, Place, and  Person)  Thought Content:  Logical  Suicidal Thoughts:  No, denies  Homicidal Thoughts:  No  Memory:  Immediate;   Fair Recent;   Fair Remote;   Fair  Judgement:  Fair  Insight:  Fair  Psychomotor Activity:  Decreased  Concentration:  Concentration: Fair  Recall:  [employer] of Knowledge:  Fair  Language:  Fair  Akathisia:  No  Handed:  Right  AIMS (if indicated):     Assets:  Desire for Improvement Financial Resources/Insurance Housing Social Support  ADL's:  Impaired  Cognition:  WNL  Sleep:  Number of Hours: 5.75    Treatment Plan Summary: Pt with depression and catatonia. Pt still depressed,  Cont Remeron, will have 2nd ECT on Monday, cont 1:1 .   Observation Level/Precautions:  1 to 1  Laboratory:  HbAIC  Psychotherapy:    Medications:    Consultations:    Discharge Concerns:    Estimated LOS:  Other:     Physician Treatment Plan for Primary Diagnosis: Severe recurrent major depression without psychotic features ([place]) Long Term Goal(s): Improvement in symptoms so as ready for discharge  Short Term Goals: Ability to demonstrate self-control will improve and Ability to identify and develop effective coping behaviors will improve  Physician Treatment Plan for Secondary Diagnosis: Principal Problem:   Severe recurrent major depression without psychotic features ([place]) Active Problems:   Catatonia  Long Term Goal(s): Improvement in symptoms so as ready for discharge  Short Term Goals: Ability to maintain clinical measurements within normal limits will improve and Compliance with prescribed medications will improve  I certify that inpatient services furnished can reasonably be expected to improve the patient's condition.    [name], MD 7/6/20194:02 PMPatient ID: [name], male   DOB: [birth year], 76 y.o.   MRN: [id number]

## 2018-01-13 NOTE — Plan of Care (Signed)
Affect blunted.  Poor eye contact.  Denies SI/HI/AVH. Answers yes and no questions with no elaboration.  Reluctant to get out of bed and to perform  own ADL's.  Up to wheelchair with great deal of encouragement.  1:1 sitter for safety.  Support offered.   Problem: Safety: Goal: Periods of time without injury will increase Outcome: Progressing   Problem: Safety: Goal: Ability to disclose and discuss suicidal ideas will improve Outcome: Progressing

## 2018-01-13 NOTE — BHH Group Notes (Signed)
LCSW Group Therapy Note  01/13/2018 1:15pm  Type of Therapy and Topic:  Group Therapy:  Feelings around Relapse and Recovery  Participation Level:  Did Not Attend   Description of Group:    Patients in this group will discuss emotions they experience before and after a relapse. They will process how experiencing these feelings, or avoidance of experiencing them, relates to having a relapse. Facilitator will guide patients to explore emotions they have related to recovery. Patients will be encouraged to process which emotions are more powerful. They will be guided to discuss the emotional reaction significant others in their lives may have to their relapse or recovery. Patients will be assisted in exploring ways to respond to the emotions of others without this contributing to a relapse.  Therapeutic Goals: 1. Patient will identify two or more emotions that lead to a relapse for them 2. Patient will identify two emotions that result when they relapse 3. Patient will identify two emotions related to recovery 4. Patient will demonstrate ability to communicate their needs through discussion and/or role plays   Summary of Patient Progress:     Therapeutic Modalities:   Cognitive Behavioral Therapy Solution-Focused Therapy Assertiveness Training Relapse Prevention Therapy   August Saucer, LCSW 01/13/2018 2:09 PM

## 2018-01-14 NOTE — Progress Notes (Signed)
Hourly rounding: 08:00 Up to dayroom eating breakfast.  1:1 for safety 09:00 Outside in courtyard 1:1 for safety 10:00  Taking a shower.  1:1 maintained 11:00 Sitting in room.  1:1 maintained 12:00 In dayroom watching TV.  1:1 maintained 13:00 resting in bed.  1:1 maintained 14:00 Resting in bed with eyes closed.  1:1 maintained 15:00 Resting in bed.  1:1 maintained.  16:00 Resting in bed 1:1 maintained 17:00 Eating.  1:1 maintained 18:00 Dayroom.1:1 maintained 19:00 Resting in bed.

## 2018-01-14 NOTE — BHH Group Notes (Signed)
LCSW Group Therapy Notes 01/14/2018 1:00pm Type of Therapy and Topic:  Group Therapy:  Communication Participation Level:  Did Not Attend  Description of Group: Patients will identify how individuals communicate with one another appropriately and inappropriately.  Patients will be guided to discuss their thoughts, feelings and behaviors related to barriers when communicating.  The group will process together ways to execute positive and appropriate communication with attention given to how one uses behavior, tone and body language.  Patients will be encouraged to reflect on a situation where they were successfully able to communicate and what made this example successful.  Group will identify specific changes they are motivated to make in order to overcome communication barriers with self, peers, authority, and parents.  This group will be process-oriented with patients participating in exploration of their own experiences, giving and receiving support, and challenging self and other group members.   Therapeutic Goals 1. Patient will identify how people communicate (body language, facial expression, and electronics).  Group will also discuss tone, voice and how these impact what is communicated and what is received. 2. Patient will identify feelings (such as fear or worry), thought process and behaviors related to why people internalize feelings rather than express self openly. 3. Patient will identify two changes they are willing to make to overcome communication barriers 4. Members will then practice through role play how to communicate using I statements, I feel statements, and acknowledging feelings rather than displacing feelings on others Summary of Patient Progress:   Therapeutic Modalities Cognitive Behavioral Therapy Motivational Interviewing Solution Focused [place], LCSW 01/14/2018 3:10 PM

## 2018-01-14 NOTE — BHH Counselor (Signed)
Adult Comprehensive Assessment  Patient ID: [name], male   DOB: Dec 07, 1955, 62 y.o.   MRN: [id number]  Information Source: Information source: Patient  Current Stressors:  Patient states their primary concerns and needs for treatment are:: depression Patient states their goals for this hospitilization and ongoing recovery are:: to improve depression and get stronger physically. Family Relationships: recently seperated, wife of 88 yrs left him Bereavement / Loss: grieving marriage  Living/Environment/Situation:  Living Arrangements: Children(lives with his step-grandson) Who else lives in the home?: grandson [name] What is atmosphere in current home: Comfortable, Supportive  Family History:  Marital status: Separated Separated, when?: 3/4 months ago she left him What types of issues is patient dealing with in the relationship?: he says this was a surprise and he was not expecting this. Are you sexually active?: No What is your sexual orientation?: heterosexual Does patient have children?: Yes How many children?: 1 How is patient's relationship with their children?: step daughter  Childhood History:  By whom was/is the patient raised?: Both parents Does patient have siblings?: Yes Number of Siblings: 3 Description of patient's current relationship with siblings: hasn't spoken with sister since 2014 when his mother passed, other siblings are deceased Did patient suffer any verbal/emotional/physical/sexual abuse as a child?: No Did patient suffer from severe childhood neglect?: No Has patient ever been sexually abused/assaulted/raped as an adolescent or adult?: No Was the patient ever a victim of a crime or a disaster?: No Witnessed domestic violence?: No Has patient been effected by domestic violence as an adult?: No  Education:  Currently a Ship broker?: No Learning disability?: No  Employment/Work Situation:   Employment situation: Retired Chartered loss adjuster is the longest time patient  has a held a job?: Pt worked for [employer] for 22 years and lost his jobe when he was layed off in 2010 Did You Receive Any Psychiatric Treatment/Services While in the [employer]?: No Are There Guns or Other Weapons in [place]?: No Are These Weapons Safely Secured?: No  Financial Resources:   Does patient have a representative payee or guardian?: No  Alcohol/Substance Abuse:   If attempted suicide, did drugs/alcohol play a role in this?: No Alcohol/Substance Abuse Treatment Hx: Denies past history Has alcohol/substance abuse ever caused legal problems?: No  Social Support System:   Patient's Community Support System: Poor  Leisure/Recreation:   Leisure and Hobbies: watching TV  Strengths/Needs:   Patient states these barriers may affect/interfere with their treatment: physical needs, he is unsure of what he needs now.  Discharge Plan:   Currently receiving community mental health services: No Patient states concerns and preferences for aftercare planning are: TBD, he has been to [facility] in the past for crisis evaluations Does patient have access to transportation?: Yes(grandson transports to appointments) Does patient have financial barriers related to discharge medications?: No Will patient be returning to same living situation after discharge?: Yes  Summary/Recommendations:   Summary and Recommendations (to be completed by the evaluator): Pt is 62yo male admited to BMU with a plan for ECT treatment.  He verbalizes that his physical weakness has had a sudden onset.  His seperation with his wife has also been very stressful.  While on the unit he will have the opportunity to participate in groups and therapeutic milieu.  He will have medications managed and assistance with appropriate discharge planning. Reccomend crisis stabilization and continue to follow wellness plan at discharge.  [place].LCSW 01/14/2018

## 2018-01-14 NOTE — Progress Notes (Signed)
[facility] MD Progress Note  01/14/2018 5:41 PM [name]  MRN:  [id number] Subjective:    I cant do things for myself"  62 year old man transferred from the medical service with severe depression with near catatonic presentation.on  ECT treatment.   Pt seen in his room, pt in [facility] , sitter in room. Pt  has severe psychomotor retardation,  needs assistance with ADL. Pt withdrwan, speaks very slow, soft, reports feeling depressed , reports poor appetite,  denies SI. Taking meds , denies side effects, next ECT tomorrow.    Principal Problem: Severe recurrent major depression without psychotic features ([place]) Diagnosis:   Patient Active Problem List   Diagnosis Date Noted  . Catatonia [F06.1] 01/12/2018  . Malnutrition of moderate degree [E44.0] 01/09/2018  . Pressure injury of skin [L89.90] 01/09/2018  . Severe recurrent major depression without psychotic features ([place]) [F33.2] 01/09/2018  . Back pain [M54.9] 01/08/2018  . Shuffling gait [R26.89] 01/08/2018  . Depression [F32.9] 01/08/2018   Total Time spent with patient: 25 min   Past Psychiatric History: no ne info  Past Medical History:  Past Medical History:  Diagnosis Date  . Depression     Past Surgical History:  Procedure Laterality Date  . HERNIA REPAIR     Family History: History reviewed. No pertinent family history. Family Psychiatric  History: no new info  Social History:  Social History   Substance and Sexual Activity  Alcohol Use Not on file     Social History   Substance and Sexual Activity  Drug Use Not Currently    Social History   Socioeconomic History  . Marital status: Married    Spouse name: Not on file  . Number of children: Not on file  . Years of education: Not on file  . Highest education level: Not on file  Occupational History  . Not on file  Social Needs  . Financial resource strain: Not on file  . Food insecurity:    Worry: Not on file    Inability: Not on file  . Transportation  needs:    Medical: Not on file    Non-medical: Not on file  Tobacco Use  . Smoking status: Former Research scientist (life sciences)  . Smokeless tobacco: Former Systems developer    Quit date: 01/10/2017  Substance and Sexual Activity  . Alcohol use: Not on file  . Drug use: Not Currently  . Sexual activity: Not Currently  Lifestyle  . Physical activity:    Days per week: Not on file    Minutes per session: Not on file  . Stress: Not on file  Relationships  . Social connections:    Talks on phone: Not on file    Gets together: Not on file    Attends religious service: Not on file    Active member of club or organization: Not on file    Attends meetings of clubs or organizations: Not on file    Relationship status: Not on file  Other Topics Concern  . Not on file  Social History Narrative  . Not on file   Additional Social History:                         Sleep: Fair  Appetite:  Poor  Current Medications: Current Facility-Administered Medications  Medication Dose Route Frequency Provider Last Rate Last Dose  . acetaminophen (TYLENOL) tablet 650 mg  650 mg Oral Q6H PRN Clapacs, Madie Reno, MD      .  alum & mag hydroxide-simeth (MAALOX/MYLANTA) 200-200-20 MG/5ML suspension 30 mL  30 mL Oral Q4H PRN Clapacs, John T, MD      . feeding supplement (ENSURE ENLIVE) (ENSURE ENLIVE) liquid 237 mL  237 mL Oral BID BM Clapacs, John T, MD   237 mL at 01/14/18 1315  . ibuprofen (ADVIL,MOTRIN) tablet 400 mg  400 mg Oral Q6H PRN Clapacs, Madie Reno, MD   400 mg at 01/12/18 2222  . magnesium hydroxide (MILK OF MAGNESIA) suspension 30 mL  30 mL Oral Daily PRN Clapacs, John T, MD      . mirtazapine (REMERON) tablet 15 mg  15 mg Oral QHS Clapacs, Madie Reno, MD   15 mg at 01/13/18 2126  . multivitamin with minerals tablet 1 tablet  1 tablet Oral Daily Clapacs, Madie Reno, MD   1 tablet at 01/14/18 [phone]  . pantoprazole (PROTONIX) EC tablet 40 mg  40 mg Oral Daily Clapacs, Madie Reno, MD   40 mg at 01/14/18 0626  . traZODone (DESYREL) tablet  100 mg  100 mg Oral QHS PRN Clapacs, Madie Reno, MD   100 mg at 01/12/18 2216    Lab Results: No results found for this or any previous visit (from the past 48 hour(s)).  Blood Alcohol level:  Lab Results  Component Value Date   ETH <10 94/85/4627    Metabolic Disorder Labs: No results found for: HGBA1C, MPG No results found for: PROLACTIN No results found for: CHOL, TRIG, HDL, CHOLHDL, VLDL, LDLCALC  Physical Findings: AIMS:  , ,  ,  ,    CIWA:    COWS:     Musculoskeletal: Strength & Muscle Tone: decreased Gait & Station: unsteady Patient leans:   Psychiatric Specialty Exam: Physical Exam  Nursing note and vitals reviewed.   ROS  Blood pressure (!) 106/59, pulse 71, temperature 98.5 F (36.9 C), temperature source Oral, resp. rate 19, height 5\' 10"  (1.778 m), weight 68.9 kg (152 lb), SpO2 97 %.Body mass index is 21.81 kg/m.  General Appearance: Casual  Eye Contact:  Minimal  Speech:  very Slow  Volume:  Decreased  Mood:  Anxious and Depressed  Affect:  flat  Thought Process:  Coherent  Orientation:  Full (Time, Place, and Person)  Thought Content: depressed  Suicidal Thoughts:  No, denies  Homicidal Thoughts:  No  Memory:  Immediate;   Fair Recent;   Fair Remote;   Fair  Judgement:  Fair  Insight:  Fair  Psychomotor Activity:  Decreased  Concentration:  Concentration: Fair  Recall:  [employer] of Knowledge:  Fair  Language:  Fair  Akathisia:  No  Handed:  Right  AIMS (if indicated):     Assets:  Desire for Improvement Financial Resources/Insurance Housing Social Support  ADL's:  Impaired  Cognition:  WNL  Sleep:  Number of Hours: 5.75        Treatment Plan Summary: Daily contact with patient to assess and evaluate symptoms and progress in treatment and Medication management. Plan-  Pt with depression and catatonia. Pt still depressed, severe psychomotor retardation.   Cont Remeron, will have 2nd ECT tomorrow, cont 1:1 .  Group/milieu tx.      [name], MD 01/14/2018, 5:41 PMPatient ID: [name], male   DOB: [birth year], 77 y.o.   MRN: [id number]

## 2018-01-14 NOTE — Plan of Care (Signed)
Affect flat, poor eye contact.  Moves slowly. Reluctant to move.  Up to wheelchair with encouragement.  Up to dayroom with meals.  Assisted with shower today.  No group attendance. Speech soft and slow.  Requires verbal prompts.  Support offered.  Safety maintained.  Safety sitter at bedside. Problem: Activity: Goal: Interest or engagement in activities will improve Outcome: Progressing   Problem: Safety: Goal: Periods of time without injury will increase Outcome: Progressing   Problem: Activity: Goal: Interest or engagement in leisure activities will improve Outcome: Progressing   Problem: Safety: Goal: Ability to disclose and discuss suicidal ideas will improve Outcome: Progressing

## 2018-01-14 NOTE — [facility] Suicide Risk Assessment (Signed)
[place] INPATIENT:  Family/Significant Other Suicide Prevention Education  Suicide Prevention Education:  Contact Attempts: Anastasia Pall, Yolanda Bonine, (name of family member/significant other) has been identified by the patient as the family member/significant other with whom the patient will be residing, and identified as the person(s) who will aid the patient in the event of a mental health crisis.  With written consent from the patient, two attempts were made to provide suicide prevention education, prior to and/or following the patient's discharge.  We were unsuccessful in providing suicide prevention education.  A suicide education pamphlet was given to the patient to share with family/significant other.  Date and time of first attempt: 01/14/2018, 4:30-called [phone] told by a male that answered the phone that this was the wrong number. Date and time of second attempt:01/14/2018, 4:35 called Pt's home number in chart and left a voicemail  August Saucer, LCSW 01/14/2018, 4:28 PM

## 2018-01-14 NOTE — Plan of Care (Signed)
Pt in room with a sitter. Pt was able to take medication by himself. Pt denies SI/HI. Pt is receptive to treatment and safety maintained on unit. Will continue to monitor. Problem: Activity: Goal: Interest or engagement in activities will improve Outcome: Progressing Goal: Sleeping patterns will improve Outcome: Progressing   Problem: Coping: Goal: Ability to verbalize frustrations and anger appropriately will improve Outcome: Progressing   Problem: Safety: Goal: Periods of time without injury will increase Outcome: Progressing   Problem: Activity: Goal: Interest or engagement in leisure activities will improve Outcome: Progressing   Problem: Coping: Goal: Will verbalize feelings Outcome: Progressing   Problem: Safety: Goal: Ability to disclose and discuss suicidal ideas will improve Outcome: Progressing

## 2018-01-14 NOTE — Progress Notes (Signed)
[facility] MD Progress Note  01/14/2018 5:35 PM [name]  MRN:  [id number] Subjective:   I'm feeling sad"  62 year old man transferred from the medical service with severe depression with near catatonic presentation.on  ECT treatment.   Pt seen in his room, pt in [facility] , sitter in room. Pt reportedly need assistance with ADL. Pt withdrwan, speaks very slow, soft, reports feeling depressed although slightly better today, denies SI. Taking meds , denies side effects, next ECT on monday.   Principal Problem: Severe recurrent major depression without psychotic features ([place]) Diagnosis:   Patient Active Problem List   Diagnosis Date Noted  . Catatonia [F06.1] 01/12/2018  . Malnutrition of moderate degree [E44.0] 01/09/2018  . Pressure injury of skin [L89.90] 01/09/2018  . Severe recurrent major depression without psychotic features ([place]) [F33.2] 01/09/2018  . Back pain [M54.9] 01/08/2018  . Shuffling gait [R26.89] 01/08/2018  . Depression [F32.9] 01/08/2018   Total Time spent with patient: 25 min   Past Psychiatric History: no ne info  Past Medical History:  Past Medical History:  Diagnosis Date  . Depression     Past Surgical History:  Procedure Laterality Date  . HERNIA REPAIR     Family History: History reviewed. No pertinent family history. Family Psychiatric  History: no new info  Social History:  Social History   Substance and Sexual Activity  Alcohol Use Not on file     Social History   Substance and Sexual Activity  Drug Use Not Currently    Social History   Socioeconomic History  . Marital status: Married    Spouse name: Not on file  . Number of children: Not on file  . Years of education: Not on file  . Highest education level: Not on file  Occupational History  . Not on file  Social Needs  . Financial resource strain: Not on file  . Food insecurity:    Worry: Not on file    Inability: Not on file  . Transportation needs:    Medical: Not on file     Non-medical: Not on file  Tobacco Use  . Smoking status: Former Research scientist (life sciences)  . Smokeless tobacco: Former Systems developer    Quit date: 01/10/2017  Substance and Sexual Activity  . Alcohol use: Not on file  . Drug use: Not Currently  . Sexual activity: Not Currently  Lifestyle  . Physical activity:    Days per week: Not on file    Minutes per session: Not on file  . Stress: Not on file  Relationships  . Social connections:    Talks on phone: Not on file    Gets together: Not on file    Attends religious service: Not on file    Active member of club or organization: Not on file    Attends meetings of clubs or organizations: Not on file    Relationship status: Not on file  Other Topics Concern  . Not on file  Social History Narrative  . Not on file   Additional Social History:                         Sleep: Fair  Appetite:  Poor  Current Medications: Current Facility-Administered Medications  Medication Dose Route Frequency Provider Last Rate Last Dose  . acetaminophen (TYLENOL) tablet 650 mg  650 mg Oral Q6H PRN Clapacs, John T, MD      . alum & mag hydroxide-simeth (MAALOX/MYLANTA) 200-200-20 MG/5ML suspension 30  mL  30 mL Oral Q4H PRN Clapacs, John T, MD      . feeding supplement (ENSURE ENLIVE) (ENSURE ENLIVE) liquid 237 mL  237 mL Oral BID BM Clapacs, John T, MD   237 mL at 01/14/18 1315  . ibuprofen (ADVIL,MOTRIN) tablet 400 mg  400 mg Oral Q6H PRN Clapacs, Madie Reno, MD   400 mg at 01/12/18 2222  . magnesium hydroxide (MILK OF MAGNESIA) suspension 30 mL  30 mL Oral Daily PRN Clapacs, John T, MD      . mirtazapine (REMERON) tablet 15 mg  15 mg Oral QHS Clapacs, Madie Reno, MD   15 mg at 01/13/18 2126  . multivitamin with minerals tablet 1 tablet  1 tablet Oral Daily Clapacs, Madie Reno, MD   1 tablet at 01/14/18 [phone]  . pantoprazole (PROTONIX) EC tablet 40 mg  40 mg Oral Daily Clapacs, Madie Reno, MD   40 mg at 01/14/18 2197  . traZODone (DESYREL) tablet 100 mg  100 mg Oral QHS PRN Clapacs,  Madie Reno, MD   100 mg at 01/12/18 2216    Lab Results: No results found for this or any previous visit (from the past 48 hour(s)).  Blood Alcohol level:  Lab Results  Component Value Date   ETH <10 58/83/2549    Metabolic Disorder Labs: No results found for: HGBA1C, MPG No results found for: PROLACTIN No results found for: CHOL, TRIG, HDL, CHOLHDL, VLDL, LDLCALC  Physical Findings: AIMS:  , ,  ,  ,    CIWA:    COWS:     Musculoskeletal: Strength & Muscle Tone: decreased Gait & Station: unsteady Patient leans:   Psychiatric Specialty Exam: Physical Exam  Nursing note and vitals reviewed.   ROS  Blood pressure (!) 106/59, pulse 71, temperature 98.5 F (36.9 C), temperature source Oral, resp. rate 19, height 5\' 10"  (1.778 m), weight 68.9 kg (152 lb), SpO2 97 %.Body mass index is 21.81 kg/m.  General Appearance: Casual  Eye Contact:  Minimal  Speech:  Slow  Volume:  Decreased  Mood:  Anxious and Depressed  Affect:  flat  Thought Process:  Coherent  Orientation:  Full (Time, Place, and Person)  Thought Content:  Logical  Suicidal Thoughts:  No, denies  Homicidal Thoughts:  No  Memory:  Immediate;   Fair Recent;   Fair Remote;   Fair  Judgement:  Fair  Insight:  Fair  Psychomotor Activity:  Decreased  Concentration:  Concentration: Fair  Recall:  [employer] of Knowledge:  Fair  Language:  Fair  Akathisia:  No  Handed:  Right  AIMS (if indicated):     Assets:  Desire for Improvement Financial Resources/Insurance Housing Social Support  ADL's:  Impaired  Cognition:  WNL  Sleep:  Number of Hours: 5.75        Treatment Plan Summary: Daily contact with patient to assess and evaluate symptoms and progress in treatment and Medication management. Plan-  Pt with depression and catatonia. Pt still depressed,  Cont Remeron, will have 2nd ECT on Monday, cont 1:1 .  Group/milieu tx.     [name], MD 01/14/2018, 5:35 PM

## 2018-01-15 ENCOUNTER — Ambulatory Visit
Admission: RE | Admit: 2018-01-15 | Discharge: 2018-01-15 | Disposition: A | Discharge status: Home / Self Care | Payer: BLUE CROSS/BLUE SHIELD | Source: Intra-hospital | Attending: Psychiatry | Admitting: Psychiatry

## 2018-01-15 ENCOUNTER — Inpatient Hospital Stay: Payer: BLUE CROSS/BLUE SHIELD | Admitting: Anesthesiology

## 2018-01-15 ENCOUNTER — Other Ambulatory Visit: Payer: Self-pay | Admitting: Psychiatry

## 2018-01-15 MED ORDER — MIRTAZAPINE 15 MG PO TABS
30.0000 mg | ORAL_TABLET | Freq: Every day | ORAL | Status: DC
  Administered 2018-01-15 – 2018-01-17 (×3): 30 mg via ORAL
  Filled 2018-01-15 (×3): qty 2

## 2018-01-15 MED ORDER — SUCCINYLCHOLINE CHLORIDE 20 MG/ML IJ SOLN
INTRAMUSCULAR | Status: AC
  Filled 2018-01-15: qty 1

## 2018-01-15 MED ORDER — SUCCINYLCHOLINE CHLORIDE 200 MG/10ML IV SOSY
PREFILLED_SYRINGE | INTRAVENOUS | Status: DC | PRN
  Administered 2018-01-15: 80 mg via INTRAVENOUS

## 2018-01-15 MED ORDER — METHOHEXITAL SODIUM 100 MG/10ML IV SOSY
PREFILLED_SYRINGE | INTRAVENOUS | Status: DC | PRN
  Administered 2018-01-15: 70 mg via INTRAVENOUS

## 2018-01-15 MED ORDER — SODIUM CHLORIDE 0.9 % IV SOLN
INTRAVENOUS | Status: DC | PRN
  Administered 2018-01-15: 10:00:00 via INTRAVENOUS

## 2018-01-15 MED ORDER — SODIUM CHLORIDE 0.9 % IV SOLN
500.0000 mL | Freq: Once | INTRAVENOUS | Status: AC
  Administered 2018-01-15: 500 mL via INTRAVENOUS

## 2018-01-15 NOTE — Plan of Care (Signed)
Patients affect remains sad and depressed. Requires 2 assist when ambulating.

## 2018-01-15 NOTE — Progress Notes (Signed)
0800: Patient lying in bed with eyes open, denies pain, SI/HI/AVH at this time. Sitter at bedside 0900: Patient at ECT at this time. Sitter accompanied patient to procedure. 1000: Patient remains off of the unit for ECT 1100: Patient remains off of the unit for ECT. 1200: Arrived back to the unit from ECT, A&O x 4. Sitter at patients side. 1300: Patient in the dayroom eating his lunch with his sitter at his side. 1400: Patient outdoors for group therapy. Sitter at side. 1500: Patient in his room sitting in his wheelchair, sitter at his side. 1600: Sitter lying in bed with his eyes closed. Sitter at bedside. 1700: Patient sitting up in bed eating his dinner, stated that he didn't feel up to walking down to the dining hall. Sitter at bedside. 1800:Patient sitting up in his bed resting with his eyes closed, sitter at his bedside. No distress noted. 1900: Patient observed using his urinal in his room with the assistance of his sitter. No distress noted.

## 2018-01-15 NOTE — Progress Notes (Signed)
[facility] MD Progress Note  01/15/2018 8:43 PM [name]  MRN:  [id number] Subjective: Follow-up 62 year old man receiving ECT.  Patient had a second ECT treatment which occurred without any difficulty or complication.  Continues to be very weak and withdrawn and somewhat negative. Principal Problem: Severe recurrent major depression without psychotic features ([place]) Diagnosis:   Patient Active Problem List   Diagnosis Date Noted  . Catatonia [F06.1] 01/12/2018  . Malnutrition of moderate degree [E44.0] 01/09/2018  . Pressure injury of skin [L89.90] 01/09/2018  . Severe recurrent major depression without psychotic features ([place]) [F33.2] 01/09/2018  . Back pain [M54.9] 01/08/2018  . Shuffling gait [R26.89] 01/08/2018  . Depression [F32.9] 01/08/2018   Total Time spent with patient: 30 minutes  Past Psychiatric History: Patient has a long-standing history of depression without much improvement over the years.  No history of suicide attempts  Past Medical History:  Past Medical History:  Diagnosis Date  . Depression     Past Surgical History:  Procedure Laterality Date  . HERNIA REPAIR     Family History: History reviewed. No pertinent family history. Family Psychiatric  History: None Social History:  Social History   Substance and Sexual Activity  Alcohol Use Not on file     Social History   Substance and Sexual Activity  Drug Use Not Currently    Social History   Socioeconomic History  . Marital status: Married    Spouse name: Not on file  . Number of children: Not on file  . Years of education: Not on file  . Highest education level: Not on file  Occupational History  . Not on file  Social Needs  . Financial resource strain: Not on file  . Food insecurity:    Worry: Not on file    Inability: Not on file  . Transportation needs:    Medical: Not on file    Non-medical: Not on file  Tobacco Use  . Smoking status: Former Research scientist (life sciences)  . Smokeless tobacco: Former Systems developer     Quit date: 01/10/2017  Substance and Sexual Activity  . Alcohol use: Not on file  . Drug use: Not Currently  . Sexual activity: Not Currently  Lifestyle  . Physical activity:    Days per week: Not on file    Minutes per session: Not on file  . Stress: Not on file  Relationships  . Social connections:    Talks on phone: Not on file    Gets together: Not on file    Attends religious service: Not on file    Active member of club or organization: Not on file    Attends meetings of clubs or organizations: Not on file    Relationship status: Not on file  Other Topics Concern  . Not on file  Social History Narrative  . Not on file   Additional Social History:                         Sleep: Fair  Appetite:  Poor  Current Medications: Current Facility-Administered Medications  Medication Dose Route Frequency Provider Last Rate Last Dose  . acetaminophen (TYLENOL) tablet 650 mg  650 mg Oral Q6H PRN Keenan Dimitrov, Madie Reno, MD   650 mg at 01/14/18 2303  . alum & mag hydroxide-simeth (MAALOX/MYLANTA) 200-200-20 MG/5ML suspension 30 mL  30 mL Oral Q4H PRN Alon Mazor T, MD      . feeding supplement (ENSURE ENLIVE) (ENSURE ENLIVE) liquid 237  mL  237 mL Oral BID BM Haroldine Redler T, MD   237 mL at 01/15/18 1434  . ibuprofen (ADVIL,MOTRIN) tablet 400 mg  400 mg Oral Q6H PRN Bow Buntyn, Madie Reno, MD   400 mg at 01/12/18 2222  . magnesium hydroxide (MILK OF MAGNESIA) suspension 30 mL  30 mL Oral Daily PRN Amilya Haver T, MD      . mirtazapine (REMERON) tablet 15 mg  15 mg Oral QHS Darenda Fike, Madie Reno, MD   15 mg at 01/14/18 2226  . multivitamin with minerals tablet 1 tablet  1 tablet Oral Daily Maysin Carstens, Madie Reno, MD   1 tablet at 01/14/18 [phone]  . pantoprazole (PROTONIX) EC tablet 40 mg  40 mg Oral Daily Raquelle Pietro, Madie Reno, MD   40 mg at 01/15/18 0751  . traZODone (DESYREL) tablet 100 mg  100 mg Oral QHS PRN Darleene Cumpian, Madie Reno, MD   100 mg at 01/12/18 2216    Lab Results: No results found for this or  any previous visit (from the past 48 hour(s)).  Blood Alcohol level:  Lab Results  Component Value Date   ETH <10 27/51/7001    Metabolic Disorder Labs: No results found for: HGBA1C, MPG No results found for: PROLACTIN No results found for: CHOL, TRIG, HDL, CHOLHDL, VLDL, LDLCALC  Physical Findings: AIMS:  , ,  ,  ,    CIWA:    COWS:     Musculoskeletal: Strength & Muscle Tone: decreased Gait & Station: unable to stand Patient leans: N/A  Psychiatric Specialty Exam: Physical Exam  Nursing note and vitals reviewed. Constitutional: He appears well-developed.  HENT:  Head: Normocephalic and atraumatic.  Eyes: Pupils are equal, round, and reactive to light. Conjunctivae are normal.  Neck: Normal range of motion.  Cardiovascular: Normal heart sounds.  Respiratory: Effort normal.  GI: Soft.  Musculoskeletal: Normal range of motion.  Neurological: He is alert.  Skin: Skin is warm and dry.  Psychiatric: Judgment normal. His affect is blunt. His speech is delayed. He is slowed and withdrawn. Cognition and memory are impaired. He expresses no suicidal ideation.    Review of Systems  Constitutional: Negative.   HENT: Negative.   Eyes: Negative.   Respiratory: Negative.   Cardiovascular: Negative.   Gastrointestinal: Negative.   Musculoskeletal: Negative.   Skin: Negative.   Neurological: Positive for weakness.  Psychiatric/Behavioral: Positive for depression. Negative for hallucinations, memory loss, substance abuse and suicidal ideas. The patient is nervous/anxious. The patient does not have insomnia.     Blood pressure 113/64, pulse 89, temperature 98 F (36.7 C), temperature source Oral, resp. rate 16, height 5\' 10"  (1.778 m), weight 68.9 kg (152 lb), SpO2 97 %.Body mass index is 21.81 kg/m.  General Appearance: Casual  Eye Contact:  Minimal  Speech:  Slow  Volume:  Decreased  Mood:  Dysphoric  Affect:  Congruent  Thought Process:  Coherent  Orientation:  Full  (Time, Place, and Person)  Thought Content:  Logical  Suicidal Thoughts:  No  Homicidal Thoughts:  No  Memory:  Immediate;   Fair Recent;   Fair Remote;   Fair  Judgement:  Fair  Insight:  Fair  Psychomotor Activity:  Decreased  Concentration:  Concentration: Fair  Recall:  [employer] of Knowledge:  Fair  Language:  Fair  Akathisia:  No  Handed:  Right  AIMS (if indicated):     Assets:  Desire for Improvement  ADL's:  Impaired  Cognition:  Impaired,  Moderate  Sleep:  Number of Hours: 5.75     Treatment Plan Summary: Daily contact with patient to assess and evaluate symptoms and progress in treatment, Medication management and Plan Patient with severe depression has now had 2 ECT treatments.  Tolerated without difficulty.  Hard to see how much improvement there is been in his mood but it is certainly not worse.  Does not show signs of obvious worsening of his cognitive state.  Plan for the ECT is to continue as scheduled.  May add more antidepressant at night as well to try and help with appetite and sleep and mood.  Strongly encourage patient to continue being optimistic and to make sure he is eating as much as possible and try to work with physical therapy on improving his strength.  Alethia Berthold, MD 01/15/2018, 8:43 PM

## 2018-01-15 NOTE — Anesthesia Procedure Notes (Signed)
Date/Time: 01/15/2018 10:36 AM Performed by: Dionne Bucy, CRNA Pre-anesthesia Checklist: Patient identified, Emergency Drugs available, Suction available and Patient being monitored Patient Re-evaluated:Patient Re-evaluated prior to induction Oxygen Delivery Method: Circle system utilized Preoxygenation: Pre-oxygenation with 100% oxygen Induction Type: IV induction Ventilation: Mask ventilation without difficulty and Mask ventilation throughout procedure Airway Equipment and Method: Bite block Placement Confirmation: positive ETCO2 Dental Injury: Teeth and Oropharynx as per pre-operative assessment

## 2018-01-15 NOTE — Transfer of Care (Signed)
Immediate Anesthesia Transfer of Care Note  Patient: [name]  Procedure(s) Performed: ECT TX  Patient Location: PACU  Anesthesia Type:General  Level of Consciousness: sedated  Airway & Oxygen Therapy: Patient Spontanous Breathing and Patient connected to face mask oxygen  Post-op Assessment: Report given to RN and Post -op Vital signs reviewed and stable  Post vital signs: Reviewed and stable  Last Vitals:  Vitals Value Taken Time  BP 182/104 01/15/2018 10:45 AM  Temp    Pulse 55 01/15/2018 10:46 AM  Resp 10 01/15/2018 10:46 AM  SpO2 100 % 01/15/2018 10:46 AM  Vitals shown include unvalidated device data.  Last Pain:  Vitals:   01/15/18 0936  TempSrc: Oral  PainSc: 5          Complications: No apparent anesthesia complications

## 2018-01-15 NOTE — Progress Notes (Signed)
Recreation Therapy Notes  INPATIENT RECREATION THERAPY ASSESSMENT  Patient Details Name: [name] MRN: [id number] DOB: [birth year] Today's Date: 01/15/2018       Information Obtained From: Patient  Able to Participate in Assessment/Interview: Yes  Patient Presentation: Responsive  Reason for Admission (Per Patient): Other (Comments)(I am not sure, I could not move)  Patient Stressors:    Coping Skills:   Sports  Leisure Interests (2+):  Individual - TV, Social - Family, Sports - Football  Frequency of Recreation/Participation: Biomedical engineer of Community Resources:  Yes  Community Resources:     Current Use: No  If no, Barriers?: Transportation  Expressed Interest in [place]: Yes  [employer] of Residence:  Insurance underwriter  Patient Main Form of Transportation: Other (Comment)(Grandson takes me places)  Patient Strengths:  Nice  Patient Identified Areas of Improvement:  Get out of the house more  Patient Goal for Hospitalization:  I am unsure  Current SI (including self-harm):  No(I do not want to die but I would not mind being with the [place]. I want to be here for my grandson and wife.)  Current HI:  No  Current AVH: No  Staff Intervention Plan: Group Attendance, Collaborate with Interdisciplinary Treatment Team  Consent to Intern Participation: N/A  Lynzi Meulemans 01/15/2018, 2:44 PM

## 2018-01-15 NOTE — Anesthesia Postprocedure Evaluation (Signed)
Anesthesia Post Note  Patient: Ronzell Laban Laske  Procedure(s) Performed: ECT TX  Patient location during evaluation: PACU Anesthesia Type: General Level of consciousness: awake and alert Pain management: pain level controlled Vital Signs Assessment: post-procedure vital signs reviewed and stable Respiratory status: spontaneous breathing, nonlabored ventilation, respiratory function stable and patient connected to nasal cannula oxygen Cardiovascular status: blood pressure returned to baseline and stable Postop Assessment: no apparent nausea or vomiting Anesthetic complications: no     Last Vitals:  Vitals:   01/15/18 1110 01/15/18 1130  BP: (!) 168/80 (!) 156/83  Pulse: 82 77  Resp: 11 13  Temp:    SpO2: 98% 99%    Last Pain:  Vitals:   01/15/18 1130  TempSrc:   PainSc: 0-No pain                 Martha Clan

## 2018-01-15 NOTE — Anesthesia Preprocedure Evaluation (Signed)
Anesthesia Evaluation  Patient identified by MRN, date of birth, ID band Patient awake    Reviewed: Allergy & Precautions, NPO status , Patient's Chart, lab work & pertinent test results  History of Anesthesia Complications Negative for: history of anesthetic complications  Airway Mallampati: II       Dental  (+) Dental Advidsory Given   Pulmonary neg sleep apnea, neg COPD, former smoker,           Cardiovascular (-) hypertension(-) Past MI and (-) CHF (-) dysrhythmias (-) Valvular Problems/Murmurs     Neuro/Psych neg Seizures Depression    GI/Hepatic Neg liver ROS, neg GERD  ,  Endo/Other  neg diabetes  Renal/GU negative Renal ROS     Musculoskeletal   Abdominal   Peds  Hematology   Anesthesia Other Findings Past Medical History: No date: Depression   Reproductive/Obstetrics                             Anesthesia Physical  Anesthesia Plan  ASA: II  Anesthesia Plan: General   Post-op Pain Management:    Induction: Intravenous  PONV Risk Score and Plan: 2 and TIVA  Airway Management Planned: Mask  Additional Equipment:   Intra-op Plan:   Post-operative Plan:   Informed Consent: I have reviewed the patients History and Physical, chart, labs and discussed the procedure including the risks, benefits and alternatives for the proposed anesthesia with the patient or authorized representative who has indicated his/her understanding and acceptance.     Plan Discussed with:   Anesthesia Plan Comments:         Anesthesia Quick Evaluation

## 2018-01-15 NOTE — [facility] Suicide Risk Assessment (Signed)
[name] INPATIENT:  Family/Significant Other Suicide Prevention Education  Suicide Prevention Education:  Education Completed; Joylene Igo, [phone],  (name of family member/significant other) has been identified by the patient as the family member/significant other with whom the patient will be residing, and identified as the person(s) who will aid the patient in the event of a mental health crisis (suicidal ideations/suicide attempt).  With written consent from the patient, the family member/significant other has been provided the following suicide prevention education, prior to the and/or following the discharge of the patient.  The suicide prevention education provided includes the following:  Suicide risk factors  Suicide prevention and interventions  National Suicide Hotline telephone number  [facility] assessment telephone number  [facility] Emergency Assistance [place] and/or Residential Mobile Crisis Unit telephone number  Request made of family/significant other to:  Remove weapons (e.g., guns, rifles, knives), all items previously/currently identified as safety concern.    Remove drugs/medications (over-the-counter, prescriptions, illicit drugs), all items previously/currently identified as a safety concern.  The family member/significant other verbalizes understanding of the suicide prevention education information provided.  The family member/significant other agrees to remove the items of safety concern listed above.  Carloyn Jaeger Adriauna Campton, LCSW 01/15/2018, 11:37 AM

## 2018-01-15 NOTE — Progress Notes (Signed)
Recreation Therapy Notes  Date: 01/15/2018  Time: 9:30 am   Location: Craft Room   Behavioral response: N/A   Intervention Topic:  Problem Solving  Discussion/Intervention: Patient did not attend group.   Clinical Observations/Feedback:  Patient did not attend group.   Amandalynn Pitz LRT/CTRS        [place] Chaviano 01/15/2018 10:22 AM

## 2018-01-15 NOTE — Progress Notes (Signed)
Patient continues to be on 1:1 no distress noted, on camera monitoring, affect is bright upon approach, denies SI/HI/AVH, 15 minutes safety ntained maintained will continue to [employer]

## 2018-01-15 NOTE — Tx Team (Addendum)
Interdisciplinary Treatment and Diagnostic Plan Update  01/15/2018 Time of Session: reviewed by phone with Physician Kolbie Lepkowski Dall MRN: [id number]  Principal Diagnosis: Severe recurrent major depression without psychotic features ([place])  Secondary Diagnoses: Principal Problem:   Severe recurrent major depression without psychotic features ([place]) Active Problems:   Catatonia   Current Medications:  Current Facility-Administered Medications  Medication Dose Route Frequency Provider Last Rate Last Dose  . acetaminophen (TYLENOL) tablet 650 mg  650 mg Oral Q6H PRN Clapacs, Madie Reno, MD   650 mg at 01/14/18 2303  . alum & mag hydroxide-simeth (MAALOX/MYLANTA) 200-200-20 MG/5ML suspension 30 mL  30 mL Oral Q4H PRN Clapacs, John T, MD      . feeding supplement (ENSURE ENLIVE) (ENSURE ENLIVE) liquid 237 mL  237 mL Oral BID BM Clapacs, John T, MD   237 mL at 01/15/18 1434  . ibuprofen (ADVIL,MOTRIN) tablet 400 mg  400 mg Oral Q6H PRN Clapacs, Madie Reno, MD   400 mg at 01/12/18 2222  . magnesium hydroxide (MILK OF MAGNESIA) suspension 30 mL  30 mL Oral Daily PRN Clapacs, John T, MD      . mirtazapine (REMERON) tablet 15 mg  15 mg Oral QHS Clapacs, Madie Reno, MD   15 mg at 01/14/18 2226  . multivitamin with minerals tablet 1 tablet  1 tablet Oral Daily Clapacs, Madie Reno, MD   1 tablet at 01/14/18 [phone]  . pantoprazole (PROTONIX) EC tablet 40 mg  40 mg Oral Daily Clapacs, Madie Reno, MD   40 mg at 01/15/18 0751  . traZODone (DESYREL) tablet 100 mg  100 mg Oral QHS PRN Clapacs, Madie Reno, MD   100 mg at 01/12/18 2216   PTA Medications: Medications Prior to Admission  Medication Sig Dispense Refill Last Dose  . feeding supplement, ENSURE ENLIVE, (ENSURE ENLIVE) LIQD Take 237 mLs by mouth 2 (two) times daily between meals. 237 mL 12 01/14/2018  . ibuprofen (ADVIL,MOTRIN) 600 MG tablet Take 1 tablet (600 mg total) by mouth every 6 (six) hours as needed for moderate pain. 30 tablet 0 01/14/2018  . Multiple Vitamin  (MULTIVITAMIN WITH MINERALS) TABS tablet Take 1 tablet by mouth daily. 30 tablet 0 01/14/2018  . predniSONE (STERAPRED UNI-PAK 21 TAB) 10 MG (21) TBPK tablet Taper by 10 mg daily 21 tablet 0 01/14/2018  . vitamin B-12 1000 MCG tablet Take 1 tablet (1,000 mcg total) by mouth daily. 30 tablet 0 01/14/2018  . [START ON 01/17/2018] Vitamin D, Ergocalciferol, (DRISDOL) 50000 units CAPS capsule Take 1 capsule (50,000 Units total) by mouth every 7 (seven) days. 5 capsule 0 01/14/2018    Patient Stressors:    Patient Strengths:    Treatment Modalities: Medication Management, Group therapy, Case management,  1 to 1 session with clinician, Psychoeducation, Recreational therapy.   Physician Treatment Plan for Primary Diagnosis: Severe recurrent major depression without psychotic features ([place]) Long Term Goal(s): Improvement in symptoms so as ready for discharge Improvement in symptoms so as ready for discharge   Short Term Goals: Ability to demonstrate self-control will improve Ability to identify and develop effective coping behaviors will improve Ability to maintain clinical measurements within normal limits will improve Compliance with prescribed medications will improve  Medication Management: Evaluate patient's response, side effects, and tolerance of medication regimen.  Therapeutic Interventions: 1 to 1 sessions, Unit Group sessions and Medication administration.  Evaluation of Outcomes: Progressing  Physician Treatment Plan for Secondary Diagnosis: Principal Problem:   Severe recurrent major depression without psychotic features (  [place]) Active Problems:   Catatonia  Long Term Goal(s): Improvement in symptoms so as ready for discharge Improvement in symptoms so as ready for discharge   Short Term Goals: Ability to demonstrate self-control will improve Ability to identify and develop effective coping behaviors will improve Ability to maintain clinical measurements within normal limits will  improve Compliance with prescribed medications will improve     Medication Management: Evaluate patient's response, side effects, and tolerance of medication regimen.  Therapeutic Interventions: 1 to 1 sessions, Unit Group sessions and Medication administration.  Evaluation of Outcomes: Progressing   RN Treatment Plan for Primary Diagnosis: Severe recurrent major depression without psychotic features ([place]) Long Term Goal(s): Knowledge of disease and therapeutic regimen to maintain health will improve  Short Term Goals: Ability to identify and develop effective coping behaviors will improve and Compliance with prescribed medications will improve  Medication Management: RN will administer medications as ordered by provider, will assess and evaluate patient's response and provide education to patient for prescribed medication. RN will report any adverse and/or side effects to prescribing provider.  Therapeutic Interventions: 1 on 1 counseling sessions, Psychoeducation, Medication administration, Evaluate responses to treatment, Monitor vital signs and CBGs as ordered, Perform/monitor CIWA, COWS, AIMS and Fall Risk screenings as ordered, Perform wound care treatments as ordered.  Evaluation of Outcomes: Progressing   LCSW Treatment Plan for Primary Diagnosis: Severe recurrent major depression without psychotic features ([place]) Long Term Goal(s): Safe transition to appropriate next level of care at discharge, Engage patient in therapeutic group addressing interpersonal concerns.  Short Term Goals: Engage patient in aftercare planning with referrals and resources, Identify triggers associated with mental health/substance abuse issues and Increase skills for wellness and recovery  Therapeutic Interventions: Assess for all discharge needs, 1 to 1 time with Social worker, Explore available resources and support systems, Assess for adequacy in community support network, Educate family and significant  other(s) on suicide prevention, Complete Psychosocial Assessment, Interpersonal group therapy.  Evaluation of Outcomes: Progressing   Progress in Treatment: Attending groups: No. Participating in groups: No. Taking medication as prescribed: Yes. Toleration medication: Yes. Family/Significant other contact made: Yes, individual(s) contacted:  grandson [place] Patient understands diagnosis: Yes. Discussing patient identified problems/goals with staff: Yes. Medical problems stabilized or resolved: Yes. Denies suicidal/homicidal ideation: No. Issues/concerns per patient self-inventory: No. Other:    New problem(s) identified: No, Describe:     New Short Term/Long Term Goal(s):  Patient Goals:  To feel better, have more energy to take car of himself more, be more active again  Discharge Plan or Barriers: ECT treatment in hosptial, possibility of outpatient ECT follow up TBD.  Discharge home to family with outpatient follow up medication managementTBD   Reason for Continuation of Hospitalization: Anxiety Depression Medication stabilization Suicidal ideation  Estimated Length of Stay: 7-10 days  Recreational Therapy: Patient Stressors: N/A Patient Goal: Patient will engage in groups without prompting or encouragement from LRT x3 group sessions within 5 recreation therapy group sessions  Attendees: Patient:[name] 01/15/2018 4:51 PM  Physician: Dr. Weber Cooks 01/15/2018 4:51 PM  Nursing:  01/15/2018 4:51 PM  RN Care Manager: 01/15/2018 4:51 PM  Social Worker: Dossie Arbour, LCSW 01/15/2018 4:51 PM  Recreational Therapist:  01/15/2018 4:51 PM  Other:  01/15/2018 4:51 PM  Other:  01/15/2018 4:51 PM  Other: 01/15/2018 4:51 PM    Scribe for Treatment Team: August Saucer, LCSW 01/15/2018 4:51 PM

## 2018-01-15 NOTE — Progress Notes (Signed)
Patient returned to unit after having ECT procedure done. Patient alert and oriented x 4. Ambulatory with a steady gait. Denies pain at this time. Food and po fluids provided to patient. Ate meal without difficulty, after meal pt went outside  for group then observed resting in bed with eyes closed. Will continue to monitor.

## 2018-01-15 NOTE — Anesthesia Post-op Follow-up Note (Signed)
Anesthesia QCDR form completed.        
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
## 2018-01-16 ENCOUNTER — Other Ambulatory Visit: Payer: Self-pay | Admitting: Psychiatry

## 2018-01-16 NOTE — Progress Notes (Signed)
Patient sitter called Probation officer in the room and explained to me that patient has a opening on his buttocks, upon inspection his buttocks appears red with an opened area in the left side of her buttocks. Patient was encouraged to shift weight, barrier cream applied also would encourage staff to turn and reposition every two hours.

## 2018-01-16 NOTE — Progress Notes (Signed)
Patient sitting in the wheelchair.Sitter with patient.

## 2018-01-16 NOTE — BHH Group Notes (Signed)
01/16/2018 1PM  Type of Therapy/Topic:  Group Therapy:  Feelings about Diagnosis  Participation Level:  Active   Description of Group:   This group will allow patients to explore their thoughts and feelings about diagnoses they have received. Patients will be guided to explore their level of understanding and acceptance of these diagnoses. Facilitator will encourage patients to process their thoughts and feelings about the reactions of others to their diagnosis and will guide patients in identifying ways to discuss their diagnosis with significant others in their lives. This group will be process-oriented, with patients participating in exploration of their own experiences, giving and receiving support, and processing challenge from other group members.   Therapeutic Goals: 1. Patient will demonstrate understanding of diagnosis as evidenced by identifying two or more symptoms of the disorder 2. Patient will be able to express two feelings regarding the diagnosis 3. Patient will demonstrate their ability to communicate their needs through discussion and/or role play  Summary of Patient Progress: Actively and appropriately engaged in the group. Patient practiced active listening when interacting with the facilitator and other group members. Tauno reports having a symptom of paranoia. He reports "It isn't about anything in particular." The patient stayed the entire group. Patient is still in the process of obtaining treatment goals.        Therapeutic Modalities:   Cognitive Behavioral Therapy Brief Therapy Feelings Identification    Darin Engels, LCSW 01/16/2018 2:58 PM

## 2018-01-16 NOTE — Progress Notes (Signed)
Patient worked well with PT.Ambulated in the hallway.Pleasant and cooperative on approach.Denies SI,HI and AVH.Safety sitter at bedside.

## 2018-01-16 NOTE — Plan of Care (Signed)
  Problem: Activity: Goal: Sleeping patterns will improve Outcome: Progressing   Problem: Activity: Goal: Sleeping patterns will improve Outcome: Progressing   Problem: Coping: Goal: Ability to verbalize frustrations and anger appropriately will improve Outcome: Progressing  Patient verbalized frustration to staff and rated  his depression a 5/10   ( 0-10)

## 2018-01-16 NOTE — Progress Notes (Signed)
Received [name] this AM sitting in the dinning room after his breakfast. He is talking in a low voice stating he feels weak and not doing well. He endorsed feeling depressed this AM. He was compliant with his medications and requested MOM for mild constipation. He continues to have a CNA, one to one at the bedside. He has a spotted red area to his left buttocks and between his legs. A wound consult was submitted by Dr. Weber Cooks.    0700 OOB in the dinning roon 0800 OOB in the dinning room 0900 OOB sitting in his room in the wheelchair 1000 OOB sitting in the wheelchair in his room 1100 OOB sitting in his wheelchair in his room 1200 OOB in the dining room [place] in the dinning room 1400  OOB in his room 1500 Outside in the wheelchair with sitter 1600 Out side in the wheelchair 1700   OOB in the dining room  1800  In his bed in his room 1900   In his bed in the room

## 2018-01-16 NOTE — Progress Notes (Signed)
Patient is alert and oriented x 2-3 , resting in bed with 1:1 sitter at bedside no distress noted, affect is flat but brightens upon approach, thoughts are less disorganized, speech is soft non tangential. Patient is S/P ECT no distress noted, ROME x 4, gait unsteady, he requires a two person assist for transfer to the bathroom and bed. 15 mijnutes safety rounds maintained will continue to monitor.

## 2018-01-16 NOTE — BHH Group Notes (Signed)
CSW Group Therapy Note  01/16/2018  Time:  0900  Type of Therapy and Topic: Group Therapy: Goals Group: SMART Goals    Participation Level:  Did Not Attend    Description of Group:   The purpose of a daily goals group is to assist and guide patients in setting recovery/wellness-related goals. The objective is to set goals as they relate to the crisis in which they were admitted. Patients will be using SMART goal modalities to set measurable goals. Characteristics of realistic goals will be discussed and patients will be assisted in setting and processing how one will reach their goal. Facilitator will also assist patients in applying interventions and coping skills learned in psycho-education groups to the SMART goal and process how one will achieve defined goal.    Therapeutic Goals:  -Patients will develop and document one goal related to or their crisis in which brought them into treatment.  -Patients will be guided by LCSW using SMART goal setting modality in how to set a measurable, attainable, realistic and time sensitive goal.  -Patients will process barriers in reaching goal.  -Patients will process interventions in how to overcome and successful in reaching goal.    Patient's Goal:  Pt was invited to attend group but chose not to attend. CSW will continue to encourage pt to attend group throughout their admission.    Therapeutic Modalities:  Motivational Interviewing  Cognitive Behavioral Therapy  Crisis Intervention Model  SMART goals setting  Alden Hipp, MSW, LCSW Clinical Social Worker 01/16/2018 10:06 AM

## 2018-01-16 NOTE — Progress Notes (Signed)
Patient ambulating with walker in the hallway.Sitter with patient.

## 2018-01-16 NOTE — Progress Notes (Signed)
Recreation Therapy Notes  Date: 01/16/2018  Time: 9:30 am   Location: Craft Room   Behavioral response: N/A   Intervention Topic:  Self-Esteem  Discussion/Intervention: Patient did not attend group.   Clinical Observations/Feedback:  Patient did not attend group.   Fayez Sturgell LRT/CTRS       Jasha Hodzic 01/16/2018 10:37 AM

## 2018-01-16 NOTE — BHH Group Notes (Signed)
[place] Group Notes:  (Nursing/MHT/Case Management/Adjunct)  Date:  01/16/2018  Time:  10:00 PM  Type of Therapy:  Group Therapy  Participation Level:  Did Not Attend    Barnie Mort 01/16/2018, 10:00 PM

## 2018-01-16 NOTE — Progress Notes (Signed)
[facility] MD Progress Note  01/16/2018 6:21 PM [name]  MRN:  [id number] Subjective: Follow-up 62 year old man with major depression.  This evening he was up out of bed and was eating independently.  He had been able to ambulate with the use of a walker.  Patient says he is feeling a little bit better.  Affect still very blunted and withdrawn but he is eating better. Principal Problem: Severe recurrent major depression without psychotic features ([place]) Diagnosis:   Patient Active Problem List   Diagnosis Date Noted  . Catatonia [F06.1] 01/12/2018  . Malnutrition of moderate degree [E44.0] 01/09/2018  . Pressure injury of skin [L89.90] 01/09/2018  . Severe recurrent major depression without psychotic features ([place]) [F33.2] 01/09/2018  . Back pain [M54.9] 01/08/2018  . Shuffling gait [R26.89] 01/08/2018  . Depression [F32.9] 01/08/2018   Total Time spent with patient: 30 minutes  Past Psychiatric History: Long-standing depression  Past Medical History:  Past Medical History:  Diagnosis Date  . Depression     Past Surgical History:  Procedure Laterality Date  . HERNIA REPAIR     Family History: History reviewed. No pertinent family history. Family Psychiatric  History: None Social History:  Social History   Substance and Sexual Activity  Alcohol Use Not on file     Social History   Substance and Sexual Activity  Drug Use Not Currently    Social History   Socioeconomic History  . Marital status: Married    Spouse name: Not on file  . Number of children: Not on file  . Years of education: Not on file  . Highest education level: Not on file  Occupational History  . Not on file  Social Needs  . Financial resource strain: Not on file  . Food insecurity:    Worry: Not on file    Inability: Not on file  . Transportation needs:    Medical: Not on file    Non-medical: Not on file  Tobacco Use  . Smoking status: Former Research scientist (life sciences)  . Smokeless tobacco: Former Systems developer    Quit  date: 01/10/2017  Substance and Sexual Activity  . Alcohol use: Not on file  . Drug use: Not Currently  . Sexual activity: Not Currently  Lifestyle  . Physical activity:    Days per week: Not on file    Minutes per session: Not on file  . Stress: Not on file  Relationships  . Social connections:    Talks on phone: Not on file    Gets together: Not on file    Attends religious service: Not on file    Active member of club or organization: Not on file    Attends meetings of clubs or organizations: Not on file    Relationship status: Not on file  Other Topics Concern  . Not on file  Social History Narrative  . Not on file   Additional Social History:                         Sleep: Fair  Appetite:  Fair  Current Medications: Current Facility-Administered Medications  Medication Dose Route Frequency Provider Last Rate Last Dose  . acetaminophen (TYLENOL) tablet 650 mg  650 mg Oral Q6H PRN Shermon Bozzi, Madie Reno, MD   650 mg at 01/14/18 2303  . alum & mag hydroxide-simeth (MAALOX/MYLANTA) 200-200-20 MG/5ML suspension 30 mL  30 mL Oral Q4H PRN Ahmaud Duthie, Madie Reno, MD      . feeding  supplement (ENSURE ENLIVE) (ENSURE ENLIVE) liquid 237 mL  237 mL Oral BID BM Floyd Lusignan T, MD   237 mL at 01/16/18 1439  . ibuprofen (ADVIL,MOTRIN) tablet 400 mg  400 mg Oral Q6H PRN Keyera Hattabaugh, Madie Reno, MD   400 mg at 01/12/18 2222  . magnesium hydroxide (MILK OF MAGNESIA) suspension 30 mL  30 mL Oral Daily PRN Michaeljoseph Revolorio, Madie Reno, MD   30 mL at 01/16/18 0852  . mirtazapine (REMERON) tablet 30 mg  30 mg Oral QHS Nocholas Damaso, Madie Reno, MD   30 mg at 01/15/18 2154  . multivitamin with minerals tablet 1 tablet  1 tablet Oral Daily Dyana Magner, Madie Reno, MD   1 tablet at 01/16/18 0845  . pantoprazole (PROTONIX) EC tablet 40 mg  40 mg Oral Daily Saidee Geremia, Madie Reno, MD   40 mg at 01/16/18 0845  . traZODone (DESYREL) tablet 100 mg  100 mg Oral QHS PRN Makana Feigel, Madie Reno, MD   100 mg at 01/15/18 2154    Lab Results: No results found  for this or any previous visit (from the past 48 hour(s)).  Blood Alcohol level:  Lab Results  Component Value Date   ETH <10 16/04/9603    Metabolic Disorder Labs: No results found for: HGBA1C, MPG No results found for: PROLACTIN No results found for: CHOL, TRIG, HDL, CHOLHDL, VLDL, LDLCALC  Physical Findings: AIMS:  , ,  ,  ,    CIWA:    COWS:     Musculoskeletal: Strength & Muscle Tone: decreased Gait & Station: unsteady Patient leans: N/A  Psychiatric Specialty Exam: Physical Exam  Nursing note and vitals reviewed. Constitutional: He appears well-developed.  HENT:  Head: Normocephalic and atraumatic.  Eyes: Pupils are equal, round, and reactive to light. Conjunctivae are normal.  Neck: Normal range of motion.  Cardiovascular: Regular rhythm and normal heart sounds.  Respiratory: Effort normal.  GI: Soft.  Musculoskeletal: Normal range of motion.  Neurological: He is alert.  Skin: Skin is warm and dry.  Psychiatric: Judgment normal. His affect is blunt. His speech is delayed. He is slowed. Cognition and memory are impaired. He expresses no homicidal and no suicidal ideation.    Review of Systems  Constitutional: Negative.   HENT: Negative.   Eyes: Negative.   Respiratory: Negative.   Cardiovascular: Negative.   Gastrointestinal: Negative.   Musculoskeletal: Negative.   Skin: Negative.   Neurological: Positive for weakness.  Psychiatric/Behavioral: Positive for depression. Negative for suicidal ideas.    Blood pressure (!) 106/58, pulse 78, temperature 98.5 F (36.9 C), temperature source Oral, resp. rate 18, height 5\' 10"  (1.778 m), weight 68.9 kg (152 lb), SpO2 98 %.Body mass index is 21.81 kg/m.  General Appearance: Fairly Groomed  Eye Contact:  Fair  Speech:  Clear and Coherent  Volume:  Decreased  Mood:  Depressed  Affect:  Blunt  Thought Process:  Goal Directed  Orientation:  Full (Time, Place, and Person)  Thought Content:  Logical  Suicidal  Thoughts:  No  Homicidal Thoughts:  No  Memory:  Immediate;   Fair Recent;   Fair Remote;   Fair  Judgement:  Fair  Insight:  Fair  Psychomotor Activity:  Decreased  Concentration:  Concentration: Fair  Recall:  [employer] of Knowledge:  Fair  Language:  Fair  Akathisia:  No  Handed:  Right  AIMS (if indicated):     Assets:  Desire for Improvement  ADL's:  Impaired  Cognition:  Impaired,  Mild  Sleep:  Number of Hours: 9     Treatment Plan Summary: Daily contact with patient to assess and evaluate symptoms and progress in treatment, Medication management and Plan No change to medicine today.  Tolerated increase in mirtazapine.  Next ECT treatment scheduled for tomorrow morning.  Patient may be showing some early signs of improvement.  Continue one-on-one for now but also continue physical therapy and encouragement that he walk.  Nursing noticed some possible signs of early skin breakdown on his buttocks this morning and we will order a wound consult.  Alethia Berthold, MD 01/16/2018, 6:21 PM

## 2018-01-16 NOTE — Progress Notes (Signed)
Physical Therapy Treatment Patient Details Name: [name] MRN: [id number] DOB: Oct 14, 1955 Today's Date: 01/16/2018    History of Present Illness 62 y/o male here with c/o severe low back pain.  Per grandson (who pt lives with) he has been unmotivated to do anything over the last several months and has hardly done much more than get out of bed over the last several weeks. Pt diagnosed with severe recurrent depression without psychiatric features. Pt recently transfered to behavior med. New PT eval completed on 01/12/18. Pt began ECT treatments 3x/week beginning 01/12/18    PT Comments    Patient is making excellent progress with therapy on this date. Pt received upright ambulating with staff. He had already completed a lap around RN station when therapist arrived. Pt is able to ambulate an additional 200' using a rolling walker. Forward flexed trunk with minimal ability to correct with cues secondary to chronic low back pain. No respiratory distress noted during ambulation. No external support for balance provided by therapist. He is able to complete all seated exercises as instructed by therapist. He would benefit from home health physical therapy at discharge to work on strength and balance, ultimately transitioning into a community-based exercise program. Pt will benefit from PT services to address deficits in strength, balance, and mobility in order to return to full function at home.   Follow Up Recommendations  Home health PT     Equipment Recommendations  Rolling walker with 5" wheels    Recommendations for Other Services       Precautions / Restrictions Precautions Precautions: Fall Restrictions Weight Bearing Restrictions: No    Mobility  Bed Mobility                  Transfers Overall transfer level: Needs assistance   Transfers: Sit to/from Stand Sit to Stand: Supervision         General transfer comment: Pt recieved upright walking with CNA. Cues provided  for safe hand placement during descend when sitting back into wheelchair. Pt is steady with good eccentric lowering back into wheelchair  Ambulation/Gait Ambulation/Gait assistance: Min guard Gait Distance (Feet): 200 Feet Assistive device: Rolling walker (2 wheeled);None   Gait velocity: Within functional limits for limited household mobility   General Gait Details: Pt received upright ambulating with staff. He had already completed a lap around RN station when therapist arrived. Pt is able to ambulate an additional 200' using a rolling walker. Forward flexed trunk with minimal ability to correct with cues secondary to chronic low back pain. No respiratory distress noted during ambulation. No external support for balance provided   Stairs             Wheelchair Mobility    Modified Rankin (Stroke Patients Only)       Balance Overall balance assessment: Needs assistance   Sitting balance-Leahy Scale: Fair Sitting balance - Comments: able to sit with CGA to min assist EOB with UE support for long period of time     Standing balance-Leahy Scale: Fair Standing balance comment: able to stand with B UE support on RW and min assit without gross LOB                            Cognition Arousal/Alertness: Awake/alert Behavior During Therapy: Flat affect Overall Cognitive Status: Difficult to assess  General Comments: Pt with low energy, flat affect, soft spoken, appearing depressed demeanor remained same throughout      Exercises General Exercises - Lower Extremity Long Arc Quad: Both;10 reps;Seated Heel Slides: Both;10 reps;Seated Hip ABduction/ADduction: Both;10 reps;Seated Hip Flexion/Marching: Both;10 reps;Seated    General Comments        Pertinent Vitals/Pain Pain Assessment: No/denies pain Pain Location: Reports chronic back pain but not currently    Home Living                      Prior  Function            PT Goals (current goals can now be found in the care plan section) Acute Rehab PT Goals Patient Stated Goal: unsure, family hoping he can do some behavioral/psych rehab PT Goal Formulation: With patient Time For Goal Achievement: 01/26/18 Potential to Achieve Goals: Fair Progress towards PT goals: Progressing toward goals    Frequency    Min 2X/week      PT Plan Current plan remains appropriate    Co-evaluation              AM-PAC PT "6 Clicks" Daily Activity  Outcome Measure  Difficulty turning over in bed (including adjusting bedclothes, sheets and blankets)?: Unable Difficulty moving from lying on back to sitting on the side of the bed? : Unable Difficulty sitting down on and standing up from a chair with arms (e.g., wheelchair, bedside commode, etc,.)?: Unable Help needed moving to and from a bed to chair (including a wheelchair)?: A Little Help needed walking in hospital room?: A Little Help needed climbing 3-5 steps with a railing? : A Little 6 Click Score: 12    End of Session Equipment Utilized During Treatment: Gait belt Activity Tolerance: Patient tolerated treatment well Patient left: with nursing/sitter in room;in chair;Other (comment)(outside with multiple CNA/security supervision)   PT Visit Diagnosis: Muscle weakness (generalized) (M62.81);Difficulty in walking, not elsewhere classified (R26.2);Unsteadiness on feet (R26.81);Other abnormalities of gait and mobility (R26.89)     Time: 1350-1413 PT Time Calculation (min) (ACUTE ONLY): 23 min  Charges:  $Gait Training: 8-22 mins $Therapeutic Exercise: 8-22 mins                    G Codes:      [name] D [name] PT, DPT, GCS    [name] 01/16/2018, 2:24 PM

## 2018-01-17 ENCOUNTER — Inpatient Hospital Stay: Payer: BLUE CROSS/BLUE SHIELD | Admitting: Anesthesiology

## 2018-01-17 ENCOUNTER — Encounter: Payer: Self-pay | Admitting: *Deleted

## 2018-01-17 LAB — GLUCOSE, CAPILLARY: Glucose-Capillary: 93 mg/dL (ref 70–99)

## 2018-01-17 MED ORDER — SUCCINYLCHOLINE CHLORIDE 200 MG/10ML IV SOSY
PREFILLED_SYRINGE | INTRAVENOUS | Status: DC | PRN
  Administered 2018-01-17: 80 mg via INTRAVENOUS

## 2018-01-17 MED ORDER — SUCCINYLCHOLINE CHLORIDE 20 MG/ML IJ SOLN
INTRAMUSCULAR | Status: AC
  Filled 2018-01-17: qty 1

## 2018-01-17 MED ORDER — SODIUM CHLORIDE 0.9 % IV SOLN
500.0000 mL | Freq: Once | INTRAVENOUS | Status: AC
  Administered 2018-01-17: 500 mL via INTRAVENOUS

## 2018-01-17 MED ORDER — ONDANSETRON HCL 4 MG/2ML IJ SOLN: 4.0000 mg | Freq: Once | INTRAMUSCULAR | Status: DC | PRN

## 2018-01-17 MED ORDER — FENTANYL CITRATE (PF) 100 MCG/2ML IJ SOLN: 25.0000 ug | INTRAMUSCULAR | Status: DC | PRN

## 2018-01-17 MED ORDER — GERHARDT'S BUTT CREAM
TOPICAL_CREAM | Freq: Two times a day (BID) | CUTANEOUS | Status: DC
  Administered 2018-01-17 – 2018-01-20 (×5): via TOPICAL
  Administered 2018-01-20: 1 via TOPICAL
  Administered 2018-01-21 (×2): via TOPICAL
  Administered 2018-01-22 (×2): 1 via TOPICAL
  Administered 2018-01-23 – 2018-02-07 (×13): via TOPICAL
  Filled 2018-01-17 (×3): qty 1

## 2018-01-17 MED ORDER — METHOHEXITAL SODIUM 0.5 G IJ SOLR
INTRAMUSCULAR | Status: AC
  Filled 2018-01-17: qty 500

## 2018-01-17 MED ORDER — SODIUM CHLORIDE 0.9 % IV SOLN
INTRAVENOUS | Status: DC | PRN
  Administered 2018-01-17: 10:00:00 via INTRAVENOUS

## 2018-01-17 MED ORDER — METHOHEXITAL SODIUM 100 MG/10ML IV SOSY
PREFILLED_SYRINGE | INTRAVENOUS | Status: DC | PRN
  Administered 2018-01-17: 70 mg via INTRAVENOUS

## 2018-01-17 NOTE — Anesthesia Post-op Follow-up Note (Signed)
Anesthesia QCDR form completed.        

## 2018-01-17 NOTE — B[facility] Group Notes (Signed)
LCSW Group Therapy Note  01/17/2018 1:00 pm  Type of Therapy/Topic:  Group Therapy:  Emotion Regulation  Participation Level:  Did Not Attend   Description of Group:    The purpose of this group is to assist patients in learning to regulate negative emotions and experience positive emotions. Patients will be guided to discuss ways in which they have been vulnerable to their negative emotions. These vulnerabilities will be juxtaposed with experiences of positive emotions or situations, and patients will be challenged to use positive emotions to combat negative ones. Special emphasis will be placed on coping with negative emotions in conflict situations, and patients will process healthy conflict resolution skills.  Therapeutic Goals: 1. Patient will identify two positive emotions or experiences to reflect on in order to balance out negative emotions 2. Patient will label two or more emotions that they find the most difficult to experience 3. Patient will demonstrate positive conflict resolution skills through discussion and/or role plays  Summary of Patient Progress:  Asaiah was invited to today's group, but chose not to attend.     Therapeutic Modalities:   Cognitive Behavioral Therapy Feelings Identification Dialectical Behavioral Therapy

## 2018-01-17 NOTE — Progress Notes (Signed)
[facility] MD Progress Note  01/17/2018 7:18 PM [name]  MRN:  [id number] Subjective: Follow-up 62 year old man with severe depression and near catatonia.  Patient had ECT treatment again today which was uncomplicated and well tolerated.  Seen this afternoon he continues to talk about feeling weak although the overall course has clearly been an improvement.  His energy level has actually improved quite a bit from the time he was admitted to the hospital.  Patient still remains however markedly impaired very depressed not able to do much of anything without supervision and coaching. Principal Problem: Severe recurrent major depression without psychotic features ([place]) Diagnosis:   Patient Active Problem List   Diagnosis Date Noted  . Catatonia [F06.1] 01/12/2018  . Malnutrition of moderate degree [E44.0] 01/09/2018  . Pressure injury of skin [L89.90] 01/09/2018  . Severe recurrent major depression without psychotic features ([place]) [F33.2] 01/09/2018  . Back pain [M54.9] 01/08/2018  . Shuffling gait [R26.89] 01/08/2018  . Depression [F32.9] 01/08/2018   Total Time spent with patient: 30 minutes  Past Psychiatric History: This is been going on for almost 10 years.  Long history of poor tolerance and poor cooperation with antidepressant treatment  Past Medical History:  Past Medical History:  Diagnosis Date  . Depression     Past Surgical History:  Procedure Laterality Date  . HERNIA REPAIR     Family History: History reviewed. No pertinent family history. Family Psychiatric  History: See previous note Social History:  Social History   Substance and Sexual Activity  Alcohol Use Not on file     Social History   Substance and Sexual Activity  Drug Use Not Currently    Social History   Socioeconomic History  . Marital status: Married    Spouse name: Not on file  . Number of children: Not on file  . Years of education: Not on file  . Highest education level: Not on file   Occupational History  . Not on file  Social Needs  . Financial resource strain: Not on file  . Food insecurity:    Worry: Not on file    Inability: Not on file  . Transportation needs:    Medical: Not on file    Non-medical: Not on file  Tobacco Use  . Smoking status: Former Research scientist (life sciences)  . Smokeless tobacco: Former Systems developer    Quit date: 01/10/2017  Substance and Sexual Activity  . Alcohol use: Not on file  . Drug use: Not Currently  . Sexual activity: Not Currently  Lifestyle  . Physical activity:    Days per week: Not on file    Minutes per session: Not on file  . Stress: Not on file  Relationships  . Social connections:    Talks on phone: Not on file    Gets together: Not on file    Attends religious service: Not on file    Active member of club or organization: Not on file    Attends meetings of clubs or organizations: Not on file    Relationship status: Not on file  Other Topics Concern  . Not on file  Social History Narrative  . Not on file   Additional Social History:                         Sleep: Fair  Appetite:  Fair  Current Medications: Current Facility-Administered Medications  Medication Dose Route Frequency Provider Last Rate Last Dose  . acetaminophen (TYLENOL) tablet  650 mg  650 mg Oral Q6H PRN Clapacs, Madie Reno, MD   650 mg at 01/14/18 2303  . alum & mag hydroxide-simeth (MAALOX/MYLANTA) 200-200-20 MG/5ML suspension 30 mL  30 mL Oral Q4H PRN Clapacs, John T, MD      . feeding supplement (ENSURE ENLIVE) (ENSURE ENLIVE) liquid 237 mL  237 mL Oral BID BM Clapacs, John T, MD   237 mL at 01/16/18 1439  . Gerhardt's butt cream   Topical BID Clapacs, John T, MD      . ibuprofen (ADVIL,MOTRIN) tablet 400 mg  400 mg Oral Q6H PRN Clapacs, Madie Reno, MD   400 mg at 01/16/18 2136  . magnesium hydroxide (MILK OF MAGNESIA) suspension 30 mL  30 mL Oral Daily PRN Clapacs, Madie Reno, MD   30 mL at 01/16/18 0852  . mirtazapine (REMERON) tablet 30 mg  30 mg Oral QHS  Clapacs, Madie Reno, MD   30 mg at 01/16/18 2135  . multivitamin with minerals tablet 1 tablet  1 tablet Oral Daily Clapacs, Madie Reno, MD   1 tablet at 01/17/18 1656  . pantoprazole (PROTONIX) EC tablet 40 mg  40 mg Oral Daily Clapacs, Madie Reno, MD   40 mg at 01/17/18 1658  . traZODone (DESYREL) tablet 100 mg  100 mg Oral QHS PRN Clapacs, Madie Reno, MD   100 mg at 01/16/18 2136    Lab Results:  Results for orders placed or performed during the hospital encounter of 01/12/18 (from the past 48 hour(s))  Glucose, capillary     Status: None   Collection Time: 01/17/18  6:36 AM  Result Value Ref Range   Glucose-Capillary 93 70 - 99 mg/dL    Blood Alcohol level:  Lab Results  Component Value Date   ETH <10 29/56/2130    Metabolic Disorder Labs: No results found for: HGBA1C, MPG No results found for: PROLACTIN No results found for: CHOL, TRIG, HDL, CHOLHDL, VLDL, LDLCALC  Physical Findings: AIMS:  , ,  ,  ,    CIWA:    COWS:     Musculoskeletal: Strength & Muscle Tone: decreased Gait & Station: unable to stand Patient leans: N/A  Psychiatric Specialty Exam: Physical Exam  Nursing note and vitals reviewed. Constitutional: He appears well-developed and well-nourished.  HENT:  Head: Normocephalic and atraumatic.  Eyes: Pupils are equal, round, and reactive to light. Conjunctivae are normal.  Neck: Normal range of motion.  Cardiovascular: Regular rhythm and normal heart sounds.  Respiratory: Effort normal. No respiratory distress.  GI: Soft.  Musculoskeletal: Normal range of motion.  Neurological: He is alert.  Skin: Skin is warm and dry.  Psychiatric: His affect is blunt. His speech is delayed. He is slowed. Cognition and memory are impaired. He expresses impulsivity. He expresses no homicidal and no suicidal ideation.    Review of Systems  Constitutional: Negative.   HENT: Negative.   Eyes: Negative.   Respiratory: Negative.   Cardiovascular: Negative.   Gastrointestinal:  Negative.   Musculoskeletal: Negative.   Skin: Negative.   Neurological: Positive for weakness.  Psychiatric/Behavioral: Positive for depression. Negative for hallucinations, memory loss, substance abuse and suicidal ideas. The patient is nervous/anxious. The patient does not have insomnia.     Blood pressure (!) 142/77, pulse 70, temperature 98.9 F (37.2 C), resp. rate 10, height 5\' 10"  (1.778 m), weight 68.9 kg (152 lb), SpO2 99 %.Body mass index is 21.81 kg/m.  General Appearance: Disheveled  Eye Contact:  Minimal  Speech:  Blocked  and Slow  Volume:  Decreased  Mood:  Depressed  Affect:  Congruent  Thought Process:  Coherent  Orientation:  Full (Time, Place, and Person)  Thought Content:  Logical, Rumination and Tangential  Suicidal Thoughts:  No  Homicidal Thoughts:  No  Memory:  Immediate;   Fair Recent;   Fair Remote;   Fair  Judgement:  Fair  Insight:  Fair  Psychomotor Activity:  Decreased  Concentration:  Concentration: Poor  Recall:  [employer] of Knowledge:  Fair  Language:  Poor  Akathisia:  No  Handed:  Right  AIMS (if indicated):     Assets:  Desire for Improvement Housing  ADL's:  Impaired  Cognition:  Impaired,  Mild and Moderate  Sleep:  Number of Hours: 8     Treatment Plan Summary: Daily contact with patient to assess and evaluate symptoms and progress in treatment, Medication management and Plan Patient seems to be overall showing a little bit of improvement.  Lots of encouragement to the patient.  No change to medication today.  Continue antidepressants and ECT next treatment scheduled for Friday.  Continue physical therapy and encouragement and one-to-one attention on the unit  Alethia Berthold, MD 01/17/2018, 7:18 PM

## 2018-01-17 NOTE — Progress Notes (Signed)
Recreation Therapy Notes  Date: 01/17/2018  Time: 9:30 am   Location: Craft Room   Behavioral response: N/A   Intervention Topic:  Coping  Discussion/Intervention: Patient did not attend group.   Clinical Observations/Feedback:  Patient did not attend group.   Bari Leib LRT/CTRS         Sunita Demond 01/17/2018 12:00 PM

## 2018-01-17 NOTE — Anesthesia Postprocedure Evaluation (Signed)
Anesthesia Post Note  Patient: [name]  Procedure(s) Performed: ECT TX  Patient location during evaluation: PACU Anesthesia Type: General Level of consciousness: awake and alert and oriented Pain management: pain level controlled Vital Signs Assessment: post-procedure vital signs reviewed and stable Respiratory status: spontaneous breathing Cardiovascular status: blood pressure returned to baseline Anesthetic complications: no     Last Vitals:  Vitals:   01/17/18 1146 01/17/18 1153  BP: (!) 145/79 (!) 142/77  Pulse: 70   Resp: 10 10  Temp:  37.2 C  SpO2: 99%     Last Pain:  Vitals:   01/17/18 1251  TempSrc:   PainSc: 0-No pain                 Jaelee Laughter

## 2018-01-17 NOTE — Transfer of Care (Signed)
Immediate Anesthesia Transfer of Care Note  Patient: [name]  Procedure(s) Performed: ECT TX  Patient Location: PACU  Anesthesia Type:General  Level of Consciousness: sedated  Airway & Oxygen Therapy: Patient Spontanous Breathing and Patient connected to face mask oxygen  Post-op Assessment: Report given to RN and Post -op Vital signs reviewed and stable  Post vital signs: Reviewed and stable  Last Vitals:  Vitals Value Taken Time  BP 161/77 01/17/2018 11:24 AM  Temp    Pulse 64 01/17/2018 11:25 AM  Resp 11 01/17/2018 11:25 AM  SpO2 100 % 01/17/2018 11:25 AM  Vitals shown include unvalidated device data.  Last Pain:  Vitals:   01/17/18 0921  TempSrc:   PainSc: 4       Patients Stated Pain Goal: 0 (38/37/79 3968)  Complications: No apparent anesthesia complications

## 2018-01-17 NOTE — Anesthesia Preprocedure Evaluation (Signed)
Anesthesia Evaluation  Patient identified by MRN, date of birth, ID band Patient awake    Reviewed: Allergy & Precautions, NPO status , Patient's Chart, lab work & pertinent test results  History of Anesthesia Complications Negative for: history of anesthetic complications  Airway Mallampati: II       Dental  (+) Dental Advidsory Given   Pulmonary neg sleep apnea, neg COPD, former smoker,           Cardiovascular (-) hypertension(-) Past MI and (-) CHF (-) dysrhythmias (-) Valvular Problems/Murmurs     Neuro/Psych neg Seizures PSYCHIATRIC DISORDERS Depression    GI/Hepatic Neg liver ROS, neg GERD  ,  Endo/Other  neg diabetes  Renal/GU negative Renal ROS     Musculoskeletal negative musculoskeletal ROS (+)   Abdominal   Peds negative pediatric ROS (+)  Hematology   Anesthesia Other Findings Past Medical History: No date: Depression   Reproductive/Obstetrics                             Anesthesia Physical  Anesthesia Plan  ASA: II  Anesthesia Plan: General   Post-op Pain Management:    Induction: Intravenous  PONV Risk Score and Plan: 2 and TIVA  Airway Management Planned: Mask  Additional Equipment:   Intra-op Plan:   Post-operative Plan:   Informed Consent: I have reviewed the patients History and Physical, chart, labs and discussed the procedure including the risks, benefits and alternatives for the proposed anesthesia with the patient or authorized representative who has indicated his/her understanding and acceptance.     Plan Discussed with:   Anesthesia Plan Comments:         Anesthesia Quick Evaluation                                  Anesthesia Evaluation  Patient identified by MRN, date of birth, ID band Patient awake    Reviewed: Allergy & Precautions, NPO status , Patient's Chart, lab work & pertinent test results  History of Anesthesia  Complications Negative for: history of anesthetic complications  Airway Mallampati: II       Dental   Pulmonary neg sleep apnea, neg COPD, former smoker,           Cardiovascular (-) hypertension(-) Past MI and (-) CHF (-) dysrhythmias (-) Valvular Problems/Murmurs     Neuro/Psych neg Seizures Depression    GI/Hepatic Neg liver ROS, neg GERD  ,  Endo/Other  neg diabetes  Renal/GU negative Renal ROS     Musculoskeletal   Abdominal   Peds  Hematology   Anesthesia Other Findings   Reproductive/Obstetrics                            Anesthesia Physical Anesthesia Plan  ASA: II  Anesthesia Plan: General   Post-op Pain Management:    Induction: Intravenous  PONV Risk Score and Plan: 2 and TIVA  Airway Management Planned: Mask  Additional Equipment:   Intra-op Plan:   Post-operative Plan:   Informed Consent: I have reviewed the patients History and Physical, chart, labs and discussed the procedure including the risks, benefits and alternatives for the proposed anesthesia with the patient or authorized representative who has indicated his/her understanding and acceptance.     Plan Discussed with:   Anesthesia Plan Comments:  Anesthesia Quick Evaluation

## 2018-01-17 NOTE — Plan of Care (Signed)
  Problem: Education: Goal: Utilization of techniques to improve thought processes will improve Outcome: Progressing  Patient is progressing, using coping mechanisms

## 2018-01-17 NOTE — Consult Note (Signed)
[facility] Nurse wound consult note Reason for Consult:Moisutre associated skin damage to buttocks.  Frequent exposure to moisture and self neglect.  Wound type:MASD Pressure Injury POA: NA Measurement:Blanchable erythema to buttocks Wound KGY:JEHU Drainage (amount, consistency, odor) none Periwound:intact Dressing procedure/placement/frequency: Gerhardts butt paste twice daily and keep skin clean and dry.  Will not follow at this time.  Please re-consult if needed.  Domenic Moras RN BSN [place] Pager [phone]

## 2018-01-17 NOTE — Anesthesia Procedure Notes (Signed)
Date/Time: 01/17/2018 11:14 AM Performed by: Dionne Bucy, CRNA Pre-anesthesia Checklist: Patient identified, Emergency Drugs available, Suction available and Patient being monitored Patient Re-evaluated:Patient Re-evaluated prior to induction Oxygen Delivery Method: Circle system utilized Preoxygenation: Pre-oxygenation with 100% oxygen Induction Type: IV induction Ventilation: Mask ventilation without difficulty and Mask ventilation throughout procedure Airway Equipment and Method: Bite block Placement Confirmation: positive ETCO2 Dental Injury: Teeth and Oropharynx as per pre-operative assessment

## 2018-01-17 NOTE — Procedures (Signed)
ECT SERVICES Physician's Interval Evaluation & Treatment Note  Patient Identification: [name] MRN:  [id number] Date of Evaluation:  01/17/2018 TX #: 3  MADRS:   MMSE:   P.E. Findings:  Physical findings are the same except that he is complaining of semiacute pain in the right groin area  Psychiatric Interval Note:  Mood continues to be flat down and withdrawn  Subjective:  Patient is a 62 y.o. male seen for evaluation for Electroconvulsive Therapy. Continues to have some degree of depression and also now is complaining of pain in the groin  Treatment Summary:   []   Right Unilateral             [x]  Bilateral   % Energy : 1.0 ms 70%   Impedance: 1090 ohms  Seizure Energy Index: 1258 V squared  Postictal Suppression Index: 43%  Seizure Concordance Index: 93%  Medications  Pre Shock: Brevital 70 mg succinylcholine 80 mg  Post Shock:    Seizure Duration: 32 seconds EMG 35 seconds EEG   Comments: Continue next treatment Friday consider turning the energy up to 100%.  Lungs:  [x]   Clear to auscultation               []  Other:   Heart:    [x]   Regular rhythm             []  irregular rhythm    [x]   Previous H&P reviewed, patient examined and there are NO CHANGES                 []   Previous H&P reviewed, patient examined and there are changes noted.   Alethia Berthold, MD 7/10/201911:05 AM

## 2018-01-17 NOTE — H&P (Signed)
[name] is an 62 y.o. male.   Chief Complaint: Patient acknowledges feeling slightly better with strength somewhat improved. HPI: History of long-standing depression with near catatonia gradually improving  Past Medical History:  Diagnosis Date  . Depression     Past Surgical History:  Procedure Laterality Date  . HERNIA REPAIR      History reviewed. No pertinent family history. Social History:  reports that he has quit smoking. He quit smokeless tobacco use about a year ago. He reports that he has current or past drug history. His alcohol history is not on file.  Allergies: No Known Allergies  Medications Prior to Admission  Medication Sig Dispense Refill  . feeding supplement, ENSURE ENLIVE, (ENSURE ENLIVE) LIQD Take 237 mLs by mouth 2 (two) times daily between meals. 237 mL 12  . ibuprofen (ADVIL,MOTRIN) 600 MG tablet Take 1 tablet (600 mg total) by mouth every 6 (six) hours as needed for moderate pain. 30 tablet 0  . Multiple Vitamin (MULTIVITAMIN WITH MINERALS) TABS tablet Take 1 tablet by mouth daily. 30 tablet 0  . predniSONE (STERAPRED UNI-PAK 21 TAB) 10 MG (21) TBPK tablet Taper by 10 mg daily 21 tablet 0  . vitamin B-12 1000 MCG tablet Take 1 tablet (1,000 mcg total) by mouth daily. 30 tablet 0  . Vitamin D, Ergocalciferol, (DRISDOL) 50000 units CAPS capsule Take 1 capsule (50,000 Units total) by mouth every 7 (seven) days. 5 capsule 0    Results for orders placed or performed during the hospital encounter of 01/12/18 (from the past 48 hour(s))  Glucose, capillary     Status: None   Collection Time: 01/17/18  6:36 AM  Result Value Ref Range   Glucose-Capillary 93 70 - 99 mg/dL   No results found.  Review of Systems  Constitutional: Negative.   HENT: Negative.   Eyes: Negative.   Respiratory: Negative.   Cardiovascular: Negative.   Gastrointestinal: Negative.   Musculoskeletal: Negative.   Skin: Negative.   Neurological: Positive for weakness.   Psychiatric/Behavioral: Positive for depression. Negative for hallucinations, memory loss, substance abuse and suicidal ideas. The patient is not nervous/anxious and does not have insomnia.     Blood pressure 101/67, pulse 67, temperature 98.3 F (36.8 C), temperature source Oral, resp. rate 18, height 5\' 10"  (1.778 m), weight 68.9 kg (152 lb), SpO2 97 %. Physical Exam  Nursing note and vitals reviewed. Constitutional: He appears well-developed and well-nourished.  HENT:  Head: Normocephalic and atraumatic.  Eyes: Pupils are equal, round, and reactive to light. Conjunctivae are normal.  Neck: Normal range of motion.  Cardiovascular: Regular rhythm and normal heart sounds.  Respiratory: Effort normal. No respiratory distress.  GI: Soft.  Musculoskeletal: Normal range of motion.  Neurological: He is alert.  Skin: Skin is warm and dry.  Psychiatric: Judgment normal. His affect is blunt. His speech is delayed. He is slowed. Cognition and memory are impaired. He expresses no suicidal ideation.     Assessment/Plan Continue inpatient treatment while we do bilateral ECT and medication management  Alethia Berthold, MD 01/17/2018, 10:14 AM

## 2018-01-17 NOTE — Progress Notes (Signed)
Patient contiues to be 1:1 no distress noted sitter at bedside.

## 2018-01-17 NOTE — Progress Notes (Signed)
Patient alert and oriented x 2 with periods of confusion to place and situation, he denies SI/HI/AVH, no distress noted continues to be on 1:1 for safety, sitter at bedside, encouraged frequent turning and repositioning by patient also encouraged sitter to give cues.Thoughts are less disorganized, speech is soft and sometimes tangential, rated depression a 5/10, scheduled for ECT tomorrow and patient aware. 15 minutes safety checks maintained will continue to closely monitor.

## 2018-01-17 NOTE — Progress Notes (Addendum)
Received [name] this AM in his room asleep with  his sitter i at the bedside. He was taken to ECT and returned without incident. He ate lunch and received his AM medications. He is eating very slowly. During our one to one conversations his responses are very slow with thought blocking.   0730 In bed asleep 0800 in bed asleep 0900 In ECT 1200 OOB in the wheelchair eating lunch 1300 OOB in the wheelchair eating lunch  1400 Sleep in his room 1500 Sleep in his room 1600 Sleep in his room  1700 OOB to the bathroom/ eating dinner in his room 1800  visiting with relatives 68  visiting with relatives

## 2018-01-18 ENCOUNTER — Other Ambulatory Visit: Payer: Self-pay | Admitting: Psychiatry

## 2018-01-18 MED ORDER — MIRTAZAPINE 15 MG PO TABS
45.0000 mg | ORAL_TABLET | Freq: Every day | ORAL | Status: DC
  Administered 2018-01-18 – 2018-02-07 (×21): 45 mg via ORAL
  Filled 2018-01-18 (×21): qty 3

## 2018-01-18 NOTE — Progress Notes (Signed)
Recreation Therapy Notes  Date: 01/18/2018  Time: 3:00pm  Location: Craft room  Behavioral response: N/A  Group Type: Game  Participation level: N/A  Communication: Patient did not attend group.  Comments: N/A  Edelmiro Innocent LRT/CTRS        Asheley Hellberg 01/18/2018 3:41 PM

## 2018-01-18 NOTE — Progress Notes (Addendum)
Please see Plan of Care note for detailed note. 2000- Patient OOB with walker in room. Sitter encouraging more independence and movement. Sitter at side to assist. 2100- Patient sitting in chair, pillow utilized for avoidance of skin breakdown over boney prominences and overall comfort. Patient OOB for more than 3 hours this evening. Ate snack. Compliant with HS medications and 1:1 sitter.  2200- Patient remains in chair. Encouraged to reposition himself with assistance so as to avoid skin breakdown. Verbalizes understanding. Pain medication utilized to avoid his resistance due to pain. 2300- Patient lying in bed. Falls asleep quickly. Sitter at bedside. No apparent distress. 0000- Patient asleep. No apparent distress. Sitter at bedside. 0100- Patient asleep. No apparent distress. Sitter at bedside. 0200- Patient asleep. No apparent distress. Sitter at bedside. 0300- Patient asleep. No apparent distress. Sitter at bedside. 0400- Patient asleep. No apparent distress. Sitter at bedside. 0500- Patient asleep. No apparent distress. Sitter at bedside. 0600- Patient asleep. No apparent distress. Sitter at bedside.VS obtained bedside. 0700-

## 2018-01-18 NOTE — B[facility] Group Notes (Signed)
  01/18/2018  Time: 1PM  Type of Therapy/Topic:  Group Therapy:  Balance in Life  Participation Level:  Active  Description of Group:   This group will address the concept of balance and how it feels and looks when one is unbalanced. Patients will be encouraged to process areas in their lives that are out of balance and identify reasons for remaining unbalanced. Facilitators will guide patients in utilizing problem-solving interventions to address and correct the stressor making their life unbalanced. Understanding and applying boundaries will be explored and addressed for obtaining and maintaining a balanced life. Patients will be encouraged to explore ways to assertively make their unbalanced needs known to significant others in their lives, using other group members and facilitator for support and feedback.  Therapeutic Goals: 1. Patient will identify two or more emotions or situations they have that consume much of in their lives. 2. Patient will identify signs/triggers that life has become out of balance:  3. Patient will identify two ways to set boundaries in order to achieve balance in their lives:  4. Patient will demonstrate ability to communicate their needs through discussion and/or role plays  Summary of Patient Progress: Pt continues to work towards their tx goals but has not yet reached them. Pt was able to appropriately participate in group discussion, and was able to offer support/validation to other group members. PT reported one area of his life he would like to devote less attention to is, "my relationships." Pt reported one area of his life he'd like to devote more attention to is, "my mental health."   Therapeutic Modalities:   Cognitive Behavioral Therapy Solution-Focused Therapy Assertiveness Training  Alden Hipp, MSW, LCSW Clinical Social Worker 01/18/2018 3:00 PM

## 2018-01-18 NOTE — Progress Notes (Signed)
Hourly rounding: 0800: In community room eating breakfast 1:1 for safety 0900: In community room eating breakfast 1:1 for safety 1000: In room sitting up in wheelchair.  1:1 for safety 11:00: In bed eyes closed.  Safety sitter maintained. 1200: Up to dayroom.  1:1 maintained 1300: In group.  1:1 maintained.  1400: In room.  1:1 maintained.  1500: Resting in bed, 1:1 maintained 1600: Resting in bed. 1:1 maintained 1700: Dayroom eating.  1:1 maintained 1800: dayroom watching tv 1:1 maintained 1900: visiting with friends. 1:1 maintained.

## 2018-01-18 NOTE — Progress Notes (Signed)
   01/18/18 1430  Clinical Encounter Type  Visited With Patient  Visit Type Follow-up;Psychological support;Spiritual support;Behavioral Health  Referral From Nurse  Consult/Referral To Chaplain  Spiritual Encounters  Spiritual Needs Prayer;Emotional   [name] visited with PT in the [place]. PT had just completed a shower and was more talkative than in the past encounters. PT shared during my last encounter that his sister called him stupid and ugly when he was younger. He appears to have internalized this image of himself. 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   I have tried to reassure the PT with compliments while trying to encourage the PT to think of times when he was successful. I was able to get the PT to chuckle but still makes little eye contact. He is opening up some to me and I will continue to visit and encourage PT.

## 2018-01-18 NOTE — B[facility] Group Notes (Signed)
LCSW Group Therapy Note 01/18/2018 9:00 AM  Type of Therapy and Topic:  Group Therapy:  Setting Goals  Participation Level:  None  Description of Group: In this process group, patients discussed using strengths to work toward goals and address challenges.  Patients identified two positive things about themselves and one goal they were working on.  Patients were given the opportunity to share openly and support each other's plan for self-empowerment.  The group discussed the value of gratitude and were encouraged to have a daily reflection of positive characteristics or circumstances.  Patients were encouraged to identify a plan to utilize their strengths to work on current challenges and goals.  Therapeutic Goals 1. Patient will verbalize personal strengths/positive qualities and relate how these can assist with achieving desired personal goals 2. Patients will verbalize affirmation of peers plans for personal change and goal setting 3. Patients will explore the value of gratitude and positive focus as related to successful achievement of goals 4. Patients will verbalize a plan for regular reinforcement of personal positive qualities and circumstances.  Summary of Patient Progress:  [place] presented to today's group somewhat disorganized.  He was unable to actively participate in today's group.  CSW attempted to engage him in the group discussion by asking him to identify one goal that he would like to work on while in the hospital or when he is discharged.  Reginald stated, "I don't really have a goal".     Therapeutic Modalities Cognitive Behavioral Therapy Motivational Interviewing    Devona Konig, Marlinda Mike 01/18/2018 2:43 PM

## 2018-01-18 NOTE — Plan of Care (Signed)
  Problem: Activity: Goal: Interest or engagement in activities will improve Outcome: Progressing Goal: Sleeping patterns will improve Outcome: Progressing   Problem: Coping: Goal: Ability to verbalize frustrations and anger appropriately will improve Outcome: Progressing   Problem: Safety: Goal: Periods of time without injury will increase Outcome: Progressing   Problem: Education: Goal: Utilization of techniques to improve thought processes will improve Outcome: Progressing   Problem: Activity: Goal: Interest or engagement in leisure activities will improve Outcome: Progressing   Problem: Coping: Goal: Will verbalize feelings Outcome: Progressing   Problem: Safety: Goal: Ability to disclose and discuss suicidal ideas will improve Outcome: Progressing   Problem: Education: Goal: Knowledge of General Education information will improve Outcome: Progressing

## 2018-01-18 NOTE — Progress Notes (Signed)
[facility] MD Progress Note  01/18/2018 6:36 PM [name]  MRN:  [id number] Subjective: Follow-up for 62 year old man with severe depression receiving ECT.  Patient told me this evening that he was "feeling melancholy".  He remains very slowed and blunted in his affect.  Denied suicidal ideation.  Denied psychosis.  He is eating a little better and is cooperating with efforts to get him cleaned up and moving. Principal Problem: Severe recurrent major depression without psychotic features ([place]) Diagnosis:   Patient Active Problem List   Diagnosis Date Noted  . Catatonia [F06.1] 01/12/2018  . Malnutrition of moderate degree [E44.0] 01/09/2018  . Pressure injury of skin [L89.90] 01/09/2018  . Severe recurrent major depression without psychotic features ([place]) [F33.2] 01/09/2018  . Back pain [M54.9] 01/08/2018  . Shuffling gait [R26.89] 01/08/2018  . Depression [F32.9] 01/08/2018   Total Time spent with patient: 20 minutes  Past Psychiatric History: Patient has a long history of depression going back years  Past Medical History:  Past Medical History:  Diagnosis Date  . Depression     Past Surgical History:  Procedure Laterality Date  . HERNIA REPAIR     Family History: History reviewed. No pertinent family history. Family Psychiatric  History: None available Social History:  Social History   Substance and Sexual Activity  Alcohol Use Not on file     Social History   Substance and Sexual Activity  Drug Use Not Currently    Social History   Socioeconomic History  . Marital status: Married    Spouse name: Not on file  . Number of children: Not on file  . Years of education: Not on file  . Highest education level: Not on file  Occupational History  . Not on file  Social Needs  . Financial resource strain: Not on file  . Food insecurity:    Worry: Not on file    Inability: Not on file  . Transportation needs:    Medical: Not on file    Non-medical: Not on file   Tobacco Use  . Smoking status: Former Research scientist (life sciences)  . Smokeless tobacco: Former Systems developer    Quit date: 01/10/2017  Substance and Sexual Activity  . Alcohol use: Not on file  . Drug use: Not Currently  . Sexual activity: Not Currently  Lifestyle  . Physical activity:    Days per week: Not on file    Minutes per session: Not on file  . Stress: Not on file  Relationships  . Social connections:    Talks on phone: Not on file    Gets together: Not on file    Attends religious service: Not on file    Active member of club or organization: Not on file    Attends meetings of clubs or organizations: Not on file    Relationship status: Not on file  Other Topics Concern  . Not on file  Social History Narrative  . Not on file   Additional Social History:                         Sleep: Fair  Appetite:  Fair  Current Medications: Current Facility-Administered Medications  Medication Dose Route Frequency Provider Last Rate Last Dose  . acetaminophen (TYLENOL) tablet 650 mg  650 mg Oral Q6H PRN Kimarie Coor, Madie Reno, MD   650 mg at 01/14/18 2303  . alum & mag hydroxide-simeth (MAALOX/MYLANTA) 200-200-20 MG/5ML suspension 30 mL  30 mL Oral Q4H PRN  Baylon Santelli, Madie Reno, MD      . feeding supplement (ENSURE ENLIVE) (ENSURE ENLIVE) liquid 237 mL  237 mL Oral BID BM Prabhav Faulkenberry T, MD   237 mL at 01/18/18 1744  . Gerhardt's butt cream   Topical BID Petrita Blunck T, MD      . ibuprofen (ADVIL,MOTRIN) tablet 400 mg  400 mg Oral Q6H PRN Marshun Duva, Madie Reno, MD   400 mg at 01/17/18 2055  . magnesium hydroxide (MILK OF MAGNESIA) suspension 30 mL  30 mL Oral Daily PRN Teonia Yager, Madie Reno, MD   30 mL at 01/16/18 0852  . mirtazapine (REMERON) tablet 45 mg  45 mg Oral QHS Mandy Fitzwater T, MD      . multivitamin with minerals tablet 1 tablet  1 tablet Oral Daily Cambrea Kirt, Madie Reno, MD   1 tablet at 01/18/18 1028  . pantoprazole (PROTONIX) EC tablet 40 mg  40 mg Oral Daily Josselyn Harkins, Madie Reno, MD   40 mg at 01/18/18 1027  .  traZODone (DESYREL) tablet 100 mg  100 mg Oral QHS PRN Raif Chachere, Madie Reno, MD   100 mg at 01/17/18 2055    Lab Results:  Results for orders placed or performed during the hospital encounter of 01/12/18 (from the past 48 hour(s))  Glucose, capillary     Status: None   Collection Time: 01/17/18  6:36 AM  Result Value Ref Range   Glucose-Capillary 93 70 - 99 mg/dL    Blood Alcohol level:  Lab Results  Component Value Date   ETH <10 16/07/930    Metabolic Disorder Labs: No results found for: HGBA1C, MPG No results found for: PROLACTIN No results found for: CHOL, TRIG, HDL, CHOLHDL, VLDL, LDLCALC  Physical Findings: AIMS:  , ,  ,  ,    CIWA:    COWS:     Musculoskeletal: Strength & Muscle Tone: atrophy Gait & Station: unable to stand Patient leans: N/A  Psychiatric Specialty Exam: Physical Exam  Nursing note and vitals reviewed. Constitutional: He appears well-developed and well-nourished.  HENT:  Head: Normocephalic and atraumatic.  Eyes: Pupils are equal, round, and reactive to light. Conjunctivae are normal.  Neck: Normal range of motion.  Cardiovascular: Regular rhythm and normal heart sounds.  Respiratory: Effort normal. No respiratory distress.  GI: Soft.  Musculoskeletal: Normal range of motion.  Neurological: He is alert.  Skin: Skin is warm and dry.  Psychiatric: His affect is blunt. His speech is delayed. He is slowed. He expresses inappropriate judgment. He exhibits a depressed mood. He expresses no homicidal and no suicidal ideation. He exhibits abnormal recent memory.    Review of Systems  Constitutional: Negative.   HENT: Negative.   Eyes: Negative.   Respiratory: Negative.   Cardiovascular: Negative.   Gastrointestinal: Negative.   Musculoskeletal: Negative.   Skin: Negative.   Neurological: Positive for weakness.  Psychiatric/Behavioral: Positive for depression and memory loss. Negative for hallucinations, substance abuse and suicidal ideas. The  patient is not nervous/anxious and does not have insomnia.     Blood pressure 95/62, pulse (!) 59, temperature 97.9 F (36.6 C), temperature source Oral, resp. rate 18, height 5\' 10"  (1.778 m), weight 68.9 kg (152 lb), SpO2 98 %.Body mass index is 21.81 kg/m.  General Appearance: Casual  Eye Contact:  Minimal  Speech:  Slow  Volume:  Decreased  Mood:  Depressed and Dysphoric  Affect:  Blunt  Thought Process:  Coherent  Orientation:  Full (Time, Place, and Person)  Thought Content:  Illogical and Rumination  Suicidal Thoughts:  No  Homicidal Thoughts:  No  Memory:  Immediate;   Fair Recent;   Fair Remote;   Fair  Judgement:  Fair  Insight:  Fair  Psychomotor Activity:  Decreased  Concentration:  Concentration: Fair  Recall:  [employer] of Knowledge:  Fair  Language:  Fair  Akathisia:  No  Handed:  Right  AIMS (if indicated):     Assets:  Desire for Improvement Housing Social Support  ADL's:  Impaired  Cognition:  Impaired,  Mild and Moderate  Sleep:  Number of Hours: 7.5     Treatment Plan Summary: Daily contact with patient to assess and evaluate symptoms and progress in treatment, Medication management and Plan Patient with major depression.  Has started receiving ECT.  Slow if any improvement at this point although tolerating the treatment well.  Today I am increasing the dose of his Remeron to 45 mg at night.  Next ECT treatment tomorrow.  Praised patient for his efforts to attend groups and be more physically active.  Alethia Berthold, MD 01/18/2018, 6:36 PM

## 2018-01-18 NOTE — Progress Notes (Signed)
Recreation Therapy Notes  Date: 01/18/2018  Time: 9:30 am  Location: Craft Room  Behavioral response: Not Engaged  Intervention Topic: Anger Management  Discussion/Intervention:  Group content on today was focused on anger management. The group defined anger and reasons they become angry. Individuals expressed negative way they have dealt with anger in the past. Patients stated some positive ways they could deal with anger in the future. The group described how anger can affect your health and daily plans. Individuals participated in the intervention "Score your anger" where they had a chance to answer questions about themselves and get a score of their anger.  Clinical Observations/Feedback:  Patient came to group but did not provide any contribution towards group. He left group early due to unknown reasons. Shai Mckenzie LRT/CTRS         Landan Fedie 01/18/2018 12:23 PM

## 2018-01-18 NOTE — Plan of Care (Addendum)
Patient found in room, sitting in chair, sitter at bedside upon my arrival. Patient is neither visible nor social this evening. Patient remains isolative to his room most of the evening. Compliant with 1:1 sitter and safety education. Denies SI/HI/AVH. Continues to report depression. Affect congruent. Minimally verbal. Thought-blocking or Aphasia observed. Processing is slow. Patient is pleasant and polite. Reports eating and voiding adequately. Complains of pain with repositioning, given Motrin with positive results. Compliant with HS medications and staff direction. Remains on 1:1 due to severe physical limitations. Will continue to monitor throughout the shift. Patient slept 7.5 hours. No apparent distress. Remains on 1:1 and compliant. Will endorse care to oncoming shift.  Problem: Safety: Goal: Periods of time without injury will increase Outcome: Progressing   Problem: Coping: Goal: Will verbalize feelings Outcome: Progressing   Problem: Activity: Goal: Interest or engagement in activities will improve Outcome: Not Progressing Goal: Sleeping patterns will improve Outcome: Not Progressing   Problem: Education: Goal: Utilization of techniques to improve thought processes will improve Outcome: Not Progressing

## 2018-01-19 ENCOUNTER — Inpatient Hospital Stay: Payer: BLUE CROSS/BLUE SHIELD

## 2018-01-19 ENCOUNTER — Inpatient Hospital Stay: Payer: BLUE CROSS/BLUE SHIELD | Admitting: Registered Nurse

## 2018-01-19 LAB — GLUCOSE, CAPILLARY: GLUCOSE-CAPILLARY: 89 mg/dL (ref 70–99)

## 2018-01-19 MED ORDER — METHOHEXITAL SODIUM 100 MG/10ML IV SOSY
PREFILLED_SYRINGE | INTRAVENOUS | Status: DC | PRN
  Administered 2018-01-19: 70 mg via INTRAVENOUS

## 2018-01-19 MED ORDER — SODIUM CHLORIDE 0.9 % IV SOLN
INTRAVENOUS | Status: DC | PRN
  Administered 2018-01-19: 09:00:00 via INTRAVENOUS

## 2018-01-19 MED ORDER — ONDANSETRON HCL 4 MG/2ML IJ SOLN: 4.0000 mg | Freq: Once | INTRAMUSCULAR | Status: DC | PRN

## 2018-01-19 MED ORDER — KETOROLAC TROMETHAMINE 30 MG/ML IJ SOLN: 15.0000 mg | Freq: Once | INTRAMUSCULAR | Status: DC

## 2018-01-19 MED ORDER — SUCCINYLCHOLINE CHLORIDE 20 MG/ML IJ SOLN
INTRAMUSCULAR | Status: DC | PRN
  Administered 2018-01-19: 80 mg via INTRAVENOUS

## 2018-01-19 MED ORDER — SODIUM CHLORIDE 0.9 % IV SOLN
500.0000 mL | Freq: Once | INTRAVENOUS | Status: AC
  Administered 2018-01-19: 500 mL via INTRAVENOUS

## 2018-01-19 MED ORDER — FENTANYL CITRATE (PF) 100 MCG/2ML IJ SOLN: 25.0000 ug | INTRAMUSCULAR | Status: DC | PRN

## 2018-01-19 NOTE — Progress Notes (Signed)
Recreation Therapy Notes  Date: 01/19/2018  Time: 9:30 am   Location: Craft Room   Behavioral response: N/A   Intervention Topic:  Team Work  Discussion/Intervention: Patient did not attend group.   Clinical Observations/Feedback:  Patient did not attend group.   Janisse Ghan LRT/CTRS        Jameer Storie 01/19/2018 12:04 PM

## 2018-01-19 NOTE — Plan of Care (Signed)
Pt out in dayroom most of evening. Pt did have snack in dayroom, took medications then escorted to room by sitter. Pt can answer questions, just delayed. Pt's medicated cream put on buttocks. Pt is receptive to treatment and safety maintained on unit.  Problem: Activity: Goal: Interest or engagement in activities will improve Outcome: Progressing   Problem: Coping: Goal: Ability to verbalize frustrations and anger appropriately will improve Outcome: Progressing   Problem: Safety: Goal: Periods of time without injury will increase Outcome: Progressing   Problem: Education: Goal: Utilization of techniques to improve thought processes will improve Outcome: Progressing

## 2018-01-19 NOTE — Procedures (Signed)
ECT SERVICES Physician's Interval Evaluation & Treatment Note  Patient Identification: [name] MRN:  [id number] Date of Evaluation:  01/19/2018 TX #: [id number]  MADRS:   MMSE:   P.E. Findings:  No change to physical unfortunately.  Psychiatric Interval Note:  A little bit more active but still not feeling like he is less depressed  Subjective:  Patient is a 62 y.o. male seen for evaluation for Electroconvulsive Therapy. Does not necessarily feel different  Treatment Summary:   []   Right Unilateral             [x]  Bilateral   % Energy : 1.0 ms, 100%   Impedance: 1790 ohms  Seizure Energy Index: 3902 V squared  Postictal Suppression Index: 87%  Seizure Concordance Index: 99%  Medications  Pre Shock: Brevital 70 mg succinylcholine 80 mg  Post Shock:    Seizure Duration: 28 seconds EMG 46 seconds EEG   Comments: Follow-up Wednesday  Lungs:  [x]   Clear to auscultation               []  Other:   Heart:    [x]   Regular rhythm             []  irregular rhythm    [x]   Previous H&P reviewed, patient examined and there are NO CHANGES                 []   Previous H&P reviewed, patient examined and there are changes noted.   Alethia Berthold, MD 7/12/20198:47 AM

## 2018-01-19 NOTE — Anesthesia Post-op Follow-up Note (Signed)
Anesthesia QCDR form completed.        

## 2018-01-19 NOTE — Anesthesia Postprocedure Evaluation (Signed)
Anesthesia Post Note  Patient: [name]  Procedure(s) Performed: ECT TX  Patient location during evaluation: PACU Anesthesia Type: General Level of consciousness: awake and alert and oriented Pain management: pain level controlled Vital Signs Assessment: post-procedure vital signs reviewed and stable Respiratory status: spontaneous breathing Cardiovascular status: blood pressure returned to baseline Anesthetic complications: no     Last Vitals:  Vitals:   01/19/18 1032 01/19/18 1042  BP: 132/78 137/80  Pulse: 73 72  Resp: (!) 9 12  Temp:  37.1 C  SpO2: 100% 98%    Last Pain:  Vitals:   01/19/18 1042  TempSrc:   PainSc: 0-No pain                 Vernetta Dizdarevic

## 2018-01-19 NOTE — Progress Notes (Signed)
PT Cancellation Note  Patient Details Name: [name] MRN: [id number] DOB: Oct 29, 1955   Cancelled Treatment:     PT planned to see pt this morning. PT reviewed chart. Pt currently off floor for procedure. PT will attempt to see pt on next date that pt is medically appropriate.  [employer], SPT   Kamerin Axford 01/19/2018, 8:22 AM

## 2018-01-19 NOTE — Anesthesia Procedure Notes (Signed)
Performed by: Kambre Messner, CRNA Pre-anesthesia Checklist: Patient identified, Emergency Drugs available, Suction available and Patient being monitored Patient Re-evaluated:Patient Re-evaluated prior to induction Oxygen Delivery Method: Circle system utilized Preoxygenation: Pre-oxygenation with 100% oxygen Induction Type: IV induction Ventilation: Mask ventilation without difficulty and Mask ventilation throughout procedure Airway Equipment and Method: Bite block Placement Confirmation: positive ETCO2 Dental Injury: Teeth and Oropharynx as per pre-operative assessment        

## 2018-01-19 NOTE — H&P (Signed)
[name] is an 62 y.o. male.   Chief Complaint: Patient continues to feel down and depressed.  Somewhat hopeless.  Week.  No active suicidal ideation.  No hallucinations. HPI: History of long-standing depression with worsening over the last several months to the point of near catatonia.  Past Medical History:  Diagnosis Date  . Depression     Past Surgical History:  Procedure Laterality Date  . HERNIA REPAIR      History reviewed. No pertinent family history. Social History:  reports that he has quit smoking. He quit smokeless tobacco use about a year ago. He reports that he has current or past drug history. His alcohol history is not on file.  Allergies: No Known Allergies  Medications Prior to Admission  Medication Sig Dispense Refill  . feeding supplement, ENSURE ENLIVE, (ENSURE ENLIVE) LIQD Take 237 mLs by mouth 2 (two) times daily between meals. 237 mL 12  . ibuprofen (ADVIL,MOTRIN) 600 MG tablet Take 1 tablet (600 mg total) by mouth every 6 (six) hours as needed for moderate pain. 30 tablet 0  . Multiple Vitamin (MULTIVITAMIN WITH MINERALS) TABS tablet Take 1 tablet by mouth daily. 30 tablet 0  . predniSONE (STERAPRED UNI-PAK 21 TAB) 10 MG (21) TBPK tablet Taper by 10 mg daily 21 tablet 0  . vitamin B-12 1000 MCG tablet Take 1 tablet (1,000 mcg total) by mouth daily. 30 tablet 0  . Vitamin D, Ergocalciferol, (DRISDOL) 50000 units CAPS capsule Take 1 capsule (50,000 Units total) by mouth every 7 (seven) days. 5 capsule 0    Results for orders placed or performed during the hospital encounter of 01/12/18 (from the past 48 hour(s))  Glucose, capillary     Status: None   Collection Time: 01/19/18  6:49 AM  Result Value Ref Range   Glucose-Capillary 89 70 - 99 mg/dL   Comment 1 Notify RN    No results found.  Review of Systems  Constitutional: Negative.   HENT: Negative.   Eyes: Negative.   Respiratory: Negative.   Cardiovascular: Negative.   Gastrointestinal:  Negative.   Musculoskeletal: Negative.   Skin: Negative.   Neurological: Positive for weakness.  Psychiatric/Behavioral: Positive for depression. Negative for hallucinations, memory loss, substance abuse and suicidal ideas. The patient is nervous/anxious. The patient does not have insomnia.     Blood pressure 103/73, pulse 69, temperature 98.6 F (37 C), temperature source Oral, resp. rate 18, height 5\' 10"  (1.778 m), weight 68.9 kg (152 lb), SpO2 98 %. Physical Exam  Nursing note and vitals reviewed. Constitutional: He appears well-developed.  HENT:  Head: Normocephalic and atraumatic.  Eyes: Pupils are equal, round, and reactive to light. Conjunctivae are normal.  Neck: Normal range of motion.  Cardiovascular: Regular rhythm and normal heart sounds.  Respiratory: Effort normal.  GI: Soft.  Musculoskeletal: Normal range of motion.  Neurological: He is alert. He displays atrophy.  Extremity weakness which is improving  Skin: Skin is warm and dry.  Psychiatric: Judgment normal. His affect is blunt. His speech is delayed. He is slowed. Cognition and memory are impaired. He expresses no homicidal and no suicidal ideation.     Assessment/Plan ECT today and continue 3 times a week schedule although Monday we will not be having treatment.  Patient is currently inpatient and will continue that way for now  Alethia Berthold, MD 01/19/2018, 8:46 AM

## 2018-01-19 NOTE — Anesthesia Preprocedure Evaluation (Signed)
Anesthesia Evaluation  Patient identified by MRN, date of birth, ID band Patient awake    Reviewed: Allergy & Precautions, NPO status , Patient's Chart, lab work & pertinent test results  History of Anesthesia Complications Negative for: history of anesthetic complications  Airway Mallampati: II       Dental   Pulmonary neg sleep apnea, neg COPD, former smoker,           Cardiovascular (-) hypertension(-) Past MI and (-) CHF (-) dysrhythmias (-) Valvular Problems/Murmurs     Neuro/Psych neg Seizures Depression    GI/Hepatic Neg liver ROS, neg GERD  ,  Endo/Other  neg diabetes  Renal/GU negative Renal ROS     Musculoskeletal   Abdominal   Peds  Hematology   Anesthesia Other Findings   Reproductive/Obstetrics                             Anesthesia Physical  Anesthesia Plan  ASA: II  Anesthesia Plan: General   Post-op Pain Management:    Induction: Intravenous  PONV Risk Score and Plan: 2 and TIVA  Airway Management Planned: Mask  Additional Equipment:   Intra-op Plan:   Post-operative Plan:   Informed Consent: I have reviewed the patients History and Physical, chart, labs and discussed the procedure including the risks, benefits and alternatives for the proposed anesthesia with the patient or authorized representative who has indicated his/her understanding and acceptance.     Plan Discussed with:   Anesthesia Plan Comments:         Anesthesia Quick Evaluation

## 2018-01-19 NOTE — Progress Notes (Addendum)
Received [name] this  AM in his room waiting for his ECT treatment. He was transported via wheelchair without incident with his sitter.  He returned to the unit afterwards and took a nap, therefore he ate lunch late. He eventually woke for dinner and his physical therapy treatment.He continued to endorse feeling depressed and some discomfort in his right groin area, stating he have a hernia.He is low upon movement, but encouraged to move as much as possible. He talks in a very low voice. His affect remains flat. The sitter remained at the bedside throughout the day.  0700 In his room asleep 0800 Transported to ECT 0900 Off the unit 1000 Off the unit  1100 Received him back from ECT 1130 Sleep in bed 1200 Sleep in his room 1300 Room awake 1400 Room awake 1500 Room awake 1600 Room awake 1700 In bed asleep 1800  In bed asleep 1830 In bed asleep

## 2018-01-19 NOTE — Anesthesia Preprocedure Evaluation (Deleted)
Anesthesia Evaluation  Patient identified by MRN, date of birth, ID band Patient awake    Reviewed: Allergy & Precautions, NPO status , Patient's Chart, lab work & pertinent test results  History of Anesthesia Complications Negative for: history of anesthetic complications  Airway Mallampati: II       Dental  (+) Dental Advidsory Given   Pulmonary neg sleep apnea, neg COPD, former smoker,           Cardiovascular (-) hypertension(-) Past MI and (-) CHF negative cardio ROS  (-) dysrhythmias (-) Valvular Problems/Murmurs     Neuro/Psych neg Seizures PSYCHIATRIC DISORDERS Depression    GI/Hepatic Neg liver ROS, neg GERD  ,  Endo/Other  neg diabetes  Renal/GU negative Renal ROS     Musculoskeletal negative musculoskeletal ROS (+)   Abdominal   Peds negative pediatric ROS (+)  Hematology   Anesthesia Other Findings Past Medical History: No date: Depression   Reproductive/Obstetrics                             Anesthesia Physical  Anesthesia Plan  ASA: II  Anesthesia Plan: General   Post-op Pain Management:    Induction: Intravenous  PONV Risk Score and Plan: 2 and TIVA  Airway Management Planned: Mask  Additional Equipment:   Intra-op Plan:   Post-operative Plan:   Informed Consent: I have reviewed the patients History and Physical, chart, labs and discussed the procedure including the risks, benefits and alternatives for the proposed anesthesia with the patient or authorized representative who has indicated his/her understanding and acceptance.     Plan Discussed with:   Anesthesia Plan Comments:         Anesthesia Quick Evaluation                                  Anesthesia Evaluation  Patient identified by MRN, date of birth, ID band Patient awake    Reviewed: Allergy & Precautions, NPO status , Patient's Chart, lab work & pertinent test results  History  of Anesthesia Complications Negative for: history of anesthetic complications  Airway Mallampati: II       Dental   Pulmonary neg sleep apnea, neg COPD, former smoker,           Cardiovascular (-) hypertension(-) Past MI and (-) CHF (-) dysrhythmias (-) Valvular Problems/Murmurs     Neuro/Psych neg Seizures Depression    GI/Hepatic Neg liver ROS, neg GERD  ,  Endo/Other  neg diabetes  Renal/GU negative Renal ROS     Musculoskeletal   Abdominal   Peds  Hematology   Anesthesia Other Findings   Reproductive/Obstetrics                            Anesthesia Physical Anesthesia Plan  ASA: II  Anesthesia Plan: General   Post-op Pain Management:    Induction: Intravenous  PONV Risk Score and Plan: 2 and TIVA  Airway Management Planned: Mask  Additional Equipment:   Intra-op Plan:   Post-operative Plan:   Informed Consent: I have reviewed the patients History and Physical, chart, labs and discussed the procedure including the risks, benefits and alternatives for the proposed anesthesia with the patient or authorized representative who has indicated his/her understanding and acceptance.     Plan Discussed with:   Anesthesia Plan Comments:  Anesthesia Quick Evaluation                                   Anesthesia Evaluation  Patient identified by MRN, date of birth, ID band Patient awake    Reviewed: Allergy & Precautions, NPO status , Patient's Chart, lab work & pertinent test results  History of Anesthesia Complications Negative for: history of anesthetic complications  Airway Mallampati: II       Dental   Pulmonary neg sleep apnea, neg COPD, former smoker,           Cardiovascular (-) hypertension(-) Past MI and (-) CHF (-) dysrhythmias (-) Valvular Problems/Murmurs     Neuro/Psych neg Seizures Depression    GI/Hepatic Neg liver ROS, neg GERD  ,  Endo/Other  neg diabetes   Renal/GU negative Renal ROS     Musculoskeletal   Abdominal   Peds  Hematology   Anesthesia Other Findings   Reproductive/Obstetrics                            Anesthesia Physical Anesthesia Plan  ASA: II  Anesthesia Plan: General   Post-op Pain Management:    Induction: Intravenous  PONV Risk Score and Plan: 2 and TIVA  Airway Management Planned: Mask  Additional Equipment:   Intra-op Plan:   Post-operative Plan:   Informed Consent: I have reviewed the patients History and Physical, chart, labs and discussed the procedure including the risks, benefits and alternatives for the proposed anesthesia with the patient or authorized representative who has indicated his/her understanding and acceptance.     Plan Discussed with:   Anesthesia Plan Comments:         Anesthesia Quick Evaluation

## 2018-01-19 NOTE — Transfer of Care (Signed)
Immediate Anesthesia Transfer of Care Note  Patient: [name]  Procedure(s) Performed: ECT TX  Patient Location: PACU  Anesthesia Type:General  Level of Consciousness: sedated  Airway & Oxygen Therapy: Patient Spontanous Breathing and Patient connected to face mask oxygen  Post-op Assessment: Report given to RN and Post -op Vital signs reviewed and stable  Post vital signs: Reviewed and stable  Last Vitals:  Vitals Value Taken Time  BP    Temp    Pulse 65 01/19/2018  9:53 AM  Resp 17 01/19/2018  9:53 AM  SpO2 100 % 01/19/2018  9:53 AM  Vitals shown include unvalidated device data.  Last Pain:  Vitals:   01/19/18 0952  TempSrc:   PainSc: (P) Asleep      Patients Stated Pain Goal: 0 (04/59/97 7414)  Complications: No apparent anesthesia complications

## 2018-01-19 NOTE — Plan of Care (Signed)
Pt calm and cooperative this evening. Pt out in the dayroom most of evening until another patient got Irate so he was a little scared but was able to calm down. Pt had his snack and medications, then helped in bed. Pt voided once. Pt's one to one discontinued for tonight and to be renewed in the morning. Pt denies SI/HI. Pt is receptive to treatment and safety maintained on unit. Will continue to monitor. Problem: Activity: Goal: Interest or engagement in activities will improve Outcome: Progressing Goal: Sleeping patterns will improve Outcome: Progressing   Problem: Coping: Goal: Ability to verbalize frustrations and anger appropriately will improve Outcome: Progressing   Problem: Safety: Goal: Periods of time without injury will increase Outcome: Progressing   Problem: Education: Goal: Utilization of techniques to improve thought processes will improve Outcome: Progressing   Problem: Coping: Goal: Will verbalize feelings Outcome: Progressing   Problem: Safety: Goal: Ability to disclose and discuss suicidal ideas will improve Outcome: Progressing   Problem: Activity: Goal: Interest or engagement in activities will improve Outcome: Progressing Goal: Sleeping patterns will improve Outcome: Progressing   Problem: Coping: Goal: Ability to verbalize frustrations and anger appropriately will improve Outcome: Progressing   Problem: Safety: Goal: Periods of time without injury will increase Outcome: Progressing   Problem: Education: Goal: Utilization of techniques to improve thought processes will improve Outcome: Progressing   Problem: Coping: Goal: Will verbalize feelings Outcome: Progressing   Problem: Safety: Goal: Ability to disclose and discuss suicidal ideas will improve Outcome: Progressing

## 2018-01-19 NOTE — [facility] Group Notes (Signed)

## 2018-01-19 NOTE — Tx Team (Signed)
Interdisciplinary Treatment and Diagnostic Plan Update  01/19/2018 Time of Session: reviewed by phone with Physician Kalib Bhagat Takagi MRN: [id number]  Principal Diagnosis: Severe recurrent major depression without psychotic features ([place])  Secondary Diagnoses: Principal Problem:   Severe recurrent major depression without psychotic features ([place]) Active Problems:   Catatonia   Current Medications:  Current Facility-Administered Medications  Medication Dose Route Frequency Provider Last Rate Last Dose  . acetaminophen (TYLENOL) tablet 650 mg  650 mg Oral Q6H PRN Clapacs, Madie [place], MD   650 mg at 01/14/18 2303  . alum & mag hydroxide-simeth (MAALOX/MYLANTA) 200-200-20 MG/5ML suspension 30 mL  30 mL Oral Q4H PRN Clapacs, John T, MD      . feeding supplement (ENSURE ENLIVE) (ENSURE ENLIVE) liquid 237 mL  237 mL Oral BID BM Clapacs, John T, MD   237 mL at 01/18/18 1744  . fentaNYL (SUBLIMAZE) injection 25 mcg  25 mcg Intravenous Q5 min PRN Alvin Critchley, MD      . Gerhardt's butt cream   Topical BID Clapacs, Madie Reno, MD      . ibuprofen (ADVIL,MOTRIN) tablet 400 mg  400 mg Oral Q6H PRN Clapacs, Madie Reno, MD   400 mg at 01/17/18 2055  . ketorolac (TORADOL) 30 MG/ML injection 15 mg  15 mg Intravenous Once Clapacs, John T, MD      . magnesium hydroxide (MILK OF MAGNESIA) suspension 30 mL  30 mL Oral Daily PRN Clapacs, Madie Reno, MD   30 mL at 01/16/18 0852  . mirtazapine (REMERON) tablet 45 mg  45 mg Oral QHS Clapacs, Madie Reno, MD   45 mg at 01/18/18 2118  . multivitamin with minerals tablet 1 tablet  1 tablet Oral Daily Clapacs, Madie Reno, MD   1 tablet at 01/18/18 1028  . ondansetron (ZOFRAN) injection 4 mg  4 mg Intravenous Once PRN Alvin Critchley, MD      . pantoprazole (PROTONIX) EC tablet 40 mg  40 mg Oral Daily Clapacs, Madie Reno, MD   40 mg at 01/18/18 1027  . traZODone (DESYREL) tablet 100 mg  100 mg Oral QHS PRN Clapacs, Madie Reno, MD   100 mg at 01/17/18 2055   PTA Medications: Medications Prior to  Admission  Medication Sig Dispense Refill Last Dose  . feeding supplement, ENSURE ENLIVE, (ENSURE ENLIVE) LIQD Take 237 mLs by mouth 2 (two) times daily between meals. 237 mL 12 01/14/2018  . ibuprofen (ADVIL,MOTRIN) 600 MG tablet Take 1 tablet (600 mg total) by mouth every 6 (six) hours as needed for moderate pain. 30 tablet 0 01/14/2018  . Multiple Vitamin (MULTIVITAMIN WITH MINERALS) TABS tablet Take 1 tablet by mouth daily. 30 tablet 0 01/14/2018  . predniSONE (STERAPRED UNI-PAK 21 TAB) 10 MG (21) TBPK tablet Taper by 10 mg daily 21 tablet 0 01/14/2018  . vitamin B-12 1000 MCG tablet Take 1 tablet (1,000 mcg total) by mouth daily. 30 tablet 0 01/14/2018  . Vitamin D, Ergocalciferol, (DRISDOL) 50000 units CAPS capsule Take 1 capsule (50,000 Units total) by mouth every 7 (seven) days. 5 capsule 0 01/14/2018    Patient Stressors:    Patient Strengths:    Treatment Modalities: Medication Management, Group therapy, Case management,  1 to 1 session with clinician, Psychoeducation, Recreational therapy.   Physician Treatment Plan for Primary Diagnosis: Severe recurrent major depression without psychotic features ([place]) Long Term Goal(s): Improvement in symptoms so as ready for discharge Improvement in symptoms so as ready for discharge   Short Term Goals: Ability  to demonstrate self-control will improve Ability to identify and develop effective coping behaviors will improve Ability to maintain clinical measurements within normal limits will improve Compliance with prescribed medications will improve  Medication Management: Evaluate patient's response, side effects, and tolerance of medication regimen.  Therapeutic Interventions: 1 to 1 sessions, Unit Group sessions and Medication administration.  Evaluation of Outcomes: Progressing  Physician Treatment Plan for Secondary Diagnosis: Principal Problem:   Severe recurrent major depression without psychotic features ([place]) Active Problems:    Catatonia  Long Term Goal(s): Improvement in symptoms so as ready for discharge Improvement in symptoms so as ready for discharge   Short Term Goals: Ability to demonstrate self-control will improve Ability to identify and develop effective coping behaviors will improve Ability to maintain clinical measurements within normal limits will improve Compliance with prescribed medications will improve     Medication Management: Evaluate patient's response, side effects, and tolerance of medication regimen.  Therapeutic Interventions: 1 to 1 sessions, Unit Group sessions and Medication administration.  Evaluation of Outcomes: Progressing   RN Treatment Plan for Primary Diagnosis: Severe recurrent major depression without psychotic features ([place]) Long Term Goal(s): Knowledge of disease and therapeutic regimen to maintain health will improve  Short Term Goals: Ability to identify and develop effective coping behaviors will improve and Compliance with prescribed medications will improve  Medication Management: RN will administer medications as ordered by provider, will assess and evaluate patient's response and provide education to patient for prescribed medication. RN will report any adverse and/or side effects to prescribing provider.  Therapeutic Interventions: 1 on 1 counseling sessions, Psychoeducation, Medication administration, Evaluate responses to treatment, Monitor vital signs and CBGs as ordered, Perform/monitor CIWA, COWS, AIMS and Fall Risk screenings as ordered, Perform wound care treatments as ordered.  Evaluation of Outcomes: Progressing   LCSW Treatment Plan for Primary Diagnosis: Severe recurrent major depression without psychotic features ([place]) Long Term Goal(s): Safe transition to appropriate next level of care at discharge, Engage patient in therapeutic group addressing interpersonal concerns.  Short Term Goals: Engage patient in aftercare planning with referrals and  resources, Identify triggers associated with mental health/substance abuse issues and Increase skills for wellness and recovery  Therapeutic Interventions: Assess for all discharge needs, 1 to 1 time with Social worker, Explore available resources and support systems, Assess for adequacy in community support network, Educate family and significant other(s) on suicide prevention, Complete Psychosocial Assessment, Interpersonal group therapy.  Evaluation of Outcomes: Progressing   Progress in Treatment: Attending groups: No. Participating in groups: No. Taking medication as prescribed: Yes. Toleration medication: Yes. Family/Significant other contact made: Yes, individual(s) contacted:  grandson [place] Patient understands diagnosis: Yes. Discussing patient identified problems/goals with staff: Yes. Medical problems stabilized or resolved: Yes. Denies suicidal/homicidal ideation: No. Issues/concerns per patient self-inventory: No. Other:    New problem(s) identified: No, Describe:     New Short Term/Long Term Goal(s):  Patient Goals:  To feel better, have more energy to take car of himself more, be more active again  Discharge Plan or Barriers: ECT treatment in hosptial, possibility of outpatient ECT follow up TBD.  Discharge home to family with outpatient follow up medication managementTBD   Reason for Continuation of Hospitalization: Anxiety Depression Medication stabilization Suicidal ideation  Estimated Length of Stay: 7-10 days  Recreational Therapy: Patient Stressors: N/A Patient Goal: Patient will engage in groups without prompting or encouragement from LRT x3 group sessions within 5 recreation therapy group sessions  Attendees: Patient:[name] 01/19/2018 10:59 AM  Physician: Dr.  Clapacs 01/19/2018 10:59 AM  Nursing:  01/19/2018 10:59 AM  RN Care Manager: 01/19/2018 10:59 AM  Social Worker: Dossie Arbour, LCSW 01/19/2018 10:59 AM  Recreational Therapist:  01/19/2018 10:59  AM  Other:  01/19/2018 10:59 AM  Other:  01/19/2018 10:59 AM  Other: 01/19/2018 10:59 AM    Scribe for Treatment Team: Alden Hipp, LCSW 01/19/2018 10:59 AM

## 2018-01-19 NOTE — Progress Notes (Signed)
[facility] MD Progress Note  01/20/2018 3:21 PM [name]  MRN:  [id number]  Subjective:    [name] feels "good" but looks very depressed with severe psychomotor retardation, moving slowly with a walker. He reports good sleep and appetite but depressed mood. He tolerates medications well. No somatic complaints. Din not notice any improvement with three ECT sessions.   Principal Problem: Severe recurrent major depression without psychotic features ([place]) Diagnosis:   Patient Active Problem List   Diagnosis Date Noted  . Severe recurrent major depression without psychotic features ([place]) [F33.2] 01/09/2018    Priority: High  . Catatonia [F06.1] 01/12/2018  . Malnutrition of moderate degree [E44.0] 01/09/2018  . Pressure injury of skin [L89.90] 01/09/2018  . Back pain [M54.9] 01/08/2018  . Shuffling gait [R26.89] 01/08/2018  . Depression [F32.9] 01/08/2018   Total Time spent with patient: 20 minutes  Past Psychiatric History: depression  Past Medical History:  Past Medical History:  Diagnosis Date  . Depression     Past Surgical History:  Procedure Laterality Date  . HERNIA REPAIR     Family History: History reviewed. No pertinent family history. Family Psychiatric  History: none Social History:  Social History   Substance and Sexual Activity  Alcohol Use Not on file     Social History   Substance and Sexual Activity  Drug Use Not Currently    Social History   Socioeconomic History  . Marital status: Married    Spouse name: Not on file  . Number of children: Not on file  . Years of education: Not on file  . Highest education level: Not on file  Occupational History  . Not on file  Social Needs  . Financial resource strain: Not on file  . Food insecurity:    Worry: Not on file    Inability: Not on file  . Transportation needs:    Medical: Not on file    Non-medical: Not on file  Tobacco Use  . Smoking status: Former Research scientist (life sciences)  . Smokeless tobacco: Former  Systems developer    Quit date: 01/10/2017  Substance and Sexual Activity  . Alcohol use: Not on file  . Drug use: Not Currently  . Sexual activity: Not Currently  Lifestyle  . Physical activity:    Days per week: Not on file    Minutes per session: Not on file  . Stress: Not on file  Relationships  . Social connections:    Talks on phone: Not on file    Gets together: Not on file    Attends religious service: Not on file    Active member of club or organization: Not on file    Attends meetings of clubs or organizations: Not on file    Relationship status: Not on file  Other Topics Concern  . Not on file  Social History Narrative  . Not on file   Additional Social History:                         Sleep: Fair  Appetite:  Fair  Current Medications: Current Facility-Administered Medications  Medication Dose Route Frequency Provider Last Rate Last Dose  . acetaminophen (TYLENOL) tablet 650 mg  650 mg Oral Q6H PRN Clapacs, Madie Reno, MD   650 mg at 01/14/18 2303  . alum & mag hydroxide-simeth (MAALOX/MYLANTA) 200-200-20 MG/5ML suspension 30 mL  30 mL Oral Q4H PRN Clapacs, Madie Reno, MD      . feeding supplement (ENSURE  ENLIVE) (ENSURE ENLIVE) liquid 237 mL  237 mL Oral BID BM Clapacs, John T, MD   237 mL at 01/19/18 1400  . fentaNYL (SUBLIMAZE) injection 25 mcg  25 mcg Intravenous Q5 min PRN Alvin Critchley, MD      . Gerhardt's butt cream   Topical BID Clapacs, Madie Reno, MD      . ibuprofen (ADVIL,MOTRIN) tablet 400 mg  400 mg Oral Q6H PRN Clapacs, Madie Reno, MD   400 mg at 01/17/18 2055  . ketorolac (TORADOL) 30 MG/ML injection 15 mg  15 mg Intravenous Once Clapacs, John T, MD      . magnesium hydroxide (MILK OF MAGNESIA) suspension 30 mL  30 mL Oral Daily PRN Clapacs, Madie Reno, MD   30 mL at 01/16/18 0852  . mirtazapine (REMERON) tablet 45 mg  45 mg Oral QHS Clapacs, Madie Reno, MD   45 mg at 01/19/18 2116  . multivitamin with minerals tablet 1 tablet  1 tablet Oral Daily Clapacs, Madie Reno, MD   1  tablet at 01/20/18 0933  . ondansetron (ZOFRAN) injection 4 mg  4 mg Intravenous Once PRN Alvin Critchley, MD      . pantoprazole (PROTONIX) EC tablet 40 mg  40 mg Oral Daily Clapacs, Madie Reno, MD   40 mg at 01/20/18 0933  . traZODone (DESYREL) tablet 100 mg  100 mg Oral QHS PRN Clapacs, Madie Reno, MD   100 mg at 01/17/18 2055    Lab Results:  Results for orders placed or performed during the hospital encounter of 01/12/18 (from the past 48 hour(s))  Glucose, capillary     Status: None   Collection Time: 01/19/18  6:49 AM  Result Value Ref Range   Glucose-Capillary 89 70 - 99 mg/dL   Comment 1 Notify RN     Blood Alcohol level:  Lab Results  Component Value Date   ETH <10 46/96/2952    Metabolic Disorder Labs: No results found for: HGBA1C, MPG No results found for: PROLACTIN No results found for: CHOL, TRIG, HDL, CHOLHDL, VLDL, LDLCALC  Physical Findings: AIMS:  , ,  ,  ,    CIWA:    COWS:     Musculoskeletal: Strength & Muscle Tone: decreased Gait & Station: unsteady Patient leans: N/A  Psychiatric Specialty Exam: Physical Exam  Nursing note and vitals reviewed. Psychiatric: Judgment and thought content normal. His affect is blunt. His speech is delayed. He is slowed and withdrawn. Cognition and memory are impaired.    Review of Systems  Neurological: Positive for weakness.  Psychiatric/Behavioral: Positive for depression.  All other systems reviewed and are negative.   Blood pressure 126/79, pulse 68, temperature 98.2 F (36.8 C), temperature source Oral, resp. rate 16, height 5\' 10"  (1.778 m), weight 68.9 kg (152 lb), SpO2 98 %.Body mass index is 21.81 kg/m.  General Appearance: Casual  Eye Contact:  Good  Speech:  Slow  Volume:  Decreased  Mood:  Depressed  Affect:  Flat  Thought Process:  Goal Directed and Descriptions of Associations: Intact  Orientation:  Full (Time, Place, and Person)  Thought Content:  WDL  Suicidal Thoughts:  No  Homicidal Thoughts:  No   Memory:  Immediate;   Fair Recent;   Fair Remote;   Fair  Judgement:  Fair  Insight:  Fair  Psychomotor Activity:  Psychomotor Retardation  Concentration:  Concentration: Fair and Attention Span: Fair  Recall:  [employer] of Knowledge:  Fair  Language:  Fair  Akathisia:  No  Handed:  Right  AIMS (if indicated):     Assets:  Communication Skills Desire for Improvement Financial Resources/Insurance Housing Physical Health Resilience Social Support  ADL's:  Intact  Cognition:  WNL  Sleep:  Number of Hours: 5.45     Treatment Plan Summary: Daily contact with patient to assess and evaluate symptoms and progress in treatment and Medication management   Mr. Feider is a 62 year old male with a history of treatment resistant depression receiving ECT.   #Mood, not improving -continue Remeron 15 mg  nightly -continue Trazodone 100 mg nightly -ECT on Wed   #GERD -Protonix 40 mg daily  #Fall risk -uses a walker -1:1 siter  #Labs completed  #Disposition -discharge to home with grandson -follow up with regular psychiatrist   Orson Slick, MD 01/20/2018, 3:21 PM

## 2018-01-19 NOTE — Progress Notes (Signed)
Physical Therapy Treatment Patient Details Name: [name] MRN: [id number] DOB: 1955-11-25 Today's Date: 01/19/2018    History of Present Illness Per grandson (who pt lives with) he has been unmotivated to do anything over the last several months and has hardly done much more than get out of bed over the last several weeks. Pt diagnosed with severe recurrent depression without psychiatric features. Pt recently transfered to behavior med. New PT eval completed on 01/12/18. Pt began ECT treatments 3x/week beginning 01/12/18    PT Comments    Pt seen this afternoon and states that he feels "melancholy." Upon arrival pt recently back in bed after assistance to bathroom from nurse aide. Pt very fatigued this date after ECT treatments this morning. Pt limited this session due to fatigue. Pt tolerated treatment well with only minor pain in R groin. Pt amb 50' with RW, CGA, and chair follow. Pt still demonstrates flat affect as well as generally depressed mood and emotions. Pt demonstrates much better physical function on days he does not have ECT treatment. Pt could benefit from continued skilled therapy at this time to improve deficits most notably endurance toward PLOF. PT will continue to work with pt at least 2x/week while admitted. D/c recommendations continue to be HHPT.    Follow Up Recommendations  Home health PT     Equipment Recommendations  Rolling walker with 5" wheels    Recommendations for Other Services       Precautions / Restrictions Precautions Precautions: Fall Restrictions Weight Bearing Restrictions: No    Mobility  Bed Mobility Overal bed mobility: Needs Assistance Bed Mobility: Supine to Sit;Sit to Supine     Supine to sit: Min assist Sit to supine: Min assist;Mod assist   General bed mobility comments: pt min mod assist supine<>sit with physical assistance mainly for R LE due to increased pain in groin. pt able to maintain sitting independently without UE  support  Transfers Overall transfer level: Needs assistance Equipment used: Rolling walker (2 wheeled) Transfers: Sit to/from Stand Sit to Stand: Min guard         General transfer comment: pt CGA sit<>stand demonstrating proper technique and safe use of RW. Pt demonstrates proper use of UEs during transfer. able to maintain upright posture with supervision without UE support  Ambulation/Gait Ambulation/Gait assistance: Min guard Gait Distance (Feet): 50 Feet Assistive device: Rolling walker (2 wheeled)       General Gait Details: pt amb 21' with CGA, RW, and chair follow. Pt demonstreates slow shuffling gait pattern with flexed trunk. Pt uses RW appropriately however ambulates with narrow base of support. No gross LOB during amb. Pt fatigued easily.   Stairs             Wheelchair Mobility    Modified Rankin (Stroke Patients Only)       Balance Overall balance assessment: Needs assistance   Sitting balance-Leahy Scale: Good Sitting balance - Comments: able to sit with CGA EOB without UE support for long period of time.     Standing balance-Leahy Scale: Good Standing balance comment: demonstrates ability to stand without UE support reaching outside BOS for balance exercises without gross LOB                            Cognition Arousal/Alertness: Awake/alert Behavior During Therapy: Flat affect Overall Cognitive Status: Difficult to assess  General Comments: Pt with low energy, flat affect, soft spoken, appearing depressed demeanor remained same throughout      Exercises Other Exercises Other Exercises: pt instructed in LE ther-ex which he tolerates well with only complaint being of increased pain in R groin with activity. Pt instructed in x10 bilateral SLR, seated marching, standing marching, LAQ; as well as standing balance exercise while reaching outside BOS with CGA x 10 each direction     General Comments        Pertinent Vitals/Pain Pain Assessment: (unable to rate pain complains of pain in R groin/low back)    Home Living                      Prior Function            PT Goals (current goals can now be found in the care plan section) Acute Rehab PT Goals Patient Stated Goal: pt unable to state. PT Goal Formulation: With patient Time For Goal Achievement: 01/26/18 Potential to Achieve Goals: Fair Progress towards PT goals: Progressing toward goals    Frequency    Min 2X/week      PT Plan Current plan remains appropriate    Co-evaluation              AM-PAC PT "6 Clicks" Daily Activity  Outcome Measure  Difficulty turning over in bed (including adjusting bedclothes, sheets and blankets)?: A Little Difficulty moving from lying on back to sitting on the side of the bed? : Unable Difficulty sitting down on and standing up from a chair with arms (e.g., wheelchair, bedside commode, etc,.)?: Unable Help needed moving to and from a bed to chair (including a wheelchair)?: A Little Help needed walking in hospital room?: A Little Help needed climbing 3-5 steps with a railing? : A Lot 6 Click Score: 13    End of Session Equipment Utilized During Treatment: Gait belt Activity Tolerance: Patient tolerated treatment well Patient left: with nursing/sitter in room;with bed alarm set;in bed   PT Visit Diagnosis: Muscle weakness (generalized) (M62.81);Difficulty in walking, not elsewhere classified (R26.2);Unsteadiness on feet (R26.81);Other abnormalities of gait and mobility (R26.89)     Time: 4034-7425 PT Time Calculation (min) (ACUTE ONLY): 24 min  Charges:                       G Codes:       [name] [name], SPT    [name] 01/19/2018, 4:49 PM

## 2018-01-20 NOTE — Plan of Care (Signed)
  Problem: Activity: Goal: Interest or engagement in activities will improve Outcome: Progressing Goal: Sleeping patterns will improve Outcome: Progressing   Problem: Coping: Goal: Ability to verbalize frustrations and anger appropriately will improve Outcome: Progressing   Problem: Safety: Goal: Periods of time without injury will increase Outcome: Progressing   Problem: Education: Goal: Utilization of techniques to improve thought processes will improve Outcome: Progressing   Problem: Activity: Goal: Interest or engagement in leisure activities will improve Outcome: Progressing   Problem: Coping: Goal: Will verbalize feelings Outcome: Progressing   Problem: Safety: Goal: Ability to disclose and discuss suicidal ideas will improve Outcome: Progressing   Problem: Education: Goal: Knowledge of General Education information will improve Outcome: Progressing

## 2018-01-20 NOTE — Progress Notes (Signed)
Hourly rounding: 0800: eating breakfast.1:1 for safety 0900:  In room. Sitter assisting with shaving 1000: Taking a shower;  1:1 maintained 1100 Brushing teeth.  1:1 maintained 1200: Eating lunch in community room.  1:1 maintained 1300: Psychotherapy group.   1:1 maintained 1400 In room in recliner.  1:1 maintained 1500  In bathroom.  1;1 maintained 1600 In dayroom watching tv.  1:1 maintined 1700 In dayroom watching tv.  1:1 maintined 1800 Talking with visitors.  1:1 maintained 43 Talking with visitors.  1:1 maintained

## 2018-01-20 NOTE — Progress Notes (Signed)
[facility] MD Progress Note  01/20/2018 1:09 AM [name]  MRN:  [id number] Subjective: Follow-up note for 62 year old man with severe depression receiving ECT.  ECT treatment completed this morning without incident.  Patient is still reporting that he feels down and melancholy.  Energy level still appears to be low.  Patient is cooperating better with physical activity and eating.  Not reporting suicidal ideation.  Only minor cognitive problems from the ECT.  No other physical complaints. Principal Problem: Severe recurrent major depression without psychotic features ([place]) Diagnosis:   Patient Active Problem List   Diagnosis Date Noted  . Catatonia [F06.1] 01/12/2018  . Malnutrition of moderate degree [E44.0] 01/09/2018  . Pressure injury of skin [L89.90] 01/09/2018  . Severe recurrent major depression without psychotic features ([place]) [F33.2] 01/09/2018  . Back pain [M54.9] 01/08/2018  . Shuffling gait [R26.89] 01/08/2018  . Depression [F32.9] 01/08/2018   Total Time spent with patient: 30 minutes  Past Psychiatric History: History of long-standing depression without response to typical treatment  Past Medical History:  Past Medical History:  Diagnosis Date  . Depression     Past Surgical History:  Procedure Laterality Date  . HERNIA REPAIR     Family History: History reviewed. No pertinent family history. Family Psychiatric  History: None Social History:  Social History   Substance and Sexual Activity  Alcohol Use Not on file     Social History   Substance and Sexual Activity  Drug Use Not Currently    Social History   Socioeconomic History  . Marital status: Married    Spouse name: Not on file  . Number of children: Not on file  . Years of education: Not on file  . Highest education level: Not on file  Occupational History  . Not on file  Social Needs  . Financial resource strain: Not on file  . Food insecurity:    Worry: Not on file    Inability: Not on file   . Transportation needs:    Medical: Not on file    Non-medical: Not on file  Tobacco Use  . Smoking status: Former Research scientist (life sciences)  . Smokeless tobacco: Former Systems developer    Quit date: 01/10/2017  Substance and Sexual Activity  . Alcohol use: Not on file  . Drug use: Not Currently  . Sexual activity: Not Currently  Lifestyle  . Physical activity:    Days per week: Not on file    Minutes per session: Not on file  . Stress: Not on file  Relationships  . Social connections:    Talks on phone: Not on file    Gets together: Not on file    Attends religious service: Not on file    Active member of club or organization: Not on file    Attends meetings of clubs or organizations: Not on file    Relationship status: Not on file  Other Topics Concern  . Not on file  Social History Narrative  . Not on file   Additional Social History:                         Sleep: Fair  Appetite:  Fair  Current Medications: Current Facility-Administered Medications  Medication Dose Route Frequency Provider Last Rate Last Dose  . acetaminophen (TYLENOL) tablet 650 mg  650 mg Oral Q6H PRN Clapacs, Madie Reno, MD   650 mg at 01/14/18 2303  . alum & mag hydroxide-simeth (MAALOX/MYLANTA) 200-200-20 MG/5ML suspension 30  mL  30 mL Oral Q4H PRN Clapacs, John T, MD      . feeding supplement (ENSURE ENLIVE) (ENSURE ENLIVE) liquid 237 mL  237 mL Oral BID BM Clapacs, John T, MD   237 mL at 01/19/18 1400  . fentaNYL (SUBLIMAZE) injection 25 mcg  25 mcg Intravenous Q5 min PRN Alvin Critchley, MD      . Gerhardt's butt cream   Topical BID Clapacs, Madie Reno, MD      . ibuprofen (ADVIL,MOTRIN) tablet 400 mg  400 mg Oral Q6H PRN Clapacs, Madie Reno, MD   400 mg at 01/17/18 2055  . ketorolac (TORADOL) 30 MG/ML injection 15 mg  15 mg Intravenous Once Clapacs, John T, MD      . magnesium hydroxide (MILK OF MAGNESIA) suspension 30 mL  30 mL Oral Daily PRN Clapacs, Madie Reno, MD   30 mL at 01/16/18 0852  . mirtazapine (REMERON) tablet 45  mg  45 mg Oral QHS Clapacs, Madie Reno, MD   45 mg at 01/19/18 2116  . multivitamin with minerals tablet 1 tablet  1 tablet Oral Daily Clapacs, Madie Reno, MD   1 tablet at 01/19/18 1650  . ondansetron (ZOFRAN) injection 4 mg  4 mg Intravenous Once PRN Alvin Critchley, MD      . pantoprazole (PROTONIX) EC tablet 40 mg  40 mg Oral Daily Clapacs, Madie Reno, MD   40 mg at 01/19/18 1650  . traZODone (DESYREL) tablet 100 mg  100 mg Oral QHS PRN Clapacs, Madie Reno, MD   100 mg at 01/17/18 2055    Lab Results:  Results for orders placed or performed during the hospital encounter of 01/12/18 (from the past 48 hour(s))  Glucose, capillary     Status: None   Collection Time: 01/19/18  6:49 AM  Result Value Ref Range   Glucose-Capillary 89 70 - 99 mg/dL   Comment 1 Notify RN     Blood Alcohol level:  Lab Results  Component Value Date   ETH <10 52/84/1324    Metabolic Disorder Labs: No results found for: HGBA1C, MPG No results found for: PROLACTIN No results found for: CHOL, TRIG, HDL, CHOLHDL, VLDL, LDLCALC  Physical Findings: AIMS:  , ,  ,  ,    CIWA:    COWS:     Musculoskeletal: Strength & Muscle Tone: decreased Gait & Station: unsteady Patient leans: N/A  Psychiatric Specialty Exam: Physical Exam  Nursing note and vitals reviewed. Constitutional: He appears well-developed.  HENT:  Head: Normocephalic and atraumatic.  Eyes: Pupils are equal, round, and reactive to light. Conjunctivae are normal.  Neck: Normal range of motion.  Cardiovascular: Normal heart sounds.  Respiratory: Effort normal.  GI: Soft.  Musculoskeletal: Normal range of motion.  Neurological: He is alert.  Skin: Skin is warm and dry.  Psychiatric: Judgment normal. His affect is blunt. His speech is delayed. He is slowed. Thought content is not paranoid. He expresses no homicidal and no suicidal ideation. He exhibits abnormal recent memory.    Review of Systems  Constitutional: Negative.   HENT: Negative.   Eyes:  Negative.   Respiratory: Negative.   Cardiovascular: Negative.   Gastrointestinal: Negative.   Musculoskeletal: Negative.   Skin: Negative.   Neurological: Positive for weakness.  Psychiatric/Behavioral: Positive for depression. Negative for hallucinations and substance abuse. The patient is not nervous/anxious and does not have insomnia.     Blood pressure 137/80, pulse 72, temperature 98.7 F (37.1 C), resp. rate 12, height 5'  10" (1.778 m), weight 68.9 kg (152 lb), SpO2 98 %.Body mass index is 21.81 kg/m.  General Appearance: Casual  Eye Contact:  Minimal  Speech:  Slow  Volume:  Decreased  Mood:  Depressed  Affect:  Congruent  Thought Process:  Coherent  Orientation:  Full (Time, Place, and Person)  Thought Content:  Logical  Suicidal Thoughts:  No  Homicidal Thoughts:  No  Memory:  Immediate;   Fair Recent;   Fair Remote;   Fair  Judgement:  Fair  Insight:  Fair  Psychomotor Activity:  Decreased  Concentration:  Concentration: Fair  Recall:  [employer] of Knowledge:  Fair  Language:  Fair  Akathisia:  No  Handed:  Right  AIMS (if indicated):     Assets:  Desire for Improvement Housing Social Support  ADL's:  Impaired  Cognition:  Impaired,  Mild  Sleep:  Number of Hours: 6.3     Treatment Plan Summary: Daily contact with patient to assess and evaluate symptoms and progress in treatment, Medication management and Plan Patient will continue with bilateral ECT.  Next treatment will be Wednesday due to scheduling circumstances.  Continue current 45 mg at night of mirtazapine and inclusion in groups and activity.  Case reviewed with treatment team.  Patient remains too debilitated to be managed as an outpatient  Alethia Berthold, MD 01/20/2018, 1:09 AM

## 2018-01-20 NOTE — Progress Notes (Signed)
[facility] MD Progress Note  01/20/2018 10:24 PM YIFAN AUKER  MRN:  [id number]  Subjective:    [name] is still depressed with severe psychomotor retardation, difficulties walking, poor appetite and a sitter. When asked what bothers him, he answered "just life itself". There are several unopened bottles of Ensure by his bed.   Principal Problem: Severe recurrent major depression without psychotic features ([place]) Diagnosis:   Patient Active Problem List   Diagnosis Date Noted  . Severe recurrent major depression without psychotic features ([place]) [F33.2] 01/09/2018    Priority: High  . Catatonia [F06.1] 01/12/2018  . Malnutrition of moderate degree [E44.0] 01/09/2018  . Pressure injury of skin [L89.90] 01/09/2018  . Back pain [M54.9] 01/08/2018  . Shuffling gait [R26.89] 01/08/2018  . Depression [F32.9] 01/08/2018   Total Time spent with patient: 20 minutes  Past Psychiatric History: depression  Past Medical History:  Past Medical History:  Diagnosis Date  . Depression     Past Surgical History:  Procedure Laterality Date  . HERNIA REPAIR     Family History: History reviewed. No pertinent family history. Family Psychiatric  History: none Social History:  Social History   Substance and Sexual Activity  Alcohol Use Not on file     Social History   Substance and Sexual Activity  Drug Use Not Currently    Social History   Socioeconomic History  . Marital status: Married    Spouse name: Not on file  . Number of children: Not on file  . Years of education: Not on file  . Highest education level: Not on file  Occupational History  . Not on file  Social Needs  . Financial resource strain: Not on file  . Food insecurity:    Worry: Not on file    Inability: Not on file  . Transportation needs:    Medical: Not on file    Non-medical: Not on file  Tobacco Use  . Smoking status: Former Research scientist (life sciences)  . Smokeless tobacco: Former Systems developer    Quit date: 01/10/2017  Substance and  Sexual Activity  . Alcohol use: Not on file  . Drug use: Not Currently  . Sexual activity: Not Currently  Lifestyle  . Physical activity:    Days per week: Not on file    Minutes per session: Not on file  . Stress: Not on file  Relationships  . Social connections:    Talks on phone: Not on file    Gets together: Not on file    Attends religious service: Not on file    Active member of club or organization: Not on file    Attends meetings of clubs or organizations: Not on file    Relationship status: Not on file  Other Topics Concern  . Not on file  Social History Narrative  . Not on file   Additional Social History:                         Sleep: Fair  Appetite:  Fair  Current Medications: Current Facility-Administered Medications  Medication Dose Route Frequency Provider Last Rate Last Dose  . acetaminophen (TYLENOL) tablet 650 mg  650 mg Oral Q6H PRN Clapacs, Madie Reno, MD   650 mg at 01/14/18 2303  . alum & mag hydroxide-simeth (MAALOX/MYLANTA) 200-200-20 MG/5ML suspension 30 mL  30 mL Oral Q4H PRN Clapacs, John T, MD      . feeding supplement (ENSURE ENLIVE) (ENSURE ENLIVE) liquid 237 mL  237 mL Oral BID BM Clapacs, John T, MD   237 mL at 01/20/18 1708  . fentaNYL (SUBLIMAZE) injection 25 mcg  25 mcg Intravenous Q5 min PRN Alvin Critchley, MD      . Gerhardt's butt cream   Topical BID Clapacs, Madie Reno, MD   1 application at 99/24/26 2125  . ibuprofen (ADVIL,MOTRIN) tablet 400 mg  400 mg Oral Q6H PRN Clapacs, Madie Reno, MD   400 mg at 01/17/18 2055  . ketorolac (TORADOL) 30 MG/ML injection 15 mg  15 mg Intravenous Once Clapacs, John T, MD      . magnesium hydroxide (MILK OF MAGNESIA) suspension 30 mL  30 mL Oral Daily PRN Clapacs, Madie Reno, MD   30 mL at 01/16/18 0852  . mirtazapine (REMERON) tablet 45 mg  45 mg Oral QHS Clapacs, Madie Reno, MD   45 mg at 01/20/18 2125  . multivitamin with minerals tablet 1 tablet  1 tablet Oral Daily Clapacs, Madie Reno, MD   1 tablet at 01/20/18  0933  . ondansetron (ZOFRAN) injection 4 mg  4 mg Intravenous Once PRN Alvin Critchley, MD      . pantoprazole (PROTONIX) EC tablet 40 mg  40 mg Oral Daily Clapacs, Madie Reno, MD   40 mg at 01/20/18 0933  . traZODone (DESYREL) tablet 100 mg  100 mg Oral QHS PRN Clapacs, Madie Reno, MD   100 mg at 01/17/18 2055    Lab Results:  Results for orders placed or performed during the hospital encounter of 01/12/18 (from the past 48 hour(s))  Glucose, capillary     Status: None   Collection Time: 01/19/18  6:49 AM  Result Value Ref Range   Glucose-Capillary 89 70 - 99 mg/dL   Comment 1 Notify RN     Blood Alcohol level:  Lab Results  Component Value Date   ETH <10 83/41/9622    Metabolic Disorder Labs: No results found for: HGBA1C, MPG No results found for: PROLACTIN No results found for: CHOL, TRIG, HDL, CHOLHDL, VLDL, LDLCALC  Physical Findings: AIMS:  , ,  ,  ,    CIWA:    COWS:     Musculoskeletal: Strength & Muscle Tone: decreased Gait & Station: unsteady Patient leans: N/A  Psychiatric Specialty Exam: Physical Exam  Nursing note and vitals reviewed. Psychiatric: Judgment and thought content normal. His affect is blunt. His speech is delayed. He is slowed and withdrawn. Cognition and memory are impaired. He exhibits a depressed mood.    Review of Systems  Musculoskeletal: Positive for falls.  Neurological: Positive for weakness.  Psychiatric/Behavioral: Positive for depression.  All other systems reviewed and are negative.   Blood pressure 126/79, pulse 68, temperature 98.2 F (36.8 C), temperature source Oral, resp. rate 16, height 5\' 10"  (1.778 m), weight 68.9 kg (152 lb), SpO2 98 %.Body mass index is 21.81 kg/m.  General Appearance: Casual  Eye Contact:  Good  Speech:  Clear and Coherent and Slow  Volume:  Decreased  Mood:  Depressed  Affect:  Depressed  Thought Process:  Goal Directed and Descriptions of Associations: Intact  Orientation:  Full (Time, Place, and  Person)  Thought Content:  WDL  Suicidal Thoughts:  No  Homicidal Thoughts:  No  Memory:  Immediate;   Fair Recent;   Fair Remote;   Fair  Judgement:  Fair  Insight:  Fair  Psychomotor Activity:  Psychomotor Retardation  Concentration:  Concentration: Fair and Attention Span: Fair  Recall:  Fair  Fund of Knowledge:  Fair  Language:  Fair  Akathisia:  No  Handed:  Right  AIMS (if indicated):     Assets:  Communication Skills Desire for Improvement Financial Resources/Insurance Housing Resilience Social Support  ADL's:  Intact  Cognition:  WNL  Sleep:  Number of Hours: 5.45     Treatment Plan Summary: Daily contact with patient to assess and evaluate symptoms and progress in treatment and Medication management   Mr. Manninen is a 62 year old male with a history of treatment resistant depression receiving ECT.   #Mood, not improving -continue Remeron 15 mg  nightly -continue Trazodone 100 mg nightly -ECT on Wed   #GERD -Protonix 40 mg daily  #Fall risk -uses a walker -1:1 siter  #Labs completed  #Disposition -discharge to home with grandson -follow up with regular psychiatrist  01/21/2018 No medication changes    Orson Slick, MD 01/20/2018, 10:24 PM

## 2018-01-20 NOTE — [facility]HH Group Notes (Signed)
Date/Time:  01/20/2018 1PM   Type of Therapy and Topic:  Group Therapy:  Healthy and Unhealthy Supports   Participation Level:  Active    Description of Group:  Patients in this group were introduced to the idea of adding a variety of healthy supports to address the various needs in their lives.Patients discussed what additional healthy supports could be helpful in their recovery and wellness after discharge in order to prevent future hospitalizations.   An emphasis was placed on using counselor, doctor, therapy groups, 12-step groups, and problem-specific support groups to expand supports.  They also worked as a group on developing a specific plan for several patients to deal with unhealthy supports through [place], psychoeducation with loved ones, and even termination of relationships.    Therapeutic Goals:               1)  discuss importance of adding supports to stay well once out of the hospital             2)  compare healthy versus unhealthy supports and identify some examples of each             3)  generate ideas and descriptions of healthy supports that can be added             4)  offer mutual support about how to address unhealthy supports             5)  encourage active participation in and adherence to discharge plan                Summary of Patient Progress:  Actively and appropriately engaged in the group. Patient was able to provide support and validation to other group members.Patient practiced active listening when interacting with the facilitator and other group members. The patient reports "My grandson is apart of my support system." Patient is still in the process of obtaining treatment goals.       Therapeutic Modalities:   Motivational Interviewing Brief Solution-Focused Therapy   Darin Engels, [place] 01/20/2018 2:26 PM

## 2018-01-20 NOTE — Progress Notes (Signed)
New order in for patient to be on 1:1 while awake. Pt has done well throughout the night. Patient rung the call bell when he needed to use the urinal throughout the night. X3

## 2018-01-20 NOTE — Progress Notes (Signed)
Talked to Dr.Clapacs at the beginning of shift about changing order for patient to not being on one to one throughout the night. Dr.Clapacs agreed to [place] taking him off the one to one. Patient up in the dayroom with no issues at this time.

## 2018-01-20 NOTE — [facility] Group Notes (Signed)
[facility] Group Notes:  (Nursing/MHT/Case Management/Adjunct)  Date:  01/20/2018  Time:  11:05 PM  Type of Therapy:  Group Therapy  Participation Level:  Active  Participation Quality:  Appropriate  Affect:  Appropriate  Cognitive:  Appropriate  Insight:  Appropriate  Engagement in Group:  Engaged  Modes of Intervention:  Discussion  Summary of Progress/Problems: MHT addressed the messes that were left behind and encouraged patients to clean up after themselves. MHT reviewed visiting hours and the number of people allowed on the unit at one time. MHT informed patients of the phone hours. MHT informed patients of the time the day room would be shut down. MHT informed patients of wake-up call at 6am for vitals. MHT answered questions and concerns about the rules and expectations. MHT had patients introduce self and tell group what type of animal they would choose to be and why. MHT processed with group about decision making. MHT reviewed list of things to do when making decisions. MHT encouraged group to utilize the social workers while in group to address concerns and develop plans to address issue when released. MHT informed group they could be linked to providers once discharged. MHT informed group that IPRS funds were available for mental health services. MHT informed group they need to recognize a need exists to participate in treatment and medication management. Latrelle was walking out at the first of group. Pius was able to be redirected to stay. [place] shared with group. [place] was attentive throughout group. Barnie Mort 01/20/2018, 11:05 PM

## 2018-01-21 NOTE — [facility] Group Notes (Signed)

## 2018-01-21 NOTE — Progress Notes (Signed)
[facility] MD Progress Note  01/22/2018 1:59 PM [name]  MRN:  [id number]  Subjective:    [name] is still rather flat and seems severely depressed but he is making good effort now. He is freaquently out of his room, walking with a walker, tries going to groups. His mood is sad, affect flat. Severe psychomotor retardation.  Principal Problem: Severe recurrent major depression without psychotic features ([place]) Diagnosis:   Patient Active Problem List   Diagnosis Date Noted  . Severe recurrent major depression without psychotic features ([place]) [F33.2] 01/09/2018    Priority: High  . Catatonia [F06.1] 01/12/2018  . Malnutrition of moderate degree [E44.0] 01/09/2018  . Pressure injury of skin [L89.90] 01/09/2018  . Back pain [M54.9] 01/08/2018  . Shuffling gait [R26.89] 01/08/2018  . Depression [F32.9] 01/08/2018   Total Time spent with patient: 20 minutes  Past Psychiatric History: depression  Past Medical History:  Past Medical History:  Diagnosis Date  . Depression     Past Surgical History:  Procedure Laterality Date  . HERNIA REPAIR     Family History: History reviewed. No pertinent family history. Family Psychiatric  History: none Social History:  Social History   Substance and Sexual Activity  Alcohol Use Not on file     Social History   Substance and Sexual Activity  Drug Use Not Currently    Social History   Socioeconomic History  . Marital status: Married    Spouse name: Not on file  . Number of children: Not on file  . Years of education: Not on file  . Highest education level: Not on file  Occupational History  . Not on file  Social Needs  . Financial resource strain: Not on file  . Food insecurity:    Worry: Not on file    Inability: Not on file  . Transportation needs:    Medical: Not on file    Non-medical: Not on file  Tobacco Use  . Smoking status: Former Research scientist (life sciences)  . Smokeless tobacco: Former Systems developer    Quit date: 01/10/2017  Substance and  Sexual Activity  . Alcohol use: Not on file  . Drug use: Not Currently  . Sexual activity: Not Currently  Lifestyle  . Physical activity:    Days per week: Not on file    Minutes per session: Not on file  . Stress: Not on file  Relationships  . Social connections:    Talks on phone: Not on file    Gets together: Not on file    Attends religious service: Not on file    Active member of club or organization: Not on file    Attends meetings of clubs or organizations: Not on file    Relationship status: Not on file  Other Topics Concern  . Not on file  Social History Narrative  . Not on file   Additional Social History:                         Sleep: Fair  Appetite:  Fair  Current Medications: Current Facility-Administered Medications  Medication Dose Route Frequency Provider Last Rate Last Dose  . acetaminophen (TYLENOL) tablet 650 mg  650 mg Oral Q6H PRN Clapacs, Madie Reno, MD   650 mg at 01/14/18 2303  . alum & mag hydroxide-simeth (MAALOX/MYLANTA) 200-200-20 MG/5ML suspension 30 mL  30 mL Oral Q4H PRN Clapacs, Madie Reno, MD      . feeding supplement (ENSURE ENLIVE) (ENSURE  ENLIVE) liquid 237 mL  237 mL Oral BID BM Clapacs, Madie Reno, MD   237 mL at 01/22/18 0905  . fentaNYL (SUBLIMAZE) injection 25 mcg  25 mcg Intravenous Q5 min PRN Alvin Critchley, MD      . Gerhardt's butt cream   Topical BID Clapacs, Madie Reno, MD   1 application at 16/96/78 [phone]  . ibuprofen (ADVIL,MOTRIN) tablet 400 mg  400 mg Oral Q6H PRN Clapacs, Madie Reno, MD   400 mg at 01/17/18 2055  . ketorolac (TORADOL) 30 MG/ML injection 15 mg  15 mg Intravenous Once Clapacs, John T, MD      . magnesium hydroxide (MILK OF MAGNESIA) suspension 30 mL  30 mL Oral Daily PRN Clapacs, Madie Reno, MD   30 mL at 01/16/18 0852  . mirtazapine (REMERON) tablet 45 mg  45 mg Oral QHS Clapacs, Madie Reno, MD   45 mg at 01/21/18 2122  . multivitamin with minerals tablet 1 tablet  1 tablet Oral Daily Clapacs, Madie Reno, MD   1 tablet at 01/22/18  [phone]  . ondansetron (ZOFRAN) injection 4 mg  4 mg Intravenous Once PRN Alvin Critchley, MD      . pantoprazole (PROTONIX) EC tablet 40 mg  40 mg Oral Daily Clapacs, Madie Reno, MD   40 mg at 01/22/18 0823  . traZODone (DESYREL) tablet 100 mg  100 mg Oral QHS PRN Clapacs, Madie Reno, MD   100 mg at 01/17/18 2055    Lab Results: No results found for this or any previous visit (from the past 48 hour(s)).  Blood Alcohol level:  Lab Results  Component Value Date   ETH <10 05/04/8526    Metabolic Disorder Labs: No results found for: HGBA1C, MPG No results found for: PROLACTIN No results found for: CHOL, TRIG, HDL, CHOLHDL, VLDL, LDLCALC  Physical Findings: AIMS:  , ,  ,  ,    CIWA:    COWS:     Musculoskeletal: Strength & Muscle Tone: within normal limits Gait & Station: normal Patient leans: N/A  Psychiatric Specialty Exam: Physical Exam  Nursing note and vitals reviewed. Psychiatric: Judgment and thought content normal. His affect is blunt. His speech is delayed. He is slowed and withdrawn. Cognition and memory are impaired. He exhibits a depressed mood.    Review of Systems  Neurological: Positive for weakness.  Psychiatric/Behavioral: Positive for depression.  All other systems reviewed and are negative.   Blood pressure 131/66, pulse 70, temperature 97.7 F (36.5 C), temperature source Oral, resp. rate 16, height 5\' 10"  (1.778 m), weight 72.6 kg (160 lb), SpO2 98 %.Body mass index is 22.96 kg/m.  General Appearance: Casual  Eye Contact:  Good  Speech:  Slow  Volume:  Decreased  Mood:  Depressed  Affect:  Blunt  Thought Process:  Goal Directed and Descriptions of Associations: Intact  Orientation:  Full (Time, Place, and Person)  Thought Content:  WDL  Suicidal Thoughts:  No  Homicidal Thoughts:  No  Memory:  Immediate;   Fair Recent;   Fair Remote;   Fair  Judgement:  Fair  Insight:  Fair  Psychomotor Activity:  Psychomotor Retardation  Concentration:  Concentration:  Poor and Attention Span: Poor  Recall:  Poor  Fund of Knowledge:  Fair  Language:  Fair  Akathisia:  No  Handed:  Right  AIMS (if indicated):     Assets:  Communication Skills Desire for Improvement Financial Resources/Insurance Housing Physical Health Resilience Social Support  ADL's:  Intact  Cognition:  WNL  Sleep:  Number of Hours: 5.25     Treatment Plan Summary: Daily contact with patient to assess and evaluate symptoms and progress in treatment and Medication management   Mr. Caras is a 62 year old male with a history of treatment resistant depression receiving ECT.   #Mood, not improving -continue Remeron 15 mg nightly -continue Trazodone 100 mg nightly -ECT on Wed   #GERD -Protonix 40 mg daily  #Fall risk -uses a walker -1:1 siter  #Labs completed  #Disposition -discharge to homewith grandson -follow up with regular psychiatrist  01/21/2018 No medication changes     Orson Slick, MD 01/22/2018, 1:59 PM

## 2018-01-21 NOTE — Plan of Care (Signed)
Pt. Participates in unit activities and groups. Pt. Denies SI/HI. Pt. Verbally is able to contract for safety. Pt. Verbalizes understanding of provided education.    Problem: Activity: Goal: Interest or engagement in activities will improve Outcome: Progressing   Problem: Safety: Goal: Periods of time without injury will increase Outcome: Progressing   Problem: Safety: Goal: Ability to disclose and discuss suicidal ideas will improve Outcome: Progressing   Problem: Education: Goal: Knowledge of General Education information will improve Outcome: Progressing

## 2018-01-21 NOTE — Progress Notes (Addendum)
Received [name] this AM in his room asleep with the sitter at the bedside. He got up to eat breakfast in the dinning room. His affect is brighter and the tone of his voice is louder and clearer. He is feeding himself with a normal rhythm. He was compliant with his medications.    0730 Asleep in bed  0800 Asleep in bed  0900  In the dinning room 1000  In his room 1100   In bed asleep 1200  In his room 1300  In the dinning room 1400  In the bathroom 1500  In the dayroom 1600 In the dinning room 1700  in the dinning room visiting with male  7  Visiting with a male  1900 Sitting in his room talking

## 2018-01-21 NOTE — Progress Notes (Signed)
1:1 Monitoring Observations  1900 Pt. Observed in the community room calm with 1:1 present 2000 Pt. Observed in the community room calm with 1:1 present 2100 Pt. Observed in his room with 1:1 present and calm 2200 Pt.  Observed in his room with 1:1 present and calm 2300 Pt. Observed in his room asleep with 1:1 present. Pt. Respirations even and unlabored.  0000 Pt. Observed in his room asleep with 1:1 present. Pt. Respirations even and unlabored.  0100 Pt. Observed in his room asleep with 1:1 present. Pt. Respirations even and unlabored.  0200 Pt. Observed in his room asleep with 1:1 present. Pt. Respirations even and unlabored.  0300 Pt. Observed in his room asleep with 1:1 present. Pt. Respirations even and unlabored.  0400 Pt. Observed in his room asleep with 1:1 present. Pt. Respirations even and unlabored.  0500 Pt. Observed in his room asleep with 1:1 present. Pt. Respirations even and unlabored.  0600 Pt. Observed in his room asleep with 1:1 present. Pt. Respirations even and unlabored.  0700 Pt. Observed at this time resting in his room calmly with 1:1 present.   Pt. Monitored per MD orders for safety and security.

## 2018-01-21 NOTE — Progress Notes (Signed)
D: Pt denies SI/HI/AVH. Pt is pleasant and cooperative, but presents sad and flat with poor eye contact often. Pt. Does cheer up periodically and will smile and laugh at times. Pt. has no Complaints.  Patient Interaction is appropriate, but minimal. Pt. Monitored for aspirations and high falls risks. Pt. Monitored with 1:1 sitter per orders. Pt. Up and walking around the unit with front wheel walker much more this evening and active in the milieu around peers.   A: Q x 15 minute observation checks were completed for safety. Patient was provided with education as well as extensive education on high falls risk safety and front wheel walker.  Patient was given scheduled medications. Patient  was encourage to attend groups, participate in unit activities and continue with plan of care. Pt. Chart and plans of care reviewed. Pt. Given support and encouragement.   R: Patient is complaint with medication and unit procedures. Pt. Attends groups and snacks. Pt. Eating good.               Precautionary checks every 15 minutes for safety maintained, room free of safety hazards, patient sustains no injury or falls during this shift.

## 2018-01-22 NOTE — Progress Notes (Signed)
PT Cancellation Note  Patient Details Name: [name] MRN: [id number] DOB: 02/06/1956   Cancelled Treatment:    Reason Eval/Treat Not Completed: Patient declined, no reason specified. Treatment attempted; declined due to meal time. Re attempt tomorrow.    Larae Grooms, PTA 01/22/2018, 4:56 PM

## 2018-01-22 NOTE — Progress Notes (Signed)
1900: Patient in room, sitting in the chair. Alert and oriented. Calm and cooperative. No sign of distress. Safety maintained on 1:1.

## 2018-01-22 NOTE — Progress Notes (Signed)
Patient in the dayroom with sitter. Eating snack. Calm and cooperative.

## 2018-01-22 NOTE — Progress Notes (Signed)
20:00 Patient sitting in the room, calm and cooperative. Remains on 1:1 for safety

## 2018-01-22 NOTE — [facility] Group Notes (Signed)
[facility] Group Notes:  (Nursing/MHT/Case Management/Adjunct)  Date:  01/22/2018  Time:  10:37 PM  Type of Therapy:  Group Therapy  Participation Level:  Active  Participation Quality:  Appropriate  Affect:  Appropriate  Cognitive:  Appropriate  Insight:  Appropriate  Engagement in Group:  Supportive  Modes of Intervention:  Support  Summary of Progress/Problems:  [name] 01/22/2018, 10:37 PM

## 2018-01-22 NOTE — Progress Notes (Signed)
Patient in room and remains cooperative. Sitter at bedside.

## 2018-01-22 NOTE — Progress Notes (Signed)
D: Pt denies SI/HI/AVH. Pt is pleasant and cooperative, but continues to report depression. Pt. Smiles frequently when speaking with staff and others. Pt. has no Complaints besides depression.  Patient Interaction appropriate. Pt. Given extensive education on high falls risk safety. Pt. Monitored with 1:1 for safety per MD orders.   A: Q x 15 minute observation checks were completed for safety. Patient was provided with education.  Patient was given scheduled medications. Patient  was encourage to attend groups, participate in unit activities and continue with plan of care. Pt. Chart and plans of care reviewed. Pt. Given support and encouragement.   R: Patient is complaint with medication and unit procedures. Pt. Attends groups and unit activities.             Precautionary checks every 15 minutes for safety maintained, room free of safety hazards, patient sustains no injury or falls during this shift.

## 2018-01-22 NOTE — Progress Notes (Signed)
Recreation Therapy Notes  Date: 01/22/2018  Time: 9:30 am   Location: Craft Room   Behavioral response: N/A   Intervention Topic:  Stress  Discussion/Intervention: Patient did not attend group.   Clinical Observations/Feedback:  Patient did not attend group.   Shakerra Red LRT/CTRS        Jaasia Viglione 01/22/2018 11:46 AM

## 2018-01-22 NOTE — Progress Notes (Signed)
2000- Sitting in bathroom 2100- Sitting in bed resting, 1:1 with sitter at bedside. 2200- Resting in bed eyed closed bed side monitor placed to monitor patient.

## 2018-01-22 NOTE — Plan of Care (Signed)
Pt. Participating more in groups and unit activities. Pt. Denies Si/Hi. Pt. Verbally is able to contract for safety. Pt. Verbalizes understanding of provided education. Pt. Verbalizes high fall risks educations.   Problem: Activity: Goal: Interest or engagement in activities will improve Outcome: Progressing   Problem: Safety: Goal: Periods of time without injury will increase Outcome: Progressing   Problem: Safety: Goal: Ability to disclose and discuss suicidal ideas will improve Outcome: Progressing   Problem: Education: Goal: Knowledge of General Education information will improve Outcome: Progressing

## 2018-01-22 NOTE — Plan of Care (Signed)
Patient cooperative with treatment and more active with treatment, he was compliant with medications. Logical and pleasant during interactions.

## 2018-01-22 NOTE — Progress Notes (Signed)
1:1 Monitoring Report  0700  Pt. Observed at this time in his room resting with 1:1 present for safety 0800 Pt. Observed at this time in his room using the bathroom with 1:1 present for safety 0900 Pt. Observed at this time in his room resting with 1:1 present for safety 1000 Pt. Observed at this time in his room resting with 1:1 present for safety 1100 Pt. Observed at this time in his room resting with 1:1 present for safety 1200 Pt. Observed at this time in his room resting with 1:1 present for safety 1300 Pt. Observed at this time in community room with 1:1 present for safety 1400 Pt. Observed at this time in room with 1:1 present for safety 1500  Pt. Observed at this time in community room with 1:1 present for safety 1600  Pt. Observed at this time in day community room with 1:1 present for safety 1700 Pt. Observed at this time in day community room with 1:1 present for safety 1800 Pt. Observed at this time in hallway with 1:1 present for safety 1900 pt. Observed at this time in community room with 1:1 present for safety.   Pt. Monitored per MD orders for safety and security.

## 2018-01-23 NOTE — Progress Notes (Signed)
[facility] MD Progress Note  01/23/2018 7:24 PM [name]  MRN:  [id number] Subjective: Follow-up for patient with severe depression.  Patient describes himself today as "melancholy".  Nevertheless he is cleaned up and dressed and clean closed today.  He has been eating better and has been walking around more.  Affect shows a little bit of reactivity to it.  No new specific complaints.  Able to actually discuss some things like possible future employment. Principal Problem: Severe recurrent major depression without psychotic features ([place]) Diagnosis:   Patient Active Problem List   Diagnosis Date Noted  . Catatonia [F06.1] 01/12/2018  . Malnutrition of moderate degree [E44.0] 01/09/2018  . Pressure injury of skin [L89.90] 01/09/2018  . Severe recurrent major depression without psychotic features ([place]) [F33.2] 01/09/2018  . Back pain [M54.9] 01/08/2018  . Shuffling gait [R26.89] 01/08/2018  . Depression [F32.9] 01/08/2018   Total Time spent with patient: 20 minutes  Past Psychiatric History: Long-standing depression  Past Medical History:  Past Medical History:  Diagnosis Date  . Depression     Past Surgical History:  Procedure Laterality Date  . HERNIA REPAIR     Family History: History reviewed. No pertinent family history. Family Psychiatric  History: None known Social History:  Social History   Substance and Sexual Activity  Alcohol Use Not on file     Social History   Substance and Sexual Activity  Drug Use Not Currently    Social History   Socioeconomic History  . Marital status: Married    Spouse name: Not on file  . Number of children: Not on file  . Years of education: Not on file  . Highest education level: Not on file  Occupational History  . Not on file  Social Needs  . Financial resource strain: Not on file  . Food insecurity:    Worry: Not on file    Inability: Not on file  . Transportation needs:    Medical: Not on file    Non-medical: Not on  file  Tobacco Use  . Smoking status: Former Research scientist (life sciences)  . Smokeless tobacco: Former Systems developer    Quit date: 01/10/2017  Substance and Sexual Activity  . Alcohol use: Not on file  . Drug use: Not Currently  . Sexual activity: Not Currently  Lifestyle  . Physical activity:    Days per week: Not on file    Minutes per session: Not on file  . Stress: Not on file  Relationships  . Social connections:    Talks on phone: Not on file    Gets together: Not on file    Attends religious service: Not on file    Active member of club or organization: Not on file    Attends meetings of clubs or organizations: Not on file    Relationship status: Not on file  Other Topics Concern  . Not on file  Social History Narrative  . Not on file   Additional Social History:                         Sleep: Fair  Appetite:  Fair  Current Medications: Current Facility-Administered Medications  Medication Dose Route Frequency Provider Last Rate Last Dose  . acetaminophen (TYLENOL) tablet 650 mg  650 mg Oral Q6H PRN Carsyn Boster, Madie Reno, MD   650 mg at 01/14/18 2303  . alum & mag hydroxide-simeth (MAALOX/MYLANTA) 200-200-20 MG/5ML suspension 30 mL  30 mL Oral Q4H PRN Jamina Macbeth,  Madie Reno, MD      . feeding supplement (ENSURE ENLIVE) (ENSURE ENLIVE) liquid 237 mL  237 mL Oral BID BM Aston Lieske T, MD   237 mL at 01/23/18 1435  . fentaNYL (SUBLIMAZE) injection 25 mcg  25 mcg Intravenous Q5 min PRN Alvin Critchley, MD      . Gerhardt's butt cream   Topical BID Kaseem Vastine, Madie Reno, MD      . ibuprofen (ADVIL,MOTRIN) tablet 400 mg  400 mg Oral Q6H PRN Susana Duell, Madie Reno, MD   400 mg at 01/17/18 2055  . ketorolac (TORADOL) 30 MG/ML injection 15 mg  15 mg Intravenous Once Lavaun Greenfield T, MD      . magnesium hydroxide (MILK OF MAGNESIA) suspension 30 mL  30 mL Oral Daily PRN Nehemiah Mcfarren, Madie Reno, MD   30 mL at 01/16/18 0852  . mirtazapine (REMERON) tablet 45 mg  45 mg Oral QHS Lileigh Fahringer, Madie Reno, MD   45 mg at 01/22/18 2213  .  multivitamin with minerals tablet 1 tablet  1 tablet Oral Daily Avyan Livesay, Madie Reno, MD   1 tablet at 01/23/18 0824  . ondansetron (ZOFRAN) injection 4 mg  4 mg Intravenous Once PRN Alvin Critchley, MD      . pantoprazole (PROTONIX) EC tablet 40 mg  40 mg Oral Daily Rashaunda Rahl, Madie Reno, MD   40 mg at 01/23/18 0824  . traZODone (DESYREL) tablet 100 mg  100 mg Oral QHS PRN [place], Madie Reno, MD   100 mg at 01/17/18 2055    Lab Results: No results found for this or any previous visit (from the past 48 hour(s)).  Blood Alcohol level:  Lab Results  Component Value Date   ETH <10 89/21/1942    Metabolic Disorder Labs: No results found for: HGBA1C, MPG No results found for: PROLACTIN No results found for: CHOL, TRIG, HDL, CHOLHDL, VLDL, LDLCALC  Physical Findings: AIMS:  , ,  ,  ,    CIWA:    COWS:     Musculoskeletal: Strength & Muscle Tone: decreased and atrophy Gait & Station: unsteady Patient leans: N/A  Psychiatric Specialty Exam: Physical Exam  Nursing note and vitals reviewed. Constitutional: He appears well-developed.  HENT:  Head: Normocephalic and atraumatic.  Eyes: Pupils are equal, round, and reactive to light. Conjunctivae are normal.  Neck: Normal range of motion.  Cardiovascular: Regular rhythm and normal heart sounds.  Respiratory: Effort normal. No respiratory distress.  GI: Soft.  Musculoskeletal: Normal range of motion.  Neurological: He is alert.  Skin: Skin is warm and dry.  Psychiatric: Judgment normal. His affect is blunt. His speech is delayed. He is slowed. Thought content is not paranoid. Cognition and memory are impaired. He expresses no homicidal and no suicidal ideation.    Review of Systems  Constitutional: Negative.   HENT: Negative.   Eyes: Negative.   Respiratory: Negative.   Cardiovascular: Negative.   Gastrointestinal: Negative.   Musculoskeletal: Negative.   Skin: Negative.   Neurological: Positive for weakness.  Psychiatric/Behavioral: Positive  for depression and memory loss. Negative for hallucinations, substance abuse and suicidal ideas. The patient is nervous/anxious. The patient does not have insomnia.     Blood pressure 132/81, pulse 72, temperature 97.9 F (36.6 C), temperature source Oral, resp. rate 18, height 5\' 10"  (1.778 m), weight 72.6 kg (160 lb), SpO2 100 %.Body mass index is 22.96 kg/m.  General Appearance: Fairly Groomed  Eye Contact:  Minimal  Speech:  Slow  Volume:  Decreased  Mood:  Dysphoric  Affect:  Flat  Thought Process:  Goal Directed  Orientation:  Full (Time, Place, and Person)  Thought Content:  Logical  Suicidal Thoughts:  No  Homicidal Thoughts:  No  Memory:  Immediate;   Fair Recent;   Fair Remote;   Fair  Judgement:  Impaired  Insight:  Fair  Psychomotor Activity:  Decreased  Concentration:  Concentration: Fair  Recall:  [employer] of Knowledge:  Fair  Language:  Fair  Akathisia:  No  Handed:  Right  AIMS (if indicated):     Assets:  Desire for Improvement Housing Social Support  ADL's:  Impaired  Cognition:  Impaired,  Mild  Sleep:  Number of Hours: 6.5     Treatment Plan Summary: Daily contact with patient to assess and evaluate symptoms and progress in treatment, Medication management and Plan So far tolerating ECT without difficulty.  We had a break in treatment but will resume ECT tomorrow morning.  Lots of encouragement and support to the patient no change to psychiatric medicine today.  Alethia Berthold, MD 01/23/2018, 7:24 PM

## 2018-01-23 NOTE — B[facility] Group Notes (Signed)
01/23/2018 1PM  Type of Therapy/Topic:  Group Therapy:  Feelings about Diagnosis  Participation Level:  Minimal   Description of Group:   This group will allow patients to explore their thoughts and feelings about diagnoses they have received. Patients will be guided to explore their level of understanding and acceptance of these diagnoses. Facilitator will encourage patients to process their thoughts and feelings about the reactions of others to their diagnosis and will guide patients in identifying ways to discuss their diagnosis with significant others in their lives. This group will be process-oriented, with patients participating in exploration of their own experiences, giving and receiving support, and processing challenge from other group members.   Therapeutic Goals: 1. Patient will demonstrate understanding of diagnosis as evidenced by identifying two or more symptoms of the disorder 2. Patient will be able to express two feelings regarding the diagnosis 3. Patient will demonstrate their ability to communicate their needs through discussion and/or role play  Summary of Patient Progress: Patient practiced active listening when interacting with the facilitator and other group members. [name] reports having a symptom of "feeling anxious" in reference to his diagnosis. Patient is still in the process of obtaining treatment goals.        Therapeutic Modalities:   Cognitive Behavioral Therapy Brief Therapy Feelings Identification    [name], Marlinda Mike 01/23/2018 2:53 PM

## 2018-01-23 NOTE — Plan of Care (Signed)
Up in the milieu, interacting with peers appropriately, compliant with treatment

## 2018-01-23 NOTE — Progress Notes (Signed)
Recreation Therapy Notes  Date: 01/23/2018  Time: 9:30 am  Location: Craft Room  Behavioral response: Appropriate    Intervention Topic: Relaxation  Discussion/Intervention:  Group content today was focused on relaxation. The group defined relaxation and identified healthy ways to relax. Individuals expressed how much time they spend relaxing. Patients expressed how much their life would be if they did not make time for themselves to relax. The group stated ways they could improve their relaxation techniques in the future.  Individuals participated in the intervention "Time to Relax" where they had a chance to experience different relaxation techniques.   Clinical Observations/Feedback:  Patient came to group and was focused on what peers and staff had to say about relaxation. He participated in the intervention and was social with staff during group. Zarina Pe LRT/CTRS         Haru Anspaugh 01/23/2018 10:49 AM

## 2018-01-23 NOTE — Progress Notes (Signed)
3-4 PM: Patient in room sitting in chair reading a magazine; Nurse tech within arms reach; no distress noted.  4-5 PM: Patient ate dinner in the dayroom with peers. No distress noted.  5-6 PM: Patient in Dayroom with nurse tech watching television. No distress noted.

## 2018-01-23 NOTE — Progress Notes (Signed)
Physical Therapy Treatment Patient Details Name: ANA LIAW MRN: [id number] DOB: 09/19/1955 Today's Date: 01/23/2018    History of Present Illness Per grandson (who pt lives with) he has been unmotivated to do anything over the last several months and has hardly done much more than get out of bed over the last several weeks. Pt diagnosed with severe recurrent depression without psychiatric features. Pt recently transfered to behavior med. New PT eval completed on 01/12/18. Pt began ECT treatments 3x/week beginning 01/12/18    PT Comments    Sitting EOB ready for session.  Stated he is "bored" today and pleased to have an activity.  Stood and was able to walk 200' x 2 with walker and min guard/supervision and complete exercises as below.  Sitter stating pt is walking to groups/activities during the day with walker with no incidents.    Pt stated he did not use RW prior to admission but feels it is helpful right now.  He reports R hip/groin pain and back pain but cannot recall when it started.  He presents with flexed posture during gait with occasional verbal cues to correct along with cues to increase step height as he shuffles his steps but has no LOB.   Follow Up Recommendations  Home health PT     Equipment Recommendations  Rolling walker with 5" wheels    Recommendations for Other Services       Precautions / Restrictions Precautions Precautions: Fall Restrictions Weight Bearing Restrictions: No    Mobility  Bed Mobility               General bed mobility comments: sitting EOB at start and end of session  Transfers Overall transfer level: Needs assistance Equipment used: Rolling walker (2 wheeled) Transfers: Sit to/from Stand Sit to Stand: Supervision            Ambulation/Gait Ambulation/Gait assistance: Supervision;Min guard Gait Distance (Feet): 200 Feet Assistive device: Rolling walker (2 wheeled) Gait Pattern/deviations: Shuffle Gait velocity: Within  functional limits for limited household mobility   General Gait Details: 200' x 2 with shuffling gait.  Overall improvement.  Some hip and back pain with gait.     Stairs             Wheelchair Mobility    Modified Rankin (Stroke Patients Only)       Balance Overall balance assessment: Needs assistance Sitting-balance support: Feet supported Sitting balance-Leahy Scale: Good     Standing balance support: Bilateral upper extremity supported Standing balance-Leahy Scale: Good                              Cognition Arousal/Alertness: Awake/alert Behavior During Therapy: WFL for tasks assessed/performed Overall Cognitive Status: Within Functional Limits for tasks assessed                                        Exercises Other Exercises Other Exercises: standing 2 x 10 with walker for heel raises, marches, SLR     General Comments        Pertinent Vitals/Pain Pain Assessment: 0-10 Pain Score: 4  Pain Location: back R hip/groin Pain Descriptors / Indicators: Aching Pain Intervention(s): Limited activity within patient's tolerance;Monitored during session    Home Living  Prior Function            PT Goals (current goals can now be found in the care plan section) Progress towards PT goals: Progressing toward goals    Frequency    Min 2X/week      PT Plan Current plan remains appropriate    Co-evaluation              AM-PAC PT "6 Clicks" Daily Activity  Outcome Measure  Difficulty turning over in bed (including adjusting bedclothes, sheets and blankets)?: A Little Difficulty moving from lying on back to sitting on the side of the bed? : A Little Difficulty sitting down on and standing up from a chair with arms (e.g., wheelchair, bedside commode, etc,.)?: A Little Help needed moving to and from a bed to chair (including a wheelchair)?: A Little Help needed walking in hospital room?: A  Little Help needed climbing 3-5 steps with a railing? : A Lot 6 Click Score: 17    End of Session Equipment Utilized During Treatment: Gait belt Activity Tolerance: Patient tolerated treatment well Patient left: with nursing/sitter in room Nurse Communication: Mobility status       Time: 1100-1114 PT Time Calculation (min) (ACUTE ONLY): 14 min  Charges:  $Gait Training: 8-22 mins                    G Codes:       Chesley Noon, PTA 01/23/18, 11:25 AM

## 2018-01-23 NOTE — Progress Notes (Signed)
6 PM-7 PM: Patient is sitting in Dayroom with nurse tech and peers; No distress noted.

## 2018-01-23 NOTE — Progress Notes (Signed)
Patient stayed in room until group time. Was seen in the dayroom but isolative in the group room. No visible interactions with peers. Patient remained on 1:1 until sleep time. Pleasant and cooperative. Patient came to his room and requested medications to be taken to him. Patient remained pleasant. Denying thoughts of self harm. Currently in bed sleeping and staff continue to monitor.

## 2018-01-23 NOTE — Progress Notes (Signed)
Patient stayed in the dayroom then presented to the medication room. Pleasant and cooperative. Received medications and was educated about  ECT in AM. Patient verbalized understanding. Currently no concern. Safety maintained, sitter at bedside.

## 2018-01-23 NOTE — Progress Notes (Signed)
Patient currently in the dayroom with staff and peers. Alert and oriented, well groomed and interacting with peers appropriately. Pleasant on approach. No sign of distress. He reports that his day went well, that he was able to attend some group activities today. Has no major concern at present. Patient is encouraged to talk to staff as needed. Safety precautions maintained on 1:1.

## 2018-01-23 NOTE — Progress Notes (Signed)
Patient is alert an oriented X 4, denies SI, HI and AVH. Patient has better eye contact with staff, improved appearance; patient showering and wearing regular clothing. Patient is out of the room participating in groups. Patient is interacting with peers. Patient is on a 1:1 for safety due to being high fall risk. Physical therapy works with patient for mobility.  Patient is pleasant; affect is bright. Nurse will continue to monitor.  7am-8 am: patient is in the dayroom with nurse tech and staff; eating breakfast; morning medication given; no distress noted.   9-10am Patient attended group meeting with nurse tech; patient worked with physical therapy; no distress noted; nurse tech present.  11-12; Patient is in room with nurse tech talking; no distress noted.

## 2018-01-23 NOTE — Progress Notes (Signed)
Patient stayed asleep and woke up in the morning. Pleasant and cooperative this morning. Currently on 1:1 for safety.

## 2018-01-23 NOTE — [facility] Group Notes (Signed)
CSW Group Therapy Note  01/23/2018  Time:  0900  Type of Therapy and Topic: Group Therapy: Goals Group: SMART Goals    Participation Level:  Active    Description of Group:   The purpose of a daily goals group is to assist and guide patients in setting recovery/wellness-related goals. The objective is to set goals as they relate to the crisis in which they were admitted. Patients will be using SMART goal modalities to set measurable goals. Characteristics of realistic goals will be discussed and patients will be assisted in setting and processing how one will reach their goal. Facilitator will also assist patients in applying interventions and coping skills learned in psycho-education groups to the SMART goal and process how one will achieve defined goal.    Therapeutic Goals:  -Patients will develop and document one goal related to or their crisis in which brought them into treatment.  -Patients will be guided by LCSW using SMART goal setting modality in how to set a measurable, attainable, realistic and time sensitive goal.  -Patients will process barriers in reaching goal.  -Patients will process interventions in how to overcome and successful in reaching goal.    Patient's Goal:  Pt continues to work towards their tx goals but has not yet reached them. Pt was able to appropriately participate in group discussion, and was able to offer support/validation to other group members. Pt reported his goal for the day is to, "get along with others and go to at least two groups by the end of the day."   Therapeutic Modalities:  Motivational Interviewing  Cognitive Behavioral Therapy  Crisis Intervention Model  SMART goals setting  Alden Hipp, MSW, LCSW Clinical Social Worker 01/23/2018 9:40 AM

## 2018-01-23 NOTE — Progress Notes (Signed)
20:00: Patient remains in the milieu. Currently in the dayroom with peers and staff. No sign of discomfort. Staff continue to monitor on 1:1.

## 2018-01-23 NOTE — Progress Notes (Addendum)
12-1 PM: Patient in dayroom eating lunch. Patient not in any distress, MHT present.  1 PM- 2PM: Patient awake in his room; laying in his bed; eyes open talking with nurse tech.   2PM- 3PM: Patient in room laying down in bed eyes open talking with Nurse Tech; no distress noted.

## 2018-01-23 NOTE — Plan of Care (Signed)
Cooperative and compliant with treatment 

## 2018-01-24 ENCOUNTER — Inpatient Hospital Stay: Payer: BLUE CROSS/BLUE SHIELD

## 2018-01-24 ENCOUNTER — Inpatient Hospital Stay: Payer: BLUE CROSS/BLUE SHIELD | Admitting: Anesthesiology

## 2018-01-24 ENCOUNTER — Other Ambulatory Visit: Payer: Self-pay | Admitting: Psychiatry

## 2018-01-24 LAB — GLUCOSE, CAPILLARY: GLUCOSE-CAPILLARY: 86 mg/dL (ref 70–99)

## 2018-01-24 MED ORDER — SUCCINYLCHOLINE CHLORIDE 20 MG/ML IJ SOLN
INTRAMUSCULAR | Status: AC
  Filled 2018-01-24: qty 1

## 2018-01-24 MED ORDER — SODIUM CHLORIDE 0.9 % IV SOLN
500.0000 mL | Freq: Once | INTRAVENOUS | Status: AC
  Administered 2018-01-24: 500 mL via INTRAVENOUS

## 2018-01-24 MED ORDER — KETOROLAC TROMETHAMINE 30 MG/ML IJ SOLN
INTRAMUSCULAR | Status: AC
  Administered 2018-01-24: 30 mg via INTRAVENOUS
  Filled 2018-01-24: qty 1

## 2018-01-24 MED ORDER — KETOROLAC TROMETHAMINE 30 MG/ML IJ SOLN
30.0000 mg | Freq: Once | INTRAMUSCULAR | Status: AC
  Administered 2018-01-24: 30 mg via INTRAVENOUS
  Filled 2018-01-24: qty 1

## 2018-01-24 MED ORDER — KETOROLAC TROMETHAMINE 30 MG/ML IJ SOLN
INTRAMUSCULAR | Status: AC
  Filled 2018-01-24: qty 1

## 2018-01-24 MED ORDER — SODIUM CHLORIDE 0.9 % IV SOLN
INTRAVENOUS | Status: DC | PRN
  Administered 2018-01-24: 10:00:00 via INTRAVENOUS

## 2018-01-24 MED ORDER — METHOHEXITAL SODIUM 100 MG/10ML IV SOSY
PREFILLED_SYRINGE | INTRAVENOUS | Status: DC | PRN
  Administered 2018-01-24: 70 mg via INTRAVENOUS

## 2018-01-24 MED ORDER — SUCCINYLCHOLINE CHLORIDE 200 MG/10ML IV SOSY
PREFILLED_SYRINGE | INTRAVENOUS | Status: DC | PRN
  Administered 2018-01-24: 80 mg via INTRAVENOUS

## 2018-01-24 NOTE — Transfer of Care (Signed)
Immediate Anesthesia Transfer of Care Note  Patient: [name]  Procedure(s) Performed: ECT TX  Patient Location: PACU  Anesthesia Type:General  Level of Consciousness: sedated  Airway & Oxygen Therapy: Patient Spontanous Breathing and Patient connected to face mask oxygen  Post-op Assessment: Report given to RN and Post -op Vital signs reviewed and stable  Post vital signs: Reviewed and stable  Last Vitals:  Vitals Value Taken Time  BP 163/97 01/24/2018 11:35 AM  Temp    Pulse 71 01/24/2018 11:35 AM  Resp 11 01/24/2018 11:35 AM  SpO2 100 % 01/24/2018 11:35 AM  Vitals shown include unvalidated device data.  Last Pain:  Vitals:   01/24/18 0853  TempSrc: Oral  PainSc:       Patients Stated Pain Goal: 0 (14/38/88 7579)  Complications: No apparent anesthesia complications

## 2018-01-24 NOTE — Anesthesia Postprocedure Evaluation (Signed)
Anesthesia Post Note  Patient: [name]  Procedure(s) Performed: ECT TX  Patient location during evaluation: PACU Anesthesia Type: General Level of consciousness: awake and alert Pain management: pain level controlled Vital Signs Assessment: post-procedure vital signs reviewed and stable Respiratory status: spontaneous breathing, nonlabored ventilation and respiratory function stable Cardiovascular status: blood pressure returned to baseline and stable Postop Assessment: no apparent nausea or vomiting Anesthetic complications: no     Last Vitals:  Vitals:   01/24/18 1216 01/24/18 1218  BP: (!) 146/82   Pulse: 82 81  Resp:    Temp:  37.2 C  SpO2: 99% 98%    Last Pain:  Vitals:   01/24/18 1218  TempSrc:   PainSc: 0-No pain                 Durenda Hurt

## 2018-01-24 NOTE — Progress Notes (Signed)
Patient resting in bed with his eyes closed, respirations even and unlabored, no unsafe behavior noted. Will continue to monitor.

## 2018-01-24 NOTE — Progress Notes (Signed)
Recreation Therapy Notes  Date: 01/24/2018  Time: 9:30 am   Location: Craft Room   Behavioral response: N/A   Intervention Topic:  Goals  Discussion/Intervention: Patient did not attend group.   Clinical Observations/Feedback:  Patient did not attend group.   Janit Cutter LRT/CTRS        Mckinley Olheiser 01/24/2018 11:21 AM

## 2018-01-24 NOTE — Progress Notes (Signed)
Patient slept throughout the night and was comfortable in bed. Woke up in the morning and expressed readiness for ECT. Vital signs WNL. CBG 89. Denies SI, HI and hallucination. Pleasant and cooperative. Currently in room awake, sitter at bedside.

## 2018-01-24 NOTE — Progress Notes (Signed)
Patient accepted his night time schedule medication and assisted to the restroom to urinate. No issues noted. Will continue to monitor.

## 2018-01-24 NOTE — Progress Notes (Signed)
This RN assisted the patient to the BR to urinate, no c/o pain or any acute distress noted. Patient denies any SI/HI/AVH. Will continue to monitor.

## 2018-01-24 NOTE — Tx Team (Signed)
Interdisciplinary Treatment and Diagnostic Plan Update  01/24/2018 Time of Session: reviewed by phone with Physician Kenzo Ozment Basista MRN: [id number]  Principal Diagnosis: Severe recurrent major depression without psychotic features ([place])  Secondary Diagnoses: Principal Problem:   Severe recurrent major depression without psychotic features ([place]) Active Problems:   Catatonia   Current Medications:  Current Facility-Administered Medications  Medication Dose Route Frequency Provider Last Rate Last Dose  . acetaminophen (TYLENOL) tablet 650 mg  650 mg Oral Q6H PRN Clapacs, Madie Reno, MD   650 mg at 01/14/18 2303  . alum & mag hydroxide-simeth (MAALOX/MYLANTA) 200-200-20 MG/5ML suspension 30 mL  30 mL Oral Q4H PRN Clapacs, John T, MD      . feeding supplement (ENSURE ENLIVE) (ENSURE ENLIVE) liquid 237 mL  237 mL Oral BID BM Clapacs, John T, MD   237 mL at 01/23/18 1435  . Gerhardt's butt cream   Topical BID Clapacs, John T, MD      . ibuprofen (ADVIL,MOTRIN) tablet 400 mg  400 mg Oral Q6H PRN Clapacs, Madie Reno, MD   400 mg at 01/17/18 2055  . ketorolac (TORADOL) 30 MG/ML injection           . magnesium hydroxide (MILK OF MAGNESIA) suspension 30 mL  30 mL Oral Daily PRN Clapacs, Madie Reno, MD   30 mL at 01/16/18 0852  . mirtazapine (REMERON) tablet 45 mg  45 mg Oral QHS Clapacs, Madie Reno, MD   45 mg at 01/23/18 2145  . multivitamin with minerals tablet 1 tablet  1 tablet Oral Daily Clapacs, Madie Reno, MD   1 tablet at 01/23/18 0824  . ondansetron (ZOFRAN) injection 4 mg  4 mg Intravenous Once PRN Alvin Critchley, MD      . pantoprazole (PROTONIX) EC tablet 40 mg  40 mg Oral Daily Clapacs, Madie Reno, MD   40 mg at 01/23/18 0824  . traZODone (DESYREL) tablet 100 mg  100 mg Oral QHS PRN Clapacs, Madie Reno, MD   100 mg at 01/17/18 2055   PTA Medications: Medications Prior to Admission  Medication Sig Dispense Refill Last Dose  . feeding supplement, ENSURE ENLIVE, (ENSURE ENLIVE) LIQD Take 237 mLs by mouth 2 (two)  times daily between meals. 237 mL 12 01/23/2018  . ibuprofen (ADVIL,MOTRIN) 600 MG tablet Take 1 tablet (600 mg total) by mouth every 6 (six) hours as needed for moderate pain. 30 tablet 0 01/23/2018  . Multiple Vitamin (MULTIVITAMIN WITH MINERALS) TABS tablet Take 1 tablet by mouth daily. 30 tablet 0 01/23/2018  . predniSONE (STERAPRED UNI-PAK 21 TAB) 10 MG (21) TBPK tablet Taper by 10 mg daily 21 tablet 0 01/23/2018  . vitamin B-12 1000 MCG tablet Take 1 tablet (1,000 mcg total) by mouth daily. 30 tablet 0 01/23/2018  . Vitamin D, Ergocalciferol, (DRISDOL) 50000 units CAPS capsule Take 1 capsule (50,000 Units total) by mouth every 7 (seven) days. 5 capsule 0 01/23/2018    Patient Stressors:    Patient Strengths:    Treatment Modalities: Medication Management, Group therapy, Case management,  1 to 1 session with clinician, Psychoeducation, Recreational therapy.   Physician Treatment Plan for Primary Diagnosis: Severe recurrent major depression without psychotic features ([place]) Long Term Goal(s): Improvement in symptoms so as ready for discharge Improvement in symptoms so as ready for discharge   Short Term Goals: Ability to demonstrate self-control will improve Ability to identify and develop effective coping behaviors will improve Ability to maintain clinical measurements within normal limits will improve Compliance with  prescribed medications will improve  Medication Management: Evaluate patient's response, side effects, and tolerance of medication regimen.  Therapeutic Interventions: 1 to 1 sessions, Unit Group sessions and Medication administration.  Evaluation of Outcomes: Progressing  Physician Treatment Plan for Secondary Diagnosis: Principal Problem:   Severe recurrent major depression without psychotic features ([place]) Active Problems:   Catatonia  Long Term Goal(s): Improvement in symptoms so as ready for discharge Improvement in symptoms so as ready for discharge   Short  Term Goals: Ability to demonstrate self-control will improve Ability to identify and develop effective coping behaviors will improve Ability to maintain clinical measurements within normal limits will improve Compliance with prescribed medications will improve     Medication Management: Evaluate patient's response, side effects, and tolerance of medication regimen.  Therapeutic Interventions: 1 to 1 sessions, Unit Group sessions and Medication administration.  Evaluation of Outcomes: Progressing   RN Treatment Plan for Primary Diagnosis: Severe recurrent major depression without psychotic features ([place]) Long Term Goal(s): Knowledge of disease and therapeutic regimen to maintain health will improve  Short Term Goals: Ability to identify and develop effective coping behaviors will improve and Compliance with prescribed medications will improve  Medication Management: RN will administer medications as ordered by provider, will assess and evaluate patient's response and provide education to patient for prescribed medication. RN will report any adverse and/or side effects to prescribing provider.  Therapeutic Interventions: 1 on 1 counseling sessions, Psychoeducation, Medication administration, Evaluate responses to treatment, Monitor vital signs and CBGs as ordered, Perform/monitor CIWA, COWS, AIMS and Fall Risk screenings as ordered, Perform wound care treatments as ordered.  Evaluation of Outcomes: Progressing   LCSW Treatment Plan for Primary Diagnosis: Severe recurrent major depression without psychotic features ([place]) Long Term Goal(s): Safe transition to appropriate next level of care at discharge, Engage patient in therapeutic group addressing interpersonal concerns.  Short Term Goals: Engage patient in aftercare planning with referrals and resources, Identify triggers associated with mental health/substance abuse issues and Increase skills for wellness and recovery  Therapeutic  Interventions: Assess for all discharge needs, 1 to 1 time with Social worker, Explore available resources and support systems, Assess for adequacy in community support network, Educate family and significant other(s) on suicide prevention, Complete Psychosocial Assessment, Interpersonal group therapy.  Evaluation of Outcomes: Progressing   Progress in Treatment: Attending groups: No. Participating in groups: No. Taking medication as prescribed: Yes. Toleration medication: Yes. Family/Significant other contact made: Yes, individual(s) contacted:  grandson [place] Patient understands diagnosis: Yes. Discussing patient identified problems/goals with staff: Yes. Medical problems stabilized or resolved: Yes. Denies suicidal/homicidal ideation: No. Issues/concerns per patient self-inventory: No. Other:    New problem(s) identified: No, Describe:     New Short Term/Long Term Goal(s):  Patient Goals:  To feel better, have more energy to take car of himself more, be more active again  Discharge Plan or Barriers: ECT treatment in hosptial, possibility of outpatient ECT follow up TBD.  Discharge home to family with outpatient follow up medication management.  Pt has progressed, up walking better with walker, coming to group occasionally, improved affect and appears to be showering more consistently.  Reason for Continuation of Hospitalization: Continued ECT, Medication management, Coordination of Aftercare.  Estimated Length of Stay: 7-10 days  Recreational Therapy: Patient Stressors: N/A Patient Goal: Patient will engage in groups without prompting or encouragement from LRT x3 group sessions within 5 recreation therapy group sessions  Attendees: Patient: 01/24/2018 2:59 PM  Physician: Dr. Weber Cooks 01/24/2018 2:59 PM  Nursing:  01/24/2018 2:59 PM  RN Care Manager: 01/24/2018 2:59 PM  Social Worker: Dossie Arbour, LCSW 01/24/2018 2:59 PM  Recreational Therapist:  01/24/2018 2:59 PM  Other:   01/24/2018 2:59 PM  Other:  01/24/2018 2:59 PM  Other: 01/24/2018 2:59 PM    Scribe for Treatment Team: August Saucer, LCSW 01/24/2018 2:59 PM

## 2018-01-24 NOTE — Procedures (Signed)
ECT SERVICES Physician's Interval Evaluation & Treatment Note  Patient Identification: BURNETT SPRAY MRN:  [id number] Date of Evaluation:  01/24/2018 TX #: [id number]  MADRS:   MMSE:   P.E. Findings:  Patient is gaining a little weight and looks stronger  Psychiatric Interval Note:  Still depressed although perhaps a little more energetic  Subjective:  Patient is a 62 y.o. male seen for evaluation for Electroconvulsive Therapy. Still feels depressed and "melancholy"  Treatment Summary:   []   Right Unilateral             [x]  Bilateral   % Energy : 1.0 ms 100%   Impedance: 1150 ohms  Seizure Energy Index: 3170 V squared  Postictal Suppression Index: 91%  Seizure Concordance Index: 98%  Medications  Pre Shock: Brevital 70 mg succinylcholine 80 mg  Post Shock:    Seizure Duration: 13 seconds EMG 46 seconds EEG   Comments: Tolerating these treatments well Next treatment Friday  Lungs:  [x]   Clear to auscultation               []  Other:   Heart:    [x]   Regular rhythm             []  irregular rhythm    [x]   Previous H&P reviewed, patient examined and there are NO CHANGES                 []   Previous H&P reviewed, patient examined and there are changes noted.   Alethia Berthold, MD 7/17/201911:20 AM

## 2018-01-24 NOTE — Progress Notes (Signed)
2PM-3PM: Patient alert and oriented to unit; patient ate lunch; able to answer post ECT questions and take medications.  3-4PM: Patient in craft room participating in group. No distress noted, Nurse tech within reach.  4 PM- 5 PM: Patient in dayroom with Nurse tech watching television and talking with peers. No distress noted.  5PM-6PM: Patient went to the [place]; patient using four wheel walker to walk to the dayroom, Nurse tech with arms reach. Patient watching television with peers.  6PM-7PM: Patient in Dayroom watching the television. Patient is in no distress; nurse tech within arms reach.

## 2018-01-24 NOTE — Anesthesia Preprocedure Evaluation (Signed)
Anesthesia Evaluation  Patient identified by MRN, date of birth, ID band Patient awake    Reviewed: Allergy & Precautions, NPO status , Patient's Chart, lab work & pertinent test results  History of Anesthesia Complications Negative for: history of anesthetic complications  Airway Mallampati: II  TM Distance: >3 FB Neck ROM: Full    Dental no notable dental hx.    Pulmonary neg sleep apnea, neg COPD, former smoker,    breath sounds clear to auscultation       Cardiovascular (-) hypertension(-) Past MI and (-) CHF (-) dysrhythmias (-) Valvular Problems/Murmurs Rhythm:Regular Rate:Normal     Neuro/Psych neg Seizures PSYCHIATRIC DISORDERS Depression    GI/Hepatic Neg liver ROS, neg GERD  ,  Endo/Other  neg diabetes  Renal/GU negative Renal ROS     Musculoskeletal   Abdominal   Peds  Hematology   Anesthesia Other Findings   Reproductive/Obstetrics                             Anesthesia Physical  Anesthesia Plan  ASA: II  Anesthesia Plan: General   Post-op Pain Management:    Induction: Intravenous  PONV Risk Score and Plan: 2 and TIVA  Airway Management Planned: Mask  Additional Equipment:   Intra-op Plan:   Post-operative Plan:   Informed Consent: I have reviewed the patients History and Physical, chart, labs and discussed the procedure including the risks, benefits and alternatives for the proposed anesthesia with the patient or authorized representative who has indicated his/her understanding and acceptance.     Plan Discussed with:   Anesthesia Plan Comments:         Anesthesia Quick Evaluation

## 2018-01-24 NOTE — Progress Notes (Signed)
Recreation Therapy Notes  Date: 01/24/2018  Time: 3:00pm  Location: Craft room  Behavioral response: Appropriate  Group Type: Game  Participation level: Active  Communication: Patient was social with peers and staff.  Comments: N/A  Meegan Shanafelt LRT/CTRS         Brittanni Cariker 01/24/2018 4:01 PM

## 2018-01-24 NOTE — Progress Notes (Signed)
[facility] MD Progress Note  01/24/2018 11:25 PM [name]  MRN:  [id number] Subjective: Follow-up note for 62 year old man with severe depression and receiving ECT.  ECT treatment this morning completed without complication.  Patient with no new physical complaints.  He appears to be eating better.  Strength improving.  Improved hygiene.  Patient says he feels a little better but remains quiet withdrawn most of the time.  Blood sugars and vital signs stable. Principal Problem: Severe recurrent major depression without psychotic features ([place]) Diagnosis:   Patient Active Problem List   Diagnosis Date Noted  . Catatonia [F06.1] 01/12/2018  . Malnutrition of moderate degree [E44.0] 01/09/2018  . Pressure injury of skin [L89.90] 01/09/2018  . Severe recurrent major depression without psychotic features ([place]) [F33.2] 01/09/2018  . Back pain [M54.9] 01/08/2018  . Shuffling gait [R26.89] 01/08/2018  . Depression [F32.9] 01/08/2018   Total Time spent with patient: 30 minutes  Past Psychiatric History: Long-standing depression see previous note  Past Medical History:  Past Medical History:  Diagnosis Date  . Depression     Past Surgical History:  Procedure Laterality Date  . HERNIA REPAIR     Family History: History reviewed. No pertinent family history. Family Psychiatric  History: See previous note Social History:  Social History   Substance and Sexual Activity  Alcohol Use Not on file     Social History   Substance and Sexual Activity  Drug Use Not Currently    Social History   Socioeconomic History  . Marital status: Married    Spouse name: Not on file  . Number of children: Not on file  . Years of education: Not on file  . Highest education level: Not on file  Occupational History  . Not on file  Social Needs  . Financial resource strain: Not on file  . Food insecurity:    Worry: Not on file    Inability: Not on file  . Transportation needs:    Medical: Not on  file    Non-medical: Not on file  Tobacco Use  . Smoking status: Former Research scientist (life sciences)  . Smokeless tobacco: Former Systems developer    Quit date: 01/10/2017  Substance and Sexual Activity  . Alcohol use: Not on file  . Drug use: Not Currently  . Sexual activity: Not Currently  Lifestyle  . Physical activity:    Days per week: Not on file    Minutes per session: Not on file  . Stress: Not on file  Relationships  . Social connections:    Talks on phone: Not on file    Gets together: Not on file    Attends religious service: Not on file    Active member of club or organization: Not on file    Attends meetings of clubs or organizations: Not on file    Relationship status: Not on file  Other Topics Concern  . Not on file  Social History Narrative  . Not on file   Additional Social History:                         Sleep: Fair  Appetite:  Fair  Current Medications: Current Facility-Administered Medications  Medication Dose Route Frequency Provider Last Rate Last Dose  . acetaminophen (TYLENOL) tablet 650 mg  650 mg Oral Q6H PRN Nikoloz Huy, Madie Reno, MD   650 mg at 01/14/18 2303  . alum & mag hydroxide-simeth (MAALOX/MYLANTA) 200-200-20 MG/5ML suspension 30 mL  30 mL Oral  Q4H PRN Hania Cerone T, MD      . feeding supplement (ENSURE ENLIVE) (ENSURE ENLIVE) liquid 237 mL  237 mL Oral BID BM Bethan Adamek T, MD   237 mL at 01/24/18 1712  . Gerhardt's butt cream   Topical BID Aidyn Kellis T, MD      . ibuprofen (ADVIL,MOTRIN) tablet 400 mg  400 mg Oral Q6H PRN Cedra Villalon, Madie Reno, MD   400 mg at 01/17/18 2055  . magnesium hydroxide (MILK OF MAGNESIA) suspension 30 mL  30 mL Oral Daily PRN Reyhan Moronta, Madie Reno, MD   30 mL at 01/16/18 0852  . mirtazapine (REMERON) tablet 45 mg  45 mg Oral QHS Terril Chestnut, Madie Reno, MD   45 mg at 01/24/18 2126  . multivitamin with minerals tablet 1 tablet  1 tablet Oral Daily Kalonji Zurawski, Madie Reno, MD   1 tablet at 01/24/18 1711  . ondansetron (ZOFRAN) injection 4 mg  4 mg Intravenous  Once PRN Alvin Critchley, MD      . pantoprazole (PROTONIX) EC tablet 40 mg  40 mg Oral Daily Kirra Verga, Madie Reno, MD   40 mg at 01/24/18 1711  . traZODone (DESYREL) tablet 100 mg  100 mg Oral QHS PRN Yamilette Garretson, Madie Reno, MD   100 mg at 01/17/18 2055    Lab Results:  Results for orders placed or performed during the hospital encounter of 01/12/18 (from the past 48 hour(s))  Glucose, capillary     Status: None   Collection Time: 01/24/18  6:41 AM  Result Value Ref Range   Glucose-Capillary 86 70 - 99 mg/dL   Comment 1 Notify RN     Blood Alcohol level:  Lab Results  Component Value Date   ETH <10 24/58/0998    Metabolic Disorder Labs: No results found for: HGBA1C, MPG No results found for: PROLACTIN No results found for: CHOL, TRIG, HDL, CHOLHDL, VLDL, LDLCALC  Physical Findings: AIMS:  , ,  ,  ,    CIWA:    COWS:     Musculoskeletal: Strength & Muscle Tone: within normal limits Gait & Station: unsteady Patient leans: N/A  Psychiatric Specialty Exam: Physical Exam  Nursing note and vitals reviewed. Constitutional: He appears well-developed and well-nourished.  HENT:  Head: Normocephalic and atraumatic.  Eyes: Pupils are equal, round, and reactive to light. Conjunctivae are normal.  Neck: Normal range of motion.  Cardiovascular: Normal heart sounds.  Respiratory: Effort normal.  GI: Soft.  Musculoskeletal: Normal range of motion.  Neurological: He is alert.  Skin: Skin is warm and dry.  Psychiatric: Judgment normal. His affect is blunt. His speech is delayed. He is slowed. Cognition and memory are impaired. He expresses no homicidal and no suicidal ideation.    Review of Systems  Constitutional: Negative.   HENT: Negative.   Eyes: Negative.   Respiratory: Negative.   Cardiovascular: Negative.   Gastrointestinal: Negative.   Musculoskeletal: Negative.   Skin: Negative.   Neurological: Negative.   Psychiatric/Behavioral: Positive for depression and memory loss. The  patient is nervous/anxious.     Blood pressure 102/60, pulse 75, temperature 98.4 F (36.9 C), temperature source Oral, resp. rate 18, height 5\' 10"  (1.778 m), weight 73.5 kg (162 lb), SpO2 98 %.Body mass index is 23.24 kg/m.  General Appearance: Fairly Groomed  Eye Contact:  Fair  Speech:  Slow  Volume:  Decreased  Mood:  Dysphoric  Affect:  Blunt  Thought Process:  Coherent  Orientation:  Full (Time, Place, and Person)  Thought Content:  Logical  Suicidal Thoughts:  No  Homicidal Thoughts:  No  Memory:  Immediate;   Fair Recent;   Fair Remote;   Fair  Judgement:  Fair  Insight:  Fair  Psychomotor Activity:  Normal  Concentration:  Concentration: Fair  Recall:  [employer] of Knowledge:  Fair  Language:  Fair  Akathisia:  No  Handed:  Right  AIMS (if indicated):     Assets:  Desire for Improvement Housing  ADL's:  Impaired  Cognition:  Impaired,  Mild  Sleep:  Number of Hours: 6     Treatment Plan Summary: Daily contact with patient to assess and evaluate symptoms and progress in treatment, Medication management and Plan Patient is now on a full dose of mirtazapine and receiving bilateral ECT treatment.  Continue forward with ECT which appears to be so far well tolerated.  Encouraged daily physical activity and improved nutrition.  Patient still remains far too withdrawn and disabled to consider discharge into an unsupervised setting.  Alethia Berthold, MD 01/24/2018, 11:25 PM

## 2018-01-24 NOTE — Progress Notes (Signed)
Patient was assisted to bed and was encouraged to call for help. Contracted for safety. Camera in use while pt asleep.Patient has been sleeping and has no sign of distress.

## 2018-01-24 NOTE — Anesthesia Post-op Follow-up Note (Signed)
Anesthesia QCDR form completed.        

## 2018-01-24 NOTE — Anesthesia Procedure Notes (Signed)
Date/Time: 01/24/2018 11:27 AM Performed by: Dionne Bucy, CRNA Pre-anesthesia Checklist: Patient identified, Emergency Drugs available, Suction available and Patient being monitored Patient Re-evaluated:Patient Re-evaluated prior to induction Oxygen Delivery Method: Circle system utilized Preoxygenation: Pre-oxygenation with 100% oxygen Induction Type: IV induction Ventilation: Mask ventilation without difficulty and Mask ventilation throughout procedure Airway Equipment and Method: Bite block Placement Confirmation: positive ETCO2 Dental Injury: Teeth and Oropharynx as per pre-operative assessment

## 2018-01-24 NOTE — Plan of Care (Signed)
Patient is alert and oriented; denies SI; HI and AVH. Patient has improved during stay on unit. Patient is present more in dayroom and smiling; and dressing. Patient remains on a 1:1 due to high fall risks and unsteady gait. Patient is pleasant and compliant with medications. Patient has ECT procedure this morning. Nurse will continue to monitor upon return to unit. Problem: Activity: Goal: Interest or engagement in activities will improve Outcome: Progressing Goal: Sleeping patterns will improve Outcome: Progressing   Problem: Coping: Goal: Ability to verbalize frustrations and anger appropriately will improve Outcome: Progressing   Problem: Safety: Goal: Periods of time without injury will increase Outcome: Progressing   Problem: Education: Goal: Utilization of techniques to improve thought processes will improve Outcome: Progressing   Problem: Activity: Goal: Interest or engagement in leisure activities will improve Outcome: Progressing

## 2018-01-24 NOTE — H&P (Signed)
[name] is an 62 y.o. male.   Chief Complaint: Depressed  HPI: Severe depression  Past Medical History:  Diagnosis Date  . Depression     Past Surgical History:  Procedure Laterality Date  . HERNIA REPAIR      History reviewed. No pertinent family history. Social History:  reports that he has quit smoking. He quit smokeless tobacco use about a year ago. He reports that he has current or past drug history. His alcohol history is not on file.  Allergies: No Known Allergies  Medications Prior to Admission  Medication Sig Dispense Refill  . feeding supplement, ENSURE ENLIVE, (ENSURE ENLIVE) LIQD Take 237 mLs by mouth 2 (two) times daily between meals. 237 mL 12  . ibuprofen (ADVIL,MOTRIN) 600 MG tablet Take 1 tablet (600 mg total) by mouth every 6 (six) hours as needed for moderate pain. 30 tablet 0  . Multiple Vitamin (MULTIVITAMIN WITH MINERALS) TABS tablet Take 1 tablet by mouth daily. 30 tablet 0  . predniSONE (STERAPRED UNI-PAK 21 TAB) 10 MG (21) TBPK tablet Taper by 10 mg daily 21 tablet 0  . vitamin B-12 1000 MCG tablet Take 1 tablet (1,000 mcg total) by mouth daily. 30 tablet 0  . Vitamin D, Ergocalciferol, (DRISDOL) 50000 units CAPS capsule Take 1 capsule (50,000 Units total) by mouth every 7 (seven) days. 5 capsule 0    Results for orders placed or performed during the hospital encounter of 01/12/18 (from the past 48 hour(s))  Glucose, capillary     Status: None   Collection Time: 01/24/18  6:41 AM  Result Value Ref Range   Glucose-Capillary 86 70 - 99 mg/dL   Comment 1 Notify RN    No results found.  Review of Systems  Constitutional: Negative.   HENT: Negative.   Eyes: Negative.   Respiratory: Negative.   Cardiovascular: Negative.   Gastrointestinal: Negative.   Musculoskeletal: Negative.   Skin: Negative.   Neurological: Negative.   Psychiatric/Behavioral: Negative.     Blood pressure 126/69, pulse 67, temperature 97.9 F (36.6 C), temperature  source Oral, resp. rate 18, height 5\' 10"  (1.778 m), weight 73.5 kg (162 lb), SpO2 100 %. Physical Exam  Nursing note and vitals reviewed. Constitutional: He appears well-developed and well-nourished.  HENT:  Head: Normocephalic and atraumatic.  Eyes: Pupils are equal, round, and reactive to light. Conjunctivae are normal.  Neck: Normal range of motion.  Cardiovascular: Regular rhythm and normal heart sounds.  Respiratory: Effort normal.  GI: Soft.  Musculoskeletal: Normal range of motion.  Neurological: He is alert.  Skin: Skin is warm and dry.  Psychiatric: He has a normal mood and affect. His behavior is normal. Judgment and thought content normal.     Assessment/Plan Continue inpatient ECT  Alethia Berthold, MD 01/24/2018, 9:54 AM

## 2018-01-25 NOTE — Progress Notes (Signed)
[facility] MD Progress Note  01/25/2018 7:35 PM [name]  MRN:  [id number] Subjective: Follow-up for this patient with severe long-standing depression receiving ECT.  Patient says he is feeling better today.  Strength has clearly improved.  No longer requiring a sitter.  Eating better.  More clear cognition.  Communicating with his gas during visiting hours well. Principal Problem: Severe recurrent major depression without psychotic features ([place]) Diagnosis:   Patient Active Problem List   Diagnosis Date Noted  . Catatonia [F06.1] 01/12/2018  . Malnutrition of moderate degree [E44.0] 01/09/2018  . Pressure injury of skin [L89.90] 01/09/2018  . Severe recurrent major depression without psychotic features ([place]) [F33.2] 01/09/2018  . Back pain [M54.9] 01/08/2018  . Shuffling gait [R26.89] 01/08/2018  . Depression [F32.9] 01/08/2018   Total Time spent with patient: 30 minutes  Past Psychiatric History: Long-standing depression without previous treatment currently receiving ECT  Past Medical History:  Past Medical History:  Diagnosis Date  . Depression     Past Surgical History:  Procedure Laterality Date  . HERNIA REPAIR     Family History: History reviewed. No pertinent family history. Family Psychiatric  History: See previous note Social History:  Social History   Substance and Sexual Activity  Alcohol Use Not on file     Social History   Substance and Sexual Activity  Drug Use Not Currently    Social History   Socioeconomic History  . Marital status: Married    Spouse name: Not on file  . Number of children: Not on file  . Years of education: Not on file  . Highest education level: Not on file  Occupational History  . Not on file  Social Needs  . Financial resource strain: Not on file  . Food insecurity:    Worry: Not on file    Inability: Not on file  . Transportation needs:    Medical: Not on file    Non-medical: Not on file  Tobacco Use  . Smoking  status: Former Research scientist (life sciences)  . Smokeless tobacco: Former Systems developer    Quit date: 01/10/2017  Substance and Sexual Activity  . Alcohol use: Not on file  . Drug use: Not Currently  . Sexual activity: Not Currently  Lifestyle  . Physical activity:    Days per week: Not on file    Minutes per session: Not on file  . Stress: Not on file  Relationships  . Social connections:    Talks on phone: Not on file    Gets together: Not on file    Attends religious service: Not on file    Active member of club or organization: Not on file    Attends meetings of clubs or organizations: Not on file    Relationship status: Not on file  Other Topics Concern  . Not on file  Social History Narrative  . Not on file   Additional Social History:                         Sleep: Fair  Appetite:  Fair  Current Medications: Current Facility-Administered Medications  Medication Dose Route Frequency Provider Last Rate Last Dose  . acetaminophen (TYLENOL) tablet 650 mg  650 mg Oral Q6H PRN Abbegail Matuska, Madie Reno, MD   650 mg at 01/14/18 2303  . alum & mag hydroxide-simeth (MAALOX/MYLANTA) 200-200-20 MG/5ML suspension 30 mL  30 mL Oral Q4H PRN Erendira Crabtree, Madie Reno, MD      . feeding supplement (  ENSURE ENLIVE) (ENSURE ENLIVE) liquid 237 mL  237 mL Oral BID BM Braeley Buskey T, MD   237 mL at 01/25/18 1750  . Gerhardt's butt cream   Topical BID Avalin Briley T, MD      . ibuprofen (ADVIL,MOTRIN) tablet 400 mg  400 mg Oral Q6H PRN Sherle Mello, Madie Reno, MD   400 mg at 01/17/18 2055  . magnesium hydroxide (MILK OF MAGNESIA) suspension 30 mL  30 mL Oral Daily PRN Brittney Mucha, Madie Reno, MD   30 mL at 01/16/18 0852  . mirtazapine (REMERON) tablet 45 mg  45 mg Oral QHS Normajean Nash, Madie Reno, MD   45 mg at 01/24/18 2126  . multivitamin with minerals tablet 1 tablet  1 tablet Oral Daily Kross Swallows, Madie Reno, MD   1 tablet at 01/25/18 0904  . ondansetron (ZOFRAN) injection 4 mg  4 mg Intravenous Once PRN Alvin Critchley, MD      . pantoprazole (PROTONIX) EC  tablet 40 mg  40 mg Oral Daily Kenneth Lax, Madie Reno, MD   40 mg at 01/25/18 0904  . traZODone (DESYREL) tablet 100 mg  100 mg Oral QHS PRN Kassaundra Hair, Madie Reno, MD   100 mg at 01/17/18 2055    Lab Results:  Results for orders placed or performed during the hospital encounter of 01/12/18 (from the past 48 hour(s))  Glucose, capillary     Status: None   Collection Time: 01/24/18  6:41 AM  Result Value Ref Range   Glucose-Capillary 86 70 - 99 mg/dL   Comment 1 Notify RN     Blood Alcohol level:  Lab Results  Component Value Date   ETH <10 74/06/8785    Metabolic Disorder Labs: No results found for: HGBA1C, MPG No results found for: PROLACTIN No results found for: CHOL, TRIG, HDL, CHOLHDL, VLDL, LDLCALC  Physical Findings: AIMS:  , ,  ,  ,    CIWA:    COWS:     Musculoskeletal: Strength & Muscle Tone: within normal limits Gait & Station: normal Patient leans: N/A  Psychiatric Specialty Exam: Physical Exam  Nursing note and vitals reviewed. Constitutional: He appears well-developed and well-nourished.  HENT:  Head: Normocephalic and atraumatic.  Eyes: Pupils are equal, round, and reactive to light. Conjunctivae are normal.  Neck: Normal range of motion.  Cardiovascular: Regular rhythm and normal heart sounds.  Respiratory: Effort normal. No respiratory distress.  GI: Soft.  Musculoskeletal: Normal range of motion.  Neurological: He is alert.  Skin: Skin is warm and dry.  Psychiatric: Judgment normal. His affect is blunt. His speech is delayed. He is slowed. He expresses no suicidal ideation. He exhibits abnormal recent memory.    Review of Systems  Constitutional: Negative.   HENT: Negative.   Eyes: Negative.   Respiratory: Negative.   Cardiovascular: Negative.   Gastrointestinal: Negative.   Musculoskeletal: Negative.   Skin: Negative.   Neurological: Negative.   Psychiatric/Behavioral: Negative.     Blood pressure 113/69, pulse 68, temperature 99 F (37.2 C),  temperature source Oral, resp. rate 18, height 5\' 10"  (1.778 m), weight 73.5 kg (162 lb), SpO2 98 %.Body mass index is 23.24 kg/m.  General Appearance: Casual  Eye Contact:  Fair  Speech:  Clear and Coherent  Volume:  Normal  Mood:  Dysphoric  Affect:  Congruent  Thought Process:  Goal Directed  Orientation:  Full (Time, Place, and Person)  Thought Content:  Logical  Suicidal Thoughts:  No  Homicidal Thoughts:  No  Memory:  Immediate;  Fair Recent;   Fair Remote;   Fair  Judgement:  Fair  Insight:  Fair  Psychomotor Activity:  Decreased  Concentration:  Concentration: Fair  Recall:  [employer] of Knowledge:  Fair  Language:  Fair  Akathisia:  No  Handed:  Right  AIMS (if indicated):     Assets:  Desire for Improvement Resilience Social Support  ADL's:  Impaired  Cognition:  Impaired,  Mild  Sleep:  Number of Hours: 6     Treatment Plan Summary: Daily contact with patient to assess and evaluate symptoms and progress in treatment, Medication management and Plan Patient will continue to receive ECT several more days in the hospital.  Clearly showing improvement.  No change to medicine  Alethia Berthold, MD 01/25/2018, 7:35 PM

## 2018-01-25 NOTE — Plan of Care (Signed)
Affect brighter, smiles with engagement.  Attending groups.  Utilizing walker for ambulation without any difficulty.  Support offered.  Safety rounds maintained.

## 2018-01-25 NOTE — Progress Notes (Signed)
Recreation Therapy Notes  Date: 01/25/2018  Time: 3:00pm  Location: Craft room  Behavioral response: N/A  Group Type: Craft  Participation level: N/A  Communication: Patient did not attend group.  Comments: N/A  Leor Whyte LRT/CTRS        Annalee Meyerhoff 01/25/2018 4:19 PM

## 2018-01-25 NOTE — Progress Notes (Signed)
   01/25/18 0900  15 Minute Checks  Reason for Precaution Safety  Location Room  Environmental Check No potentially dangerous items visible

## 2018-01-25 NOTE — Progress Notes (Signed)
Patient slept for 8 hours. No safety issues observed. Resting quietly most part of this shift with his eyes closed, respirations even and unlabored, will continue monitor every 15 minutes.

## 2018-01-25 NOTE — [facility] Group Notes (Signed)
  01/25/2018  Time: 1PM  Type of Therapy/Topic:  Group Therapy:  Balance in Life  Participation Level:  Active  Description of Group:   This group will address the concept of balance and how it feels and looks when one is unbalanced. Patients will be encouraged to process areas in their lives that are out of balance and identify reasons for remaining unbalanced. Facilitators will guide patients in utilizing problem-solving interventions to address and correct the stressor making their life unbalanced. Understanding and applying boundaries will be explored and addressed for obtaining and maintaining a balanced life. Patients will be encouraged to explore ways to assertively make their unbalanced needs known to significant others in their lives, using other group members and facilitator for support and feedback.  Therapeutic Goals: 1. Patient will identify two or more emotions or situations they have that consume much of in their lives. 2. Patient will identify signs/triggers that life has become out of balance:  3. Patient will identify two ways to set boundaries in order to achieve balance in their lives:  4. Patient will demonstrate ability to communicate their needs through discussion and/or role plays  Summary of Patient Progress: Pt continues to work towards their tx goals but has not yet reached them. Pt was able to appropriately participate in group discussion, and was able to offer support/validation to other group members. Pt reported one area of his life he would like to devote more attention to is, "working." Pt reported one area of his life he would like to devote less attention to is, "my physical health."   Therapeutic Modalities:   Cognitive Behavioral Therapy Solution-Focused Therapy Assertiveness Training  Alden Hipp, MSW, LCSW Clinical Social Worker 01/25/2018 2:20 PM

## 2018-01-25 NOTE — Progress Notes (Signed)
   01/25/18 0930  15 Minute Checks  Reason for Precaution Safety  Location Bathroom  Environmental Check No potentially dangerous items visible

## 2018-01-25 NOTE — Progress Notes (Signed)
Safety sitter discontinued.

## 2018-01-25 NOTE — Progress Notes (Signed)
   01/25/18 1000  15 Minute Checks  Reason for Precaution Safety  Location Bathroom (taking a shower)  Environmental Check No potentially dangerous items visible

## 2018-01-25 NOTE — [facility] Group Notes (Signed)
LCSW Group Therapy Note 01/25/2018 9:00 AM  Type of Therapy and Topic:  Group Therapy:  Setting Goals  Participation Level:  Minimal  Description of Group: In this process group, patients discussed using strengths to work toward goals and address challenges.  Patients identified two positive things about themselves and one goal they were working on.  Patients were given the opportunity to share openly and support each other's plan for self-empowerment.  The group discussed the value of gratitude and were encouraged to have a daily reflection of positive characteristics or circumstances.  Patients were encouraged to identify a plan to utilize their strengths to work on current challenges and goals.  Therapeutic Goals 1. Patient will verbalize personal strengths/positive qualities and relate how these can assist with achieving desired personal goals 2. Patients will verbalize affirmation of peers plans for personal change and goal setting 3. Patients will explore the value of gratitude and positive focus as related to successful achievement of goals 4. Patients will verbalize a plan for regular reinforcement of personal positive qualities and circumstances.  Summary of Patient Progress:  Although late to group, [name] attempted to participate some in today's group discussion on setting goals using the SMART Model.  CSW commented on [name]'s mood being less depressed and asked if he felt that the ECT treatments that he is receiving have been effective.  [name] shared with CSW that he did attribute the ECT treatments as helping him to feel less depressed and less likely to isolate himself.  [name] shared with the group that his goal for the remaining time that he is on the unit is to "continue to get better so I can function better when I get discharged."     Therapeutic Modalities Cognitive [place], LCSW 01/25/2018 11:51 AM

## 2018-01-25 NOTE — Progress Notes (Signed)
   01/25/18 1100  15 Minute Checks  Reason for Precaution Safety  Location Room  Environmental Check No potentially dangerous items visible

## 2018-01-25 NOTE — Plan of Care (Signed)
Pt continues to progress towards goals and d/c. RN will continue to monitor.  

## 2018-01-25 NOTE — Progress Notes (Signed)
   01/25/18 1030  15 Minute Checks  Reason for Public librarian;Other (comment) (shower)  Environmental Check No potentially dangerous items visible

## 2018-01-25 NOTE — Progress Notes (Signed)
   01/25/18 1130  15 Minute Checks  Reason for Precaution Safety  Location Room  Environmental Check No potentially dangerous items visible

## 2018-01-25 NOTE — Progress Notes (Signed)
   01/25/18 0830  15 Minute Checks  Reason for Precaution Safety  Location Room  Environmental Check No potentially dangerous items visible

## 2018-01-25 NOTE — Progress Notes (Signed)
Recreation Therapy Notes  Date: 01/25/2018  Time: 9:30 am   Location: Craft Room   Behavioral response: N/A   Intervention Topic: Problem Solving  Discussion/Intervention: Patient did not attend group.   Clinical Observations/Feedback:  Patient did not attend group.   [name] LRT/CTRS        [name] 01/25/2018 12:37 PM 

## 2018-01-25 NOTE — Progress Notes (Signed)
Physical Therapy Treatment Patient Details Name: [name] MRN: [id number] DOB: Feb 09, 1956 Today's Date: 01/25/2018    History of Present Illness Per grandson (who pt lives with) he has been unmotivated to do anything over the last several months and has hardly done much more than get out of bed over the last several weeks. Pt diagnosed with severe recurrent depression without psychiatric features. Pt recently transfered to behavior med. New PT eval completed on 01/12/18. Pt began ECT treatments 3x/week beginning 01/12/18    PT Comments    Patient is making excellent progress with therapy. He is able to ambulate extensively throughout unit, mostly performed without a walker.  Patient provided repeated encouragement for upright posture to minimize strain of low back secondary to complaints of pain.  Decreased toe to floor clearance noted however no instability.  Patient is able to perform horizontal vertical head turns without lateral gait deviation.  Gait speed is within functional limits for full household mobility however would be limited in the community.  He is able to perform seated exercises with therapist however seated marching is limited due to complaints of right groin/hip pain.  He scored a 54 out of 56 on the BERG balance test indicating only some higher level balance deficits when reaching out of his base of support and with 360 degrees turns.  Patient advised that he is safe to ambulate around the unit without the walker as desired.  Continue recommend home health physical therapy for further conditioning due to prolonged hospitalization and weakness.   Follow Up Recommendations  Home health PT     Equipment Recommendations  Rolling walker with 5" wheels    Recommendations for Other Services       Precautions / Restrictions Precautions Precautions: Fall Restrictions Weight Bearing Restrictions: No    Mobility  Bed Mobility               General bed mobility  comments: Patient received from bathroom after showering and dressing.  Left sitting upright at edge of bed with sitter.  Not assessed on this date.  Transfers Overall transfer level: Needs assistance Equipment used: None Transfers: Sit to/from Stand Sit to Stand: Supervision         General transfer comment: Patient is able to perform sit to stands without upper extremity support.  Mild increase in time however patient is stable in standing without upper extremity support.  Ambulation/Gait Ambulation/Gait assistance: Supervision Gait Distance (Feet): 500 Feet Assistive device: Rolling walker (2 wheeled) Gait Pattern/deviations: Shuffle     General Gait Details: Patient is able to ambulate extensively throughout unit, mostly performed without a walker.  Patient provided repeated encouragement for upright posture to minimize strain of low back secondary to complaints of pain.  Decreased toe to floor clearance noted however no instability.  Patient is able to perform horizontal vertical head turns without lateral gait deviation.  Gait speed is within functional limits for full household mobility however would be limited in the community.   Stairs             Wheelchair Mobility    Modified Rankin (Stroke Patients Only)       Balance Overall balance assessment: Needs assistance Sitting-balance support: Feet supported Sitting balance-Leahy Scale: Good     Standing balance support: Bilateral upper extremity supported Standing balance-Leahy Scale: Good Standing balance comment: BERG performed and pt scored 54/56                 Standardized Balance  Assessment Standardized Balance Assessment : Berg Balance Test Berg Balance Test Sit to Stand: Able to stand without using hands and stabilize independently Standing Unsupported: Able to stand safely 2 minutes Sitting with Back Unsupported but Feet Supported on Floor or Stool: Able to sit safely and securely 2  minutes Stand to Sit: Sits safely with minimal use of hands Transfers: Able to transfer safely, minor use of hands Standing Unsupported with Eyes Closed: Able to stand 10 seconds safely Standing Ubsupported with Feet Together: Able to place feet together independently and stand 1 minute safely From Standing, Reach Forward with Outstretched Arm: Can reach forward >12 cm safely (5") From Standing Position, Pick up Object from Floor: Able to pick up shoe safely and easily From Standing Position, Turn to Look Behind Over each Shoulder: Looks behind from both sides and weight shifts well Turn 360 Degrees: Able to turn 360 degrees safely one side only in 4 seconds or less Standing Unsupported, Alternately Place Feet on Step/Stool: Able to stand independently and safely and complete 8 steps in 20 seconds Standing Unsupported, One Foot in Front: Able to place foot tandem independently and hold 30 seconds Standing on One Leg: Able to lift leg independently and hold > 10 seconds Total Score: 54        Cognition Arousal/Alertness: Awake/alert Behavior During Therapy: WFL for tasks assessed/performed Overall Cognitive Status: Within Functional Limits for tasks assessed                                 General Comments: Pt with low energy, flat affect, soft spoken, appearing depressed demeanor remained same throughout.  Improving from prior therapy sessions.      Exercises General Exercises - Lower Extremity Long Arc Quad: Both;10 reps;Seated Heel Slides: Both;10 reps;Seated Hip ABduction/ADduction: Both;10 reps;Seated Hip Flexion/Marching: Both;Seated;5 reps;Other (comment)(Stop due to complaints of right groin pain)    General Comments        Pertinent Vitals/Pain Pain Assessment: Faces Faces Pain Scale: Hurts a little bit Pain Location: Pt reports "a little" pain in R low back and R groin. Doesn't rate on NPRS. Complains during walking and seated marches Pain Descriptors /  Indicators: Aching Pain Intervention(s): Monitored during session    Home Living                      Prior Function            PT Goals (current goals can now be found in the care plan section) Acute Rehab PT Goals Patient Stated Goal: pt unable to state. PT Goal Formulation: With patient Time For Goal Achievement: 01/26/18 Potential to Achieve Goals: Fair Progress towards PT goals: Progressing toward goals    Frequency    Min 2X/week      PT Plan Current plan remains appropriate    Co-evaluation              AM-PAC PT "6 Clicks" Daily Activity  Outcome Measure  Difficulty turning over in bed (including adjusting bedclothes, sheets and blankets)?: None Difficulty moving from lying on back to sitting on the side of the bed? : None Difficulty sitting down on and standing up from a chair with arms (e.g., wheelchair, bedside commode, etc,.)?: None Help needed moving to and from a bed to chair (including a wheelchair)?: None Help needed walking in hospital room?: None Help needed climbing 3-5 steps with a railing? : A  Little 6 Click Score: 23    End of Session Equipment Utilized During Treatment: Gait belt Activity Tolerance: Patient tolerated treatment well Patient left: with nursing/sitter in room   PT Visit Diagnosis: Muscle weakness (generalized) (M62.81);Difficulty in walking, not elsewhere classified (R26.2);Unsteadiness on feet (R26.81);Other abnormalities of gait and mobility (R26.89)     Time: 3790-2409 PT Time Calculation (min) (ACUTE ONLY): 15 min  Charges:  $Therapeutic Exercise: 8-22 mins                    G Codes:       Renea Schoonmaker D Dallen Bunte PT, DPT, GCS    Marcena Dias 01/25/2018, 11:02 AM

## 2018-01-26 ENCOUNTER — Inpatient Hospital Stay: Payer: BLUE CROSS/BLUE SHIELD | Admitting: Anesthesiology

## 2018-01-26 ENCOUNTER — Encounter: Payer: Self-pay | Admitting: Anesthesiology

## 2018-01-26 ENCOUNTER — Other Ambulatory Visit: Payer: Self-pay | Admitting: Psychiatry

## 2018-01-26 LAB — GLUCOSE, CAPILLARY
GLUCOSE-CAPILLARY: 85 mg/dL (ref 70–99)
Glucose-Capillary: 85 mg/dL (ref 70–99)
Glucose-Capillary: 85 mg/dL (ref 70–99)

## 2018-01-26 MED ORDER — SODIUM CHLORIDE 0.9 % IV SOLN
500.0000 mL | Freq: Once | INTRAVENOUS | Status: AC
  Administered 2018-01-26: 500 mL via INTRAVENOUS

## 2018-01-26 MED ORDER — METHOHEXITAL SODIUM 100 MG/10ML IV SOSY
PREFILLED_SYRINGE | INTRAVENOUS | Status: DC | PRN
  Administered 2018-01-26: 70 mg via INTRAVENOUS

## 2018-01-26 MED ORDER — SUCCINYLCHOLINE CHLORIDE 20 MG/ML IJ SOLN
INTRAMUSCULAR | Status: AC
  Filled 2018-01-26: qty 1

## 2018-01-26 MED ORDER — METHOHEXITAL SODIUM 0.5 G IJ SOLR
INTRAMUSCULAR | Status: AC
  Filled 2018-01-26: qty 500

## 2018-01-26 MED ORDER — SODIUM CHLORIDE 0.9 % IV SOLN
INTRAVENOUS | Status: DC | PRN
  Administered 2018-01-26: 11:00:00 via INTRAVENOUS

## 2018-01-26 MED ORDER — SUCCINYLCHOLINE CHLORIDE 20 MG/ML IJ SOLN
INTRAMUSCULAR | Status: DC | PRN
  Administered 2018-01-26: 80 mg via INTRAVENOUS

## 2018-01-26 NOTE — Anesthesia Postprocedure Evaluation (Signed)
Anesthesia Post Note  Patient: [name]  Procedure(s) Performed: ECT TX  Patient location during evaluation: PACU Anesthesia Type: General Level of consciousness: awake and alert Pain management: pain level controlled Vital Signs Assessment: post-procedure vital signs reviewed and stable Respiratory status: spontaneous breathing, nonlabored ventilation, respiratory function stable and patient connected to nasal cannula oxygen Cardiovascular status: blood pressure returned to baseline and stable Postop Assessment: no apparent nausea or vomiting Anesthetic complications: no     Last Vitals:  Vitals:   01/26/18 1120 01/26/18 1130  BP: 138/76   Pulse: 65 71  Resp: 10 11  Temp:    SpO2: 100% 99%    Last Pain:  Vitals:   01/26/18 1120  TempSrc:   PainSc: 0-No pain                 Precious Haws Lafreda Casebeer

## 2018-01-26 NOTE — Procedures (Signed)
ECT SERVICES Physician's Interval Evaluation & Treatment Note  Patient Identification: [name] MRN:  [id number] Date of Evaluation:  01/26/2018 TX #: 6  MADRS:   MMSE:   P.E. Findings:  No change to physical exam except that he is stronger and gaining weight  Psychiatric Interval Note:  Mood is stated as improved.  Not feeling "melancholy"  Subjective:  Patient is a 62 y.o. male seen for evaluation for Electroconvulsive Therapy. Clearly feeling somewhat better  Treatment Summary:   []   Right Unilateral             [x]  Bilateral   % Energy : 1.0 ms, 100%   Impedance: 740 ohms  Seizure Energy Index: 2999 V squared  Postictal Suppression Index: 77%  Seizure Concordance Index: 98%  Medications  Pre Shock: Brevital 70 mg succinylcholine 80 mg  Post Shock:    Seizure Duration: EMG reading not obtained 22 seconds by EEG   Comments: This was a big decline in the length of the seizure down close to 20 seconds and so I am going to recommend that we change to ketamine for anesthesia on Monday  Lungs:  [x]   Clear to auscultation               []  Other:   Heart:    [x]   Regular rhythm             []  irregular rhythm    [x]   Previous H&P reviewed, patient examined and there are NO CHANGES                 []   Previous H&P reviewed, patient examined and there are changes noted.   Alethia Berthold, MD 7/19/201910:56 AM

## 2018-01-26 NOTE — Anesthesia Preprocedure Evaluation (Signed)
Anesthesia Evaluation  Patient identified by MRN, date of birth, ID band Patient awake    Reviewed: Allergy & Precautions, NPO status , Patient's Chart, lab work & pertinent test results  History of Anesthesia Complications Negative for: history of anesthetic complications  Airway Mallampati: II  TM Distance: >3 FB Neck ROM: Full    Dental no notable dental hx.    Pulmonary neg sleep apnea, neg COPD, former smoker,    breath sounds clear to auscultation       Cardiovascular (-) hypertension(-) Past MI and (-) CHF (-) dysrhythmias (-) Valvular Problems/Murmurs Rhythm:Regular Rate:Normal     Neuro/Psych neg Seizures PSYCHIATRIC DISORDERS Depression    GI/Hepatic Neg liver ROS, neg GERD  ,  Endo/Other  neg diabetes  Renal/GU negative Renal ROS     Musculoskeletal   Abdominal   Peds  Hematology   Anesthesia Other Findings Past Medical History: No date: Depression  BMI    Body Mass Index:  23.24 kg/m      Reproductive/Obstetrics                             Anesthesia Physical  Anesthesia Plan  ASA: II  Anesthesia Plan: General   Post-op Pain Management:    Induction: Intravenous  PONV Risk Score and Plan: 2 and TIVA  Airway Management Planned: Mask and Natural Airway  Additional Equipment:   Intra-op Plan:   Post-operative Plan:   Informed Consent: I have reviewed the patients History and Physical, chart, labs and discussed the procedure including the risks, benefits and alternatives for the proposed anesthesia with the patient or authorized representative who has indicated his/her understanding and acceptance.   Dental Advisory Given  Plan Discussed with: Anesthesiologist, CRNA and Surgeon  Anesthesia Plan Comments: (Patient consented for risks of anesthesia including but not limited to:  - adverse reactions to medications - risk of intubation if required - damage to  teeth, lips or other oral mucosa - sore throat or hoarseness - Damage to heart, brain, lungs or loss of life  Patient voiced understanding.)        Anesthesia Quick Evaluation

## 2018-01-26 NOTE — H&P (Signed)
[name] is an 62 y.o. male.   Chief Complaint: Patient still feels a little down and depressed but much better HPI: History of long lasting severe depression and at atrophy of muscles and weakness  Past Medical History:  Diagnosis Date  . Depression     Past Surgical History:  Procedure Laterality Date  . HERNIA REPAIR      History reviewed. No pertinent family history. Social History:  reports that he has quit smoking. He quit smokeless tobacco use about 12 months ago. He reports that he has current or past drug history. His alcohol history is not on file.  Allergies: No Known Allergies  Medications Prior to Admission  Medication Sig Dispense Refill  . feeding supplement, ENSURE ENLIVE, (ENSURE ENLIVE) LIQD Take 237 mLs by mouth 2 (two) times daily between meals. 237 mL 12  . ibuprofen (ADVIL,MOTRIN) 600 MG tablet Take 1 tablet (600 mg total) by mouth every 6 (six) hours as needed for moderate pain. 30 tablet 0  . Multiple Vitamin (MULTIVITAMIN WITH MINERALS) TABS tablet Take 1 tablet by mouth daily. 30 tablet 0  . predniSONE (STERAPRED UNI-PAK 21 TAB) 10 MG (21) TBPK tablet Taper by 10 mg daily 21 tablet 0  . vitamin B-12 1000 MCG tablet Take 1 tablet (1,000 mcg total) by mouth daily. 30 tablet 0  . Vitamin D, Ergocalciferol, (DRISDOL) 50000 units CAPS capsule Take 1 capsule (50,000 Units total) by mouth every 7 (seven) days. 5 capsule 0    Results for orders placed or performed during the hospital encounter of 01/12/18 (from the past 48 hour(s))  Glucose, capillary     Status: None   Collection Time: 01/26/18  6:38 AM  Result Value Ref Range   Glucose-Capillary 85 70 - 99 mg/dL   Comment 1 Notify RN   Glucose, capillary     Status: None   Collection Time: 01/26/18  6:38 AM  Result Value Ref Range   Glucose-Capillary 85 70 - 99 mg/dL   Comment 1 Notify RN   Glucose, capillary     Status: None   Collection Time: 01/26/18  6:38 AM  Result Value Ref Range   Glucose-Capillary 85 70 - 99 mg/dL   Comment 1 Notify RN    No results found.  Review of Systems  Constitutional: Negative.   HENT: Negative.   Eyes: Negative.   Respiratory: Negative.   Cardiovascular: Negative.   Gastrointestinal: Negative.   Musculoskeletal: Negative.   Skin: Negative.   Neurological: Negative.   Psychiatric/Behavioral: Negative.     Blood pressure 128/76, pulse 66, temperature 97.9 F (36.6 C), temperature source Oral, resp. rate 18, height 5\' 10"  (1.778 m), weight 73.5 kg (162 lb), SpO2 98 %. Physical Exam  Nursing note and vitals reviewed. Constitutional: He appears well-developed and well-nourished.  HENT:  Head: Normocephalic and atraumatic.  Eyes: Pupils are equal, round, and reactive to light. Conjunctivae are normal.  Neck: Normal range of motion.  Cardiovascular: Normal heart sounds.  Respiratory: Effort normal.  GI: Soft.  Musculoskeletal: Normal range of motion.  Neurological: He is alert. He displays atrophy. Gait abnormal.  Skin: Skin is warm and dry.  Psychiatric: Judgment normal. His affect is blunt. His speech is delayed. He is slowed. Thought content is not paranoid. He expresses no homicidal and no suicidal ideation. He exhibits abnormal recent memory.     Assessment/Plan Continue inpatient ECT although we are probably getting closer to a discharge plan  Alethia Berthold, MD 01/26/2018, 10:54 AM

## 2018-01-26 NOTE — Anesthesia Post-op Follow-up Note (Signed)
Anesthesia QCDR form completed.        

## 2018-01-26 NOTE — [facility] Group Notes (Signed)
01/26/2018 1PM  Type of Therapy and Topic:  Group Therapy:  Feelings around Relapse and Recovery  Participation Level:  Active   Description of Group:    Patients in this group will discuss emotions they experience before and after a relapse. They will process how experiencing these feelings, or avoidance of experiencing them, relates to having a relapse. Facilitator will guide patients to explore emotions they have related to recovery. Patients will be encouraged to process which emotions are more powerful. They will be guided to discuss the emotional reaction significant others in their lives may have to patients' relapse or recovery. Patients will be assisted in exploring ways to respond to the emotions of others without this contributing to a relapse.  Therapeutic Goals: 1. Patient will identify two or more emotions that lead to a relapse for them 2. Patient will identify two emotions that result when they relapse 3. Patient will identify two emotions related to recovery 4. Patient will demonstrate ability to communicate their needs through discussion and/or role plays   Summary of Patient Progress: Actively and appropriately engaged in the group. Patient was able to provide support and validation to other group members.Patient practiced active listening when interacting with the facilitator and other group members. Pt. Reports a coping skill that he can use while working towards recovery is "playing basketball and watching [employer] play". Patient is still in the process of obtaining treatment goals.      Therapeutic Modalities:   Cognitive Behavioral Therapy Solution-Focused Therapy Assertiveness Training Relapse Prevention Therapy   Darin Engels, Marlinda Mike 01/26/2018 2:37 PM

## 2018-01-26 NOTE — [facility] Group Notes (Signed)
[facility] Group Notes:  (Nursing/MHT/Case Management/Adjunct)  Date:  01/26/2018  Time:  3:12 PM  Type of Therapy:  Psychoeducational Skills  Participation Level:  Active  Participation Quality:  Appropriate, Attentive and Sharing  Affect:  Appropriate  Cognitive:  Alert and Appropriate  Insight:  Appropriate  Engagement in Group:  Engaged  Modes of Intervention:  Activity and Education  Summary of Progress/Problems:  [name] [name] [name] 01/26/2018, 3:12 PM 

## 2018-01-26 NOTE — Progress Notes (Signed)
Recreation Therapy Notes   Date: 01/26/2018  Time: 9:30 am   Location: Craft Room   Behavioral response: N/A   Intervention Topic: Leisure  Discussion/Intervention: Patient did not attend group.   Clinical Observations/Feedback:  Patient did not attend group.   Lizzette Carbonell LRT/CTRS         Erika Slaby 01/26/2018 12:09 PM

## 2018-01-26 NOTE — Progress Notes (Signed)
[facility] MD Progress Note  01/26/2018 4:02 PM [name]  MRN:  [id number] Subjective: Follow-up note for this 62 year old man with severe major depression.  Patient had bilateral ECT treatment again this morning.  Patient reports that his mood is "pretty good".  This is quite a bit better than what he used to say.  His physical activity has improved dramatically.  He is now able to ambulate safely and independently on the unit.  He is eating much better.  Patient is not talking about suicidal ideation and is able to have some positive thoughts about the future.  He still remains pretty disabled and needs a lot of supervision and assistance and would not be able to completely care for himself independently.  Patient had a visit with his grandson last night who agrees that there is definitely improvement Principal Problem: Severe recurrent major depression without psychotic features ([place]) Diagnosis:   Patient Active Problem List   Diagnosis Date Noted  . Catatonia [F06.1] 01/12/2018  . Malnutrition of moderate degree [E44.0] 01/09/2018  . Pressure injury of skin [L89.90] 01/09/2018  . Severe recurrent major depression without psychotic features ([place]) [F33.2] 01/09/2018  . Back pain [M54.9] 01/08/2018  . Shuffling gait [R26.89] 01/08/2018  . Depression [F32.9] 01/08/2018   Total Time spent with patient: 30 minutes  Past Psychiatric History: Patient has had this depression for many years without significant improvement  Past Medical History:  Past Medical History:  Diagnosis Date  . Depression     Past Surgical History:  Procedure Laterality Date  . HERNIA REPAIR     Family History: History reviewed. No pertinent family history. Family Psychiatric  History: See previous note Social History:  Social History   Substance and Sexual Activity  Alcohol Use Not on file     Social History   Substance and Sexual Activity  Drug Use Not Currently    Social History   Socioeconomic  History  . Marital status: Married    Spouse name: Not on file  . Number of children: Not on file  . Years of education: Not on file  . Highest education level: Not on file  Occupational History  . Not on file  Social Needs  . Financial resource strain: Not on file  . Food insecurity:    Worry: Not on file    Inability: Not on file  . Transportation needs:    Medical: Not on file    Non-medical: Not on file  Tobacco Use  . Smoking status: Former Research scientist (life sciences)  . Smokeless tobacco: Former Systems developer    Quit date: 01/10/2017  Substance and Sexual Activity  . Alcohol use: Not on file  . Drug use: Not Currently  . Sexual activity: Not Currently  Lifestyle  . Physical activity:    Days per week: Not on file    Minutes per session: Not on file  . Stress: Not on file  Relationships  . Social connections:    Talks on phone: Not on file    Gets together: Not on file    Attends religious service: Not on file    Active member of club or organization: Not on file    Attends meetings of clubs or organizations: Not on file    Relationship status: Not on file  Other Topics Concern  . Not on file  Social History Narrative  . Not on file   Additional Social History:  Sleep: Good  Appetite:  Good  Current Medications: Current Facility-Administered Medications  Medication Dose Route Frequency Provider Last Rate Last Dose  . acetaminophen (TYLENOL) tablet 650 mg  650 mg Oral Q6H PRN Temesgen Weightman, Madie Reno, MD   650 mg at 01/14/18 2303  . alum & mag hydroxide-simeth (MAALOX/MYLANTA) 200-200-20 MG/5ML suspension 30 mL  30 mL Oral Q4H PRN Zakhi Dupre T, MD      . feeding supplement (ENSURE ENLIVE) (ENSURE ENLIVE) liquid 237 mL  237 mL Oral BID BM Cailan Antonucci T, MD   237 mL at 01/25/18 1750  . Gerhardt's butt cream   Topical BID Beck Cofer T, MD      . ibuprofen (ADVIL,MOTRIN) tablet 400 mg  400 mg Oral Q6H PRN Leland Raver, Madie Reno, MD   400 mg at 01/17/18 2055  .  magnesium hydroxide (MILK OF MAGNESIA) suspension 30 mL  30 mL Oral Daily PRN Damilola Flamm, Madie Reno, MD   30 mL at 01/16/18 0852  . mirtazapine (REMERON) tablet 45 mg  45 mg Oral QHS Nolan Lasser, Madie Reno, MD   45 mg at 01/25/18 2117  . multivitamin with minerals tablet 1 tablet  1 tablet Oral Daily Phuong Moffatt, Madie Reno, MD   1 tablet at 01/25/18 0904  . ondansetron (ZOFRAN) injection 4 mg  4 mg Intravenous Once PRN Alvin Critchley, MD      . pantoprazole (PROTONIX) EC tablet 40 mg  40 mg Oral Daily Cebastian Neis, Madie Reno, MD   40 mg at 01/25/18 0904  . traZODone (DESYREL) tablet 100 mg  100 mg Oral QHS PRN Xitlali Kastens, Madie Reno, MD   100 mg at 01/25/18 2117    Lab Results:  Results for orders placed or performed during the hospital encounter of 01/12/18 (from the past 48 hour(s))  Glucose, capillary     Status: None   Collection Time: 01/26/18  6:38 AM  Result Value Ref Range   Glucose-Capillary 85 70 - 99 mg/dL   Comment 1 Notify RN   Glucose, capillary     Status: None   Collection Time: 01/26/18  6:38 AM  Result Value Ref Range   Glucose-Capillary 85 70 - 99 mg/dL   Comment 1 Notify RN   Glucose, capillary     Status: None   Collection Time: 01/26/18  6:38 AM  Result Value Ref Range   Glucose-Capillary 85 70 - 99 mg/dL   Comment 1 Notify RN     Blood Alcohol level:  Lab Results  Component Value Date   ETH <10 68/06/7516    Metabolic Disorder Labs: No results found for: HGBA1C, MPG No results found for: PROLACTIN No results found for: CHOL, TRIG, HDL, CHOLHDL, VLDL, LDLCALC  Physical Findings: AIMS: Facial and Oral Movements Muscles of Facial Expression: None, normal Lips and Perioral Area: None, normal Jaw: None, normal Tongue: None, normal,Extremity Movements Upper (arms, wrists, hands, fingers): None, normal Lower (legs, knees, ankles, toes): None, normal, Trunk Movements Neck, shoulders, hips: None, normal, Overall Severity Severity of abnormal movements (highest score from questions above):  None, normal Incapacitation due to abnormal movements: None, normal Patient's awareness of abnormal movements (rate only patient's report): No Awareness, Dental Status Current problems with teeth and/or dentures?: No Does patient usually wear dentures?: No  CIWA:    COWS:     Musculoskeletal: Strength & Muscle Tone: atrophy Gait & Station: shuffle Patient leans: N/A  Psychiatric Specialty Exam: Physical Exam  Nursing note and vitals reviewed. Constitutional: He appears well-developed and well-nourished.  HENT:  Head: Normocephalic and atraumatic.  Eyes: Pupils are equal, round, and reactive to light. Conjunctivae are normal.  Neck: Normal range of motion.  Cardiovascular: Regular rhythm and normal heart sounds.  Respiratory: Effort normal. No respiratory distress.  GI: Soft.  Musculoskeletal: Normal range of motion.  Neurological: He is alert.  Skin: Skin is warm and dry.  Psychiatric: Judgment normal. His affect is blunt. His speech is delayed. He is slowed. Thought content is not paranoid. He expresses no homicidal and no suicidal ideation. He exhibits abnormal recent memory.    Review of Systems  Constitutional: Negative.   HENT: Negative.   Eyes: Negative.   Respiratory: Negative.   Cardiovascular: Negative.   Gastrointestinal: Negative.   Musculoskeletal: Negative.   Skin: Negative.   Neurological: Negative.   Psychiatric/Behavioral: Negative.     Blood pressure 102/62, pulse 71, temperature 98.1 F (36.7 C), temperature source Oral, resp. rate 16, height 5\' 10"  (1.778 m), weight 73.5 kg (162 lb), SpO2 100 %.Body mass index is 23.24 kg/m.  General Appearance: Fairly Groomed  Eye Contact:  Fair  Speech:  Slow  Volume:  Decreased  Mood:  Euthymic  Affect:  Constricted  Thought Process:  Goal Directed  Orientation:  Full (Time, Place, and Person)  Thought Content:  Logical  Suicidal Thoughts:  No  Homicidal Thoughts:  No  Memory:  Immediate;   Fair Recent;    Poor Remote;   Fair  Judgement:  Fair  Insight:  Fair  Psychomotor Activity:  Decreased  Concentration:  Concentration: Fair  Recall:  [employer] of Knowledge:  Fair  Language:  Good  Akathisia:  No  Handed:  Right  AIMS (if indicated):     Assets:  Desire for Improvement Housing Social Support  ADL's:  Impaired  Cognition:  Impaired,  Mild  Sleep:  Number of Hours: 7.75     Treatment Plan Summary: Daily contact with patient to assess and evaluate symptoms and progress in treatment, Medication management and Plan Patient continues to show positive improvement to treatment plan that is including ECT as well as medication management.  Not yet ready for discharge but we may be closing in on that.  Next ECT will be on Monday.  His seizure was on the shorter side today and we will be switching to ketamine anesthetic at that point.  No other medication changes for today.  Alethia Berthold, MD 01/26/2018, 4:02 PM

## 2018-01-26 NOTE — Plan of Care (Signed)
ECT today.  Tolerated well.  Bright affect. Smiles with engagement.  Up to dayroom with peers watching TV. Verbalizes that feels better.  Support offered.  Safety maintained.   Problem: Activity: Goal: Interest or engagement in activities will improve Outcome: Progressing Goal: Sleeping patterns will improve Outcome: Progressing   Problem: Coping: Goal: Ability to verbalize frustrations and anger appropriately will improve Outcome: Progressing   Problem: Safety: Goal: Periods of time without injury will increase Outcome: Progressing   Problem: Education: Goal: Utilization of techniques to improve thought processes will improve Outcome: Progressing   Problem: Activity: Goal: Interest or engagement in leisure activities will improve Outcome: Progressing   Problem: Coping: Goal: Will verbalize feelings Outcome: Progressing   Problem: Safety: Goal: Ability to disclose and discuss suicidal ideas will improve Outcome: Progressing   Problem: Education: Goal: Knowledge of General Education information will improve Outcome: Progressing

## 2018-01-26 NOTE — Progress Notes (Signed)
Nursing note 7p-7a  Pt observed interacting with peers on unit this shift. Displayed a bright affect and cheerful mood upon interaction with this Probation officer. Pt observed ambulating to day room and in his room without issue. Pt complains of back pain but refused prn pain medication at that time. Pt denies SI/HI, and also denies any audio or visual hallucinations at this time. Pt does complain of having insomnia and request something for sleep. See MAR for prn medication administration. Pt educated on not eating or drinking after midnight to prepare for his procedure, pt verbalized understanding. Pt is able to verbally contract for safety with this RN. Pt is now resting in bed with eyes closed, with no signs or symptoms of pain or distress noted. Pt continues to remain safe on the unit and is observed by rounding every 15 min. RN will continue to monitor.

## 2018-01-26 NOTE — Transfer of Care (Signed)
Immediate Anesthesia Transfer of Care Note  Patient: [name]  Procedure(s) Performed: ECT TX  Patient Location: PACU  Anesthesia Type:General  Level of Consciousness: sedated and responds to stimulation  Airway & Oxygen Therapy: Patient Spontanous Breathing and Patient connected to face mask oxygen  Post-op Assessment: Report given to RN and Post -op Vital signs reviewed and stable  Post vital signs: Reviewed and stable  Last Vitals:  Vitals Value Taken Time  BP 146/80 01/26/2018 11:00 AM  Temp    Pulse 62 01/26/2018 11:00 AM  Resp 12 01/26/2018 11:00 AM  SpO2 100 % 01/26/2018 11:00 AM    Last Pain:  Vitals:   01/26/18 0918  TempSrc:   PainSc: 0-No pain      Patients Stated Pain Goal: 0 (51/10/21 1173)  Complications: No apparent anesthesia complications

## 2018-01-27 NOTE — Progress Notes (Signed)
Physical Therapy Treatment Patient Details Name: [name] MRN: [id number] DOB: [birth year] Today's Date: 01/27/2018    History of Present Illness Per grandson (who pt lives with) he has been unmotivated to do anything over the last several months and has hardly done much more than get out of bed over the last several weeks. Pt diagnosed with severe recurrent depression without psychiatric features. Pt recently transfered to behavior med. New PT eval completed on 01/12/18. Pt began ECT treatments 3x/week beginning 01/12/18    PT Comments    Pt transitions in and out of bed without difficulty.  Ambulated 800' on unit and completed exercises as below without seated rest. No AD for gait and one UE rail for balance with exercises.  While pt continues with dec step length and height he has no LOB or buckling.  Education for posture and general safety along with back pain education.   Follow Up Recommendations  Home health PT     Equipment Recommendations  Rolling walker   Recommendations for Other Services       Precautions / Restrictions Precautions Precautions: Fall Restrictions Weight Bearing Restrictions: No    Mobility  Bed Mobility Overal bed mobility: Modified Independent                Transfers Overall transfer level: Modified independent Equipment used: None Transfers: Sit to/from Stand Sit to Stand: Modified independent (Device/Increase time)            Ambulation/Gait Ambulation/Gait assistance: Supervision Gait Distance (Feet): 800 Feet   Gait Pattern/deviations: Step-through pattern;Decreased step length - right;Decreased step length - left Gait velocity: decreased   General Gait Details: decreased step length and height with verbal cues to stand up straight.  hands in pockets.   Stairs             Wheelchair Mobility    Modified Rankin (Stroke Patients Only)       Balance Overall balance assessment: Needs  assistance Sitting-balance support: Feet supported Sitting balance-Leahy Scale: Good     Standing balance support: No upper extremity supported Standing balance-Leahy Scale: Good                              Cognition Arousal/Alertness: Awake/alert Behavior During Therapy: WFL for tasks assessed/performed Overall Cognitive Status: Within Functional Limits for tasks assessed                                        Exercises Other Exercises Other Exercises: one UE support in standing for heel and toe raises, marches, SLR and ab/add x 10 without LOB.  vc's for posture.    General Comments        Pertinent Vitals/Pain Pain Assessment: 0-10 Pain Score: 4  Pain Location: c/o continued pain in LB and R hip with mobility Pain Descriptors / Indicators: Aching Pain Intervention(s): Monitored during session;Limited activity within patient's tolerance    Home Living                      Prior Function            PT Goals (current goals can now be found in the care plan section) Progress towards PT goals: Progressing toward goals    Frequency    Min 2X/week      PT Plan Current plan  remains appropriate    Co-evaluation              AM-PAC PT "6 Clicks" Daily Activity  Outcome Measure  Difficulty turning over in bed (including adjusting bedclothes, sheets and blankets)?: None Difficulty moving from lying on back to sitting on the side of the bed? : None Difficulty sitting down on and standing up from a chair with arms (e.g., wheelchair, bedside commode, etc,.)?: None Help needed moving to and from a bed to chair (including a wheelchair)?: None Help needed walking in hospital room?: None Help needed climbing 3-5 steps with a railing? : A Little 6 Click Score: 23    End of Session Equipment Utilized During Treatment: Gait belt Activity Tolerance: Patient tolerated treatment well Patient left: in bed         Time:  9030-0923 PT Time Calculation (min) (ACUTE ONLY): 16 min  Charges:  $Gait Training: 8-22 mins                    G Codes:       Chesley Noon, PTA 01/27/18, 10:30 AM

## 2018-01-27 NOTE — Progress Notes (Signed)
[facility] MD Progress Note  01/27/2018 2:55 PM [name]  MRN:  [id number] Subjective: Follow-up for this patient with a history of severe depression.  Patient is up out of his bed neatly dressed and groomed walking around with little support.  Found him today reading the newspaper.  Patient says his mood is not bad.  This is a remarkable improvement from where he was when he came in.  Health has improved eating has improved.  No longer appears to be hopeless.  Tolerating ECT well. Principal Problem: Severe recurrent major depression without psychotic features ([place]) Diagnosis:   Patient Active Problem List   Diagnosis Date Noted  . Catatonia [F06.1] 01/12/2018  . Malnutrition of moderate degree [E44.0] 01/09/2018  . Pressure injury of skin [L89.90] 01/09/2018  . Severe recurrent major depression without psychotic features ([place]) [F33.2] 01/09/2018  . Back pain [M54.9] 01/08/2018  . Shuffling gait [R26.89] 01/08/2018  . Depression [F32.9] 01/08/2018   Total Time spent with patient: 30 minutes  Past Psychiatric History: History of a depression that is been going on for almost a decade  Past Medical History:  Past Medical History:  Diagnosis Date  . Depression     Past Surgical History:  Procedure Laterality Date  . HERNIA REPAIR     Family History: History reviewed. No pertinent family history. Family Psychiatric  History: See previous notes Social History:  Social History   Substance and Sexual Activity  Alcohol Use Not on file     Social History   Substance and Sexual Activity  Drug Use Not Currently    Social History   Socioeconomic History  . Marital status: Married    Spouse name: Not on file  . Number of children: Not on file  . Years of education: Not on file  . Highest education level: Not on file  Occupational History  . Not on file  Social Needs  . Financial resource strain: Not on file  . Food insecurity:    Worry: Not on file    Inability: Not on file   . Transportation needs:    Medical: Not on file    Non-medical: Not on file  Tobacco Use  . Smoking status: Former Research scientist (life sciences)  . Smokeless tobacco: Former Systems developer    Quit date: 01/10/2017  Substance and Sexual Activity  . Alcohol use: Not on file  . Drug use: Not Currently  . Sexual activity: Not Currently  Lifestyle  . Physical activity:    Days per week: Not on file    Minutes per session: Not on file  . Stress: Not on file  Relationships  . Social connections:    Talks on phone: Not on file    Gets together: Not on file    Attends religious service: Not on file    Active member of club or organization: Not on file    Attends meetings of clubs or organizations: Not on file    Relationship status: Not on file  Other Topics Concern  . Not on file  Social History Narrative  . Not on file   Additional Social History:                         Sleep: Fair  Appetite:  Poor  Current Medications: Current Facility-Administered Medications  Medication Dose Route Frequency Provider Last Rate Last Dose  . acetaminophen (TYLENOL) tablet 650 mg  650 mg Oral Q6H PRN Clapacs, Madie Reno, MD   650  mg at 01/14/18 2303  . alum & mag hydroxide-simeth (MAALOX/MYLANTA) 200-200-20 MG/5ML suspension 30 mL  30 mL Oral Q4H PRN Clapacs, John T, MD      . feeding supplement (ENSURE ENLIVE) (ENSURE ENLIVE) liquid 237 mL  237 mL Oral BID BM Clapacs, John T, MD   237 mL at 01/27/18 1211  . Gerhardt's butt cream   Topical BID Clapacs, John T, MD      . ibuprofen (ADVIL,MOTRIN) tablet 400 mg  400 mg Oral Q6H PRN Clapacs, Madie Reno, MD   400 mg at 01/17/18 2055  . magnesium hydroxide (MILK OF MAGNESIA) suspension 30 mL  30 mL Oral Daily PRN Clapacs, Madie Reno, MD   30 mL at 01/16/18 0852  . mirtazapine (REMERON) tablet 45 mg  45 mg Oral QHS Clapacs, Madie Reno, MD   45 mg at 01/26/18 2033  . multivitamin with minerals tablet 1 tablet  1 tablet Oral Daily Clapacs, Madie Reno, MD   1 tablet at 01/27/18 [phone]  .  ondansetron (ZOFRAN) injection 4 mg  4 mg Intravenous Once PRN Alvin Critchley, MD      . pantoprazole (PROTONIX) EC tablet 40 mg  40 mg Oral Daily Clapacs, Madie Reno, MD   40 mg at 01/27/18 0839  . traZODone (DESYREL) tablet 100 mg  100 mg Oral QHS PRN Clapacs, Madie Reno, MD   100 mg at 01/25/18 2117    Lab Results:  Results for orders placed or performed during the hospital encounter of 01/12/18 (from the past 48 hour(s))  Glucose, capillary     Status: None   Collection Time: 01/26/18  6:38 AM  Result Value Ref Range   Glucose-Capillary 85 70 - 99 mg/dL   Comment 1 Notify RN   Glucose, capillary     Status: None   Collection Time: 01/26/18  6:38 AM  Result Value Ref Range   Glucose-Capillary 85 70 - 99 mg/dL   Comment 1 Notify RN   Glucose, capillary     Status: None   Collection Time: 01/26/18  6:38 AM  Result Value Ref Range   Glucose-Capillary 85 70 - 99 mg/dL   Comment 1 Notify RN     Blood Alcohol level:  Lab Results  Component Value Date   ETH <10 25/63/8937    Metabolic Disorder Labs: No results found for: HGBA1C, MPG No results found for: PROLACTIN No results found for: CHOL, TRIG, HDL, CHOLHDL, VLDL, LDLCALC  Physical Findings: AIMS: Facial and Oral Movements Muscles of Facial Expression: None, normal Lips and Perioral Area: None, normal Jaw: None, normal Tongue: None, normal,Extremity Movements Upper (arms, wrists, hands, fingers): None, normal Lower (legs, knees, ankles, toes): None, normal, Trunk Movements Neck, shoulders, hips: None, normal, Overall Severity Severity of abnormal movements (highest score from questions above): None, normal Incapacitation due to abnormal movements: None, normal Patient's awareness of abnormal movements (rate only patient's report): No Awareness, Dental Status Current problems with teeth and/or dentures?: No Does patient usually wear dentures?: No  CIWA:    COWS:     Musculoskeletal: Strength & Muscle Tone: within normal  limits Gait & Station: normal Patient leans: N/A  Psychiatric Specialty Exam: Physical Exam  Nursing note and vitals reviewed. Constitutional: He appears well-developed and well-nourished.  HENT:  Head: Normocephalic and atraumatic.  Eyes: Pupils are equal, round, and reactive to light. Conjunctivae are normal.  Neck: Normal range of motion.  Cardiovascular: Regular rhythm and normal heart sounds.  Respiratory: Effort normal. No respiratory distress.  GI: Soft.  Musculoskeletal: Normal range of motion.  Neurological: He is alert.  Skin: Skin is warm and dry.  Psychiatric: Judgment normal. His affect is blunt. His speech is delayed. He is slowed. Thought content is not paranoid. Cognition and memory are normal. He expresses no homicidal and no suicidal ideation.    Review of Systems  Constitutional: Negative.   HENT: Negative.   Eyes: Negative.   Respiratory: Negative.   Cardiovascular: Negative.   Gastrointestinal: Negative.   Musculoskeletal: Negative.   Skin: Negative.   Neurological: Negative.   Psychiatric/Behavioral: Positive for memory loss. Negative for depression, hallucinations, substance abuse and suicidal ideas. The patient is not nervous/anxious and does not have insomnia.     Blood pressure 115/63, pulse 69, temperature 97.9 F (36.6 C), temperature source Oral, resp. rate 18, height 5\' 10"  (1.778 m), weight 73.5 kg (162 lb), SpO2 100 %.Body mass index is 23.24 kg/m.  General Appearance: Fairly Groomed  Eye Contact:  Fair  Speech:  Slow  Volume:  Decreased  Mood:  Euthymic  Affect:  Constricted  Thought Process:  Coherent  Orientation:  Full (Time, Place, and Person)  Thought Content:  Logical  Suicidal Thoughts:  No  Homicidal Thoughts:  No  Memory:  Immediate;   Fair Recent;   Fair Remote;   Fair  Judgement:  Fair  Insight:  Fair  Psychomotor Activity:  Decreased  Concentration:  Concentration: Fair  Recall:  [employer] of Knowledge:  Fair   Language:  Fair  Akathisia:  No  Handed:  Right  AIMS (if indicated):     Assets:  Desire for Improvement Housing Physical Health Social Support  ADL's:  Impaired  Cognition:  Impaired,  Mild  Sleep:  Number of Hours: 7.15     Treatment Plan Summary: Daily contact with patient to assess and evaluate symptoms and progress in treatment, Medication management and Plan Patient continues to improve with ECT and medication management.  Looking much more positive.  Talk with him about possible discharge planning sometime into next week.  We will have to have some contact with his family to discuss whether he can get outpatient ECT.  Currently he is still improving with every treatment in the next ECT is scheduled for Monday morning.  Patient agrees to plan.  Alethia Berthold, MD 01/27/2018, 2:55 PM

## 2018-01-27 NOTE — [facility] Group Notes (Signed)
LCSW Group Therapy Note  01/27/2018 1:15pm  Type of Therapy and Topic: Group Therapy: Holding on to Grudges   Participation Level: Active   Description of Group:  In this group patients will be asked to explore and define a grudge. Patients will be guided to discuss their thoughts, feelings, and reasons as to why people have grudges. Patients will process the impact grudges have on daily life and identify thoughts and feelings related to holding grudges. Facilitator will challenge patients to identify ways to let go of grudges and the benefits this provides. Patients will be confronted to address why one struggles letting go of grudges. Lastly, patients will identify feelings and thoughts related to what life would look like without grudges. This group will be process-oriented, with patients participating in exploration of their own experiences, giving and receiving support, and processing challenge from other group members.  Therapeutic Goals:  1. Patient will identify specific grudges related to their personal life.  2. Patient will identify feelings, thoughts, and beliefs around grudges.  3. Patient will identify how one releases grudges appropriately.  4. Patient will identify situations where they could have let go of the grudge, but instead chose to hold on.   Summary of Patient Progress: The patient scored his mood at a 7 (10 best). Patients were guided to discuss their thoughts, feelings, and reasons as to why people have grudges. The patient was able to process the impact grudges have on daily life and identified thoughts and feelings related to holding grudges. The patient was challenged to identify ways to let go of grudges and the benefits this provides. Pt. actively and appropriately engaged in the group. Patient was able to provide support and validation to other group members.    Therapeutic Modalities:  Cognitive Behavioral Therapy  Solution Focused Therapy  Motivational  Interviewing  Brief Therapy   Cline Draheim  CUEBAS-COLON, LCSW 01/27/2018 10:14 AM

## 2018-01-27 NOTE — Plan of Care (Signed)
Per self inventory rates depression, anxiety and hopelessness as 6/10.  When asked how he is feeling verbalizes "about the same" Informed that he is doing better, patient states "I hope so"  affect is brighter.  Verbalizes feelings more freely.  Visible in the dayroom.  Utilizes walker at times and other times walks without walker without difficulty. Gait is slow but steady.  PT down working with patient Good appetite.  Maintains personal care chores.  Medication and group compliant.  Support and encouragement offered.  Safety maintained.   Problem: Activity: Goal: Interest or engagement in activities will improve Outcome: Progressing Goal: Sleeping patterns will improve Outcome: Progressing   Problem: Coping: Goal: Ability to verbalize frustrations and anger appropriately will improve Outcome: Progressing   Problem: Safety: Goal: Periods of time without injury will increase Outcome: Progressing   Problem: Education: Goal: Utilization of techniques to improve thought processes will improve Outcome: Progressing   Problem: Activity: Goal: Interest or engagement in leisure activities will improve Outcome: Progressing   Problem: Coping: Goal: Will verbalize feelings Outcome: Progressing   Problem: Safety: Goal: Ability to disclose and discuss suicidal ideas will improve Outcome: Progressing   Problem: Education: Goal: Knowledge of General Education information will improve Outcome: Progressing

## 2018-01-27 NOTE — Plan of Care (Signed)
Patient has improved greatly with his medications and therapy, patient is aware of his coping skills and able to voice and identify positive attributes of self, participating in groups and assertive with peers, safety is maintained , 15 minute rounding is in progress, patient voice no concerns at this time. Denies SI/HI/AVH. Problem: Activity: Goal: Interest or engagement in activities will improve Outcome: Progressing Goal: Sleeping patterns will improve Outcome: Progressing   Problem: Coping: Goal: Ability to verbalize frustrations and anger appropriately will improve Outcome: Progressing   Problem: Safety: Goal: Periods of time without injury will increase Outcome: Progressing   Problem: Education: Goal: Utilization of techniques to improve thought processes will improve Outcome: Progressing   Problem: Activity: Goal: Interest or engagement in leisure activities will improve Outcome: Progressing   Problem: Coping: Goal: Will verbalize feelings Outcome: Progressing   Problem: Safety: Goal: Ability to disclose and discuss suicidal ideas will improve Outcome: Progressing   Problem: Education: Goal: Knowledge of General Education information will improve Outcome: Progressing

## 2018-01-28 ENCOUNTER — Other Ambulatory Visit: Payer: Self-pay | Admitting: Psychiatry

## 2018-01-28 NOTE — [facility] Group Notes (Signed)
[place] Group Notes:  (Nursing/MHT/Case Management/Adjunct)  Date:  01/28/2018  Time:  10:21 PM  Type of Therapy:  Group Therapy  Participation Level:  Active  Participation Quality:  Appropriate  Affect:  Appropriate  Cognitive:  Appropriate  Insight:  Appropriate  Engagement in Group:  Engaged  Modes of Intervention:  Support  Summary of Progress/Problems:  Lenox Ladouceur 01/28/2018, 10:21 PM

## 2018-01-28 NOTE — Progress Notes (Signed)
[facility] MD Progress Note  01/28/2018 1:22 PM [name]  MRN:  [id number] Subjective: Follow-up for this patient with severe depression receiving ECT.  Patient has no complaints.  Says his mood is feeling good.  Appetite good.  Continues to feel stronger.  No real feeling of having any memory problems. Principal Problem: Severe recurrent major depression without psychotic features ([place]) Diagnosis:   Patient Active Problem List   Diagnosis Date Noted  . Catatonia [F06.1] 01/12/2018  . Malnutrition of moderate degree [E44.0] 01/09/2018  . Pressure injury of skin [L89.90] 01/09/2018  . Severe recurrent major depression without psychotic features ([place]) [F33.2] 01/09/2018  . Back pain [M54.9] 01/08/2018  . Shuffling gait [R26.89] 01/08/2018  . Depression [F32.9] 01/08/2018   Total Time spent with patient: 20 minutes  Past Psychiatric History: Long-standing depression going back years without response to treatment  Past Medical History:  Past Medical History:  Diagnosis Date  . Depression     Past Surgical History:  Procedure Laterality Date  . HERNIA REPAIR     Family History: History reviewed. No pertinent family history. Family Psychiatric  History: See previous note Social History:  Social History   Substance and Sexual Activity  Alcohol Use Not on file     Social History   Substance and Sexual Activity  Drug Use Not Currently    Social History   Socioeconomic History  . Marital status: Married    Spouse name: Not on file  . Number of children: Not on file  . Years of education: Not on file  . Highest education level: Not on file  Occupational History  . Not on file  Social Needs  . Financial resource strain: Not on file  . Food insecurity:    Worry: Not on file    Inability: Not on file  . Transportation needs:    Medical: Not on file    Non-medical: Not on file  Tobacco Use  . Smoking status: Former Research scientist (life sciences)  . Smokeless tobacco: Former Systems developer    Quit date:  01/10/2017  Substance and Sexual Activity  . Alcohol use: Not on file  . Drug use: Not Currently  . Sexual activity: Not Currently  Lifestyle  . Physical activity:    Days per week: Not on file    Minutes per session: Not on file  . Stress: Not on file  Relationships  . Social connections:    Talks on phone: Not on file    Gets together: Not on file    Attends religious service: Not on file    Active member of club or organization: Not on file    Attends meetings of clubs or organizations: Not on file    Relationship status: Not on file  Other Topics Concern  . Not on file  Social History Narrative  . Not on file   Additional Social History:                         Sleep: Fair  Appetite:  Fair  Current Medications: Current Facility-Administered Medications  Medication Dose Route Frequency Provider Last Rate Last Dose  . acetaminophen (TYLENOL) tablet 650 mg  650 mg Oral Q6H PRN Lawson Mahone, Madie Reno, MD   650 mg at 01/14/18 2303  . alum & mag hydroxide-simeth (MAALOX/MYLANTA) 200-200-20 MG/5ML suspension 30 mL  30 mL Oral Q4H PRN Quenten Nawaz T, MD      . feeding supplement (ENSURE ENLIVE) (ENSURE ENLIVE) liquid 237  mL  237 mL Oral BID BM Kaenan Jake, Madie Reno, MD   237 mL at 01/28/18 0936  . Gerhardt's butt cream   Topical BID Merik Mignano T, MD      . ibuprofen (ADVIL,MOTRIN) tablet 400 mg  400 mg Oral Q6H PRN Electra Paladino, Madie Reno, MD   400 mg at 01/17/18 2055  . magnesium hydroxide (MILK OF MAGNESIA) suspension 30 mL  30 mL Oral Daily PRN Fidelis Loth, Madie Reno, MD   30 mL at 01/16/18 0852  . mirtazapine (REMERON) tablet 45 mg  45 mg Oral QHS Dalyce Renne T, MD   45 mg at 01/27/18 2057  . multivitamin with minerals tablet 1 tablet  1 tablet Oral Daily Kyran Connaughton, Madie Reno, MD   1 tablet at 01/28/18 0901  . ondansetron (ZOFRAN) injection 4 mg  4 mg Intravenous Once PRN Alvin Critchley, MD      . pantoprazole (PROTONIX) EC tablet 40 mg  40 mg Oral Daily Armani Brar, Madie Reno, MD   40 mg at 01/28/18  0902  . traZODone (DESYREL) tablet 100 mg  100 mg Oral QHS PRN Aailyah Dunbar, Madie Reno, MD   100 mg at 01/25/18 2117    Lab Results: No results found for this or any previous visit (from the past 48 hour(s)).  Blood Alcohol level:  Lab Results  Component Value Date   ETH <10 44/07/270    Metabolic Disorder Labs: No results found for: HGBA1C, MPG No results found for: PROLACTIN No results found for: CHOL, TRIG, HDL, CHOLHDL, VLDL, LDLCALC  Physical Findings: AIMS: Facial and Oral Movements Muscles of Facial Expression: None, normal Lips and Perioral Area: None, normal Jaw: None, normal Tongue: None, normal,Extremity Movements Upper (arms, wrists, hands, fingers): None, normal Lower (legs, knees, ankles, toes): None, normal, Trunk Movements Neck, shoulders, hips: None, normal, Overall Severity Severity of abnormal movements (highest score from questions above): None, normal Incapacitation due to abnormal movements: None, normal Patient's awareness of abnormal movements (rate only patient's report): No Awareness, Dental Status Current problems with teeth and/or dentures?: No Does patient usually wear dentures?: No  CIWA:    COWS:     Musculoskeletal: Strength & Muscle Tone: within normal limits Gait & Station: normal Patient leans: N/A  Psychiatric Specialty Exam: Physical Exam  Nursing note and vitals reviewed. Constitutional: He appears well-developed and well-nourished.  HENT:  Head: Normocephalic and atraumatic.  Eyes: Pupils are equal, round, and reactive to light. Conjunctivae are normal.  Neck: Normal range of motion.  Cardiovascular: Regular rhythm and normal heart sounds.  Respiratory: Effort normal. No respiratory distress.  GI: Soft.  Musculoskeletal: Normal range of motion.  Neurological: He is alert.  Skin: Skin is warm and dry.  Psychiatric: Judgment normal. His affect is blunt. His speech is delayed. He is slowed. Thought content is not paranoid. He expresses  no homicidal and no suicidal ideation. He exhibits abnormal recent memory.    Review of Systems  Constitutional: Negative.   HENT: Negative.   Eyes: Negative.   Respiratory: Negative.   Cardiovascular: Negative.   Gastrointestinal: Negative.   Musculoskeletal: Negative.   Skin: Negative.   Neurological: Negative.   Psychiatric/Behavioral: Negative for depression, hallucinations, memory loss, substance abuse and suicidal ideas. The patient is not nervous/anxious and does not have insomnia.     Blood pressure 130/77, pulse 67, temperature 98.3 F (36.8 C), temperature source Oral, resp. rate 16, height 5\' 10"  (1.778 m), weight 73.5 kg (162 lb), SpO2 100 %.Body mass index is 23.24 kg/m.  General Appearance: Fairly Groomed  Eye Contact:  Good  Speech:  Clear and Coherent and Slow  Volume:  Decreased  Mood:  Euthymic  Affect:  Constricted  Thought Process:  Goal Directed  Orientation:  Full (Time, Place, and Person)  Thought Content:  Logical  Suicidal Thoughts:  No  Homicidal Thoughts:  No  Memory:  Immediate;   Fair Recent;   Fair Remote;   Fair  Judgement:  Fair  Insight:  Fair  Psychomotor Activity:  Decreased  Concentration:  Concentration: Fair  Recall:  [employer] of Knowledge:  Fair  Language:  Fair  Akathisia:  No  Handed:  Right  AIMS (if indicated):     Assets:  Communication Skills Desire for Improvement Housing Physical Health Resilience Social Support  ADL's:  Intact  Cognition:  WNL  Sleep:  Number of Hours: 7     Treatment Plan Summary: Daily contact with patient to assess and evaluate symptoms and progress in treatment, Medication management and Plan Patient has shown tremendous improvement but still very disabled.  Still will need assistance at home.  Plan for now is to continue current medication for depression as well as ECT.  He may be ready for discharge by the middle of this week depending on family resources available.  Patient agrees to  plan.  Alethia Berthold, MD 01/28/2018, 1:22 PM

## 2018-01-28 NOTE — Progress Notes (Addendum)
D: Patient is observed in his room resting quietly.  He denies any severe depressive symptoms.  He rates his depression and hopelessness as a 6; anxiety as a 4.  His goal is to "work on walking around and doing groups."  Patient continues to be unsteady on his feet and remains a high fall risk.  He denies any thoughts of self harm today.  Patient's mood is pleasant and he appears to be in no physical distress.  A: Continue to monitor medication management and MD orders. Safety checks continued every 15 minutes per protocol.  Offer support and encouragement as needed.  R: Patient is receptive to staff; his behavior is appropriate.

## 2018-01-28 NOTE — [facility] Group Notes (Signed)
LCSW Group Therapy Note 01/28/2018 1:15pm  Type of Therapy and Topic: Group Therapy: Feelings Around Returning Home & Establishing a Supportive Framework and Supporting Oneself When Supports Not Available  Participation Level: Active  Description of Group:  Patients first processed thoughts and feelings about upcoming discharge. These included fears of upcoming changes, lack of change, new living environments, judgements and expectations from others and overall stigma of mental health issues. The group then discussed the definition of a supportive framework, what that looks and feels like, and how do to discern it from an unhealthy non-supportive network. The group identified different types of supports as well as what to do when your family/friends are less than helpful or unavailable  Therapeutic Goals  1. Patient will identify one healthy supportive network that they can use at discharge. 2. Patient will identify one factor of a supportive framework and how to tell it from an unhealthy network. 3. Patient able to identify one coping skill to use when they do not have positive supports from others. 4. Patient will demonstrate ability to communicate their needs through discussion and/or role plays.  Summary of Patient Progress:  Patient reported he feels "pretty good." Pt engaged during group session. As patients processed their anxiety about discharge and described healthy supports patient shared he does not know if he is ready to be discharge.  Patients identified at least one self-care tool they were willing to use after discharge.   Therapeutic Modalities Cognitive Behavioral Therapy Motivational Interviewing   [name]  CUEBAS-COLON, LCSW 01/28/2018 10:04 AM

## 2018-01-28 NOTE — Plan of Care (Signed)
  Problem: Activity: Goal: Sleeping patterns will improve Outcome: Progressing   Problem: Coping: Goal: Ability to verbalize frustrations and anger appropriately will improve Outcome: Progressing   Problem: Safety: Goal: Periods of time without injury will increase Outcome: Progressing   Problem: Coping: Goal: Will verbalize feelings Outcome: Progressing   Problem: Safety: Goal: Ability to disclose and discuss suicidal ideas will improve Outcome: Progressing

## 2018-01-28 NOTE — Plan of Care (Signed)
Patient is improving in all areas, voice no complain , contract for safety of self and others , sleep long hours , denies SI/HI/AVH , responding well to treatment no noticeable side effects at this time , 15 minute rounding continues. Problem: Activity: Goal: Interest or engagement in activities will improve Outcome: Progressing Goal: Sleeping patterns will improve Outcome: Progressing   Problem: Coping: Goal: Ability to verbalize frustrations and anger appropriately will improve Outcome: Progressing   Problem: Safety: Goal: Periods of time without injury will increase Outcome: Progressing   Problem: Education: Goal: Utilization of techniques to improve thought processes will improve Outcome: Progressing   Problem: Activity: Goal: Interest or engagement in leisure activities will improve Outcome: Progressing   Problem: Coping: Goal: Will verbalize feelings Outcome: Progressing   Problem: Safety: Goal: Ability to disclose and discuss suicidal ideas will improve Outcome: Progressing   Problem: Education: Goal: Knowledge of General Education information will improve Outcome: Progressing

## 2018-01-29 ENCOUNTER — Inpatient Hospital Stay: Payer: BLUE CROSS/BLUE SHIELD

## 2018-01-29 ENCOUNTER — Inpatient Hospital Stay: Payer: BLUE CROSS/BLUE SHIELD | Admitting: Certified Registered"

## 2018-01-29 LAB — GLUCOSE, CAPILLARY: GLUCOSE-CAPILLARY: 87 mg/dL (ref 70–99)

## 2018-01-29 MED ORDER — PROMETHAZINE HCL 25 MG/ML IJ SOLN: 6.2500 mg | INTRAMUSCULAR | Status: DC | PRN

## 2018-01-29 MED ORDER — SUCCINYLCHOLINE CHLORIDE 20 MG/ML IJ SOLN
INTRAMUSCULAR | Status: AC
  Filled 2018-01-29: qty 1

## 2018-01-29 MED ORDER — SODIUM CHLORIDE 0.9 % IV SOLN
500.0000 mL | Freq: Once | INTRAVENOUS | Status: AC
  Administered 2018-01-29: 500 mL via INTRAVENOUS

## 2018-01-29 MED ORDER — KETAMINE HCL 50 MG/ML IJ SOLN
INTRAMUSCULAR | Status: AC
  Filled 2018-01-29: qty 10

## 2018-01-29 MED ORDER — KETAMINE HCL 10 MG/ML IJ SOLN
INTRAMUSCULAR | Status: DC | PRN
  Administered 2018-01-29: 100 mg via INTRAVENOUS

## 2018-01-29 MED ORDER — SUCCINYLCHOLINE CHLORIDE 20 MG/ML IJ SOLN
INTRAMUSCULAR | Status: DC | PRN
  Administered 2018-01-29: 80 mg via INTRAVENOUS

## 2018-01-29 NOTE — Progress Notes (Signed)
[facility] MD Progress Note  01/29/2018 9:11 PM [name]  MRN:  [id number] Subjective: Follow-up this patient with major depression receiving ECT.  ECT done today without difficulty.  No complications.  Patient reports mood is feeling better.  Patient strength continues to improve and his mood improve although he remains physically slowed down and mentally sluggish at times. Principal Problem: Severe recurrent major depression without psychotic features ([place]) Diagnosis:   Patient Active Problem List   Diagnosis Date Noted  . Catatonia [F06.1] 01/12/2018  . Malnutrition of moderate degree [E44.0] 01/09/2018  . Pressure injury of skin [L89.90] 01/09/2018  . Severe recurrent major depression without psychotic features ([place]) [F33.2] 01/09/2018  . Back pain [M54.9] 01/08/2018  . Shuffling gait [R26.89] 01/08/2018  . Depression [F32.9] 01/08/2018   Total Time spent with patient: 30 minutes  Past Psychiatric History: History of long-standing depression  Past Medical History:  Past Medical History:  Diagnosis Date  . Depression     Past Surgical History:  Procedure Laterality Date  . HERNIA REPAIR     Family History: History reviewed. No pertinent family history. Family Psychiatric  History: See previous note Social History:  Social History   Substance and Sexual Activity  Alcohol Use Not on file     Social History   Substance and Sexual Activity  Drug Use Not Currently    Social History   Socioeconomic History  . Marital status: Married    Spouse name: Not on file  . Number of children: Not on file  . Years of education: Not on file  . Highest education level: Not on file  Occupational History  . Not on file  Social Needs  . Financial resource strain: Not on file  . Food insecurity:    Worry: Not on file    Inability: Not on file  . Transportation needs:    Medical: Not on file    Non-medical: Not on file  Tobacco Use  . Smoking status: Former Research scientist (life sciences)  .  Smokeless tobacco: Former Systems developer    Quit date: 01/10/2017  Substance and Sexual Activity  . Alcohol use: Not on file  . Drug use: Not Currently  . Sexual activity: Not Currently  Lifestyle  . Physical activity:    Days per week: Not on file    Minutes per session: Not on file  . Stress: Not on file  Relationships  . Social connections:    Talks on phone: Not on file    Gets together: Not on file    Attends religious service: Not on file    Active member of club or organization: Not on file    Attends meetings of clubs or organizations: Not on file    Relationship status: Not on file  Other Topics Concern  . Not on file  Social History Narrative  . Not on file   Additional Social History:                         Sleep: Fair  Appetite:  Fair  Current Medications: Current Facility-Administered Medications  Medication Dose Route Frequency Provider Last Rate Last Dose  . acetaminophen (TYLENOL) tablet 650 mg  650 mg Oral Q6H PRN Clapacs, Madie Reno, MD   650 mg at 01/14/18 2303  . alum & mag hydroxide-simeth (MAALOX/MYLANTA) 200-200-20 MG/5ML suspension 30 mL  30 mL Oral Q4H PRN Clapacs, John T, MD      . feeding supplement (ENSURE ENLIVE) (ENSURE ENLIVE)  liquid 237 mL  237 mL Oral BID BM Clapacs, John T, MD   237 mL at 01/29/18 1738  . Gerhardt's butt cream   Topical BID Clapacs, John T, MD      . ibuprofen (ADVIL,MOTRIN) tablet 400 mg  400 mg Oral Q6H PRN Clapacs, Madie Reno, MD   400 mg at 01/17/18 2055  . magnesium hydroxide (MILK OF MAGNESIA) suspension 30 mL  30 mL Oral Daily PRN Clapacs, Madie Reno, MD   30 mL at 01/16/18 0852  . mirtazapine (REMERON) tablet 45 mg  45 mg Oral QHS Clapacs, Madie Reno, MD   45 mg at 01/28/18 2157  . multivitamin with minerals tablet 1 tablet  1 tablet Oral Daily Clapacs, Madie Reno, MD   1 tablet at 01/29/18 1224  . ondansetron (ZOFRAN) injection 4 mg  4 mg Intravenous Once PRN Alvin Critchley, MD      . pantoprazole (PROTONIX) EC tablet 40 mg  40 mg Oral  Daily Clapacs, Madie Reno, MD   40 mg at 01/29/18 1224  . promethazine (PHENERGAN) injection 6.25-12.5 mg  6.25-12.5 mg Intravenous Q15 min PRN Arlyss Repress T, MD      . traZODone (DESYREL) tablet 100 mg  100 mg Oral QHS PRN Clapacs, Madie Reno, MD   100 mg at 01/28/18 2157    Lab Results:  Results for orders placed or performed during the hospital encounter of 01/12/18 (from the past 48 hour(s))  Glucose, capillary     Status: None   Collection Time: 01/29/18  6:37 AM  Result Value Ref Range   Glucose-Capillary 87 70 - 99 mg/dL   Comment 1 Notify RN     Blood Alcohol level:  Lab Results  Component Value Date   ETH <10 02/40/9735    Metabolic Disorder Labs: No results found for: HGBA1C, MPG No results found for: PROLACTIN No results found for: CHOL, TRIG, HDL, CHOLHDL, VLDL, LDLCALC  Physical Findings: AIMS: Facial and Oral Movements Muscles of Facial Expression: None, normal Lips and Perioral Area: None, normal Jaw: None, normal Tongue: None, normal,Extremity Movements Upper (arms, wrists, hands, fingers): None, normal Lower (legs, knees, ankles, toes): None, normal, Trunk Movements Neck, shoulders, hips: None, normal, Overall Severity Severity of abnormal movements (highest score from questions above): None, normal Incapacitation due to abnormal movements: None, normal Patient's awareness of abnormal movements (rate only patient's report): No Awareness, Dental Status Current problems with teeth and/or dentures?: No Does patient usually wear dentures?: No  CIWA:    COWS:     Musculoskeletal: Strength & Muscle Tone: within normal limits Gait & Station: normal Patient leans: N/A  Psychiatric Specialty Exam: Physical Exam  Nursing note and vitals reviewed. Constitutional: He appears well-developed and well-nourished.  HENT:  Head: Normocephalic and atraumatic.  Eyes: Pupils are equal, round, and reactive to light. Conjunctivae are normal.  Neck: Normal range of motion.   Cardiovascular: Regular rhythm and normal heart sounds.  Respiratory: Effort normal. No respiratory distress.  GI: Soft.  Musculoskeletal: Normal range of motion.  Neurological: He is alert.  Skin: Skin is warm and dry.  Psychiatric: Judgment and thought content normal. His affect is blunt. His speech is delayed. He is slowed. Cognition and memory are normal.    Review of Systems  Constitutional: Negative.   HENT: Negative.   Eyes: Negative.   Respiratory: Negative.   Cardiovascular: Negative.   Gastrointestinal: Negative.   Musculoskeletal: Negative.   Skin: Negative.   Neurological: Negative.   Psychiatric/Behavioral: Negative.  Blood pressure 138/68, pulse 72, temperature 98.2 F (36.8 C), resp. rate 16, height 5\' 10"  (1.778 m), weight 73.5 kg (162 lb), SpO2 100 %.Body mass index is 23.24 kg/m.  General Appearance: Fairly Groomed  Eye Contact:  Fair  Speech:  Slow  Volume:  Decreased  Mood:  Dysphoric  Affect:  Congruent  Thought Process:  Goal Directed  Orientation:  Full (Time, Place, and Person)  Thought Content:  Logical  Suicidal Thoughts:  No  Homicidal Thoughts:  No  Memory:  Immediate;   Fair Recent;   Fair Remote;   Fair  Judgement:  Fair  Insight:  Fair  Psychomotor Activity:  Decreased  Concentration:  Concentration: Fair  Recall:  [employer] of Knowledge:  Fair  Language:  Fair  Akathisia:  No  Handed:  Right  AIMS (if indicated):     Assets:  Desire for Improvement  ADL's:  Intact  Cognition:  WNL  Sleep:  Number of Hours: 7.5     Treatment Plan Summary: Daily contact with patient to assess and evaluate symptoms and progress in treatment, Medication management and Plan Patient is improving.  Still mentally sluggish and physically a little weak.  Continue ECT treatment.  Continue working with family about possible discharge options no change to medicine.  Alethia Berthold, MD 01/29/2018, 9:11 PM

## 2018-01-29 NOTE — Anesthesia Procedure Notes (Signed)
Performed by: Sky Borboa, CRNA Pre-anesthesia Checklist: Patient identified, Emergency Drugs available, Suction available and Patient being monitored Patient Re-evaluated:Patient Re-evaluated prior to induction Oxygen Delivery Method: Circle system utilized Preoxygenation: Pre-oxygenation with 100% oxygen Induction Type: IV induction Ventilation: Mask ventilation without difficulty and Mask ventilation throughout procedure Airway Equipment and Method: Bite block Placement Confirmation: positive ETCO2 Dental Injury: Teeth and Oropharynx as per pre-operative assessment        

## 2018-01-29 NOTE — Progress Notes (Signed)
Nursing note 7p-7a  Pt observed interacting with peers on unit this shift. Displayed a bright affect and pleasant mood upon interaction with this Probation officer. Pt denies pain ,denies SI/HI, and also denies any audio or visual hallucinations at this time. Pt complains of insomnia, Trazodone administered per MD order. See MAR for medication administration.  Pt is able to verbally contract for safety with this RN.  Pt is now resting in bed with eyes closed, with no signs or symptoms of pain or distress noted. Pt continues to remain safe on the unit and is observed by rounding every 15 min. RN will continue to monitor.

## 2018-01-29 NOTE — Plan of Care (Signed)
Patient is alert and oriented X 4, Patient denies SI, HI and AVH. Patient is more animated; present in the milieu and mobility has improved; steady when walking. Patient speech is logical and coherent; interaction is assertive; patient is pleasant. Patient received ECT today; patient came on the unit oriented; able to answer questions; and vitals within normal limits. Patient compliant with medications and eating meals appropriately. Patient is able to verbalize feelings and remain injury free. Nurse will continue to monitor. Problem: Activity: Goal: Interest or engagement in activities will improve Outcome: Progressing Goal: Sleeping patterns will improve Outcome: Progressing   Problem: Coping: Goal: Ability to verbalize frustrations and anger appropriately will improve Outcome: Progressing   Problem: Safety: Goal: Periods of time without injury will increase Outcome: Progressing   Problem: Education: Goal: Utilization of techniques to improve thought processes will improve Outcome: Progressing   Problem: Activity: Goal: Interest or engagement in leisure activities will improve Outcome: Progressing

## 2018-01-29 NOTE — Transfer of Care (Signed)
Immediate Anesthesia Transfer of Care Note  Patient: [name]  Procedure(s) Performed: ECT TX  Patient Location: PACU  Anesthesia Type:General  Level of Consciousness: sedated and responds to stimulation  Airway & Oxygen Therapy: Patient Spontanous Breathing and Patient connected to face mask oxygen  Post-op Assessment: Report given to RN and Post -op Vital signs reviewed and stable  Post vital signs: Reviewed and stable  Last Vitals:  Vitals Value Taken Time  BP 155/86 01/29/2018 10:48 AM  Temp    Pulse 57 01/29/2018 10:48 AM  Resp 11 01/29/2018 10:48 AM  SpO2 99 % 01/29/2018 10:48 AM  Vitals shown include unvalidated device data.  Last Pain:  Vitals:   01/28/18 1950  TempSrc:   PainSc: 0-No pain      Patients Stated Pain Goal: 0 (51/02/58 5277)  Complications: No apparent anesthesia complications

## 2018-01-29 NOTE — Progress Notes (Signed)
PT Cancellation Note  Patient Details Name: MARQUEZE RAMCHARAN MRN: [id number] DOB: 08-Oct-1955   Cancelled Treatment:     Chart reviewed. PT planned treatment session this morning however pt currently off unit for ECT treatment. Pt does much better with PT on non ECT treatment days. PT will attempt treatment on next date pt is medically appropriate.  Yolonda Kida, SPT   Naveah Brave 01/29/2018, 9:51 AM

## 2018-01-29 NOTE — Anesthesia Preprocedure Evaluation (Signed)
Anesthesia Evaluation  Patient identified by MRN, date of birth, ID band Patient awake    Reviewed: Allergy & Precautions, H&P , NPO status , reviewed documented beta blocker date and time   Airway Mallampati: II  TM Distance: >3 FB Neck ROM: full    Dental  (+) Chipped   Pulmonary former smoker,    Pulmonary exam normal        Cardiovascular Normal cardiovascular exam     Neuro/Psych PSYCHIATRIC DISORDERS Depression    GI/Hepatic   Endo/Other    Renal/GU      Musculoskeletal   Abdominal   Peds  Hematology   Anesthesia Other Findings Past Medical History: No date: Depression  Past Surgical History: No date: HERNIA REPAIR  BMI    Body Mass Index:  23.24 kg/m      Reproductive/Obstetrics                             Anesthesia Physical Anesthesia Plan  ASA: III  Anesthesia Plan: General   Post-op Pain Management:    Induction:   PONV Risk Score and Plan: 2 and Treatment may vary due to age or medical condition and TIVA  Airway Management Planned: Mask  Additional Equipment:   Intra-op Plan:   Post-operative Plan:   Informed Consent: I have reviewed the patients History and Physical, chart, labs and discussed the procedure including the risks, benefits and alternatives for the proposed anesthesia with the patient or authorized representative who has indicated his/her understanding and acceptance.   Dental Advisory Given  Plan Discussed with: CRNA  Anesthesia Plan Comments:         Anesthesia Quick Evaluation

## 2018-01-29 NOTE — Plan of Care (Signed)
  Problem: Activity: Goal: Interest or engagement in activities will improve Outcome: Progressing Goal: Sleeping patterns will improve Outcome: Progressing   Problem: Coping: Goal: Ability to verbalize frustrations and anger appropriately will improve Outcome: Progressing   Problem: Safety: Goal: Periods of time without injury will increase Outcome: Progressing   Problem: Education: Goal: Utilization of techniques to improve thought processes will improve Outcome: Progressing   Problem: Activity: Goal: Interest or engagement in leisure activities will improve Outcome: Progressing   Problem: Coping: Goal: Will verbalize feelings Outcome: Progressing   Problem: Safety: Goal: Ability to disclose and discuss suicidal ideas will improve Outcome: Progressing   Problem: Education: Goal: Knowledge of General Education information will improve Outcome: Progressing  Pt continues to progress towards goals and d/c. RN will continue to monitor.

## 2018-01-29 NOTE — H&P (Signed)
[name] is an 62 y.o. male.   Chief Complaint: Patient is reporting his mood feels better.  No new complaint HPI: History of long-standing depression  Past Medical History:  Diagnosis Date  . Depression     Past Surgical History:  Procedure Laterality Date  . HERNIA REPAIR      History reviewed. No pertinent family history. Social History:  reports that he has quit smoking. He quit smokeless tobacco use about 12 months ago. He reports that he has current or past drug history. His alcohol history is not on file.  Allergies: No Known Allergies  Medications Prior to Admission  Medication Sig Dispense Refill  . feeding supplement, ENSURE ENLIVE, (ENSURE ENLIVE) LIQD Take 237 mLs by mouth 2 (two) times daily between meals. 237 mL 12  . ibuprofen (ADVIL,MOTRIN) 600 MG tablet Take 1 tablet (600 mg total) by mouth every 6 (six) hours as needed for moderate pain. 30 tablet 0  . Multiple Vitamin (MULTIVITAMIN WITH MINERALS) TABS tablet Take 1 tablet by mouth daily. 30 tablet 0  . predniSONE (STERAPRED UNI-PAK 21 TAB) 10 MG (21) TBPK tablet Taper by 10 mg daily 21 tablet 0  . vitamin B-12 1000 MCG tablet Take 1 tablet (1,000 mcg total) by mouth daily. 30 tablet 0  . Vitamin D, Ergocalciferol, (DRISDOL) 50000 units CAPS capsule Take 1 capsule (50,000 Units total) by mouth every 7 (seven) days. 5 capsule 0    Results for orders placed or performed during the hospital encounter of 01/12/18 (from the past 48 hour(s))  Glucose, capillary     Status: None   Collection Time: 01/29/18  6:37 AM  Result Value Ref Range   Glucose-Capillary 87 70 - 99 mg/dL   Comment 1 Notify RN    No results found.  Review of Systems  Constitutional: Negative.   HENT: Negative.   Eyes: Negative.   Respiratory: Negative.   Cardiovascular: Negative.   Gastrointestinal: Negative.   Musculoskeletal: Negative.   Skin: Negative.   Neurological: Negative.   Psychiatric/Behavioral: Positive for memory loss.  Negative for depression, hallucinations, substance abuse and suicidal ideas. The patient is not nervous/anxious and does not have insomnia.     Blood pressure 111/73, pulse 65, temperature 98.3 F (36.8 C), resp. rate 16, height 5\' 10"  (1.778 m), weight 73.5 kg (162 lb), SpO2 98 %. Physical Exam  Nursing note and vitals reviewed. Constitutional: He appears well-developed and well-nourished.  HENT:  Head: Normocephalic and atraumatic.  Eyes: Pupils are equal, round, and reactive to light. Conjunctivae are normal.  Neck: Normal range of motion.  Cardiovascular: Regular rhythm and normal heart sounds.  Respiratory: Effort normal. No respiratory distress.  GI: Soft.  Musculoskeletal: Normal range of motion.  Neurological: He is alert.  Skin: Skin is warm and dry.  Psychiatric: Judgment normal. His affect is blunt. His speech is delayed. He is slowed. He expresses no homicidal and no suicidal ideation. He exhibits abnormal recent memory.     Assessment/Plan Follow-up Wednesday  Alethia Berthold, MD 01/29/2018, 10:31 AM

## 2018-01-29 NOTE — Procedures (Signed)
ECT SERVICES Physician's Interval Evaluation & Treatment Note  Patient Identification: [name] MRN:  [id number] Date of Evaluation:  01/29/2018 TX #: 7  MADRS: 41  MMSE: 30  P.E. Findings:  No change to physical exam  Psychiatric Interval Note:  Mood is reported as better energy level better.  Eating much better.  Subjective:  Patient is a 62 y.o. male seen for evaluation for Electroconvulsive Therapy. Patient acknowledges feeling a little bit better  Treatment Summary:   []   Right Unilateral             [x]  Bilateral   % Energy : 1.0 ms 100%   Impedance: 1650 ohms  Seizure Energy Index: No reading found but it looked like a robust seizure  Postictal Suppression Index: There was no reading at this  Seizure Concordance Index: No reading at this  Medications  Pre Shock: Brevital 70 mg succinylcholine 80 mg  Post Shock:    Seizure Duration: 20 seconds by EMG 46 seconds by EEG   Comments: Collier Salina did not do a good reading of what looks like a very clear-cut and robust seizure.  We will continue with Next treatment Wednesday  Lungs:  [x]   Clear to auscultation               []  Other:   Heart:    [x]   Regular rhythm             []  irregular rhythm    [x]   Previous H&P reviewed, patient examined and there are NO CHANGES                 []   Previous H&P reviewed, patient examined and there are changes noted.   Alethia Berthold, MD 7/22/201910:33 AM

## 2018-01-29 NOTE — Progress Notes (Signed)
Recreation Therapy Notes  Date: 01/29/2018  Time: 9:30 am   Location: Craft Room   Behavioral response: N/A   Intervention Topic: Creative Expressions  Discussion/Intervention: Patient did not attend group.   Clinical Observations/Feedback:  Patient did not attend group.   Ramesha Poster LRT/CTRS        [name] 01/29/2018 10:25 AM 

## 2018-01-29 NOTE — Progress Notes (Signed)
NPO @0000

## 2018-01-29 NOTE — Anesthesia Post-op Follow-up Note (Signed)
Anesthesia QCDR form completed.        

## 2018-01-30 ENCOUNTER — Other Ambulatory Visit: Payer: Self-pay | Admitting: Psychiatry

## 2018-01-30 NOTE — [facility] Group Notes (Signed)
[facility] Group Notes:  (Nursing/MHT/Case Management/Adjunct)  Date:  01/30/2018  Time:  2:53 PM  Type of Therapy:  Psychoeducational Skills  Participation Level:  Active  Participation Quality:  Appropriate, Attentive and Sharing  Affect:  Appropriate  Cognitive:  Alert and Appropriate  Insight:  Appropriate  Engagement in Group:  Engaged  Modes of Intervention:  Discussion, Education and Support  Summary of Progress/Problems:  Adela Lank [facility] 01/30/2018, 2:53 PM

## 2018-01-30 NOTE — [facility] Group Notes (Signed)
[facility] Group Notes:  (Nursing/MHT/Case Management/Adjunct)  Date:  01/30/2018  Time:  11:06 PM  Type of Therapy:  Group Therapy  Participation Level:  Active  Participation Quality:  Appropriate  Affect:  Appropriate  Cognitive:  Appropriate  Insight:  Appropriate  Engagement in Group:  Engaged  Modes of Intervention:  Discussion  Summary of Progress/Problems: Nivek stated his goal was to attend groups and have a good day. Wyn stated he accomplished his goal. MHT reviewed rules and expectations of unit. MHT encouraged patients to clean up after themselves. MHT informed patients not to take food back to the rooms. MHT informed patients of visitation and phone hours. MHT informed patients of vitals at 6am and encouraged everyone to get a good night sleep. MHT informed patients that techs would be looking in rooms routinely to perform checks and for everyone to keep covered up throughout the night. MHT covered topic of preventing and managing stress. MHT discussed identifying stressors in patient's life. MHT discussed the importance of relaxing and taking time for self. MHT discussed self-awareness and preparing for the things that cause stress. MHT provided examples of how to deal with the stressors of hearing voices. MHT discussed taking medications as prescribed. Group discussion on strategies to help with hearing voices. Things to try brought up in group were using head phones, using ear buds, cotton swabs, exercising, and talking to doctor if medications were not effective. MHT discussed the signs of stress and informed of burn out.  Barnie Mort 01/30/2018, 11:06 PM

## 2018-01-30 NOTE — Plan of Care (Signed)
Patient is alert and oriented X 4; denies SI, HI and AVH. Patient is pleasant to talk with ,and animated; denies any pain. Patient eating meals; interacting appropriately with peers and attending groups. Patient states he is doing much better. Nurses will continue to monitor. Problem: Activity: Goal: Interest or engagement in activities will improve Outcome: Progressing Goal: Sleeping patterns will improve Outcome: Progressing   Problem: Coping: Goal: Ability to verbalize frustrations and anger appropriately will improve Outcome: Progressing   Problem: Safety: Goal: Periods of time without injury will increase Outcome: Progressing   Problem: Education: Goal: Utilization of techniques to improve thought processes will improve Outcome: Progressing

## 2018-01-30 NOTE — Plan of Care (Signed)
Pleasant on approach, improved appearance, ambulating around without assistive device, speech clear and makes sense; thoughts logical, coherent, contents are relevant, denied pain, denied SI/HI/AVH. Complied with medications as prescribed.  Patient slept for Estimated Hours of 7; Precautionary checks every 15 minutes for safety maintained, room free of safety hazards, patient sustains no injury or falls during this shift.  Problem: Activity: Goal: Interest or engagement in activities will improve Outcome: Progressing Goal: Sleeping patterns will improve Outcome: Progressing   Problem: Safety: Goal: Periods of time without injury will increase Outcome: Progressing   Problem: Coping: Goal: Will verbalize feelings Outcome: Progressing

## 2018-01-30 NOTE — Progress Notes (Signed)
Physical Therapy Treatment Patient Details Name: [name] MRN: [id number] DOB: 07/12/1955 Today's Date: 01/30/2018    History of Present Illness Per grandson (who pt lives with) he has been unmotivated to do anything over the last several months and has hardly done much more than get out of bed over the last several weeks. Pt diagnosed with severe recurrent depression without psychiatric features. Pt recently transfered to behavior med. New PT eval completed on 01/12/18. Pt began ECT treatments 3x/week beginning 01/12/18    PT Comments    Pt seen this morning and states that he is doing much better and is much more interactive this visit. Pt still has flat affect and is soft spoken however holds conversation with PT throughout session. Pt instructed in gait training for 600' and no AD, pt instructed in head turns as well as change in speed and direction during gait. Pt also instructed in and performs standing balance exercises including single leg stance, tandem and rhomberg stance. At this time pt has met goals and appears to be functioning at [facility]. PT plans to d/c pt in house at this time. If further PT needs arise please indicate so through new order.   Follow Up Recommendations  No PT follow up     Equipment Recommendations  None recommended by PT    Recommendations for Other Services       Precautions / Restrictions Precautions Precautions: Fall Restrictions Weight Bearing Restrictions: No    Mobility  Bed Mobility Overal bed mobility: Independent Bed Mobility: Supine to Sit     Supine to sit: Independent     General bed mobility comments: pt performs supine>sit safely and independently in an appropriate amount of time with appropriate UE support.  Transfers Overall transfer level: Independent Equipment used: None Transfers: Sit to/from Stand Sit to Stand: Independent         General transfer comment: pt performs sit<>stand independently without AD. Able to  maintain upright posture without AD or physical assistance.  Ambulation/Gait Ambulation/Gait assistance: Supervision Gait Distance (Feet): 600 Feet Assistive device: None Gait Pattern/deviations: Step-through pattern;Decreased step length - right;Decreased step length - left Gait velocity: not formally tested, slow indicitive of household ambulator or limited community ambulator   General Gait Details: decreased step length and height with verbal cues to stand up straight.  hands in pockets. Pt amb 600' total with supervision and does not appear to fatigue througout.   Stairs             Wheelchair Mobility    Modified Rankin (Stroke Patients Only)       Balance                                            Cognition Arousal/Alertness: Awake/alert Behavior During Therapy: WFL for tasks assessed/performed Overall Cognitive Status: Within Functional Limits for tasks assessed                                 General Comments: pt still demonstrates flat affect and is soft spoken however is carries on much more converstation throughout session and appears much more upbeat      Exercises Other Exercises Other Exercises: pt instructed in gait training, pt amb 600' total with verbal cuing to increase arm swing. Pt instructed to and able to perform  head turns, change in speed and change in direction while ambulating and holding conversation. Pt instructed in and able to perform standing balance exercises including tandem stance, rhomberg stance, and each single leg stance able to maintain each with CGA for 15 seconds each.    General Comments        Pertinent Vitals/Pain Pain Assessment: No/denies pain Pain Score: 0-No pain Pain Intervention(s): Monitored during session    Home Living                      Prior Function            PT Goals (current goals can now be found in the care plan section) Acute Rehab PT Goals Patient  Stated Goal: pt states that he would like to continue to feel better/improving PT Goal Formulation: With patient Time For Goal Achievement: 01/26/18 Potential to Achieve Goals: Good Progress towards PT goals: Goals met/education completed, patient discharged from PT    Frequency    Min 2X/week      PT Plan Discharge plan needs to be updated    Co-evaluation              AM-PAC PT "6 Clicks" Daily Activity  Outcome Measure  Difficulty turning over in bed (including adjusting bedclothes, sheets and blankets)?: None Difficulty moving from lying on back to sitting on the side of the bed? : None Difficulty sitting down on and standing up from a chair with arms (e.g., wheelchair, bedside commode, etc,.)?: None Help needed moving to and from a bed to chair (including a wheelchair)?: None Help needed walking in hospital room?: None Help needed climbing 3-5 steps with a railing? : A Little 6 Click Score: 23    End of Session Equipment Utilized During Treatment: Gait belt Activity Tolerance: Patient tolerated treatment well Patient left: in bed   PT Visit Diagnosis: Muscle weakness (generalized) (M62.81);Difficulty in walking, not elsewhere classified (R26.2);Unsteadiness on feet (R26.81);Other abnormalities of gait and mobility (R26.89)     Time: 8882-8003 PT Time Calculation (min) (ACUTE ONLY): 12 min  Charges:                       G Codes:       Elizbeth Posa, SPT    Tajh Livsey 01/30/2018, 11:56 AM

## 2018-01-30 NOTE — [facility] Group Notes (Signed)
01/30/2018 1PM  Type of Therapy/Topic:  Group Therapy:  Feelings about Diagnosis  Participation Level:  Did Not Attend   Description of Group:   This group will allow patients to explore their thoughts and feelings about diagnoses they have received. Patients will be guided to explore their level of understanding and acceptance of these diagnoses. Facilitator will encourage patients to process their thoughts and feelings about the reactions of others to their diagnosis and will guide patients in identifying ways to discuss their diagnosis with significant others in their lives. This group will be process-oriented, with patients participating in exploration of their own experiences, giving and receiving support, and processing challenge from other group members.   Therapeutic Goals: 1. Patient will demonstrate understanding of diagnosis as evidenced by identifying two or more symptoms of the disorder 2. Patient will be able to express two feelings regarding the diagnosis 3. Patient will demonstrate their ability to communicate their needs through discussion and/or role play  Summary of Patient Progress: Patient was encouraged and invited to attend group. Patient did not attend group. Social worker will continue to encourage group participation in the future.        Therapeutic Modalities:   Cognitive Behavioral Therapy Brief Therapy Feelings Identification    Darin Engels, Marlinda Mike 01/30/2018 2:46 PM

## 2018-01-30 NOTE — Progress Notes (Signed)
Recreation Therapy Notes  Date: 01/30/2018  Time: 9:30 am   Location: Craft Room   Behavioral response: N/A   Intervention Topic: Values  Discussion/Intervention: Patient did not attend group.   Clinical Observations/Feedback:  Patient did not attend group.   Efraim Vanallen LRT/CTRS        [place] 01/30/2018 10:31 AM

## 2018-01-30 NOTE — [facility] Group Notes (Signed)
CSW Group Therapy Note  01/30/2018  Time:  0900  Type of Therapy and Topic: Group Therapy: Goals Group: SMART Goals    Participation Level:  Active    Description of Group:   The purpose of a daily goals group is to assist and guide patients in setting recovery/wellness-related goals. The objective is to set goals as they relate to the crisis in which they were admitted. Patients will be using SMART goal modalities to set measurable goals. Characteristics of realistic goals will be discussed and patients will be assisted in setting and processing how one will reach their goal. Facilitator will also assist patients in applying interventions and coping skills learned in psycho-education groups to the SMART goal and process how one will achieve defined goal.    Therapeutic Goals:  -Patients will develop and document one goal related to or their crisis in which brought them into treatment.  -Patients will be guided by LCSW using SMART goal setting modality in how to set a measurable, attainable, realistic and time sensitive goal.  -Patients will process barriers in reaching goal.  -Patients will process interventions in how to overcome and successful in reaching goal.    Patient's Goal:  Pt continues to work towards their tx goals but has not yet reached them. Pt was able to appropriately participate in group discussion, and was able to offer support/validation to other group members. Pt reported his goal for the day is, "to be more active by speaking to at least one new person by the end of today."   Therapeutic Modalities:  Motivational Interviewing  Cognitive Behavioral Therapy  Crisis Intervention Model  SMART goals setting  Alden Hipp, MSW, LCSW Clinical Social Worker 01/30/2018 9:54 AM

## 2018-01-30 NOTE — Tx Team (Signed)
Interdisciplinary Treatment and Diagnostic Plan Update  01/29/2018 Time of Session: reviewed by phone with Physician Chamberlain Steinborn Advincula MRN: [id number]  Principal Diagnosis: Severe recurrent major depression without psychotic features ([place])  Secondary Diagnoses: Principal Problem:   Severe recurrent major depression without psychotic features ([place]) Active Problems:   Catatonia   Current Medications:  Current Facility-Administered Medications  Medication Dose Route Frequency Provider Last Rate Last Dose  . acetaminophen (TYLENOL) tablet 650 mg  650 mg Oral Q6H PRN Clapacs, Madie Reno, MD   650 mg at 01/14/18 2303  . alum & mag hydroxide-simeth (MAALOX/MYLANTA) 200-200-20 MG/5ML suspension 30 mL  30 mL Oral Q4H PRN Clapacs, John T, MD      . feeding supplement (ENSURE ENLIVE) (ENSURE ENLIVE) liquid 237 mL  237 mL Oral BID BM Clapacs, John T, MD   237 mL at 01/29/18 1738  . Gerhardt's butt cream   Topical BID Clapacs, John T, MD      . ibuprofen (ADVIL,MOTRIN) tablet 400 mg  400 mg Oral Q6H PRN Clapacs, Madie Reno, MD   400 mg at 01/17/18 2055  . magnesium hydroxide (MILK OF MAGNESIA) suspension 30 mL  30 mL Oral Daily PRN Clapacs, Madie Reno, MD   30 mL at 01/16/18 0852  . mirtazapine (REMERON) tablet 45 mg  45 mg Oral QHS Clapacs, Madie Reno, MD   45 mg at 01/29/18 2133  . multivitamin with minerals tablet 1 tablet  1 tablet Oral Daily Clapacs, Madie Reno, MD   1 tablet at 01/30/18 0758  . ondansetron (ZOFRAN) injection 4 mg  4 mg Intravenous Once PRN Alvin Critchley, MD      . pantoprazole (PROTONIX) EC tablet 40 mg  40 mg Oral Daily Clapacs, Madie Reno, MD   40 mg at 01/30/18 0758  . promethazine (PHENERGAN) injection 6.25-12.5 mg  6.25-12.5 mg Intravenous Q15 min PRN Arlyss Repress T, MD      . traZODone (DESYREL) tablet 100 mg  100 mg Oral QHS PRN Clapacs, Madie Reno, MD   100 mg at 01/28/18 2157   PTA Medications: Medications Prior to Admission  Medication Sig Dispense Refill Last Dose  . feeding supplement,  ENSURE ENLIVE, (ENSURE ENLIVE) LIQD Take 237 mLs by mouth 2 (two) times daily between meals. 237 mL 12 01/23/2018  . ibuprofen (ADVIL,MOTRIN) 600 MG tablet Take 1 tablet (600 mg total) by mouth every 6 (six) hours as needed for moderate pain. 30 tablet 0 01/23/2018  . Multiple Vitamin (MULTIVITAMIN WITH MINERALS) TABS tablet Take 1 tablet by mouth daily. 30 tablet 0 01/23/2018  . predniSONE (STERAPRED UNI-PAK 21 TAB) 10 MG (21) TBPK tablet Taper by 10 mg daily 21 tablet 0 01/23/2018  . vitamin B-12 1000 MCG tablet Take 1 tablet (1,000 mcg total) by mouth daily. 30 tablet 0 01/23/2018  . Vitamin D, Ergocalciferol, (DRISDOL) 50000 units CAPS capsule Take 1 capsule (50,000 Units total) by mouth every 7 (seven) days. 5 capsule 0 01/23/2018    Patient Stressors:    Patient Strengths:    Treatment Modalities: Medication Management, Group therapy, Case management,  1 to 1 session with clinician, Psychoeducation, Recreational therapy.   Physician Treatment Plan for Primary Diagnosis: Severe recurrent major depression without psychotic features ([place]) Long Term Goal(s): Improvement in symptoms so as ready for discharge Improvement in symptoms so as ready for discharge   Short Term Goals: Ability to demonstrate self-control will improve Ability to identify and develop effective coping behaviors will improve Ability to maintain clinical measurements within  normal limits will improve Compliance with prescribed medications will improve  Medication Management: Evaluate patient's response, side effects, and tolerance of medication regimen.  Therapeutic Interventions: 1 to 1 sessions, Unit Group sessions and Medication administration.  Evaluation of Outcomes: Progressing  Physician Treatment Plan for Secondary Diagnosis: Principal Problem:   Severe recurrent major depression without psychotic features ([place]) Active Problems:   Catatonia  Long Term Goal(s): Improvement in symptoms so as ready for  discharge Improvement in symptoms so as ready for discharge   Short Term Goals: Ability to demonstrate self-control will improve Ability to identify and develop effective coping behaviors will improve Ability to maintain clinical measurements within normal limits will improve Compliance with prescribed medications will improve     Medication Management: Evaluate patient's response, side effects, and tolerance of medication regimen.  Therapeutic Interventions: 1 to 1 sessions, Unit Group sessions and Medication administration.  Evaluation of Outcomes: Progressing   RN Treatment Plan for Primary Diagnosis: Severe recurrent major depression without psychotic features ([place]) Long Term Goal(s): Knowledge of disease and therapeutic regimen to maintain health will improve  Short Term Goals: Ability to identify and develop effective coping behaviors will improve and Compliance with prescribed medications will improve  Medication Management: RN will administer medications as ordered by provider, will assess and evaluate patient's response and provide education to patient for prescribed medication. RN will report any adverse and/or side effects to prescribing provider.  Therapeutic Interventions: 1 on 1 counseling sessions, Psychoeducation, Medication administration, Evaluate responses to treatment, Monitor vital signs and CBGs as ordered, Perform/monitor CIWA, COWS, AIMS and Fall Risk screenings as ordered, Perform wound care treatments as ordered.  Evaluation of Outcomes: Progressing   LCSW Treatment Plan for Primary Diagnosis: Severe recurrent major depression without psychotic features ([place]) Long Term Goal(s): Safe transition to appropriate next level of care at discharge, Engage patient in therapeutic group addressing interpersonal concerns.  Short Term Goals: Engage patient in aftercare planning with referrals and resources, Identify triggers associated with mental health/substance abuse  issues and Increase skills for wellness and recovery  Therapeutic Interventions: Assess for all discharge needs, 1 to 1 time with Social worker, Explore available resources and support systems, Assess for adequacy in community support network, Educate family and significant other(s) on suicide prevention, Complete Psychosocial Assessment, Interpersonal group therapy.  Evaluation of Outcomes: Progressing   Progress in Treatment: Attending groups: No. Participating in groups: No. Taking medication as prescribed: Yes. Toleration medication: Yes. Family/Significant other contact made: Yes, individual(s) contacted:  grandson [place] Patient understands diagnosis: Yes. Discussing patient identified problems/goals with staff: Yes. Medical problems stabilized or resolved: Yes. Denies suicidal/homicidal ideation: No. Issues/concerns per patient self-inventory: No. Other:    New problem(s) identified: No, Describe:     New Short Term/Long Term Goal(s):  Patient Goals:  To feel better, have more energy to take car of himself more, be more active again  Discharge Plan or Barriers: ECT treatment in hosptial, possibility of outpatient ECT follow up TBD.  Discharge home to family with outpatient follow up medication management.  Pt has progressed, up walking better with walker, coming to group occasionally, improved affect and appears to be showering more consistently.  Reason for Continuation of Hospitalization: Continued ECT, Medication management, Coordination of Aftercare.  Estimated Length of Stay: 5-7 days  Recreational Therapy: Patient Stressors: N/A Patient Goal: Patient will engage in groups without prompting or encouragement from LRT x3 group sessions within 5 recreation therapy group sessions  Attendees: Patient: 01/30/2018 9:40 AM  Physician: Dr.  Clapacs 01/30/2018 9:40 AM  Nursing:  01/30/2018 9:40 AM  RN Care Manager: 01/30/2018 9:40 AM  Social Worker: Dossie Arbour, LCSW 01/30/2018  9:40 AM  Recreational Therapist:  01/30/2018 9:40 AM  Other:  01/30/2018 9:40 AM  Other:  01/30/2018 9:40 AM  Other: 01/30/2018 9:40 AM    Scribe for Treatment Team: August Saucer, LCSW 01/30/2018 9:40 AM

## 2018-01-30 NOTE — Progress Notes (Signed)
[facility] MD Progress Note  01/30/2018 10:01 PM [name]  MRN:  [id number] Subjective: Follow-up for this patient with major depression receiving ECT treatment.  Patient is up out of bed keeping himself well groomed eating well.  Still sluggish and slow but denies feeling depressed Principal Problem: Severe recurrent major depression without psychotic features ([place]) Diagnosis:   Patient Active Problem List   Diagnosis Date Noted  . Catatonia [F06.1] 01/12/2018  . Malnutrition of moderate degree [E44.0] 01/09/2018  . Pressure injury of skin [L89.90] 01/09/2018  . Severe recurrent major depression without psychotic features ([place]) [F33.2] 01/09/2018  . Back pain [M54.9] 01/08/2018  . Shuffling gait [R26.89] 01/08/2018  . Depression [F32.9] 01/08/2018   Total Time spent with patient: 20 minutes  Past Psychiatric History: History of long-standing depression  Past Medical History:  Past Medical History:  Diagnosis Date  . Depression     Past Surgical History:  Procedure Laterality Date  . HERNIA REPAIR     Family History: History reviewed. No pertinent family history. Family Psychiatric  History: See previous note Social History:  Social History   Substance and Sexual Activity  Alcohol Use Not on file     Social History   Substance and Sexual Activity  Drug Use Not Currently    Social History   Socioeconomic History  . Marital status: Married    Spouse name: Not on file  . Number of children: Not on file  . Years of education: Not on file  . Highest education level: Not on file  Occupational History  . Not on file  Social Needs  . Financial resource strain: Not on file  . Food insecurity:    Worry: Not on file    Inability: Not on file  . Transportation needs:    Medical: Not on file    Non-medical: Not on file  Tobacco Use  . Smoking status: Former Research scientist (life sciences)  . Smokeless tobacco: Former Systems developer    Quit date: 01/10/2017  Substance and Sexual Activity  . Alcohol  use: Not on file  . Drug use: Not Currently  . Sexual activity: Not Currently  Lifestyle  . Physical activity:    Days per week: Not on file    Minutes per session: Not on file  . Stress: Not on file  Relationships  . Social connections:    Talks on phone: Not on file    Gets together: Not on file    Attends religious service: Not on file    Active member of club or organization: Not on file    Attends meetings of clubs or organizations: Not on file    Relationship status: Not on file  Other Topics Concern  . Not on file  Social History Narrative  . Not on file   Additional Social History:                         Sleep: Fair  Appetite:  Fair  Current Medications: Current Facility-Administered Medications  Medication Dose Route Frequency Provider Last Rate Last Dose  . acetaminophen (TYLENOL) tablet 650 mg  650 mg Oral Q6H PRN Michoel Kunin, Madie Reno, MD   650 mg at 01/14/18 2303  . alum & mag hydroxide-simeth (MAALOX/MYLANTA) 200-200-20 MG/5ML suspension 30 mL  30 mL Oral Q4H PRN Kimba Lottes T, MD      . feeding supplement (ENSURE ENLIVE) (ENSURE ENLIVE) liquid 237 mL  237 mL Oral BID BM Kalai Baca, Madie Reno, MD  237 mL at 01/29/18 1738  . Gerhardt's butt cream   Topical BID Percival Glasheen T, MD      . ibuprofen (ADVIL,MOTRIN) tablet 400 mg  400 mg Oral Q6H PRN Donisha Hoch, Madie Reno, MD   400 mg at 01/17/18 2055  . magnesium hydroxide (MILK OF MAGNESIA) suspension 30 mL  30 mL Oral Daily PRN Breton Berns, Madie Reno, MD   30 mL at 01/16/18 0852  . mirtazapine (REMERON) tablet 45 mg  45 mg Oral QHS Adriel Kessen, Madie Reno, MD   45 mg at 01/30/18 2144  . multivitamin with minerals tablet 1 tablet  1 tablet Oral Daily Maggie Dworkin, Madie Reno, MD   1 tablet at 01/30/18 0758  . ondansetron (ZOFRAN) injection 4 mg  4 mg Intravenous Once PRN Alvin Critchley, MD      . pantoprazole (PROTONIX) EC tablet 40 mg  40 mg Oral Daily Takashi Korol, Madie Reno, MD   40 mg at 01/30/18 0758  . promethazine (PHENERGAN) injection 6.25-12.5  mg  6.25-12.5 mg Intravenous Q15 min PRN Arlyss Repress T, MD      . traZODone (DESYREL) tablet 100 mg  100 mg Oral QHS PRN Shalin Linders, Madie Reno, MD   100 mg at 01/28/18 2157    Lab Results:  Results for orders placed or performed during the hospital encounter of 01/12/18 (from the past 48 hour(s))  Glucose, capillary     Status: None   Collection Time: 01/29/18  6:37 AM  Result Value Ref Range   Glucose-Capillary 87 70 - 99 mg/dL   Comment 1 Notify RN     Blood Alcohol level:  Lab Results  Component Value Date   ETH <10 21/30/8657    Metabolic Disorder Labs: No results found for: HGBA1C, MPG No results found for: PROLACTIN No results found for: CHOL, TRIG, HDL, CHOLHDL, VLDL, LDLCALC  Physical Findings: AIMS: Facial and Oral Movements Muscles of Facial Expression: None, normal Lips and Perioral Area: None, normal Jaw: None, normal Tongue: None, normal,Extremity Movements Upper (arms, wrists, hands, fingers): None, normal Lower (legs, knees, ankles, toes): None, normal, Trunk Movements Neck, shoulders, hips: None, normal, Overall Severity Severity of abnormal movements (highest score from questions above): None, normal Incapacitation due to abnormal movements: None, normal Patient's awareness of abnormal movements (rate only patient's report): No Awareness, Dental Status Current problems with teeth and/or dentures?: No Does patient usually wear dentures?: No  CIWA:    COWS:     Musculoskeletal: Strength & Muscle Tone: within normal limits Gait & Station: normal Patient leans: N/A  Psychiatric Specialty Exam: Physical Exam  Nursing note and vitals reviewed. Constitutional: He appears well-developed and well-nourished.  HENT:  Head: Normocephalic and atraumatic.  Eyes: Pupils are equal, round, and reactive to light. Conjunctivae are normal.  Neck: Normal range of motion.  Cardiovascular: Regular rhythm and normal heart sounds.  Respiratory: Effort normal. No respiratory  distress.  GI: Soft.  Musculoskeletal: Normal range of motion.  Neurological: He is alert.  Skin: Skin is warm and dry.  Psychiatric: He has a normal mood and affect. His behavior is normal. Judgment and thought content normal.    Review of Systems  Constitutional: Negative.   HENT: Negative.   Eyes: Negative.   Respiratory: Negative.   Cardiovascular: Negative.   Gastrointestinal: Negative.   Musculoskeletal: Negative.   Skin: Negative.   Neurological: Negative.   Psychiatric/Behavioral: Negative.     Blood pressure 120/80, pulse 66, temperature 98.1 F (36.7 C), temperature source Oral, resp. rate 18, height  5\' 10"  (1.778 m), weight 73.5 kg (162 lb), SpO2 98 %.Body mass index is 23.24 kg/m.  General Appearance: Casual  Eye Contact:  Fair  Speech:  Clear and Coherent  Volume:  Normal  Mood:  Euthymic  Affect:  Constricted  Thought Process:  Goal Directed  Orientation:  Full (Time, Place, and Person)  Thought Content:  Logical  Suicidal Thoughts:  No  Homicidal Thoughts:  No  Memory:  Immediate;   Fair Recent;   Fair Remote;   Fair  Judgement:  Impaired  Insight:  Good  Psychomotor Activity:  Decreased  Concentration:  Concentration: Fair  Recall:  [employer] of Knowledge:  Fair  Language:  Fair  Akathisia:  No  Handed:  Right  AIMS (if indicated):     Assets:  Desire for Improvement Housing Physical Health  ADL's:  Intact  Cognition:  WNL  Sleep:  Number of Hours: 7     Treatment Plan Summary: Daily contact with patient to assess and evaluate symptoms and progress in treatment, Medication management and Plan Definitely doing better.  I think we may be able to hope for discharge by the end of the week.  No change to medicine.  Next ECT treatment tomorrow  Alethia Berthold, MD 01/30/2018, 10:01 PM

## 2018-01-31 ENCOUNTER — Inpatient Hospital Stay: Payer: BLUE CROSS/BLUE SHIELD | Admitting: Anesthesiology

## 2018-01-31 ENCOUNTER — Inpatient Hospital Stay: Payer: BLUE CROSS/BLUE SHIELD

## 2018-01-31 LAB — GLUCOSE, CAPILLARY: Glucose-Capillary: 88 mg/dL (ref 70–99)

## 2018-01-31 MED ORDER — SODIUM CHLORIDE 0.9 % IV SOLN
INTRAVENOUS | Status: DC | PRN
  Administered 2018-01-31: 11:00:00 via INTRAVENOUS

## 2018-01-31 MED ORDER — SODIUM CHLORIDE 0.9 % IV SOLN
500.0000 mL | Freq: Once | INTRAVENOUS | Status: AC
  Administered 2018-01-31: 500 mL via INTRAVENOUS

## 2018-01-31 MED ORDER — KETAMINE HCL 10 MG/ML IJ SOLN
INTRAMUSCULAR | Status: DC | PRN
  Administered 2018-01-31: 100 mg via INTRAVENOUS

## 2018-01-31 MED ORDER — SUCCINYLCHOLINE CHLORIDE 20 MG/ML IJ SOLN
INTRAMUSCULAR | Status: DC | PRN
  Administered 2018-01-31: 80 mg via INTRAVENOUS

## 2018-01-31 MED ORDER — KETAMINE HCL 50 MG/ML IJ SOLN
INTRAMUSCULAR | Status: AC
  Filled 2018-01-31: qty 10

## 2018-01-31 MED ORDER — SUCCINYLCHOLINE CHLORIDE 20 MG/ML IJ SOLN
INTRAMUSCULAR | Status: AC
  Filled 2018-01-31: qty 1

## 2018-01-31 NOTE — Anesthesia Preprocedure Evaluation (Signed)
Anesthesia Evaluation  Patient identified by MRN, date of birth, ID band Patient awake    Reviewed: Allergy & Precautions, NPO status , Patient's Chart, lab work & pertinent test results  History of Anesthesia Complications Negative for: history of anesthetic complications  Airway Mallampati: II  TM Distance: >3 FB Neck ROM: Full    Dental no notable dental hx.    Pulmonary neg sleep apnea, neg COPD, former smoker,    breath sounds clear to auscultation       Cardiovascular (-) hypertension(-) Past MI and (-) CHF (-) dysrhythmias (-) Valvular Problems/Murmurs Rhythm:Regular Rate:Normal     Neuro/Psych neg Seizures PSYCHIATRIC DISORDERS Depression    GI/Hepatic Neg liver ROS, neg GERD  ,  Endo/Other  neg diabetes  Renal/GU negative Renal ROS     Musculoskeletal   Abdominal   Peds  Hematology   Anesthesia Other Findings Past Medical History: No date: Depression  BMI    Body Mass Index:  23.24 kg/m      Reproductive/Obstetrics                             Anesthesia Physical  Anesthesia Plan  ASA: II  Anesthesia Plan: General   Post-op Pain Management:    Induction: Intravenous  PONV Risk Score and Plan: 2 and TIVA  Airway Management Planned: Mask and Natural Airway  Additional Equipment:   Intra-op Plan:   Post-operative Plan:   Informed Consent: I have reviewed the patients History and Physical, chart, labs and discussed the procedure including the risks, benefits and alternatives for the proposed anesthesia with the patient or authorized representative who has indicated his/her understanding and acceptance.   Dental Advisory Given  Plan Discussed with: Anesthesiologist, CRNA and Surgeon  Anesthesia Plan Comments: (Patient consented for risks of anesthesia including but not limited to:  - adverse reactions to medications - risk of intubation if required - damage to  teeth, lips or other oral mucosa - sore throat or hoarseness - Damage to heart, brain, lungs or loss of life  Patient voiced understanding.)        Anesthesia Quick Evaluation

## 2018-01-31 NOTE — Plan of Care (Signed)
Affect constricted.  When asked how is your depression states "I don't know what to say."  Denies SI/HI/AVH.  Visible in the milieu.  No initiation of engagement.  Support offered. Safety rounds maintained ECT today.  Tolerated well.  Re=oriented to date and nurses intials.

## 2018-01-31 NOTE — Progress Notes (Signed)
[facility] MD Progress Note  01/31/2018 6:16 PM [name]  MRN:  [id number] Subjective: Follow-up for patient with severe depression.  Had ECT again today which as usual was tolerated without difficulty.  We did have one complication that the patient had a area of red swelling with a very small amount of bleeding over 1 of the temple sites.  Unclear if this is a burn or what the etiology of this was.  Patient generally continues to report that his mood is feeling better.  Still little sluggish and slow especially mentally.  Smiling more frequently though. Principal Problem: Severe recurrent major depression without psychotic features ([place]) Diagnosis:   Patient Active Problem List   Diagnosis Date Noted  . Catatonia [F06.1] 01/12/2018  . Malnutrition of moderate degree [E44.0] 01/09/2018  . Pressure injury of skin [L89.90] 01/09/2018  . Severe recurrent major depression without psychotic features ([place]) [F33.2] 01/09/2018  . Back pain [M54.9] 01/08/2018  . Shuffling gait [R26.89] 01/08/2018  . Depression [F32.9] 01/08/2018   Total Time spent with patient: 30 minutes  Past Psychiatric History: Long-standing depression see previous notes  Past Medical History:  Past Medical History:  Diagnosis Date  . Depression     Past Surgical History:  Procedure Laterality Date  . HERNIA REPAIR     Family History: History reviewed. No pertinent family history. Family Psychiatric  History: See previous notes Social History:  Social History   Substance and Sexual Activity  Alcohol Use Not on file     Social History   Substance and Sexual Activity  Drug Use Not Currently    Social History   Socioeconomic History  . Marital status: Married    Spouse name: Not on file  . Number of children: Not on file  . Years of education: Not on file  . Highest education level: Not on file  Occupational History  . Not on file  Social Needs  . Financial resource strain: Not on file  . Food  insecurity:    Worry: Not on file    Inability: Not on file  . Transportation needs:    Medical: Not on file    Non-medical: Not on file  Tobacco Use  . Smoking status: Former Research scientist (life sciences)  . Smokeless tobacco: Former Systems developer    Quit date: 01/10/2017  Substance and Sexual Activity  . Alcohol use: Not on file  . Drug use: Not Currently  . Sexual activity: Not Currently  Lifestyle  . Physical activity:    Days per week: Not on file    Minutes per session: Not on file  . Stress: Not on file  Relationships  . Social connections:    Talks on phone: Not on file    Gets together: Not on file    Attends religious service: Not on file    Active member of club or organization: Not on file    Attends meetings of clubs or organizations: Not on file    Relationship status: Not on file  Other Topics Concern  . Not on file  Social History Narrative  . Not on file   Additional Social History:                         Sleep: Fair  Appetite:  Fair  Current Medications: Current Facility-Administered Medications  Medication Dose Route Frequency Provider Last Rate Last Dose  . acetaminophen (TYLENOL) tablet 650 mg  650 mg Oral Q6H PRN Clapacs, Madie Reno, MD  650 mg at 01/14/18 2303  . alum & mag hydroxide-simeth (MAALOX/MYLANTA) 200-200-20 MG/5ML suspension 30 mL  30 mL Oral Q4H PRN Clapacs, John T, MD      . feeding supplement (ENSURE ENLIVE) (ENSURE ENLIVE) liquid 237 mL  237 mL Oral BID BM Clapacs, John T, MD   237 mL at 01/31/18 1746  . Gerhardt's butt cream   Topical BID Clapacs, John T, MD      . ibuprofen (ADVIL,MOTRIN) tablet 400 mg  400 mg Oral Q6H PRN Clapacs, Madie Reno, MD   400 mg at 01/17/18 2055  . magnesium hydroxide (MILK OF MAGNESIA) suspension 30 mL  30 mL Oral Daily PRN Clapacs, Madie Reno, MD   30 mL at 01/16/18 0852  . mirtazapine (REMERON) tablet 45 mg  45 mg Oral QHS Clapacs, Madie Reno, MD   45 mg at 01/30/18 2144  . multivitamin with minerals tablet 1 tablet  1 tablet Oral Daily  Clapacs, Madie Reno, MD   1 tablet at 01/31/18 1342  . ondansetron (ZOFRAN) injection 4 mg  4 mg Intravenous Once PRN Alvin Critchley, MD      . pantoprazole (PROTONIX) EC tablet 40 mg  40 mg Oral Daily Clapacs, Madie Reno, MD   40 mg at 01/31/18 1342  . promethazine (PHENERGAN) injection 6.25-12.5 mg  6.25-12.5 mg Intravenous Q15 min PRN Arlyss Repress T, MD      . traZODone (DESYREL) tablet 100 mg  100 mg Oral QHS PRN Clapacs, Madie Reno, MD   100 mg at 01/28/18 2157    Lab Results:  Results for orders placed or performed during the hospital encounter of 01/12/18 (from the past 48 hour(s))  Glucose, capillary     Status: None   Collection Time: 01/31/18  6:14 AM  Result Value Ref Range   Glucose-Capillary 88 70 - 99 mg/dL    Blood Alcohol level:  Lab Results  Component Value Date   ETH <10 04/26/5101    Metabolic Disorder Labs: No results found for: HGBA1C, MPG No results found for: PROLACTIN No results found for: CHOL, TRIG, HDL, CHOLHDL, VLDL, LDLCALC  Physical Findings: AIMS: Facial and Oral Movements Muscles of Facial Expression: None, normal Lips and Perioral Area: None, normal Jaw: None, normal Tongue: None, normal,Extremity Movements Upper (arms, wrists, hands, fingers): None, normal Lower (legs, knees, ankles, toes): None, normal, Trunk Movements Neck, shoulders, hips: None, normal, Overall Severity Severity of abnormal movements (highest score from questions above): None, normal Incapacitation due to abnormal movements: None, normal Patient's awareness of abnormal movements (rate only patient's report): No Awareness, Dental Status Current problems with teeth and/or dentures?: No Does patient usually wear dentures?: No  CIWA:    COWS:     Musculoskeletal: Strength & Muscle Tone: decreased Gait & Station: unsteady Patient leans: N/A  Psychiatric Specialty Exam: Physical Exam  Nursing note and vitals reviewed. Constitutional: He appears well-developed and well-nourished.    HENT:  Head: Normocephalic and atraumatic.  Eyes: Pupils are equal, round, and reactive to light. Conjunctivae are normal.  Neck: Normal range of motion.  Cardiovascular: Regular rhythm and normal heart sounds.  Respiratory: Effort normal. No respiratory distress.  GI: Soft.  Musculoskeletal: Normal range of motion.       Arms: Neurological: He is alert.  Skin: Skin is warm and dry.  Psychiatric: Judgment normal. His affect is blunt. His speech is delayed. He is slowed. Thought content is not paranoid. He expresses no homicidal and no suicidal ideation. He exhibits abnormal recent  memory.    Review of Systems  Constitutional: Negative.   HENT: Negative.   Eyes: Negative.   Respiratory: Negative.   Cardiovascular: Negative.   Gastrointestinal: Negative.   Musculoskeletal: Negative.   Skin: Negative.   Neurological: Negative.   Psychiatric/Behavioral: Negative.     Blood pressure 97/63, pulse 77, temperature 99.5 F (37.5 C), resp. rate 12, height 5\' 10"  (1.778 m), weight 73.5 kg (162 lb), SpO2 100 %.Body mass index is 23.24 kg/m.  General Appearance: Casual  Eye Contact:  Fair  Speech:  Slow  Volume:  Decreased  Mood:  Euthymic  Affect:  Constricted  Thought Process:  Goal Directed  Orientation:  Full (Time, Place, and Person)  Thought Content:  Logical  Suicidal Thoughts:  No  Homicidal Thoughts:  No  Memory:  Immediate;   Fair Recent;   Fair Remote;   Fair  Judgement:  Fair  Insight:  Fair  Psychomotor Activity:  Decreased  Concentration:  Concentration: Fair  Recall:  [employer] of Knowledge:  Fair  Language:  Fair  Akathisia:  No  Handed:  Right  AIMS (if indicated):     Assets:  Desire for Improvement Housing  ADL's:  Impaired  Cognition:  Impaired,  Mild  Sleep:  Number of Hours: 7.45     Treatment Plan Summary: Daily contact with patient to assess and evaluate symptoms and progress in treatment, Medication management and Plan In most ways doing  much better although he still remains mentally a little slowed and sluggish.  I think we are at the stage where we will begin working on possible transition to outpatient within the next several days but we will continue ECT for now.  Alethia Berthold, MD 01/31/2018, 6:16 PM

## 2018-01-31 NOTE — Anesthesia Post-op Follow-up Note (Signed)
Anesthesia QCDR form completed.        

## 2018-01-31 NOTE — Transfer of Care (Signed)
Immediate Anesthesia Transfer of Care Note  Patient: [name]  Procedure(s) Performed: ECT TX  Patient Location: PACU  Anesthesia Type:General  Level of Consciousness: sedated  Airway & Oxygen Therapy: Patient Spontanous Breathing and Patient connected to face mask oxygen  Post-op Assessment: Report given to RN and Post -op Vital signs reviewed and stable  Post vital signs: Reviewed and stable  Last Vitals:  Vitals Value Taken Time  BP 155/102 01/31/2018 11:46 AM  Temp    Pulse 78 01/31/2018 11:47 AM  Resp 11 01/31/2018 11:47 AM  SpO2 100 % 01/31/2018 11:47 AM  Vitals shown include unvalidated device data.  Last Pain:  Vitals:   01/31/18 0932  TempSrc: Oral  PainSc:       Patients Stated Pain Goal: 0 (90/68/93 4068)  Complications: No apparent anesthesia complications

## 2018-01-31 NOTE — H&P (Signed)
[name] is an 62 y.o. male.   Chief Complaint: Patient is feeling better.  Still a little bit slow and passive.  No specific new complaint HPI: History of long-standing depression gradually getting better with ECT and medicine  Past Medical History:  Diagnosis Date  . Depression     Past Surgical History:  Procedure Laterality Date  . HERNIA REPAIR      History reviewed. No pertinent family history. Social History:  reports that he has quit smoking. He quit smokeless tobacco use about 12 months ago. He reports that he has current or past drug history. His alcohol history is not on file.  Allergies: No Known Allergies  Medications Prior to Admission  Medication Sig Dispense Refill  . feeding supplement, ENSURE ENLIVE, (ENSURE ENLIVE) LIQD Take 237 mLs by mouth 2 (two) times daily between meals. 237 mL 12  . ibuprofen (ADVIL,MOTRIN) 600 MG tablet Take 1 tablet (600 mg total) by mouth every 6 (six) hours as needed for moderate pain. 30 tablet 0  . Multiple Vitamin (MULTIVITAMIN WITH MINERALS) TABS tablet Take 1 tablet by mouth daily. 30 tablet 0  . predniSONE (STERAPRED UNI-PAK 21 TAB) 10 MG (21) TBPK tablet Taper by 10 mg daily 21 tablet 0  . vitamin B-12 1000 MCG tablet Take 1 tablet (1,000 mcg total) by mouth daily. 30 tablet 0  . Vitamin D, Ergocalciferol, (DRISDOL) 50000 units CAPS capsule Take 1 capsule (50,000 Units total) by mouth every 7 (seven) days. 5 capsule 0    Results for orders placed or performed during the hospital encounter of 01/12/18 (from the past 48 hour(s))  Glucose, capillary     Status: None   Collection Time: 01/31/18  6:14 AM  Result Value Ref Range   Glucose-Capillary 88 70 - 99 mg/dL   No results found.  Review of Systems  Constitutional: Negative.   HENT: Negative.   Eyes: Negative.   Respiratory: Negative.   Cardiovascular: Negative.   Gastrointestinal: Negative.   Musculoskeletal: Negative.   Skin: Negative.   Neurological: Negative.    Psychiatric/Behavioral: Positive for memory loss. Negative for depression, hallucinations, substance abuse and suicidal ideas. The patient is not nervous/anxious and does not have insomnia.     Blood pressure 119/68, pulse 73, temperature 98.1 F (36.7 C), temperature source Oral, resp. rate 16, height 5\' 10"  (1.778 m), weight 73.5 kg (162 lb), SpO2 98 %. Physical Exam  Nursing note and vitals reviewed. Constitutional: He appears well-developed and well-nourished.  HENT:  Head: Normocephalic and atraumatic.  Eyes: Pupils are equal, round, and reactive to light. Conjunctivae are normal.  Neck: Normal range of motion.  Cardiovascular: Regular rhythm and normal heart sounds.  Respiratory: Effort normal. No respiratory distress.  GI: Soft.  Musculoskeletal: Normal range of motion.  Neurological: He is alert.  Skin: Skin is warm and dry.  Psychiatric: Judgment normal. His affect is blunt. His speech is delayed. He is slowed. Thought content is not paranoid. He expresses no homicidal and no suicidal ideation. He exhibits abnormal recent memory.     Assessment/Plan Treatment today.  Follow-up Friday.  Patient may be getting close to a plateauing level soon.  Alethia Berthold, MD 01/31/2018, 11:23 AM

## 2018-01-31 NOTE — Plan of Care (Signed)
  Problem: Activity: Goal: Interest or engagement in activities will improve Outcome: Progressing Goal: Sleeping patterns will improve Outcome: Progressing   Problem: Coping: Goal: Ability to verbalize frustrations and anger appropriately will improve Outcome: Progressing   Problem: Safety: Goal: Periods of time without injury will increase Outcome: Progressing   Problem: Education: Goal: Utilization of techniques to improve thought processes will improve Outcome: Progressing   Problem: Activity: Goal: Interest or engagement in leisure activities will improve Outcome: Progressing   Problem: Coping: Goal: Will verbalize feelings Outcome: Progressing   Problem: Safety: Goal: Ability to disclose and discuss suicidal ideas will improve Outcome: Progressing   Problem: Education: Goal: Knowledge of General Education information will improve Outcome: Progressing

## 2018-01-31 NOTE — Anesthesia Procedure Notes (Signed)
Procedure Name: General with mask airway Date/Time: 01/31/2018 11:28 AM Performed by: Allean Found, CRNA Pre-anesthesia Checklist: Patient identified, Emergency Drugs available, Suction available, Patient being monitored and Timeout performed Patient Re-evaluated:Patient Re-evaluated prior to induction Oxygen Delivery Method: Circle system utilized Preoxygenation: Pre-oxygenation with 100% oxygen Induction Type: IV induction Ventilation: Mask ventilation without difficulty Placement Confirmation: positive ETCO2 Dental Injury: Teeth and Oropharynx as per pre-operative assessment

## 2018-01-31 NOTE — Progress Notes (Signed)
   01/31/18 1345  Clinical Encounter Type  Visited With Patient  Visit Type Follow-up;Psychological support;Spiritual support;Behavioral Health  Referral From Nurse  Consult/Referral To Chaplain  Spiritual Encounters  Spiritual Needs Prayer;Emotional   [place] followed up with patient that was admitted to the hospital on July 5th, 2019. I have made many visits with the patient partly due to curiosity about the patient's diagnosis. The major depression that the patient demonstrates has become very debilitating for his very existence. It appears that the patient is in slow motion, though he has improved over the last week. I have wondered what a base line is for the patient.   Today the patient was pleasant and appeared to be excited to see me. I met with him in the room next to the craft room where he was deciding what he wanted to eat tomorrow. The patient's story has been consistent though out the times we have met. Patient stated that he did not know what he was going to do once he is released. I felt that he has not made the time or has not looked past what he is currently dealing with. Patient did state that he felt like his depression started once he lost his job of 23 years. This happened a couple of years ago but he "went down from there."   [place] will continue to meet with patient with the hopes of seeing more improvement for the patient.

## 2018-01-31 NOTE — Procedures (Signed)
ECT SERVICES Physician's Interval Evaluation & Treatment Note  Patient Identification: JARRET TORRE MRN:  [id number] Date of Evaluation:  01/31/2018 TX #: [id number]  MADRS:   MMSE:   P.E. Findings:  No change to physical exam  Psychiatric Interval Note:  Mood is stable not reported as depressed.  Feeling better.  Subjective:  Patient is a 62 y.o. male seen for evaluation for Electroconvulsive Therapy. Some memory impairment.  Still a little fatigued and slow  Treatment Summary:   []   Right Unilateral             [x]  Bilateral   % Energy : 1.0 ms 100%   Impedance: 1260 ohms  Seizure Energy Index: 4769 V squared  Postictal Suppression Index: 66%  Seizure Concordance Index: 61%  Medications  Pre Shock: Ketamine 100 mg, succinylcholine 80 mg  Post Shock:    Seizure Duration: 26 seconds EMG 44 seconds EEG   Comments: Follow-up Friday  Lungs:  [x]   Clear to auscultation               []  Other:   Heart:    [x]   Regular rhythm             []  irregular rhythm    [x]   Previous H&P reviewed, patient examined and there are NO CHANGES                 []   Previous H&P reviewed, patient examined and there are changes noted.   Alethia Berthold, MD 7/24/201911:25 AM

## 2018-01-31 NOTE — [facility] Group Notes (Signed)
[facility] Group Notes:  (Nursing/MHT/Case Management/Adjunct)  Date:  01/31/2018  Time:  6:36 PM  Type of Therapy:  Psychoeducational Skills  Participation Level:  Did Not Attend   Adela Lank [facility] 01/31/2018, 6:36 PM

## 2018-01-31 NOTE — Progress Notes (Signed)
Patient rested comfortably without any issues and voice no complain, patient is stable and alert. Safety is maintained.

## 2018-01-31 NOTE — Anesthesia Postprocedure Evaluation (Signed)
Anesthesia Post Note  Patient: Zimere Dunlevy [place]  Procedure(s) Performed: ECT TX  Patient location during evaluation: PACU Anesthesia Type: General Level of consciousness: awake and alert Pain management: pain level controlled Vital Signs Assessment: post-procedure vital signs reviewed and stable Respiratory status: spontaneous breathing, nonlabored ventilation and respiratory function stable Cardiovascular status: blood pressure returned to baseline and stable Postop Assessment: no apparent nausea or vomiting Anesthetic complications: no     Last Vitals:  Vitals:   01/30/18 0611 01/31/18 0611  BP: 120/80 130/70  Pulse: 66 68  Resp: 18 18  Temp: 36.7 C   SpO2: 98% 99%    Last Pain:  Vitals:   01/31/18 0300  TempSrc:   PainSc: 0-No pain                 Alphonsus Sias

## 2018-01-31 NOTE — Progress Notes (Signed)
Recreation Therapy Notes  Date: 01/31/2018  Time: 9:30 am   Location: Craft Room   Behavioral response: N/A   Intervention Topic: Communication  Discussion/Intervention: Patient did not attend group.   Clinical Observations/Feedback:  Patient did not attend group.   [name] LRT/CTRS         [name] 01/31/2018 11:51 AM

## 2018-01-31 NOTE — [facility] Group Notes (Signed)
LCSW Group Therapy Note  01/31/2018 1:-00 pm  Type of Therapy/Topic:  Group Therapy:  Emotion Regulation  Participation Level:  Did Not Attend   Description of Group:    The purpose of this group is to assist patients in learning to regulate negative emotions and experience positive emotions. Patients will be guided to discuss ways in which they have been vulnerable to their negative emotions. These vulnerabilities will be juxtaposed with experiences of positive emotions or situations, and patients will be challenged to use positive emotions to combat negative ones. Special emphasis will be placed on coping with negative emotions in conflict situations, and patients will process healthy conflict resolution skills.  Therapeutic Goals: 1. Patient will identify two positive emotions or experiences to reflect on in order to balance out negative emotions 2. Patient will label two or more emotions that they find the most difficult to experience 3. Patient will demonstrate positive conflict resolution skills through discussion and/or role plays  Summary of Patient Progress: [name] was invited to today's group, but chose not to attend.      Therapeutic Modalities:   Cognitive Behavioral Therapy Feelings Identification Dialectical Behavioral Therapy

## 2018-01-31 NOTE — Anesthesia Postprocedure Evaluation (Signed)
Anesthesia Post Note  Patient: [name]  Procedure(s) Performed: ECT TX  Patient location during evaluation: PACU Anesthesia Type: General Level of consciousness: awake and alert Pain management: pain level controlled Vital Signs Assessment: post-procedure vital signs reviewed and stable Respiratory status: spontaneous breathing, nonlabored ventilation, respiratory function stable and patient connected to nasal cannula oxygen Cardiovascular status: blood pressure returned to baseline and stable Postop Assessment: no apparent nausea or vomiting Anesthetic complications: no     Last Vitals:  Vitals:   01/31/18 1236 01/31/18 1241  BP: 133/83   Pulse: 75 73  Resp: 14 12  Temp:    SpO2: 100% 100%    Last Pain:  Vitals:   01/31/18 1226  TempSrc:   PainSc: 0-No pain                 Precious Haws [name]

## 2018-02-01 ENCOUNTER — Other Ambulatory Visit: Payer: Self-pay | Admitting: Psychiatry

## 2018-02-01 NOTE — Progress Notes (Signed)
D: Patient is very improved from this nurses last interaction with patient. Patient verbalizes that ECT treatment is progressing. Patient denies any SI/HI/AVH at this time. Patient is appropriate and cooperative.   A: Patient's safety is maintained on unit. patient is provided with scheduled medication. Patient participate with care.   R: Patient has no complaints. Patient is compliant with scheduled medication.

## 2018-02-01 NOTE — Progress Notes (Signed)
Recreation Therapy Notes  Date: 02/01/2018  Time: 9:30 am   Location: Craft Room   Behavioral response: N/A   Intervention Topic: Happiness  Discussion/Intervention: Patient did not attend group.   Clinical Observations/Feedback:  Patient did not attend group.   Maysa Lynn LRT/CTRS        Jonessa Triplett 02/01/2018 11:03 AM

## 2018-02-01 NOTE — [facility] Group Notes (Signed)
  02/01/2018  Time: 1PM  Type of Therapy/Topic:  Group Therapy:  Balance in Life  Participation Level:  Did Not Attend  Description of Group:   This group will address the concept of balance and how it feels and looks when one is unbalanced. Patients will be encouraged to process areas in their lives that are out of balance and identify reasons for remaining unbalanced. Facilitators will guide patients in utilizing problem-solving interventions to address and correct the stressor making their life unbalanced. Understanding and applying boundaries will be explored and addressed for obtaining and maintaining a balanced life. Patients will be encouraged to explore ways to assertively make their unbalanced needs known to significant others in their lives, using other group members and facilitator for support and feedback.  Therapeutic Goals: 1. Patient will identify two or more emotions or situations they have that consume much of in their lives. 2. Patient will identify signs/triggers that life has become out of balance:  3. Patient will identify two ways to set boundaries in order to achieve balance in their lives:  4. Patient will demonstrate ability to communicate their needs through discussion and/or role plays  Summary of Patient Progress: Pt was invited to attend group but chose not to attend. CSW will continue to encourage pt to attend group throughout their admission.    Therapeutic Modalities:   Cognitive Behavioral Therapy Solution-Focused Therapy Assertiveness Training  Alden Hipp, MSW, LCSW Clinical Social Worker 02/01/2018 1:58 PM

## 2018-02-01 NOTE — Progress Notes (Signed)
[facility] MD Progress Note  02/01/2018 7:44 PM [name]  MRN:  [id number] Subjective: Follow-up for this patient receiving ECT treatment.  Got to speak with the patient and his grandson tonight.  Patient continues to be improved although it is not clear that we are having dramatically increased improvement with additional treatments.  He is not reporting suicidal thoughts.  Energy level and physical abilities have definitely improved.  Nevertheless the patient is still pretty withdrawn and blunted and slow and not thinking much about the future. Principal Problem: Severe recurrent major depression without psychotic features ([place]) Diagnosis:   Patient Active Problem List   Diagnosis Date Noted  . Catatonia [F06.1] 01/12/2018  . Malnutrition of moderate degree [E44.0] 01/09/2018  . Pressure injury of skin [L89.90] 01/09/2018  . Severe recurrent major depression without psychotic features ([place]) [F33.2] 01/09/2018  . Back pain [M54.9] 01/08/2018  . Shuffling gait [R26.89] 01/08/2018  . Depression [F32.9] 01/08/2018   Total Time spent with patient: 20 minutes  Past Psychiatric History: Long history of depression many years which just got worse to the point of catatonia  Past Medical History:  Past Medical History:  Diagnosis Date  . Depression     Past Surgical History:  Procedure Laterality Date  . HERNIA REPAIR     Family History: History reviewed. No pertinent family history. Family Psychiatric  History: See previous note Social History:  Social History   Substance and Sexual Activity  Alcohol Use Not on file     Social History   Substance and Sexual Activity  Drug Use Not Currently    Social History   Socioeconomic History  . Marital status: Married    Spouse name: Not on file  . Number of children: Not on file  . Years of education: Not on file  . Highest education level: Not on file  Occupational History  . Not on file  Social Needs  . Financial resource strain:  Not on file  . Food insecurity:    Worry: Not on file    Inability: Not on file  . Transportation needs:    Medical: Not on file    Non-medical: Not on file  Tobacco Use  . Smoking status: Former Research scientist (life sciences)  . Smokeless tobacco: Former Systems developer    Quit date: 01/10/2017  Substance and Sexual Activity  . Alcohol use: Not on file  . Drug use: Not Currently  . Sexual activity: Not Currently  Lifestyle  . Physical activity:    Days per week: Not on file    Minutes per session: Not on file  . Stress: Not on file  Relationships  . Social connections:    Talks on phone: Not on file    Gets together: Not on file    Attends religious service: Not on file    Active member of club or organization: Not on file    Attends meetings of clubs or organizations: Not on file    Relationship status: Not on file  Other Topics Concern  . Not on file  Social History Narrative  . Not on file   Additional Social History:                         Sleep: Fair  Appetite:  Fair  Current Medications: Current Facility-Administered Medications  Medication Dose Route Frequency Provider Last Rate Last Dose  . acetaminophen (TYLENOL) tablet 650 mg  650 mg Oral Q6H PRN Clapacs, Madie Reno, MD  650 mg at 01/14/18 2303  . alum & mag hydroxide-simeth (MAALOX/MYLANTA) 200-200-20 MG/5ML suspension 30 mL  30 mL Oral Q4H PRN Clapacs, John T, MD      . feeding supplement (ENSURE ENLIVE) (ENSURE ENLIVE) liquid 237 mL  237 mL Oral BID BM Clapacs, John T, MD   237 mL at 02/01/18 1350  . Gerhardt's butt cream   Topical BID Clapacs, John T, MD      . ibuprofen (ADVIL,MOTRIN) tablet 400 mg  400 mg Oral Q6H PRN Clapacs, Madie Reno, MD   400 mg at 01/17/18 2055  . magnesium hydroxide (MILK OF MAGNESIA) suspension 30 mL  30 mL Oral Daily PRN Clapacs, Madie Reno, MD   30 mL at 01/16/18 0852  . mirtazapine (REMERON) tablet 45 mg  45 mg Oral QHS Clapacs, Madie Reno, MD   45 mg at 01/31/18 2120  . multivitamin with minerals tablet 1 tablet   1 tablet Oral Daily Clapacs, Madie Reno, MD   1 tablet at 02/01/18 0757  . ondansetron (ZOFRAN) injection 4 mg  4 mg Intravenous Once PRN Alvin Critchley, MD      . pantoprazole (PROTONIX) EC tablet 40 mg  40 mg Oral Daily Clapacs, Madie Reno, MD   40 mg at 02/01/18 0757  . promethazine (PHENERGAN) injection 6.25-12.5 mg  6.25-12.5 mg Intravenous Q15 min PRN Arlyss Repress T, MD      . traZODone (DESYREL) tablet 100 mg  100 mg Oral QHS PRN Clapacs, Madie Reno, MD   100 mg at 01/31/18 2120    Lab Results:  Results for orders placed or performed during the hospital encounter of 01/12/18 (from the past 48 hour(s))  Glucose, capillary     Status: None   Collection Time: 01/31/18  6:14 AM  Result Value Ref Range   Glucose-Capillary 88 70 - 99 mg/dL    Blood Alcohol level:  Lab Results  Component Value Date   ETH <10 16/04/9603    Metabolic Disorder Labs: No results found for: HGBA1C, MPG No results found for: PROLACTIN No results found for: CHOL, TRIG, HDL, CHOLHDL, VLDL, LDLCALC  Physical Findings: AIMS: Facial and Oral Movements Muscles of Facial Expression: None, normal Lips and Perioral Area: None, normal Jaw: None, normal Tongue: None, normal,Extremity Movements Upper (arms, wrists, hands, fingers): None, normal Lower (legs, knees, ankles, toes): None, normal, Trunk Movements Neck, shoulders, hips: None, normal, Overall Severity Severity of abnormal movements (highest score from questions above): None, normal Incapacitation due to abnormal movements: None, normal Patient's awareness of abnormal movements (rate only patient's report): No Awareness, Dental Status Current problems with teeth and/or dentures?: No Does patient usually wear dentures?: No  CIWA:    COWS:     Musculoskeletal: Strength & Muscle Tone: within normal limits Gait & Station: normal Patient leans: N/A  Psychiatric Specialty Exam: Physical Exam  Nursing note and vitals reviewed. Constitutional: He appears  well-developed and well-nourished.  HENT:  Head: Normocephalic and atraumatic.  Eyes: Pupils are equal, round, and reactive to light. Conjunctivae are normal.  Neck: Normal range of motion.  Cardiovascular: Regular rhythm and normal heart sounds.  Respiratory: Effort normal.  GI: Soft.  Musculoskeletal: Normal range of motion.  Neurological: He is alert.  Skin: Skin is warm and dry.  Psychiatric: Judgment normal. His affect is blunt. His speech is delayed. He is slowed. Thought content is not paranoid. He expresses no homicidal and no suicidal ideation. He exhibits abnormal recent memory.    Review of Systems  Constitutional: Negative.   HENT: Negative.   Eyes: Negative.   Respiratory: Negative.   Cardiovascular: Negative.   Gastrointestinal: Negative.   Musculoskeletal: Negative.   Skin: Negative.   Neurological: Negative.   Psychiatric/Behavioral: Negative.     Blood pressure 114/74, pulse 67, temperature 98.7 F (37.1 C), temperature source Oral, resp. rate 16, height 5\' 10"  (1.778 m), weight 73.5 kg (162 lb), SpO2 98 %.Body mass index is 23.24 kg/m.  General Appearance: Fairly Groomed  Eye Contact:  Fair  Speech:  Slow  Volume:  Decreased  Mood:  Euthymic  Affect:  Constricted  Thought Process:  Goal Directed  Orientation:  Full (Time, Place, and Person)  Thought Content:  Logical  Suicidal Thoughts:  No  Homicidal Thoughts:  No  Memory:  Immediate;   Fair Recent;   Poor Remote;   Fair  Judgement:  Fair  Insight:  Fair  Psychomotor Activity:  Decreased  Concentration:  Concentration: Fair  Recall:  [employer] of Knowledge:  Fair  Language:  Fair  Akathisia:  No  Handed:  Right  AIMS (if indicated):     Assets:  Desire for Improvement Housing Social Support  ADL's:  Impaired  Cognition:  Impaired,  Mild  Sleep:  Number of Hours: 7.3     Treatment Plan Summary: Daily contact with patient to assess and evaluate symptoms and progress in treatment,  Medication management and Plan Patient seems to have possibly plateaued with treatment but unfortunately this plateau is still fairly impaired.  Talked with the patient and his grandson tonight about discharge planning.  Not clear how well the patient would function independently right now.  We are going to continue with ECT tomorrow and then plan for likely discharge on Monday.  Patient is agreeable to the plan.  Does not get too panicky about it.  No other change to medication for today.  Alethia Berthold, MD 02/01/2018, 7:44 PM

## 2018-02-01 NOTE — Plan of Care (Signed)
Patient is calm and cooperating with care of ADLs and medical regimen, continued to improve, depression is rated at 3/10 , and verbalize positive feeling of self, stated I'm feeling better with my treatment. No noticeable side effects from ECT treatment. Denies SI/HI/AVH , 15 minute rounding maintained. No distress.   Problem: Activity: Goal: Interest or engagement in activities will improve Outcome: Progressing Goal: Sleeping patterns will improve Outcome: Progressing   Problem: Education: Goal: Utilization of techniques to improve thought processes will improve Outcome: Progressing   Problem: Activity: Goal: Interest or engagement in leisure activities will improve Outcome: Progressing   Problem: Coping: Goal: Will verbalize feelings Outcome: Progressing   Problem: Safety: Goal: Ability to disclose and discuss suicidal ideas will improve Outcome: Progressing   Problem: Education: Goal: Knowledge of General Education information will improve Outcome: Progressing

## 2018-02-01 NOTE — [facility] Group Notes (Signed)
LCSW Group Therapy Note 02/01/2018 9:00 AM  Type of Therapy and Topic:  Group Therapy:  Setting Goals  Participation Level:  Did Not Attend  Description of Group: In this process group, patients discussed using strengths to work toward goals and address challenges.  Patients identified two positive things about themselves and one goal they were working on.  Patients were given the opportunity to share openly and support each other's plan for self-empowerment.  The group discussed the value of gratitude and were encouraged to have a daily reflection of positive characteristics or circumstances.  Patients were encouraged to identify a plan to utilize their strengths to work on current challenges and goals.  Therapeutic Goals 1. Patient will verbalize personal strengths/positive qualities and relate how these can assist with achieving desired personal goals 2. Patients will verbalize affirmation of peers plans for personal change and goal setting 3. Patients will explore the value of gratitude and positive focus as related to successful achievement of goals 4. Patients will verbalize a plan for regular reinforcement of personal positive qualities and circumstances.  Summary of Patient Progress: [name] was invited to today's group, but chose not to attend.      Therapeutic Modalities Cognitive Behavioral Therapy Motivational Interviewing    Devona Konig, Marlinda Mike 02/01/2018 12:49 PM

## 2018-02-02 ENCOUNTER — Inpatient Hospital Stay: Payer: BLUE CROSS/BLUE SHIELD | Admitting: Certified Registered"

## 2018-02-02 ENCOUNTER — Encounter: Payer: Self-pay | Admitting: *Deleted

## 2018-02-02 LAB — GLUCOSE, CAPILLARY: GLUCOSE-CAPILLARY: 83 mg/dL (ref 70–99)

## 2018-02-02 MED ORDER — METHOHEXITAL SODIUM 0.5 G IJ SOLR
INTRAMUSCULAR | Status: AC
  Filled 2018-02-02: qty 500

## 2018-02-02 MED ORDER — SUCCINYLCHOLINE CHLORIDE 20 MG/ML IJ SOLN
INTRAMUSCULAR | Status: DC | PRN
  Administered 2018-02-02: 80 mg via INTRAVENOUS

## 2018-02-02 MED ORDER — SODIUM CHLORIDE 0.9 % IV SOLN
500.0000 mL | Freq: Once | INTRAVENOUS | Status: AC
  Administered 2018-02-02: 500 mL via INTRAVENOUS

## 2018-02-02 MED ORDER — KETAMINE HCL 50 MG/ML IJ SOLN
INTRAMUSCULAR | Status: AC
  Filled 2018-02-02: qty 10

## 2018-02-02 MED ORDER — LACTATED RINGERS IV SOLN
INTRAVENOUS | Status: DC | PRN
  Administered 2018-02-02: 10:00:00 via INTRAVENOUS

## 2018-02-02 MED ORDER — KETAMINE HCL 10 MG/ML IJ SOLN
INTRAMUSCULAR | Status: DC | PRN
  Administered 2018-02-02: 100 mg via INTRAVENOUS

## 2018-02-02 NOTE — [facility] Group Notes (Signed)
[facility] Group Notes:  (Nursing/MHT/Case Management/Adjunct)  Date:  02/02/2018  Time:  3:48 AM  Type of Therapy:  Group Therapy  Participation Level:  Did Not Attend   Nehemiah Settle 02/02/2018, 3:48 AM

## 2018-02-02 NOTE — Progress Notes (Signed)
Received [name] this AM in his room awaiting for transport to ECT. He returned from the PACU at 1120hrs and took a nap until 1400 hrs. He ate a late lunch in the dinning room. He endorsed feeling less depressed and denied feeling suicidal. His affect is bright and making good eye contact. He received one Gatorade this PM related to having a low B/P.

## 2018-02-02 NOTE — Anesthesia Post-op Follow-up Note (Signed)
Anesthesia QCDR form completed.        

## 2018-02-02 NOTE — Procedures (Signed)
ECT SERVICES Physician's Interval Evaluation & Treatment Note  Patient Identification: [name] MRN:  [id number] Date of Evaluation:  02/02/2018 TX #: 9  MADRS:   MMSE:   P.E. Findings:  No change to physical exam.  Psychiatric Interval Note:  Mood continues to be reported as improved affect is more interactive physically doing better  Subjective:  Patient is a 62 y.o. male seen for evaluation for Electroconvulsive Therapy. Feeling a little better although still little slow and tired  Treatment Summary:   []   Right Unilateral             [x]  Bilateral   % Energy : 1.0 ms 100%   Impedance: 1250 ohms  Seizure Energy Index: 4553 V squared  Postictal Suppression Index: 78%  Seizure Concordance Index: 98%  Medications  Pre Shock: Ketamine 80 mg succinylcholine 80 mg  Post Shock:    Seizure Duration: 24 seconds EMG 42 seconds EEG   Comments: Not quite ready for discharge yet.  Next treatment still scheduled for Monday  Lungs:  [x]   Clear to auscultation               []  Other:   Heart:    [x]   Regular rhythm             []  irregular rhythm    [x]   Previous H&P reviewed, patient examined and there are NO CHANGES                 []   Previous H&P reviewed, patient examined and there are changes noted.   Alethia Berthold, MD 7/26/201910:13 AM

## 2018-02-02 NOTE — Progress Notes (Signed)
Nursing note 7p-7a  Pt observed interacting with peers on unit this shift. Displayed a flat  affect and pleasant mood upon interaction with this Probation officer. Pt complains of lower back pain, IBU giver per MD order, see MAR for prn medication administration. Pt denies SI/HI, and also denies any audio or visual hallucinations at this time. Pt is able to verbally contract for safety with this RN. Goal: "take a shower for my treatment tomorrow". Pt educated not to eat or drink after midnight. Pt is now NPO.   Pt is now resting in bed with eyes closed, with no signs or symptoms of pain or distress noted. Pt continues to remain safe on the unit and is observed by rounding every 15 min. RN will continue to monitor.

## 2018-02-02 NOTE — Plan of Care (Signed)
  Problem: Activity: Goal: Interest or engagement in activities will improve Outcome: Progressing Goal: Sleeping patterns will improve Outcome: Progressing   Problem: Education: Goal: Utilization of techniques to improve thought processes will improve Outcome: Progressing   Problem: Activity: Goal: Interest or engagement in leisure activities will improve Outcome: Progressing   Problem: Coping: Goal: Will verbalize feelings Outcome: Progressing   Problem: Safety: Goal: Ability to disclose and discuss suicidal ideas will improve Outcome: Progressing   Problem: Education: Goal: Knowledge of General Education information will improve Outcome: Progressing

## 2018-02-02 NOTE — Anesthesia Preprocedure Evaluation (Signed)
Anesthesia Evaluation  Patient identified by MRN, date of birth, ID band Patient awake    Reviewed: Allergy & Precautions, NPO status , Patient's Chart, lab work & pertinent test results  History of Anesthesia Complications Negative for: history of anesthetic complications  Airway Mallampati: III  TM Distance: >3 FB Neck ROM: Full    Dental no notable dental hx.    Pulmonary neg sleep apnea, neg COPD, former smoker,    breath sounds clear to auscultation- rhonchi (-) wheezing      Cardiovascular Exercise Tolerance: Good (-) hypertension(-) CAD, (-) Past MI, (-) Cardiac Stents and (-) CABG  Rhythm:Regular Rate:Normal - Systolic murmurs and - Diastolic murmurs    Neuro/Psych PSYCHIATRIC DISORDERS Depression negative neurological ROS     GI/Hepatic negative GI ROS, Neg liver ROS,   Endo/Other  negative endocrine ROSneg diabetes  Renal/GU negative Renal ROS     Musculoskeletal negative musculoskeletal ROS (+)   Abdominal (+) - obese,   Peds  Hematology negative hematology ROS (+)   Anesthesia Other Findings Past Medical History: No date: Depression   Reproductive/Obstetrics                             Anesthesia Physical Anesthesia Plan  ASA: II  Anesthesia Plan: General   Post-op Pain Management:    Induction: Intravenous  PONV Risk Score and Plan: 1 and Ondansetron  Airway Management Planned: Mask  Additional Equipment:   Intra-op Plan:   Post-operative Plan:   Informed Consent: I have reviewed the patients History and Physical, chart, labs and discussed the procedure including the risks, benefits and alternatives for the proposed anesthesia with the patient or authorized representative who has indicated his/her understanding and acceptance.   Dental advisory given  Plan Discussed with: CRNA and Anesthesiologist  Anesthesia Plan Comments:         Anesthesia Quick  Evaluation

## 2018-02-02 NOTE — Progress Notes (Signed)
Nursing note 7p-7a  Pt observed interacting with peers on unit this shift. Displayed a flat affect and pleasant mood upon interaction with this Probation officer. Pt complains of back pain and insomnia. See Mar for prn medication administration. Pt denies SI/HI, and also denies any audio or visual hallucinations at this time. Pt is able to verbally contract for safety with this RN. Pt is now resting in bed with eyes closed, with no signs or symptoms of pain or distress noted. Pt continues to remain safe on the unit and is observed by rounding every 15 min. RN will continue to monitor.

## 2018-02-02 NOTE — Plan of Care (Signed)
  Problem: Activity: Goal: Interest or engagement in activities will improve Outcome: Progressing Goal: Sleeping patterns will improve Outcome: Progressing   Problem: Education: Goal: Utilization of techniques to improve thought processes will improve Outcome: Progressing   Problem: Activity: Goal: Interest or engagement in leisure activities will improve Outcome: Progressing   Problem: Coping: Goal: Will verbalize feelings Outcome: Progressing   Problem: Safety: Goal: Ability to disclose and discuss suicidal ideas will improve Outcome: Progressing   Problem: Education: Goal: Knowledge of General Education information will improve Outcome: Progressing  Pt continues to progress towards goals and d/c. RN will continue to monitor.

## 2018-02-02 NOTE — H&P (Signed)
[name] is an 62 y.o. male.   Chief Complaint: Patient with no specific new complaint.  Patient continues to have some decreased energy a little bit of muscle stiffness but no specific physical complaints.  Does not feel depressed but also does not have a lot of motivation. HPI: Long-standing depression going on for years.  Showing some response to ECT but still with some low energy lethargy and mental slowing  Past Medical History:  Diagnosis Date  . Depression     Past Surgical History:  Procedure Laterality Date  . HERNIA REPAIR      History reviewed. No pertinent family history. Social History:  reports that he has quit smoking. He quit smokeless tobacco use about 12 months ago. He reports that he has current or past drug history. His alcohol history is not on file.  Allergies: No Known Allergies  Medications Prior to Admission  Medication Sig Dispense Refill  . feeding supplement, ENSURE ENLIVE, (ENSURE ENLIVE) LIQD Take 237 mLs by mouth 2 (two) times daily between meals. 237 mL 12  . ibuprofen (ADVIL,MOTRIN) 600 MG tablet Take 1 tablet (600 mg total) by mouth every 6 (six) hours as needed for moderate pain. 30 tablet 0  . Multiple Vitamin (MULTIVITAMIN WITH MINERALS) TABS tablet Take 1 tablet by mouth daily. 30 tablet 0  . predniSONE (STERAPRED UNI-PAK 21 TAB) 10 MG (21) TBPK tablet Taper by 10 mg daily 21 tablet 0  . vitamin B-12 1000 MCG tablet Take 1 tablet (1,000 mcg total) by mouth daily. 30 tablet 0  . Vitamin D, Ergocalciferol, (DRISDOL) 50000 units CAPS capsule Take 1 capsule (50,000 Units total) by mouth every 7 (seven) days. 5 capsule 0    Results for orders placed or performed during the hospital encounter of 01/12/18 (from the past 48 hour(s))  Glucose, capillary     Status: None   Collection Time: 02/02/18  6:13 AM  Result Value Ref Range   Glucose-Capillary 83 70 - 99 mg/dL   No results found.  Review of Systems  Constitutional: Positive for  malaise/fatigue.  HENT: Negative.   Eyes: Negative.   Respiratory: Negative.   Cardiovascular: Negative.   Gastrointestinal: Negative.   Musculoskeletal: Negative.   Skin: Negative.   Neurological: Negative.   Psychiatric/Behavioral: Positive for memory loss. Negative for depression, hallucinations, substance abuse and suicidal ideas. The patient is not nervous/anxious and does not have insomnia.     Blood pressure 109/63, pulse 63, temperature 97.8 F (36.6 C), temperature source Oral, resp. rate 18, height 5\' 10"  (1.778 m), weight 162 lb (73.5 kg), SpO2 (!) 63 %. Physical Exam  Nursing note and vitals reviewed. Constitutional: He appears well-developed and well-nourished.  HENT:  Head: Normocephalic and atraumatic.  Eyes: Pupils are equal, round, and reactive to light. Conjunctivae are normal.  Neck: Normal range of motion.  Cardiovascular: Regular rhythm and normal heart sounds.  Respiratory: Effort normal. No respiratory distress.  GI: Soft.  Musculoskeletal: Normal range of motion.  Neurological: He is alert.  Skin: Skin is warm and dry.  Psychiatric: He has a normal mood and affect. Judgment normal. His speech is delayed. He is slowed. He expresses no suicidal ideation. He exhibits abnormal recent memory.     Assessment/Plan Continue bilateral treatment although we are looking towards probable discharge next week as he seems to be approaching a plateau.  Alethia Berthold, MD 02/02/2018, 9:47 AM

## 2018-02-02 NOTE — Anesthesia Procedure Notes (Signed)
Procedure Name: General with mask airway Performed by: Rolla Plate, CRNA Pre-anesthesia Checklist: Patient identified, Patient being monitored, Timeout performed, Emergency Drugs available and Suction available Patient Re-evaluated:Patient Re-evaluated prior to induction Oxygen Delivery Method: Circle system utilized Preoxygenation: Pre-oxygenation with 100% oxygen Induction Type: IV induction Ventilation: Mask ventilation without difficulty

## 2018-02-02 NOTE — Progress Notes (Signed)
[facility] MD Progress Note  02/02/2018 8:27 PM [name]  MRN:  [id number] Subjective: Follow-up for this patient with severe depression receiving ECT.  Patient has no new complaints.  Continues to report that he is feeling a little better.  He is ambulatory now and eating better and certainly better cleaned up.  Still has slow thinking.  Also still unable to articulate clear-cut plans for the future. Principal Problem: Severe recurrent major depression without psychotic features ([place]) Diagnosis:   Patient Active Problem List   Diagnosis Date Noted  . Catatonia [F06.1] 01/12/2018  . Malnutrition of moderate degree [E44.0] 01/09/2018  . Pressure injury of skin [L89.90] 01/09/2018  . Severe recurrent major depression without psychotic features ([place]) [F33.2] 01/09/2018  . Back pain [M54.9] 01/08/2018  . Shuffling gait [R26.89] 01/08/2018  . Depression [F32.9] 01/08/2018   Total Time spent with patient: 30 minutes  Past Psychiatric History: Long-standing depression of several years duration with catatonia  Past Medical History:  Past Medical History:  Diagnosis Date  . Depression     Past Surgical History:  Procedure Laterality Date  . HERNIA REPAIR     Family History: History reviewed. No pertinent family history. Family Psychiatric  History: See previous note Social History:  Social History   Substance and Sexual Activity  Alcohol Use Not on file     Social History   Substance and Sexual Activity  Drug Use Not Currently    Social History   Socioeconomic History  . Marital status: Married    Spouse name: Not on file  . Number of children: Not on file  . Years of education: Not on file  . Highest education level: Not on file  Occupational History  . Not on file  Social Needs  . Financial resource strain: Not on file  . Food insecurity:    Worry: Not on file    Inability: Not on file  . Transportation needs:    Medical: Not on file    Non-medical: Not on file   Tobacco Use  . Smoking status: Former Research scientist (life sciences)  . Smokeless tobacco: Former Systems developer    Quit date: 01/10/2017  Substance and Sexual Activity  . Alcohol use: Not on file  . Drug use: Not Currently  . Sexual activity: Not Currently  Lifestyle  . Physical activity:    Days per week: Not on file    Minutes per session: Not on file  . Stress: Not on file  Relationships  . Social connections:    Talks on phone: Not on file    Gets together: Not on file    Attends religious service: Not on file    Active member of club or organization: Not on file    Attends meetings of clubs or organizations: Not on file    Relationship status: Not on file  Other Topics Concern  . Not on file  Social History Narrative  . Not on file   Additional Social History:                         Sleep: Fair  Appetite:  Good  Current Medications: Current Facility-Administered Medications  Medication Dose Route Frequency Provider Last Rate Last Dose  . acetaminophen (TYLENOL) tablet 650 mg  650 mg Oral Q6H PRN Karlin Heilman, Madie Reno, MD   650 mg at 01/14/18 2303  . alum & mag hydroxide-simeth (MAALOX/MYLANTA) 200-200-20 MG/5ML suspension 30 mL  30 mL Oral Q4H PRN Larisha Vencill  T, MD      . feeding supplement (ENSURE ENLIVE) (ENSURE ENLIVE) liquid 237 mL  237 mL Oral BID BM Janayah Zavada T, MD   237 mL at 02/01/18 1350  . Gerhardt's butt cream   Topical BID Majed Pellegrin T, MD      . ibuprofen (ADVIL,MOTRIN) tablet 400 mg  400 mg Oral Q6H PRN Jemario Poitras, Madie Reno, MD   400 mg at 02/01/18 2141  . magnesium hydroxide (MILK OF MAGNESIA) suspension 30 mL  30 mL Oral Daily PRN Shawnise Peterkin, Madie Reno, MD   30 mL at 01/16/18 0852  . mirtazapine (REMERON) tablet 45 mg  45 mg Oral QHS Resa Rinks, Madie Reno, MD   45 mg at 02/01/18 2142  . multivitamin with minerals tablet 1 tablet  1 tablet Oral Daily Kolbi Altadonna, Madie Reno, MD   1 tablet at 02/02/18 1418  . ondansetron (ZOFRAN) injection 4 mg  4 mg Intravenous Once PRN Alvin Critchley, MD      .  pantoprazole (PROTONIX) EC tablet 40 mg  40 mg Oral Daily Michio Thier, Madie Reno, MD   40 mg at 02/02/18 1418  . promethazine (PHENERGAN) injection 6.25-12.5 mg  6.25-12.5 mg Intravenous Q15 min PRN Arlyss Repress T, MD      . traZODone (DESYREL) tablet 100 mg  100 mg Oral QHS PRN Kamir Selover, Madie Reno, MD   100 mg at 02/01/18 2141    Lab Results:  Results for orders placed or performed during the hospital encounter of 01/12/18 (from the past 48 hour(s))  Glucose, capillary     Status: None   Collection Time: 02/02/18  6:13 AM  Result Value Ref Range   Glucose-Capillary 83 70 - 99 mg/dL    Blood Alcohol level:  Lab Results  Component Value Date   ETH <10 45/80/9983    Metabolic Disorder Labs: No results found for: HGBA1C, MPG No results found for: PROLACTIN No results found for: CHOL, TRIG, HDL, CHOLHDL, VLDL, LDLCALC  Physical Findings: AIMS: Facial and Oral Movements Muscles of Facial Expression: None, normal Lips and Perioral Area: None, normal Jaw: None, normal Tongue: None, normal,Extremity Movements Upper (arms, wrists, hands, fingers): None, normal Lower (legs, knees, ankles, toes): None, normal, Trunk Movements Neck, shoulders, hips: None, normal, Overall Severity Severity of abnormal movements (highest score from questions above): None, normal Incapacitation due to abnormal movements: None, normal Patient's awareness of abnormal movements (rate only patient's report): No Awareness, Dental Status Current problems with teeth and/or dentures?: No Does patient usually wear dentures?: No  CIWA:    COWS:     Musculoskeletal: Strength & Muscle Tone: decreased Gait & Station: unsteady Patient leans: N/A  Psychiatric Specialty Exam: Physical Exam  Nursing note and vitals reviewed. Constitutional: He appears well-developed and well-nourished.  HENT:  Head: Normocephalic and atraumatic.  Eyes: Pupils are equal, round, and reactive to light. Conjunctivae are normal.  Neck: Normal  range of motion.  Cardiovascular: Regular rhythm and normal heart sounds.  Respiratory: Effort normal. No respiratory distress.  GI: Soft.  Musculoskeletal: Normal range of motion.  Neurological: He is alert.  Skin: Skin is warm and dry.  Psychiatric: Judgment normal. His affect is blunt. His speech is delayed. He is slowed. He expresses no suicidal ideation.    Review of Systems  Constitutional: Negative.   HENT: Negative.   Eyes: Negative.   Respiratory: Negative.   Cardiovascular: Negative.   Gastrointestinal: Negative.   Musculoskeletal: Negative.   Skin: Negative.   Neurological: Negative.   Psychiatric/Behavioral:  Negative for depression, hallucinations, memory loss, substance abuse and suicidal ideas. The patient is not nervous/anxious and does not have insomnia.     Blood pressure 97/63, pulse 67, temperature 99.4 F (37.4 C), resp. rate (!) 9, height 5\' 10"  (1.778 m), weight 73.5 kg (162 lb), SpO2 99 %.Body mass index is 23.24 kg/m.  General Appearance: Casual  Eye Contact:  Fair  Speech:  Slow  Volume:  Decreased  Mood:  Depressed  Affect:  Constricted  Thought Process:  Goal Directed  Orientation:  Full (Time, Place, and Person)  Thought Content:  Logical  Suicidal Thoughts:  No  Homicidal Thoughts:  No  Memory:  Immediate;   Fair Recent;   Fair Remote;   Fair  Judgement:  Fair  Insight:  Fair  Psychomotor Activity:  Normal  Concentration:  Concentration: Fair  Recall:  [employer] of Knowledge:  Fair  Language:  Fair  Akathisia:  No  Handed:  Right  AIMS (if indicated):     Assets:  Desire for Improvement Social Support  ADL's:  Intact  Cognition:  WNL  Sleep:  Number of Hours: 7     Treatment Plan Summary: Daily contact with patient to assess and evaluate symptoms and progress in treatment, Medication management and Plan Patient had ECT today which was tolerated without difficulty.  Next ECT Monday.  Possibly reaching a plateau no change to  medicine for tonight.  We are looking for a likely discharge early next week.  Alethia Berthold, MD 02/02/2018, 8:27 PM

## 2018-02-02 NOTE — Progress Notes (Signed)
Recreation Therapy Notes  Date: 02/02/2018  Time: 9:30 am   Location: Craft Room   Behavioral response: N/A   Intervention Topic: Coping Skills  Discussion/Intervention: Patient did not attend group.   Clinical Observations/Feedback:  Patient did not attend group.   Patricio Popwell LRT/CTRS         Marlies Ligman 02/02/2018 11:21 AM

## 2018-02-02 NOTE — Tx Team (Signed)
Interdisciplinary Treatment and Diagnostic Plan Update  01/29/2018 Time of Session: 8:40 AM [name] MRN: [id number]  Principal Diagnosis: Severe recurrent major depression without psychotic features [facility])  Secondary Diagnoses: Principal Problem:   Severe recurrent major depression without psychotic features ([place]) Active Problems:   Catatonia   Current Medications:  Current Facility-Administered Medications  Medication Dose Route Frequency Provider Last Rate Last Dose  . acetaminophen (TYLENOL) tablet 650 mg  650 mg Oral Q6H PRN Clapacs, Madie Reno, MD   650 mg at 01/14/18 2303  . alum & mag hydroxide-simeth (MAALOX/MYLANTA) 200-200-20 MG/5ML suspension 30 mL  30 mL Oral Q4H PRN Clapacs, John T, MD      . feeding supplement (ENSURE ENLIVE) (ENSURE ENLIVE) liquid 237 mL  237 mL Oral BID BM Clapacs, John T, MD   237 mL at 02/01/18 1350  . Gerhardt's butt cream   Topical BID Clapacs, John T, MD      . ibuprofen (ADVIL,MOTRIN) tablet 400 mg  400 mg Oral Q6H PRN Clapacs, Madie Reno, MD   400 mg at 02/01/18 2141  . magnesium hydroxide (MILK OF MAGNESIA) suspension 30 mL  30 mL Oral Daily PRN Clapacs, Madie Reno, MD   30 mL at 01/16/18 0852  . mirtazapine (REMERON) tablet 45 mg  45 mg Oral QHS Clapacs, Madie Reno, MD   45 mg at 02/01/18 2142  . multivitamin with minerals tablet 1 tablet  1 tablet Oral Daily Clapacs, Madie Reno, MD   1 tablet at 02/02/18 1418  . ondansetron (ZOFRAN) injection 4 mg  4 mg Intravenous Once PRN Alvin Critchley, MD      . pantoprazole (PROTONIX) EC tablet 40 mg  40 mg Oral Daily Clapacs, Madie Reno, MD   40 mg at 02/02/18 1418  . promethazine (PHENERGAN) injection 6.25-12.5 mg  6.25-12.5 mg Intravenous Q15 min PRN Arlyss Repress T, MD      . traZODone (DESYREL) tablet 100 mg  100 mg Oral QHS PRN Clapacs, Madie Reno, MD   100 mg at 02/01/18 2141   PTA Medications: Medications Prior to Admission  Medication Sig Dispense Refill Last Dose  . feeding supplement, ENSURE ENLIVE, (ENSURE ENLIVE)  LIQD Take 237 mLs by mouth 2 (two) times daily between meals. 237 mL 12 01/23/2018  . ibuprofen (ADVIL,MOTRIN) 600 MG tablet Take 1 tablet (600 mg total) by mouth every 6 (six) hours as needed for moderate pain. 30 tablet 0 01/23/2018  . Multiple Vitamin (MULTIVITAMIN WITH MINERALS) TABS tablet Take 1 tablet by mouth daily. 30 tablet 0 01/23/2018  . predniSONE (STERAPRED UNI-PAK 21 TAB) 10 MG (21) TBPK tablet Taper by 10 mg daily 21 tablet 0 01/23/2018  . vitamin B-12 1000 MCG tablet Take 1 tablet (1,000 mcg total) by mouth daily. 30 tablet 0 01/23/2018  . Vitamin D, Ergocalciferol, (DRISDOL) 50000 units CAPS capsule Take 1 capsule (50,000 Units total) by mouth every 7 (seven) days. 5 capsule 0 01/23/2018    Patient Stressors:    Patient Strengths:    Treatment Modalities: Medication Management, Group therapy, Case management,  1 to 1 session with clinician, Psychoeducation, Recreational therapy.   Physician Treatment Plan for Primary Diagnosis: Severe recurrent major depression without psychotic features ([place]) Long Term Goal(s): Improvement in symptoms so as ready for discharge Improvement in symptoms so as ready for discharge   Short Term Goals: Ability to demonstrate self-control will improve Ability to identify and develop effective coping behaviors will improve Ability to maintain clinical measurements within normal limits will  improve Compliance with prescribed medications will improve  Medication Management: Evaluate patient's response, side effects, and tolerance of medication regimen.  Therapeutic Interventions: 1 to 1 sessions, Unit Group sessions and Medication administration.  Evaluation of Outcomes: Progressing  Physician Treatment Plan for Secondary Diagnosis: Principal Problem:   Severe recurrent major depression without psychotic features ([place]) Active Problems:   Catatonia  Long Term Goal(s): Improvement in symptoms so as ready for discharge Improvement in symptoms so  as ready for discharge   Short Term Goals: Ability to demonstrate self-control will improve Ability to identify and develop effective coping behaviors will improve Ability to maintain clinical measurements within normal limits will improve Compliance with prescribed medications will improve     Medication Management: Evaluate patient's response, side effects, and tolerance of medication regimen.  Therapeutic Interventions: 1 to 1 sessions, Unit Group sessions and Medication administration.  Evaluation of Outcomes: Progressing   RN Treatment Plan for Primary Diagnosis: Severe recurrent major depression without psychotic features ([place]) Long Term Goal(s): Knowledge of disease and therapeutic regimen to maintain health will improve  Short Term Goals: Ability to identify and develop effective coping behaviors will improve and Compliance with prescribed medications will improve  Medication Management: RN will administer medications as ordered by provider, will assess and evaluate patient's response and provide education to patient for prescribed medication. RN will report any adverse and/or side effects to prescribing provider.  Therapeutic Interventions: 1 on 1 counseling sessions, Psychoeducation, Medication administration, Evaluate responses to treatment, Monitor vital signs and CBGs as ordered, Perform/monitor CIWA, COWS, AIMS and Fall Risk screenings as ordered, Perform wound care treatments as ordered.  Evaluation of Outcomes: Progressing   LCSW Treatment Plan for Primary Diagnosis: Severe recurrent major depression without psychotic features ([place]) Long Term Goal(s): Safe transition to appropriate next level of care at discharge, Engage patient in therapeutic group addressing interpersonal concerns.  Short Term Goals: Engage patient in aftercare planning with referrals and resources, Identify triggers associated with mental health/substance abuse issues and Increase skills for wellness  and recovery  Therapeutic Interventions: Assess for all discharge needs, 1 to 1 time with Social worker, Explore available resources and support systems, Assess for adequacy in community support network, Educate family and significant other(s) on suicide prevention, Complete Psychosocial Assessment, Interpersonal group therapy.  Evaluation of Outcomes: Progressing   Progress in Treatment: Attending groups: No. Participating in groups: No. Taking medication as prescribed: Yes. Toleration medication: Yes. Family/Significant other contact made: Yes, individual(s) contacted:  grandson [place] Patient understands diagnosis: Yes. Discussing patient identified problems/goals with staff: Yes. Medical problems stabilized or resolved: Yes. Denies suicidal/homicidal ideation: Yes. Issues/concerns per patient self-inventory: No. Other:    New problem(s) identified: No, Describe:     New Short Term/Long Term Goal(s):  Patient Goals:  To feel better, have more energy to take car of himself more, be more active again  Discharge Plan or Barriers: Pt continues to receive ECT in hospital.  He is doing a little better, but still displaying lethargy, lack of motivation, low energy, and mental slowing.  On-going possibility of outpatient ECT follow up TBD.  Discharge continues to be for pt to return home to family with outpatient follow up medication management.    Reason for Continuation of Hospitalization: Depression, Continued ECT, Medication management, Coordination of Aftercare.  Estimated Length of Stay: 5-7 days  Recreational Therapy: Patient Stressors: N/A Patient Goal: Patient will engage in groups without prompting or encouragement from LRT x3 group sessions within 5 recreation therapy group sessions  Attendees: Patient: 02/02/2018 3:38 PM  Physician: Dr. Alethia Berthold, MD 02/02/2018 3:38 PM  Nursing: Loel Lofty, RN 02/02/2018 3:38 PM  RN Care Manager: 02/02/2018 3:38 PM  Social Worker:  Derrek Gu, LCSW 02/02/2018 3:38 PM  Recreational Therapist: 02/02/2018 3:38 PM  Other: Darin Engels, [place] 02/02/2018 3:38 PM  Other:  02/02/2018 3:38 PM  Other: 02/02/2018 3:38 PM    Scribe for Treatment Team: Devona Konig, LCSW 02/02/2018 3:38 PM

## 2018-02-02 NOTE — Anesthesia Postprocedure Evaluation (Signed)
Anesthesia Post Note  Patient: [name]  Procedure(s) Performed: ECT TX  Patient location during evaluation: PACU Anesthesia Type: General Level of consciousness: awake and alert Pain management: pain level controlled Vital Signs Assessment: post-procedure vital signs reviewed and stable Respiratory status: spontaneous breathing, nonlabored ventilation and respiratory function stable Cardiovascular status: blood pressure returned to baseline and stable Postop Assessment: no signs of nausea or vomiting Anesthetic complications: no     Last Vitals:  Vitals:   02/02/18 1058 02/02/18 1609  BP: 125/84 97/63  Pulse: 73 67  Resp: (!) 9   Temp: 37.4 C   SpO2: 99%     Last Pain:  Vitals:   02/02/18 1442  TempSrc:   PainSc: 0-No pain                 Bengie Kaucher

## 2018-02-02 NOTE — Transfer of Care (Signed)
Immediate Anesthesia Transfer of Care Note  Patient: [name]  Procedure(s) Performed: ECT TX  Patient Location: PACU  Anesthesia Type:General  Level of Consciousness: drowsy  Airway & Oxygen Therapy: Patient Spontanous Breathing and Patient connected to face mask oxygen  Post-op Assessment: Report given to RN and Post -op Vital signs reviewed and stable  Post vital signs: Reviewed and stable  Last Vitals:  Vitals Value Taken Time  BP 147/82 02/02/2018 10:28 AM  Temp 37 C 02/02/2018 10:28 AM  Pulse 60 02/02/2018 10:30 AM  Resp 12 02/02/2018 10:30 AM  SpO2 100 % 02/02/2018 10:30 AM  Vitals shown include unvalidated device data.  Last Pain:  Vitals:   02/02/18 1028  TempSrc:   PainSc: Asleep      Patients Stated Pain Goal: 0 (51/02/58 5277)  Complications: No apparent anesthesia complications

## 2018-02-02 NOTE — [facility] Group Notes (Signed)

## 2018-02-03 NOTE — Progress Notes (Signed)
[facility] MD Progress Note  02/03/2018 11:12 AM [name]  MRN:  [id number] Subjective:  Pt seen and chart reviewed.  [name] reports feeling "a little better". [name] said that [name] slept well last night and has good appetite.  [name] wants to continue ECT this morning, not sure what future is going to be, but denied any acute stress, and said that [name] is financially ok without working.  No SI.   Principal Problem: Severe recurrent major depression without psychotic features ([place]) Diagnosis:   Patient Active Problem List   Diagnosis Date Noted  . Catatonia [F06.1] 01/12/2018  . Malnutrition of moderate degree [E44.0] 01/09/2018  . Pressure injury of skin [L89.90] 01/09/2018  . Severe recurrent major depression without psychotic features ([place]) [F33.2] 01/09/2018  . Back pain [M54.9] 01/08/2018  . Shuffling gait [R26.89] 01/08/2018  . Depression [F32.9] 01/08/2018   Total Time spent with patient: 20 minutes  Past Psychiatric History: Long-standing depression of several years duration with catatonia  Past Medical History:  Past Medical History:  Diagnosis Date  . Depression     Past Surgical History:  Procedure Laterality Date  . HERNIA REPAIR     Family History: History reviewed. No pertinent family history. Family Psychiatric  History: See previous note Social History:  Social History   Substance and Sexual Activity  Alcohol Use Not on file     Social History   Substance and Sexual Activity  Drug Use Not Currently    Social History   Socioeconomic History  . Marital status: Married    Spouse name: Not on file  . Number of children: Not on file  . Years of education: Not on file  . Highest education level: Not on file  Occupational History  . Not on file  Social Needs  . Financial resource strain: Not on file  . Food insecurity:    Worry: Not on file    Inability: Not on file  . Transportation needs:    Medical: Not on file    Non-medical: Not on file  Tobacco Use  .  Smoking status: Former Research scientist (life sciences)  . Smokeless tobacco: Former Systems developer    Quit date: 01/10/2017  Substance and Sexual Activity  . Alcohol use: Not on file  . Drug use: Not Currently  . Sexual activity: Not Currently  Lifestyle  . Physical activity:    Days per week: Not on file    Minutes per session: Not on file  . Stress: Not on file  Relationships  . Social connections:    Talks on phone: Not on file    Gets together: Not on file    Attends religious service: Not on file    Active member of club or organization: Not on file    Attends meetings of clubs or organizations: Not on file    Relationship status: Not on file  Other Topics Concern  . Not on file  Social History Narrative  . Not on file   Additional Social History:   Sleep: Good  Appetite:  Good  Current Medications: Current Facility-Administered Medications  Medication Dose Route Frequency Provider Last Rate Last Dose  . acetaminophen (TYLENOL) tablet 650 mg  650 mg Oral Q6H PRN Clapacs, Madie Reno, MD   650 mg at 02/02/18 2140  . alum & mag hydroxide-simeth (MAALOX/MYLANTA) 200-200-20 MG/5ML suspension 30 mL  30 mL Oral Q4H PRN Clapacs, John T, MD      . feeding supplement (ENSURE ENLIVE) (ENSURE ENLIVE) liquid 237 mL  237 mL Oral BID BM Clapacs, Madie Reno, MD   237 mL at 02/02/18 2140  . Gerhardt's butt cream   Topical BID Clapacs, John T, MD      . ibuprofen (ADVIL,MOTRIN) tablet 400 mg  400 mg Oral Q6H PRN Clapacs, Madie Reno, MD   400 mg at 02/01/18 2141  . magnesium hydroxide (MILK OF MAGNESIA) suspension 30 mL  30 mL Oral Daily PRN Clapacs, Madie Reno, MD   30 mL at 01/16/18 0852  . mirtazapine (REMERON) tablet 45 mg  45 mg Oral QHS Clapacs, Madie Reno, MD   45 mg at 02/02/18 2140  . multivitamin with minerals tablet 1 tablet  1 tablet Oral Daily Clapacs, Madie Reno, MD   1 tablet at 02/03/18 0902  . ondansetron (ZOFRAN) injection 4 mg  4 mg Intravenous Once PRN Alvin Critchley, MD      . pantoprazole (PROTONIX) EC tablet 40 mg  40 mg Oral  Daily Clapacs, Madie Reno, MD   40 mg at 02/03/18 0902  . promethazine (PHENERGAN) injection 6.25-12.5 mg  6.25-12.5 mg Intravenous Q15 min PRN Arlyss Repress T, MD      . traZODone (DESYREL) tablet 100 mg  100 mg Oral QHS PRN Clapacs, Madie Reno, MD   100 mg at 02/02/18 2140    Lab Results:  Results for orders placed or performed during the hospital encounter of 01/12/18 (from the past 48 hour(s))  Glucose, capillary     Status: None   Collection Time: 02/02/18  6:13 AM  Result Value Ref Range   Glucose-Capillary 83 70 - 99 mg/dL    Blood Alcohol level:  Lab Results  Component Value Date   ETH <10 10/93/2355    Metabolic Disorder Labs: No results found for: HGBA1C, MPG No results found for: PROLACTIN No results found for: CHOL, TRIG, HDL, CHOLHDL, VLDL, LDLCALC  Physical Findings: AIMS: Facial and Oral Movements Muscles of Facial Expression: None, normal Lips and Perioral Area: None, normal Jaw: None, normal Tongue: None, normal,Extremity Movements Upper (arms, wrists, hands, fingers): None, normal Lower (legs, knees, ankles, toes): None, normal, Trunk Movements Neck, shoulders, hips: None, normal, Overall Severity Severity of abnormal movements (highest score from questions above): None, normal Incapacitation due to abnormal movements: None, normal Patient's awareness of abnormal movements (rate only patient's report): No Awareness, Dental Status Current problems with teeth and/or dentures?: No Does patient usually wear dentures?: No  CIWA:    COWS:     Musculoskeletal: Strength & Muscle Tone: decreased Gait & Station: unsteady Patient leans: N/A  Psychiatric Specialty Exam: Physical Exam  Nursing note and vitals reviewed. Constitutional: [name] appears well-developed and well-nourished.  HENT:  Head: Normocephalic and atraumatic.  Eyes: Pupils are equal, round, and reactive to light. Conjunctivae are normal.  Neck: Normal range of motion.  Cardiovascular: Regular rhythm  and normal heart sounds.  Respiratory: Effort normal. No respiratory distress.  GI: Soft.  Musculoskeletal: Normal range of motion.  Neurological: Eris Hannan is alert.  Skin: Skin is warm and dry.  Psychiatric: Judgment normal. His affect is blunt. His speech is delayed. [place] Mchugh is slowed. [place] Petree expresses no suicidal ideation.    Review of Systems  Constitutional: Negative.   HENT: Negative.   Eyes: Negative.   Respiratory: Negative.   Cardiovascular: Negative.   Gastrointestinal: Negative.   Musculoskeletal: Negative.   Skin: Negative.   Neurological: Negative.   Psychiatric/Behavioral: Negative for depression, hallucinations, memory loss, substance abuse and suicidal ideas. The patient is not nervous/anxious and does  not have insomnia.     Blood pressure 116/69, pulse 72, temperature (!) 97.5 F (36.4 C), temperature source Oral, resp. rate 16, height 5\' 10"  (1.778 m), weight 73.5 kg (162 lb), SpO2 100 %.Body mass index is 23.24 kg/m.  General Appearance: Fairly Groomed  Eye Contact:  Good  Speech:  Slow  Volume:  Normal  Mood:  Depressed  Affect:  Blunt, Congruent, Constricted and Restricted  Thought Process:  Coherent and Goal Directed  Orientation:  Full (Time, Place, and Person)  Thought Content:  Logical  Suicidal Thoughts:  No  Homicidal Thoughts:  No  Memory:  Immediate;   Fair Recent;   Fair Remote;   Fair  Judgement:  Intact  Insight:  Good  Psychomotor Activity:  Decreased  Concentration:  Concentration: Fair and Attention Span: Fair  Recall:  [employer] of Knowledge:  Good  Language:  Good  Akathisia:  No  Handed:  Right  AIMS (if indicated):     Assets:  Desire for Improvement Housing Physical Health Social Support  ADL's:  Intact  Cognition:  WNL  Sleep:  Number of Hours: 7.25     Treatment Plan Summary: Daily contact with patient to assess and evaluate symptoms and progress in treatment and Medication management   MDD, severe -- continue ECT. So far Cain Fitzhenry has  had 9 ECT, and tolerates well. Plan to continue ECT on Monday.  -- continue Remeron 45mg  qhs.   Discharge plan -- defer to primary team next week.   Torie Towle, MD 02/03/2018, 11:12 AM

## 2018-02-03 NOTE — Progress Notes (Signed)
Received [name] this AM in [place] after breakfast, he was compliant with his medications.He continued to endorsed feeling better, but his self inventory sheets indicated depression, annxiety and hopelessness which was rated 6/10. He complained of pain, but indicated he will let this writer know in he needs pain medication. He has been OOB in the milieu most of the day, talking on the phone ant intervals.

## 2018-02-03 NOTE — [facility] Group Notes (Signed)
LCSW Group Therapy Note  02/03/2018 1:15pm  Type of Therapy and Topic:  Group Therapy:  Cognitive Distortions  Participation Level:  Active   Description of Group:    Patients in this group will be introduced to the topic of cognitive distortions.  Patients will identify and describe cognitive distortions, describe the feelings these distortions create for them.  Patients will identify one or more situations in their personal life where they have cognitively distorted thinking and will verbalize challenging this cognitive distortion through positive thinking skills.  Patients will practice the skill of using positive affirmations to challenge cognitive distortions using affirmation cards.    Therapeutic Goals:  1. Patient will identify two or more cognitive distortions they have used 2. Patient will identify one or more emotions that stem from use of a cognitive distortion 3. Patient will demonstrate use of a positive affirmation to counter a cognitive distortion through discussion and/or role play. 4. Patient will describe one way cognitive distortions can be detrimental to wellness   Summary of Patient Progress: The patient scored his mood between 1-3 (1 best).. Patients were introduced to the topic of cognitive distortions. The patient was able to identify and describe cognitive distortions, described the feelings these distortions create for him. Patient identified a situation in his personal life where he has cognitively distorted thinking and was able to verbalize and challenged this cognitive distortion through positive thinking skills. Patient was able to provide support and validation to other group members.      Therapeutic Modalities:   Cognitive Behavioral Therapy Motivational Interviewing   Laqueena Hinchey  CUEBAS-COLON, LCSW 02/03/2018 3:50 PM

## 2018-02-04 ENCOUNTER — Other Ambulatory Visit: Payer: Self-pay | Admitting: Psychiatry

## 2018-02-04 NOTE — [facility] Group Notes (Signed)
LCSW Group Therapy Note 02/04/2018 1:15pm  Type of Therapy and Topic: Group Therapy: Feelings Around Returning Home & Establishing a Supportive Framework and Supporting Oneself When Supports Not Available  Participation Level: Active  Description of Group:  Patients first processed thoughts and feelings about upcoming discharge. These included fears of upcoming changes, lack of change, new living environments, judgements and expectations from others and overall stigma of mental health issues. The group then discussed the definition of a supportive framework, what that looks and feels like, and how do to discern it from an unhealthy non-supportive network. The group identified different types of supports as well as what to do when your family/friends are less than helpful or unavailable  Therapeutic Goals  1. Patient will identify one healthy supportive network that they can use at discharge. 2. Patient will identify one factor of a supportive framework and how to tell it from an unhealthy network. 3. Patient able to identify one coping skill to use when they do not have positive supports from others. 4. Patient will demonstrate ability to communicate their needs through discussion and/or role plays.  Summary of Patient Progress:  Patient reported he feels "good." Pt engaged during group session. As patients processed their anxiety about discharge and described healthy supports patient shared he think he is ready to be discharge. Patient listed his grandson as his main support.  Patients identified at least one self-care tool they were willing to use after discharge.   Therapeutic Modalities Cognitive Behavioral Therapy Motivational Interviewing   Roxie Kreeger  CUEBAS-COLON, LCSW 02/04/2018 9:46 AM

## 2018-02-04 NOTE — Progress Notes (Signed)
Received Arby this AM in the dinning room eating his breakfast, he ate a second breakfast. He continued to endorsed feeling anxious,depressed and hopelessness although he feels its better. His affect is brighter with direct eye contact. Butt cream wqas applied to the affected area.

## 2018-02-04 NOTE — Progress Notes (Signed)
[facility] MD Progress Note  02/04/2018 11:27 AM [name]  MRN:  [id number] Subjective:  Pt seen and chart reviewed. [name] continues to report feeling well, no SI.  [name] slept well last night with his medicine, and has no complaint today.  Willing to continue ECT.    Principal Problem: Severe recurrent major depression without psychotic features ([place]) Diagnosis:   Patient Active Problem List   Diagnosis Date Noted  . Catatonia [F06.1] 01/12/2018  . Malnutrition of moderate degree [E44.0] 01/09/2018  . Pressure injury of skin [L89.90] 01/09/2018  . Severe recurrent major depression without psychotic features ([place]) [F33.2] 01/09/2018  . Back pain [M54.9] 01/08/2018  . Shuffling gait [R26.89] 01/08/2018  . Depression [F32.9] 01/08/2018   Total Time spent with patient: 15 minutes  Past Psychiatric History: Long-standing depression of several years duration with catatonia  Past Medical History:  Past Medical History:  Diagnosis Date  . Depression     Past Surgical History:  Procedure Laterality Date  . HERNIA REPAIR     Family History: History reviewed. No pertinent family history. Family Psychiatric  History: See previous note Social History:  Social History   Substance and Sexual Activity  Alcohol Use Not on file     Social History   Substance and Sexual Activity  Drug Use Not Currently    Social History   Socioeconomic History  . Marital status: Married    Spouse name: Not on file  . Number of children: Not on file  . Years of education: Not on file  . Highest education level: Not on file  Occupational History  . Not on file  Social Needs  . Financial resource strain: Not on file  . Food insecurity:    Worry: Not on file    Inability: Not on file  . Transportation needs:    Medical: Not on file    Non-medical: Not on file  Tobacco Use  . Smoking status: Former Research scientist (life sciences)  . Smokeless tobacco: Former Systems developer    Quit date: 01/10/2017  Substance and Sexual Activity  .  Alcohol use: Not on file  . Drug use: Not Currently  . Sexual activity: Not Currently  Lifestyle  . Physical activity:    Days per week: Not on file    Minutes per session: Not on file  . Stress: Not on file  Relationships  . Social connections:    Talks on phone: Not on file    Gets together: Not on file    Attends religious service: Not on file    Active member of club or organization: Not on file    Attends meetings of clubs or organizations: Not on file    Relationship status: Not on file  Other Topics Concern  . Not on file  Social History Narrative  . Not on file   Additional Social History:   Sleep: Good  Appetite:  Good  Current Medications: Current Facility-Administered Medications  Medication Dose Route Frequency Provider Last Rate Last Dose  . acetaminophen (TYLENOL) tablet 650 mg  650 mg Oral Q6H PRN Clapacs, Madie Reno, MD   650 mg at 02/02/18 2140  . alum & mag hydroxide-simeth (MAALOX/MYLANTA) 200-200-20 MG/5ML suspension 30 mL  30 mL Oral Q4H PRN Clapacs, John T, MD      . feeding supplement (ENSURE ENLIVE) (ENSURE ENLIVE) liquid 237 mL  237 mL Oral BID BM Clapacs, John T, MD   237 mL at 02/03/18 1400  . Gerhardt's butt cream  Topical BID Clapacs, John T, MD      . ibuprofen (ADVIL,MOTRIN) tablet 400 mg  400 mg Oral Q6H PRN Clapacs, Madie Reno, MD   400 mg at 02/01/18 2141  . magnesium hydroxide (MILK OF MAGNESIA) suspension 30 mL  30 mL Oral Daily PRN Clapacs, Madie Reno, MD   30 mL at 01/16/18 0852  . mirtazapine (REMERON) tablet 45 mg  45 mg Oral QHS Clapacs, Madie Reno, MD   45 mg at 02/03/18 2146  . multivitamin with minerals tablet 1 tablet  1 tablet Oral Daily Clapacs, Madie Reno, MD   1 tablet at 02/04/18 0817  . ondansetron (ZOFRAN) injection 4 mg  4 mg Intravenous Once PRN Alvin Critchley, MD      . pantoprazole (PROTONIX) EC tablet 40 mg  40 mg Oral Daily Clapacs, Madie Reno, MD   40 mg at 02/04/18 0817  . promethazine (PHENERGAN) injection 6.25-12.5 mg  6.25-12.5 mg  Intravenous Q15 min PRN Arlyss Repress T, MD      . traZODone (DESYREL) tablet 100 mg  100 mg Oral QHS PRN Clapacs, Madie Reno, MD   100 mg at 02/02/18 2140    Lab Results:  No results found for this or any previous visit (from the past 48 hour(s)).  Blood Alcohol level:  Lab Results  Component Value Date   ETH <10 78/93/8101    Metabolic Disorder Labs: No results found for: HGBA1C, MPG No results found for: PROLACTIN No results found for: CHOL, TRIG, HDL, CHOLHDL, VLDL, LDLCALC  Physical Findings: AIMS: Facial and Oral Movements Muscles of Facial Expression: None, normal Lips and Perioral Area: None, normal Jaw: None, normal Tongue: None, normal,Extremity Movements Upper (arms, wrists, hands, fingers): None, normal Lower (legs, knees, ankles, toes): None, normal, Trunk Movements Neck, shoulders, hips: None, normal, Overall Severity Severity of abnormal movements (highest score from questions above): None, normal Incapacitation due to abnormal movements: None, normal Patient's awareness of abnormal movements (rate only patient's report): No Awareness, Dental Status Current problems with teeth and/or dentures?: No Does patient usually wear dentures?: No  CIWA:    COWS:     Musculoskeletal: Strength & Muscle Tone: decreased Gait & Station: unsteady Patient leans: N/A  Psychiatric Specialty Exam: Physical Exam  Nursing note and vitals reviewed. Constitutional: [name] appears well-developed and well-nourished.  HENT:  Head: Normocephalic and atraumatic.  Eyes: Pupils are equal, round, and reactive to light. Conjunctivae are normal.  Neck: Normal range of motion.  Cardiovascular: Regular rhythm and normal heart sounds.  Respiratory: Effort normal. No respiratory distress.  GI: Soft.  Musculoskeletal: Normal range of motion.  Neurological: [name] is alert.  Skin: Skin is warm and dry.  Psychiatric: Judgment normal. His affect is blunt. His speech is delayed. [name] is slowed. [name]  expresses no suicidal ideation.    Review of Systems  Constitutional: Negative.   HENT: Negative.   Eyes: Negative.   Respiratory: Negative.   Cardiovascular: Negative.   Gastrointestinal: Negative.   Musculoskeletal: Negative.   Skin: Negative.   Neurological: Negative.   Psychiatric/Behavioral: Negative for depression, hallucinations, memory loss, substance abuse and suicidal ideas. The patient is not nervous/anxious and does not have insomnia.     Blood pressure 127/72, pulse 67, temperature 97.8 F (36.6 C), temperature source Oral, resp. rate 16, height 5\' 10"  (1.778 m), weight 73.5 kg (162 lb), SpO2 100 %.Body mass index is 23.24 kg/m.  General Appearance: Fairly Groomed  Eye Contact:  Good  Speech:  Normal Rate  Volume:  Normal  Mood:  Euthymic  Affect:  Congruent, Constricted and Depressed  Thought Process:  Coherent, Goal Directed and Linear  Orientation:  Full (Time, Place, and Person)  Thought Content:  Logical  Suicidal Thoughts:  No  Homicidal Thoughts:  No  Memory:  Immediate;   Fair Recent;   Fair Remote;   Fair  Judgement:  Intact  Insight:  Good  Psychomotor Activity:  Normal  Concentration:  Concentration: Fair and Attention Span: Fair  Recall:  [employer] of Knowledge:  Good  Language:  Fair  Akathisia:  No  Handed:  Right  AIMS (if indicated):     Assets:  Desire for Improvement Housing Physical Health Social Support  ADL's:  Intact  Cognition:  WNL  Sleep:  Number of Hours: 7.45     Treatment Plan Summary: Daily contact with patient to assess and evaluate symptoms and progress in treatment and Medication management   MDD, severe -- continue ECT. So far [name] has had 9 ECT, and tolerates well. Plan to continue ECT on Monday.  -- continue Remeron 45mg  qhs.  -- continue to provide Trazodone 100mg  qhs prn, but slept well without it yesterday.   Discharge plan -- defer to primary team next week.   Arthur Hofman, MD 02/04/2018, 11:27 AM

## 2018-02-04 NOTE — Plan of Care (Signed)
Patient has improved greatly and is doing well socializing in the unit , and cooperating with his medical regimen without any noticeable side effects , patient is sleeping long hours, denies any  SI/HI at this time no AVH  noted, 15 minute rounding in progress. Problem: Activity: Goal: Interest or engagement in activities will improve Outcome: Progressing Goal: Sleeping patterns will improve Outcome: Progressing   Problem: Education: Goal: Utilization of techniques to improve thought processes will improve Outcome: Progressing   Problem: Activity: Goal: Interest or engagement in leisure activities will improve Outcome: Progressing   Problem: Coping: Goal: Will verbalize feelings Outcome: Progressing   Problem: Safety: Goal: Ability to disclose and discuss suicidal ideas will improve Outcome: Progressing   Problem: Education: Goal: Knowledge of General Education information will improve Outcome: Progressing

## 2018-02-05 ENCOUNTER — Inpatient Hospital Stay: Payer: BLUE CROSS/BLUE SHIELD | Admitting: Anesthesiology

## 2018-02-05 LAB — GLUCOSE, CAPILLARY: Glucose-Capillary: 89 mg/dL (ref 70–99)

## 2018-02-05 MED ORDER — ONDANSETRON HCL 4 MG/2ML IJ SOLN: 4.0000 mg | Freq: Once | INTRAMUSCULAR | Status: DC | PRN

## 2018-02-05 MED ORDER — SODIUM CHLORIDE 0.9 % IV SOLN
INTRAVENOUS | Status: DC | PRN
  Administered 2018-02-05: 09:00:00 via INTRAVENOUS

## 2018-02-05 MED ORDER — KETAMINE HCL 10 MG/ML IJ SOLN
INTRAMUSCULAR | Status: DC | PRN
  Administered 2018-02-05: 100 mg via INTRAVENOUS

## 2018-02-05 MED ORDER — KETAMINE HCL 50 MG/ML IJ SOLN
INTRAMUSCULAR | Status: AC
  Filled 2018-02-05: qty 10

## 2018-02-05 MED ORDER — SUCCINYLCHOLINE CHLORIDE 200 MG/10ML IV SOSY
PREFILLED_SYRINGE | INTRAVENOUS | Status: DC | PRN
  Administered 2018-02-05: 80 mg via INTRAVENOUS

## 2018-02-05 MED ORDER — KETOROLAC TROMETHAMINE 30 MG/ML IJ SOLN
30.0000 mg | Freq: Once | INTRAMUSCULAR | Status: AC
  Administered 2018-02-05: 30 mg via INTRAVENOUS

## 2018-02-05 MED ORDER — KETOROLAC TROMETHAMINE 30 MG/ML IJ SOLN
INTRAMUSCULAR | Status: AC
  Administered 2018-02-05: 30 mg via INTRAVENOUS
  Filled 2018-02-05: qty 1

## 2018-02-05 MED ORDER — SODIUM CHLORIDE 0.9 % IV SOLN
500.0000 mL | Freq: Once | INTRAVENOUS | Status: AC
  Administered 2018-02-05: 500 mL via INTRAVENOUS

## 2018-02-05 NOTE — [facility] Group Notes (Signed)
LCSW Group Therapy Note   02/05/2018 1:00pm   Type of Therapy and Topic:  Group Therapy:  Overcoming Obstacles   Participation Level:  Did Not Attend   Description of Group:    In this group patients will be encouraged to explore what they see as obstacles to their own wellness and recovery. They will be guided to discuss their thoughts, feelings, and behaviors related to these obstacles. The group will process together ways to cope with barriers, with attention given to specific choices patients can make. Each patient will be challenged to identify changes they are motivated to make in order to overcome their obstacles. This group will be process-oriented, with patients participating in exploration of their own experiences as well as giving and receiving support and challenge from other group members.   Therapeutic Goals: 1. Patient will identify personal and current obstacles as they relate to admission. 2. Patient will identify barriers that currently interfere with their wellness or overcoming obstacles.  3. Patient will identify feelings, thought process and behaviors related to these barriers. 4. Patient will identify two changes they are willing to make to overcome these obstacles:      Summary of Patient Progress      Therapeutic Modalities:   Cognitive Behavioral Therapy Solution Focused Therapy Motivational Interviewing Relapse Prevention Therapy  August Saucer, LCSW 02/05/2018 2:16 PM

## 2018-02-05 NOTE — Plan of Care (Signed)
Patient tolerated ECT well.Patient states "I am not too bad."Patient is calm and cooperative on approach.Isolates in the room most of the time.Compliant with medications.Appetite and energy level good.Support and encouragement given.

## 2018-02-05 NOTE — Anesthesia Postprocedure Evaluation (Signed)
Anesthesia Post Note  Patient: [name]  Procedure(s) Performed: ECT TX  Patient location during evaluation: PACU Anesthesia Type: General Level of consciousness: sedated Pain management: pain level controlled Vital Signs Assessment: post-procedure vital signs reviewed and stable Respiratory status: spontaneous breathing and respiratory function stable Cardiovascular status: stable Anesthetic complications: no     Last Vitals:  Vitals:   02/05/18 1047 02/05/18 1057  BP: 140/90 (!) 148/86  Pulse: 78 74  Resp: 12 13  Temp:    SpO2: 100% 100%    Last Pain:  Vitals:   02/05/18 1057  TempSrc:   PainSc: 0-No pain                 Kaisen Ackers K

## 2018-02-05 NOTE — Plan of Care (Addendum)
Patient found in bed awake upon my arrival. Patient is visible but not social this evening. Pleasant and calm upon approach. Mood is somewhat depressed and anxious but manageable. Affect is animated. Patient is conversational. Gait is increasingly steady. Denies SI/HI/AVH. Denies pain. Reports eating and voiding adequately. Educated/reminded regarding NPO after midnight status. Verbalized understanding. Thought processes are impaired. Requires reinforcement of information. Compliant with HS medication and staff direction. Q 15 minute checks maintained. Will continue to monitor throughout the shift. @2300 , patient awoke with some confusion. When questioned, thought blocking apparent. Patient attempted to say something for at least 5-10 minutes. Then spoke about bathing suits at the beach. Returned patient to bed. Will continue to monitor. @0200 , Patient is up again, worrying about "the machine that prints off bathing suits." Reports, "They should be in a box." Unable to discern what patient is talking about, Probation officer suggested he return to bed. Patient complied. Patient slept 6 hours. Restless. NPO status maintained. CBG 89. Will endorse care to oncoming shift.  Problem: Activity: Goal: Interest or engagement in activities will improve Outcome: Progressing Goal: Sleeping patterns will improve Outcome: Progressing   Problem: Activity: Goal: Interest or engagement in leisure activities will improve Outcome: Progressing   Problem: Coping: Goal: Will verbalize feelings Outcome: Progressing

## 2018-02-05 NOTE — Procedures (Signed)
ECT SERVICES Physician's Interval Evaluation & Treatment Note  Patient Identification: [name] MRN:  [id number] Date of Evaluation:  02/05/2018 TX #: 10  MADRS: 25  MMSE: 29  P.E. Findings:  No significant change  Psychiatric Interval Note:  Doing well still a little slow and sluggish and quiet but vastly better than when he came in.  Subjective:  Patient is a 62 y.o. male seen for evaluation for Electroconvulsive Therapy. No specific complaint  Treatment Summary:   []   Right Unilateral             [x]  Bilateral   % Energy : 1.0 ms 100%   Impedance: 1400 ohms  Seizure Energy Index: 2998 V squared  Postictal Suppression Index: 88%  Seizure Concordance Index: 88%  Medications  Pre Shock: Ketamine 100 mg succinylcholine 80 mg  Post Shock:    Seizure Duration: 22 seconds by EMG 45 seconds by EEG   Comments: Auto up Wednesday  Lungs:  [x]   Clear to auscultation               []  Other:   Heart:    [x]   Regular rhythm             []  irregular rhythm    [x]   Previous H&P reviewed, patient examined and there are NO CHANGES                 []   Previous H&P reviewed, patient examined and there are changes noted.   Alethia Berthold, MD 7/29/201910:17 AM

## 2018-02-05 NOTE — Anesthesia Post-op Follow-up Note (Signed)
Anesthesia QCDR form completed.        

## 2018-02-05 NOTE — Transfer of Care (Signed)
Immediate Anesthesia Transfer of Care Note  Patient: [name]  Procedure(s) Performed: ECT TX  Patient Location: PACU  Anesthesia Type:General  Level of Consciousness: sedated  Airway & Oxygen Therapy: Patient Spontanous Breathing and Patient connected to face mask oxygen  Post-op Assessment: Report given to RN and Post -op Vital signs reviewed and stable  Post vital signs: Reviewed and stable  Last Vitals:  Vitals Value Taken Time  BP 145/94 02/05/2018 10:37 AM  Temp    Pulse 73 02/05/2018 10:37 AM  Resp 10 02/05/2018 10:37 AM  SpO2 100 % 02/05/2018 10:37 AM  Vitals shown include unvalidated device data.  Last Pain:  Vitals:   02/05/18 0931  TempSrc: Oral  PainSc: 0-No pain      Patients Stated Pain Goal: 0 (17/53/01 0404)  Complications: No apparent anesthesia complications

## 2018-02-05 NOTE — Progress Notes (Signed)
Recreation Therapy Notes  Date: 02/05/2018  Time: 9:30 am   Location: Craft Room   Behavioral response: N/A   Intervention Topic: Self-esteem  Discussion/Intervention: Patient did not attend group.   Clinical Observations/Feedback:  Patient did not attend group.   Delanna Blacketer LRT/CTRS        Josephine Rudnick 02/05/2018 11:26 AM

## 2018-02-05 NOTE — Anesthesia Preprocedure Evaluation (Signed)
Anesthesia Evaluation  Patient identified by MRN, date of birth, ID band Patient awake    Reviewed: Allergy & Precautions, NPO status , Patient's Chart, lab work & pertinent test results  History of Anesthesia Complications Negative for: history of anesthetic complications  Airway Mallampati: III  TM Distance: >3 FB Neck ROM: Full    Dental no notable dental hx.    Pulmonary neg sleep apnea, neg COPD, former smoker,    breath sounds clear to auscultation- rhonchi (-) wheezing      Cardiovascular Exercise Tolerance: Good (-) hypertension(-) CAD, (-) Past MI, (-) Cardiac Stents and (-) CABG  Rhythm:Regular Rate:Normal - Systolic murmurs and - Diastolic murmurs    Neuro/Psych PSYCHIATRIC DISORDERS Depression negative neurological ROS     GI/Hepatic negative GI ROS, Neg liver ROS,   Endo/Other  negative endocrine ROSneg diabetes  Renal/GU negative Renal ROS     Musculoskeletal negative musculoskeletal ROS (+)   Abdominal (+) - obese,   Peds  Hematology negative hematology ROS (+)   Anesthesia Other Findings Past Medical History: No date: Depression   Reproductive/Obstetrics                             Anesthesia Physical  Anesthesia Plan  ASA: II  Anesthesia Plan: General   Post-op Pain Management:    Induction: Intravenous  PONV Risk Score and Plan: 1 and Ondansetron  Airway Management Planned: Mask  Additional Equipment:   Intra-op Plan:   Post-operative Plan:   Informed Consent: I have reviewed the patients History and Physical, chart, labs and discussed the procedure including the risks, benefits and alternatives for the proposed anesthesia with the patient or authorized representative who has indicated his/her understanding and acceptance.   Dental advisory given  Plan Discussed with: CRNA and Anesthesiologist  Anesthesia Plan Comments:         Anesthesia  Quick Evaluation

## 2018-02-05 NOTE — H&P (Signed)
[name] is an 62 y.o. male.   Chief Complaint: No specific chief complaint. HPI: Patient with severe long-standing depression that has shown improvement with medication and ECT treatment  Past Medical History:  Diagnosis Date  . Depression     Past Surgical History:  Procedure Laterality Date  . HERNIA REPAIR      History reviewed. No pertinent family history. Social History:  reports that he has quit smoking. He quit smokeless tobacco use about 12 months ago. He reports that he has current or past drug history. His alcohol history is not on file.  Allergies: No Known Allergies  Medications Prior to Admission  Medication Sig Dispense Refill  . feeding supplement, ENSURE ENLIVE, (ENSURE ENLIVE) LIQD Take 237 mLs by mouth 2 (two) times daily between meals. 237 mL 12  . ibuprofen (ADVIL,MOTRIN) 600 MG tablet Take 1 tablet (600 mg total) by mouth every 6 (six) hours as needed for moderate pain. 30 tablet 0  . Multiple Vitamin (MULTIVITAMIN WITH MINERALS) TABS tablet Take 1 tablet by mouth daily. 30 tablet 0  . predniSONE (STERAPRED UNI-PAK 21 TAB) 10 MG (21) TBPK tablet Taper by 10 mg daily 21 tablet 0  . vitamin B-12 1000 MCG tablet Take 1 tablet (1,000 mcg total) by mouth daily. 30 tablet 0  . Vitamin D, Ergocalciferol, (DRISDOL) 50000 units CAPS capsule Take 1 capsule (50,000 Units total) by mouth every 7 (seven) days. 5 capsule 0    Results for orders placed or performed during the hospital encounter of 01/12/18 (from the past 48 hour(s))  Glucose, capillary     Status: None   Collection Time: 02/05/18  6:22 AM  Result Value Ref Range   Glucose-Capillary 89 70 - 99 mg/dL   No results found.  Review of Systems  Constitutional: Negative.   HENT: Negative.   Eyes: Negative.   Respiratory: Negative.   Cardiovascular: Negative.   Gastrointestinal: Negative.   Musculoskeletal: Negative.   Skin: Negative.   Neurological: Negative.   Psychiatric/Behavioral: Negative.      Blood pressure 130/84, pulse 81, temperature 98.6 F (37 C), temperature source Oral, resp. rate 18, height 5\' 10"  (1.778 m), weight 73.5 kg (162 lb), SpO2 98 %. Physical Exam  Nursing note and vitals reviewed. Constitutional: He appears well-developed and well-nourished.  HENT:  Head: Normocephalic and atraumatic.  Eyes: Pupils are equal, round, and reactive to light. Conjunctivae are normal.  Neck: Normal range of motion.  Cardiovascular: Regular rhythm and normal heart sounds.  Respiratory: Effort normal.  GI: Soft.  Musculoskeletal: Normal range of motion.  Neurological: He is alert.  Skin: Skin is warm and dry.  Psychiatric: Judgment normal. His affect is blunt. His speech is delayed. He is slowed. Thought content is not paranoid. Cognition and memory are normal. He expresses no homicidal and no suicidal ideation.     Assessment/Plan Treatment again today we are anticipating a possible discharge within the next couple days although I think we will continue the course of index ECT as long as we were continuing to see improvement and good tolerance.  Alethia Berthold, MD 02/05/2018, 10:16 AM

## 2018-02-05 NOTE — Anesthesia Procedure Notes (Signed)
Date/Time: 02/05/2018 10:26 AM Performed by: Dionne Bucy, CRNA Pre-anesthesia Checklist: Patient identified, Emergency Drugs available, Suction available and Patient being monitored Patient Re-evaluated:Patient Re-evaluated prior to induction Oxygen Delivery Method: Circle system utilized Preoxygenation: Pre-oxygenation with 100% oxygen Induction Type: IV induction Ventilation: Mask ventilation without difficulty and Mask ventilation throughout procedure Airway Equipment and Method: Bite block Placement Confirmation: positive ETCO2 Dental Injury: Teeth and Oropharynx as per pre-operative assessment

## 2018-02-06 ENCOUNTER — Other Ambulatory Visit: Payer: Self-pay | Admitting: Psychiatry

## 2018-02-06 NOTE — [facility] Group Notes (Signed)
02/06/2018 1PM  Type of Therapy/Topic:  Group Therapy:  Feelings about Diagnosis  Participation Level:  Did Not Attend   Description of Group:   This group will allow patients to explore their thoughts and feelings about diagnoses they have received. Patients will be guided to explore their level of understanding and acceptance of these diagnoses. Facilitator will encourage patients to process their thoughts and feelings about the reactions of others to their diagnosis and will guide patients in identifying ways to discuss their diagnosis with significant others in their lives. This group will be process-oriented, with patients participating in exploration of their own experiences, giving and receiving support, and processing challenge from other group members.   Therapeutic Goals: 1. Patient will demonstrate understanding of diagnosis as evidenced by identifying two or more symptoms of the disorder 2. Patient will be able to express two feelings regarding the diagnosis 3. Patient will demonstrate their ability to communicate their needs through discussion and/or role play  Summary of Patient Progress: Patient was encouraged and invited to attend group. Patient did not attend group. Social worker will continue to encourage group participation in the future.        Therapeutic Modalities:   Cognitive Behavioral Therapy Brief Therapy Feelings Identification    Darin Engels, Marlinda Mike 02/06/2018 4:36 PM

## 2018-02-06 NOTE — Progress Notes (Signed)
Patient ID: [name], male   DOB: 03/08/1956, 62 y.o.   MRN: [id number] CSW spoke with Pt's grandson Anastasia Pall who informs CSW that they are moving into a new apartment that wont be ready until 8/10 and that he will be out of town for work until 8/10.  He says he will not be able to be home with Pt at discharge and is trying to figure out a good plan.  CSW informed him discharge is tentatively planned for Friday 02/09/18.  He plans to discuss with Pt's wife and see if she will be able/willing to assist in care of Pt.  CSW informed him that Dr. Weber Cooks was not likely planning to continue outpatient ECT at this time so he would only have 1 or 2 at the max appointments while he was out of town.  CSW made follow up appointment with [facility] per Pt request.  Pt's grandson agreed to keep in touch about plans. He plans to reach out to other friends and family to discuss how they are willing to help.  He does not want Pt to return to his house as he thought was a trigger for excessive worry and irrational fears.  He expressed gratitude for [facility] team for the progress his grandfather has made and agrees to keep in touch about his plan.  Dossie Arbour, LCSW

## 2018-02-06 NOTE — Progress Notes (Signed)
[facility] MD Progress Note  02/06/2018 7:59 PM [name]  MRN:  [id number] Subjective: "I guess I am okay" patient seen for follow-up.  I have seen him today up dressed taking care of his ADLs talking on the telephone eating.  Patient tells me he is feeling better.  His speech is still slow and his activities in general and thinking are slow but he denies feeling depressed no suicidal ideation no psychosis.  Mild complaints of some memory problems. Principal Problem: Severe recurrent major depression without psychotic features ([place]) Diagnosis:   Patient Active Problem List   Diagnosis Date Noted  . Catatonia [F06.1] 01/12/2018  . Malnutrition of moderate degree [E44.0] 01/09/2018  . Pressure injury of skin [L89.90] 01/09/2018  . Severe recurrent major depression without psychotic features ([place]) [F33.2] 01/09/2018  . Back pain [M54.9] 01/08/2018  . Shuffling gait [R26.89] 01/08/2018  . Depression [F32.9] 01/08/2018   Total Time spent with patient: 20 minutes  Past Psychiatric History: Patient has a history of long-standing depression see prior notes  Past Medical History:  Past Medical History:  Diagnosis Date  . Depression     Past Surgical History:  Procedure Laterality Date  . HERNIA REPAIR     Family History: History reviewed. No pertinent family history. Family Psychiatric  History: See previous note Social History:  Social History   Substance and Sexual Activity  Alcohol Use Not on file     Social History   Substance and Sexual Activity  Drug Use Not Currently    Social History   Socioeconomic History  . Marital status: Married    Spouse name: Not on file  . Number of children: Not on file  . Years of education: Not on file  . Highest education level: Not on file  Occupational History  . Not on file  Social Needs  . Financial resource strain: Not on file  . Food insecurity:    Worry: Not on file    Inability: Not on file  . Transportation needs:   Medical: Not on file    Non-medical: Not on file  Tobacco Use  . Smoking status: Former Research scientist (life sciences)  . Smokeless tobacco: Former Systems developer    Quit date: 01/10/2017  Substance and Sexual Activity  . Alcohol use: Not on file  . Drug use: Not Currently  . Sexual activity: Not Currently  Lifestyle  . Physical activity:    Days per week: Not on file    Minutes per session: Not on file  . Stress: Not on file  Relationships  . Social connections:    Talks on phone: Not on file    Gets together: Not on file    Attends religious service: Not on file    Active member of club or organization: Not on file    Attends meetings of clubs or organizations: Not on file    Relationship status: Not on file  Other Topics Concern  . Not on file  Social History Narrative  . Not on file   Additional Social History:                         Sleep: Good  Appetite:  Good  Current Medications: Current Facility-Administered Medications  Medication Dose Route Frequency Provider Last Rate Last Dose  . acetaminophen (TYLENOL) tablet 650 mg  650 mg Oral Q6H PRN Jenesis Suchy, Madie Reno, MD   650 mg at 02/02/18 2140  . alum & mag hydroxide-simeth (MAALOX/MYLANTA) 200-200-20  MG/5ML suspension 30 mL  30 mL Oral Q4H PRN Orva Gwaltney T, MD      . feeding supplement (ENSURE ENLIVE) (ENSURE ENLIVE) liquid 237 mL  237 mL Oral BID BM Kalanie Fewell T, MD   237 mL at 02/06/18 1500  . Gerhardt's butt cream   Topical BID Bomani Oommen T, MD      . ibuprofen (ADVIL,MOTRIN) tablet 400 mg  400 mg Oral Q6H PRN Notnamed Croucher, Madie Reno, MD   400 mg at 02/06/18 1459  . magnesium hydroxide (MILK OF MAGNESIA) suspension 30 mL  30 mL Oral Daily PRN Davidson Palmieri, Madie Reno, MD   30 mL at 01/16/18 0852  . mirtazapine (REMERON) tablet 45 mg  45 mg Oral QHS Emogene Muratalla, Madie Reno, MD   45 mg at 02/05/18 2053  . multivitamin with minerals tablet 1 tablet  1 tablet Oral Daily Marlinda Miranda, Madie Reno, MD   1 tablet at 02/06/18 0820  . ondansetron (ZOFRAN) injection 4 mg  4  mg Intravenous Once PRN Alvin Critchley, MD      . ondansetron [facility]) injection 4 mg  4 mg Intravenous Once PRN Gunnar Fusi, MD      . pantoprazole (PROTONIX) EC tablet 40 mg  40 mg Oral Daily Kellis Topete, Madie Reno, MD   40 mg at 02/06/18 0820  . promethazine (PHENERGAN) injection 6.25-12.5 mg  6.25-12.5 mg Intravenous Q15 min PRN Arlyss Repress T, MD      . traZODone (DESYREL) tablet 100 mg  100 mg Oral QHS PRN Beckhem Isadore, Madie Reno, MD   100 mg at 02/05/18 2054    Lab Results:  Results for orders placed or performed during the hospital encounter of 01/12/18 (from the past 48 hour(s))  Glucose, capillary     Status: None   Collection Time: 02/05/18  6:22 AM  Result Value Ref Range   Glucose-Capillary 89 70 - 99 mg/dL    Blood Alcohol level:  Lab Results  Component Value Date   ETH <10 16/04/9603    Metabolic Disorder Labs: No results found for: HGBA1C, MPG No results found for: PROLACTIN No results found for: CHOL, TRIG, HDL, CHOLHDL, VLDL, LDLCALC  Physical Findings: AIMS: Facial and Oral Movements Muscles of Facial Expression: None, normal Lips and Perioral Area: None, normal Jaw: None, normal Tongue: None, normal,Extremity Movements Upper (arms, wrists, hands, fingers): None, normal Lower (legs, knees, ankles, toes): None, normal, Trunk Movements Neck, shoulders, hips: None, normal, Overall Severity Severity of abnormal movements (highest score from questions above): None, normal Incapacitation due to abnormal movements: None, normal Patient's awareness of abnormal movements (rate only patient's report): No Awareness, Dental Status Current problems with teeth and/or dentures?: No Does patient usually wear dentures?: No  CIWA:    COWS:     Musculoskeletal: Strength & Muscle Tone: within normal limits Gait & Station: normal Patient leans: N/A  Psychiatric Specialty Exam: Physical Exam  ROS  Blood pressure (!) 90/53, pulse 64, temperature 98.6 F (37 C), resp. rate 18,  height 5\' 10"  (1.778 m), weight 162 lb (73.5 kg), SpO2 98 %.Body mass index is 23.24 kg/m.  General Appearance: Casual  Eye Contact:  Fair  Speech:  Slow  Volume:  Decreased  Mood:  Euthymic  Affect:  Constricted  Thought Process:  Goal Directed  Orientation:  Full (Time, Place, and Person)  Thought Content:  Logical  Suicidal Thoughts:  No  Homicidal Thoughts:  No  Memory:  Immediate;   Fair Recent;   Fair Remote;  Fair  Judgement:  Fair  Insight:  Fair  Psychomotor Activity:  Decreased  Concentration:  Concentration: Fair  Recall:  [employer] of Knowledge:  Fair  Language:  Fair  Akathisia:  No  Handed:  Right  AIMS (if indicated):     Assets:  Desire for Improvement Resilience  ADL's:  Intact  Cognition:  Impaired,  Mild  Sleep:  Number of Hours: 7     Treatment Plan Summary: Daily contact with patient to assess and evaluate symptoms and progress in treatment, Medication management and Plan Patient appears to be approaching a plateau clinically.  We will do ECT tomorrow after which I anticipate stopping the index course.  I would like to be able to discharge him by the end of the week.  Patient is in favor of it.  Spoke with social work today.  There seems to be some hang up with the family about whether they will have an appropriate place for him to live.  We are going to try and get in touch with the wife as well to see if she can be of some assistance with that.  No change to medicine for now.  Continue current treatment plan.  Alethia Berthold, MD 02/06/2018, 7:59 PM

## 2018-02-06 NOTE — Plan of Care (Signed)
Patient is alert and oriented. Patient complains of depression, denied SI, HI and AVH today. Patient experiencing some mild confusion. Patient rates pain 6/10  back pain, rates depression today 6/10 and anxiety 6/10. Patient did not want to go to any of the groups today, isolative today, and only coming out for meals today. Patient received Ibuprofen for back pain. Patient seems to have a flat affect today. Nurse will continue to monitor. Problem: Activity: Goal: Interest or engagement in activities will improve Outcome: Progressing Goal: Sleeping patterns will improve Outcome: Progressing   Problem: Education: Goal: Utilization of techniques to improve thought processes will improve Outcome: Progressing   Problem: Activity: Goal: Interest or engagement in leisure activities will improve Outcome: Progressing   Problem: Coping: Goal: Will verbalize feelings Outcome: Progressing   Problem: Safety: Goal: Ability to disclose and discuss suicidal ideas will improve Outcome: Progressing

## 2018-02-06 NOTE — Plan of Care (Addendum)
Patient found in room standing up upon my arrival. Patient is visible but not social this evening. Patient remains depressed. Affect is blunted, less animated. Reports that ECT "takes a lot [place] me." Denies SI/HI/AVH. Patient is somewhat disorganized. Patient was standing up, holding eye glasses to his face (closed), and attempting to read something on the window sill in the dark. Answers most assessment questions with "I'm okay." Reports appetite and voiding adequately. Denies pain. Hygiene is deteriorating. Patient has been wearing and sleeping in same clothes for at least the last 3 days. When offered hygiene products and scrubs, patient politely declined. Patient is polite and pleasant. Compliant with HS medications and staff direction. Given Trazodone for sleep with positive results. Q 15 minute checks maintained. Will continue to monitor throughout the shift. Patient slept 7 hours. No apparent distress. Will endorse care to oncoming shift.  Problem: Activity: Goal: Interest or engagement in activities will improve Outcome: Progressing Goal: Sleeping patterns will improve Outcome: Progressing   Problem: Education: Goal: Utilization of techniques to improve thought processes will improve Outcome: Progressing   Problem: Education: Goal: Knowledge of General Education information will improve Outcome: Progressing

## 2018-02-06 NOTE — Progress Notes (Signed)
Recreation Therapy Notes  Date: 02/06/2018  Time: 1:00pm   Location: Craft Room   Behavioral response: N/A   Intervention Topic: Problem Solving  Discussion/Intervention: Patient did not attend group.   Clinical Observations/Feedback:  Patient did not attend group.   [name] LRT/CTRS        [name] 02/06/2018 2:43 PM

## 2018-02-07 ENCOUNTER — Inpatient Hospital Stay: Payer: BLUE CROSS/BLUE SHIELD | Admitting: Registered Nurse

## 2018-02-07 LAB — GLUCOSE, CAPILLARY: Glucose-Capillary: 92 mg/dL (ref 70–99)

## 2018-02-07 MED ORDER — KETAMINE HCL 10 MG/ML IJ SOLN
INTRAMUSCULAR | Status: DC | PRN
  Administered 2018-02-07: 100 mg via INTRAVENOUS

## 2018-02-07 MED ORDER — SUCCINYLCHOLINE CHLORIDE 20 MG/ML IJ SOLN
INTRAMUSCULAR | Status: DC | PRN
  Administered 2018-02-07: 80 mg via INTRAVENOUS

## 2018-02-07 MED ORDER — KETAMINE HCL 50 MG/ML IJ SOLN
INTRAMUSCULAR | Status: AC
  Filled 2018-02-07: qty 10

## 2018-02-07 MED ORDER — SODIUM CHLORIDE 0.9 % IV SOLN
500.0000 mL | Freq: Once | INTRAVENOUS | Status: AC
  Administered 2018-02-07: 500 mL via INTRAVENOUS

## 2018-02-07 MED ORDER — SODIUM CHLORIDE 0.9 % IV SOLN
INTRAVENOUS | Status: DC | PRN
  Administered 2018-02-07: 10:00:00 via INTRAVENOUS

## 2018-02-07 NOTE — Anesthesia Post-op Follow-up Note (Signed)
Anesthesia QCDR form completed.        

## 2018-02-07 NOTE — Anesthesia Procedure Notes (Signed)
Performed by: Sheana Bir, CRNA Pre-anesthesia Checklist: Patient identified, Emergency Drugs available, Suction available and Patient being monitored Patient Re-evaluated:Patient Re-evaluated prior to induction Oxygen Delivery Method: Circle system utilized Preoxygenation: Pre-oxygenation with 100% oxygen Induction Type: IV induction Ventilation: Mask ventilation without difficulty and Mask ventilation throughout procedure Airway Equipment and Method: Bite block Placement Confirmation: positive ETCO2 Dental Injury: Teeth and Oropharynx as per pre-operative assessment        

## 2018-02-07 NOTE — Procedures (Signed)
ECT SERVICES Physician's Interval Evaluation & Treatment Note  Patient Identification: [name] MRN:  [id number] Date of Evaluation:  02/07/2018 TX #: 11  MADRS:   MMSE:   P.E. Findings:  No change to physical exam.  Strength has improved dramatically  Psychiatric Interval Note:  Much brighter  Subjective:  Patient is a 62 y.o. male seen for evaluation for Electroconvulsive Therapy. Feeling better  Treatment Summary:   []   Right Unilateral             [x]  Bilateral   % Energy : 1.0 ms 100%   Impedance: 1470 ohms  Seizure Energy Index: 2480 V squared  Postictal Suppression Index: 74%  Seizure Concordance Index: 99%  Medications  Pre Shock: Ketamine 80 mg succinylcholine 80 mg  Post Shock:    Seizure Duration: 24 seconds EMG 42 seconds EEG   Comments: This will be the last of his index course treatment  Lungs:  [x]   Clear to auscultation               []  Other:   Heart:    [x]   Regular rhythm             []  irregular rhythm    [x]   Previous H&P reviewed, patient examined and there are NO CHANGES                 []   Previous H&P reviewed, patient examined and there are changes noted.   Alethia Berthold, MD 7/31/20199:59 AM

## 2018-02-07 NOTE — Transfer of Care (Signed)
Immediate Anesthesia Transfer of Care Note  Patient: [name]  Procedure(s) Performed: ECT TX  Patient Location: PACU  Anesthesia Type:General  Level of Consciousness: sedated  Airway & Oxygen Therapy: Patient Spontanous Breathing and Patient connected to face mask oxygen  Post-op Assessment: Report given to RN and Post -op Vital signs reviewed and stable  Post vital signs: Reviewed and stable  Last Vitals:  Vitals Value Taken Time  BP 167/87 02/07/2018 10:13 AM  Temp 36.8 C 02/07/2018 10:13 AM  Pulse 61 02/07/2018 10:14 AM  Resp 14 02/07/2018 10:14 AM  SpO2 100 % 02/07/2018 10:14 AM  Vitals shown include unvalidated device data.  Last Pain:  Vitals:   02/07/18 0908  TempSrc: Oral  PainSc: 0-No pain      Patients Stated Pain Goal: 0 (96/75/91 6384)  Complications: No apparent anesthesia complications

## 2018-02-07 NOTE — H&P (Signed)
[name] is an 62 y.o. male.   Chief Complaint: feeling better  HPI: recent major depression  Past Medical History:  Diagnosis Date  . Depression     Past Surgical History:  Procedure Laterality Date  . HERNIA REPAIR      History reviewed. No pertinent family history. Social History:  reports that he has quit smoking. He quit smokeless tobacco use about 12 months ago. He reports that he has current or past drug history. His alcohol history is not on file.  Allergies: No Known Allergies  Medications Prior to Admission  Medication Sig Dispense Refill  . feeding supplement, ENSURE ENLIVE, (ENSURE ENLIVE) LIQD Take 237 mLs by mouth 2 (two) times daily between meals. 237 mL 12  . ibuprofen (ADVIL,MOTRIN) 600 MG tablet Take 1 tablet (600 mg total) by mouth every 6 (six) hours as needed for moderate pain. 30 tablet 0  . Multiple Vitamin (MULTIVITAMIN WITH MINERALS) TABS tablet Take 1 tablet by mouth daily. 30 tablet 0  . predniSONE (STERAPRED UNI-PAK 21 TAB) 10 MG (21) TBPK tablet Taper by 10 mg daily 21 tablet 0  . vitamin B-12 1000 MCG tablet Take 1 tablet (1,000 mcg total) by mouth daily. 30 tablet 0  . Vitamin D, Ergocalciferol, (DRISDOL) 50000 units CAPS capsule Take 1 capsule (50,000 Units total) by mouth every 7 (seven) days. 5 capsule 0    Results for orders placed or performed during the hospital encounter of 01/12/18 (from the past 48 hour(s))  Glucose, capillary     Status: None   Collection Time: 02/07/18  7:02 AM  Result Value Ref Range   Glucose-Capillary 92 70 - 99 mg/dL   No results found.  Review of Systems  Constitutional: Negative.   HENT: Negative.   Eyes: Negative.   Respiratory: Negative.   Cardiovascular: Negative.   Gastrointestinal: Negative.   Musculoskeletal: Negative.   Skin: Negative.   Neurological: Negative.   Psychiatric/Behavioral: Negative.     Blood pressure 116/64, pulse 61, temperature 98.5 F (36.9 C), temperature source Oral,  resp. rate 16, height 5\' 10"  (1.778 m), weight 73.5 kg (162 lb), SpO2 99 %. Physical Exam  Nursing note and vitals reviewed. Constitutional: He appears well-developed and well-nourished.  HENT:  Head: Normocephalic and atraumatic.  Eyes: Pupils are equal, round, and reactive to light. Conjunctivae are normal.  Neck: Normal range of motion.  Cardiovascular: Regular rhythm and normal heart sounds.  Respiratory: Effort normal. No respiratory distress.  GI: Soft.  Musculoskeletal: Normal range of motion.  Neurological: He is alert.  Skin: Skin is warm and dry.  Psychiatric: He has a normal mood and affect. His behavior is normal. Judgment and thought content normal.     Assessment/Plan Last ECT today for index course  Alethia Berthold, MD 02/07/2018, 9:03 AM

## 2018-02-07 NOTE — Plan of Care (Signed)
Patient is alert oriented and pleasant. Patient visible in the dayroom; patient showered and cooperative with medications. Patient completed ECT treatment today. Patient was disoriented about the time, but was easily reoriented. Patient did not complain of any pain, and able to answer post ECT questions. Vitals signs stable and within normal range; blood pressure 113/67, pulse 72, oral temp 98.2. Nurse will continue to monitor.

## 2018-02-07 NOTE — Anesthesia Postprocedure Evaluation (Signed)
Anesthesia Post Note  Patient: [name]  Procedure(s) Performed: ECT TX  Patient location during evaluation: PACU Anesthesia Type: General Level of consciousness: awake and alert Pain management: pain level controlled Vital Signs Assessment: post-procedure vital signs reviewed and stable Respiratory status: spontaneous breathing, nonlabored ventilation and respiratory function stable Cardiovascular status: blood pressure returned to baseline and stable Postop Assessment: no apparent nausea or vomiting Anesthetic complications: no     Last Vitals:  Vitals:   02/07/18 1025 02/07/18 1037  BP: (!) 141/104 (!) 154/97  Pulse: 78 79  Resp: 18 11  Temp:    SpO2: 96% 97%    Last Pain:  Vitals:   02/07/18 1025  TempSrc:   PainSc: Asleep                 Alphonsus Sias

## 2018-02-07 NOTE — Tx Team (Signed)
Interdisciplinary Treatment and Diagnostic Plan Update  01/29/2018 Time of Session: 8:40 AM [name] MRN: [id number]  Principal Diagnosis: Severe recurrent major depression without psychotic features [facility])  Secondary Diagnoses: Principal Problem:   Severe recurrent major depression without psychotic features ([place]) Active Problems:   Catatonia   Current Medications:  Current Facility-Administered Medications  Medication Dose Route Frequency Provider Last Rate Last Dose  . acetaminophen (TYLENOL) tablet 650 mg  650 mg Oral Q6H PRN Clapacs, Madie Reno, MD   650 mg at 02/02/18 2140  . alum & mag hydroxide-simeth (MAALOX/MYLANTA) 200-200-20 MG/5ML suspension 30 mL  30 mL Oral Q4H PRN Clapacs, John T, MD      . feeding supplement (ENSURE ENLIVE) (ENSURE ENLIVE) liquid 237 mL  237 mL Oral BID BM Clapacs, John T, MD   237 mL at 02/06/18 1500  . Gerhardt's butt cream   Topical BID Clapacs, John T, MD      . ibuprofen (ADVIL,MOTRIN) tablet 400 mg  400 mg Oral Q6H PRN Clapacs, Madie Reno, MD   400 mg at 02/06/18 1459  . magnesium hydroxide (MILK OF MAGNESIA) suspension 30 mL  30 mL Oral Daily PRN Clapacs, Madie Reno, MD   30 mL at 01/16/18 0852  . mirtazapine (REMERON) tablet 45 mg  45 mg Oral QHS Clapacs, Madie Reno, MD   45 mg at 02/06/18 2106  . multivitamin with minerals tablet 1 tablet  1 tablet Oral Daily Clapacs, Madie Reno, MD   1 tablet at 02/07/18 1415  . ondansetron (ZOFRAN) injection 4 mg  4 mg Intravenous Once PRN Alvin Critchley, MD      . ondansetron [facility]) injection 4 mg  4 mg Intravenous Once PRN Gunnar Fusi, MD      . pantoprazole (PROTONIX) EC tablet 40 mg  40 mg Oral Daily Clapacs, Madie Reno, MD   40 mg at 02/07/18 1415  . promethazine (PHENERGAN) injection 6.25-12.5 mg  6.25-12.5 mg Intravenous Q15 min PRN Arlyss Repress T, MD      . traZODone (DESYREL) tablet 100 mg  100 mg Oral QHS PRN Clapacs, Madie Reno, MD   100 mg at 02/05/18 2054   PTA Medications: Medications Prior to Admission   Medication Sig Dispense Refill Last Dose  . feeding supplement, ENSURE ENLIVE, (ENSURE ENLIVE) LIQD Take 237 mLs by mouth 2 (two) times daily between meals. 237 mL 12 02/06/2018 at Unknown time  . ibuprofen (ADVIL,MOTRIN) 600 MG tablet Take 1 tablet (600 mg total) by mouth every 6 (six) hours as needed for moderate pain. 30 tablet 0 02/06/2018 at Unknown time  . Multiple Vitamin (MULTIVITAMIN WITH MINERALS) TABS tablet Take 1 tablet by mouth daily. 30 tablet 0 02/06/2018 at Unknown time  . predniSONE (STERAPRED UNI-PAK 21 TAB) 10 MG (21) TBPK tablet Taper by 10 mg daily 21 tablet 0 02/06/2018 at Unknown time  . vitamin B-12 1000 MCG tablet Take 1 tablet (1,000 mcg total) by mouth daily. 30 tablet 0 02/06/2018 at Unknown time  . Vitamin D, Ergocalciferol, (DRISDOL) 50000 units CAPS capsule Take 1 capsule (50,000 Units total) by mouth every 7 (seven) days. 5 capsule 0 02/06/2018 at Unknown time    Patient Stressors:    Patient Strengths:    Treatment Modalities: Medication Management, Group therapy, Case management,  1 to 1 session with clinician, Psychoeducation, Recreational therapy.   Physician Treatment Plan for Primary Diagnosis: Severe recurrent major depression without psychotic features ([place]) Long Term Goal(s): Improvement in symptoms so as ready for  discharge Improvement in symptoms so as ready for discharge   Short Term Goals: Ability to demonstrate self-control will improve Ability to identify and develop effective coping behaviors will improve Ability to maintain clinical measurements within normal limits will improve Compliance with prescribed medications will improve  Medication Management: Evaluate patient's response, side effects, and tolerance of medication regimen.  Therapeutic Interventions: 1 to 1 sessions, Unit Group sessions and Medication administration.  Evaluation of Outcomes: Progressing  Physician Treatment Plan for Secondary Diagnosis: Principal Problem:    Severe recurrent major depression without psychotic features ([place]) Active Problems:   Catatonia  Long Term Goal(s): Improvement in symptoms so as ready for discharge Improvement in symptoms so as ready for discharge   Short Term Goals: Ability to demonstrate self-control will improve Ability to identify and develop effective coping behaviors will improve Ability to maintain clinical measurements within normal limits will improve Compliance with prescribed medications will improve     Medication Management: Evaluate patient's response, side effects, and tolerance of medication regimen.  Therapeutic Interventions: 1 to 1 sessions, Unit Group sessions and Medication administration.  Evaluation of Outcomes: Progressing   RN Treatment Plan for Primary Diagnosis: Severe recurrent major depression without psychotic features ([place]) Long Term Goal(s): Knowledge of disease and therapeutic regimen to maintain health will improve  Short Term Goals: Ability to identify and develop effective coping behaviors will improve and Compliance with prescribed medications will improve  Medication Management: RN will administer medications as ordered by provider, will assess and evaluate patient's response and provide education to patient for prescribed medication. RN will report any adverse and/or side effects to prescribing provider.  Therapeutic Interventions: 1 on 1 counseling sessions, Psychoeducation, Medication administration, Evaluate responses to treatment, Monitor vital signs and CBGs as ordered, Perform/monitor CIWA, COWS, AIMS and Fall Risk screenings as ordered, Perform wound care treatments as ordered.  Evaluation of Outcomes: Progressing   LCSW Treatment Plan for Primary Diagnosis: Severe recurrent major depression without psychotic features ([place]) Long Term Goal(s): Safe transition to appropriate next level of care at discharge, Engage patient in therapeutic group addressing interpersonal  concerns.  Short Term Goals: Engage patient in aftercare planning with referrals and resources, Identify triggers associated with mental health/substance abuse issues and Increase skills for wellness and recovery  Therapeutic Interventions: Assess for all discharge needs, 1 to 1 time with Social worker, Explore available resources and support systems, Assess for adequacy in community support network, Educate family and significant other(s) on suicide prevention, Complete Psychosocial Assessment, Interpersonal group therapy.  Evaluation of Outcomes: Progressing   Progress in Treatment: Attending groups: No. Participating in groups: No. Taking medication as prescribed: Yes. Toleration medication: Yes. Family/Significant other contact made: Yes, individual(s) contacted:  grandson [place] Patient understands diagnosis: Yes. Discussing patient identified problems/goals with staff: Yes. Medical problems stabilized or resolved: Yes. Denies suicidal/homicidal ideation: Yes. Issues/concerns per patient self-inventory: No. Other:    New problem(s) identified: No, Describe:     New Short Term/Long Term Goal(s):  Patient Goals:  To feel better, have more energy to take car of himself more, be more active again  Discharge Plan or Barriers: Discharge home with grandson and follow up at [facility]  Reason for Continuation of Hospitalization: Depression, Continued ECT, Medication management, Coordination of Aftercare.  Estimated Length of Stay: 2-3 days  Recreational Therapy: Patient Stressors: N/A Patient Goal: Patient will engage in groups without prompting or encouragement from LRT x3 group sessions within 5 recreation therapy group sessions  Attendees: Patient: 02/07/2018 3:26 PM  Physician: Dr. Alethia Berthold, MD 02/07/2018 3:26 PM  Nursing: Polly Cobia, RN 02/07/2018 3:26 PM  RN Care Manager: 02/07/2018 3:26 PM  Social Worker: Dossie Arbour, LCSW 02/07/2018 3:26 PM  Recreational Therapist: 02/07/2018  3:26 PM  Other: Darin Engels, [place] 02/07/2018 3:26 PM  Other:  02/07/2018 3:26 PM  Other: 02/07/2018 3:26 PM    Scribe for Treatment Team: August Saucer, LCSW 02/07/2018 3:26 PM

## 2018-02-07 NOTE — Plan of Care (Signed)
Patient is resting in room calm and stable, no issues and no complain, patient is compliant with his medicines , contract for safety of self and others, 15 minute rounding is maintained, patient denies SI/HI and no signs of AVH at this time .   Problem: Activity: Goal: Interest or engagement in activities will improve Outcome: Progressing Goal: Sleeping patterns will improve Outcome: Progressing   Problem: Education: Goal: Utilization of techniques to improve thought processes will improve Outcome: Progressing   Problem: Activity: Goal: Interest or engagement in leisure activities will improve Outcome: Progressing   Problem: Coping: Goal: Will verbalize feelings Outcome: Progressing   Problem: Safety: Goal: Ability to disclose and discuss suicidal ideas will improve Outcome: Progressing   Problem: Education: Goal: Knowledge of General Education information will improve Outcome: Progressing

## 2018-02-07 NOTE — Progress Notes (Signed)
Recreation Therapy Notes   Date: 02/07/2018  Time: 9:30 pm   Location: Craft Room   Behavioral response: N/A   Intervention Topic: Stress  Discussion/Intervention: Patient did not attend group.   Clinical Observations/Feedback:  Patient did not attend group.   Kilee Hedding LRT/CTRS        Brandyce Dimario 02/07/2018 12:10 PM

## 2018-02-07 NOTE — Anesthesia Preprocedure Evaluation (Signed)
Anesthesia Evaluation  Patient identified by MRN, date of birth, ID band Patient awake    Reviewed: Allergy & Precautions, H&P , NPO status , reviewed documented beta blocker date and time   Airway Mallampati: II  TM Distance: >3 FB Neck ROM: full    Dental  (+) Chipped   Pulmonary former smoker,    Pulmonary exam normal        Cardiovascular Normal cardiovascular exam     Neuro/Psych    GI/Hepatic   Endo/Other    Renal/GU      Musculoskeletal   Abdominal   Peds  Hematology   Anesthesia Other Findings Past Medical History: No date: Depression  Past Surgical History: No date: HERNIA REPAIR  BMI    Body Mass Index:  23.24 kg/m      Reproductive/Obstetrics                             Anesthesia Physical Anesthesia Plan  ASA: III  Anesthesia Plan: General   Post-op Pain Management:    Induction:   PONV Risk Score and Plan: Treatment may vary due to age or medical condition and TIVA  Airway Management Planned:   Additional Equipment:   Intra-op Plan:   Post-operative Plan:   Informed Consent: I have reviewed the patients History and Physical, chart, labs and discussed the procedure including the risks, benefits and alternatives for the proposed anesthesia with the patient or authorized representative who has indicated his/her understanding and acceptance.   Dental Advisory Given  Plan Discussed with: CRNA  Anesthesia Plan Comments:         Anesthesia Quick Evaluation

## 2018-02-07 NOTE — Progress Notes (Signed)
[facility] MD Progress Note  02/07/2018 6:21 PM [name]  MRN:  [id number] Subjective: Follow-up for this patient with severe depression.  Had bilateral ECT again today which was done without any complication.  Patient's mood is consistently improved.  Not reporting any psychosis.  No suicidal ideation.  Eating better.  Strength is improved.  Still notably slow. Principal Problem: Severe recurrent major depression without psychotic features ([place]) Diagnosis:   Patient Active Problem List   Diagnosis Date Noted  . Catatonia [F06.1] 01/12/2018  . Malnutrition of moderate degree [E44.0] 01/09/2018  . Pressure injury of skin [L89.90] 01/09/2018  . Severe recurrent major depression without psychotic features ([place]) [F33.2] 01/09/2018  . Back pain [M54.9] 01/08/2018  . Shuffling gait [R26.89] 01/08/2018  . Depression [F32.9] 01/08/2018   Total Time spent with patient: 20 minutes  Past Psychiatric History: History of long-standing depression see previous note  Past Medical History:  Past Medical History:  Diagnosis Date  . Depression     Past Surgical History:  Procedure Laterality Date  . HERNIA REPAIR     Family History: History reviewed. No pertinent family history. Family Psychiatric  History: See previous note Social History:  Social History   Substance and Sexual Activity  Alcohol Use Not on file     Social History   Substance and Sexual Activity  Drug Use Not Currently    Social History   Socioeconomic History  . Marital status: Married    Spouse name: Not on file  . Number of children: Not on file  . Years of education: Not on file  . Highest education level: Not on file  Occupational History  . Not on file  Social Needs  . Financial resource strain: Not on file  . Food insecurity:    Worry: Not on file    Inability: Not on file  . Transportation needs:    Medical: Not on file    Non-medical: Not on file  Tobacco Use  . Smoking status: Former Research scientist (life sciences)  .  Smokeless tobacco: Former Systems developer    Quit date: 01/10/2017  Substance and Sexual Activity  . Alcohol use: Not on file  . Drug use: Not Currently  . Sexual activity: Not Currently  Lifestyle  . Physical activity:    Days per week: Not on file    Minutes per session: Not on file  . Stress: Not on file  Relationships  . Social connections:    Talks on phone: Not on file    Gets together: Not on file    Attends religious service: Not on file    Active member of club or organization: Not on file    Attends meetings of clubs or organizations: Not on file    Relationship status: Not on file  Other Topics Concern  . Not on file  Social History Narrative  . Not on file   Additional Social History:                         Sleep: Good  Appetite:  Fair  Current Medications: Current Facility-Administered Medications  Medication Dose Route Frequency Provider Last Rate Last Dose  . acetaminophen (TYLENOL) tablet 650 mg  650 mg Oral Q6H PRN Clapacs, Madie Reno, MD   650 mg at 02/02/18 2140  . alum & mag hydroxide-simeth (MAALOX/MYLANTA) 200-200-20 MG/5ML suspension 30 mL  30 mL Oral Q4H PRN Clapacs, Madie Reno, MD      . feeding supplement (ENSURE  ENLIVE) (ENSURE ENLIVE) liquid 237 mL  237 mL Oral BID BM Clapacs, John T, MD   237 mL at 02/06/18 1500  . Gerhardt's butt cream   Topical BID Clapacs, John T, MD      . ibuprofen (ADVIL,MOTRIN) tablet 400 mg  400 mg Oral Q6H PRN Clapacs, Madie Reno, MD   400 mg at 02/06/18 1459  . magnesium hydroxide (MILK OF MAGNESIA) suspension 30 mL  30 mL Oral Daily PRN Clapacs, Madie Reno, MD   30 mL at 01/16/18 0852  . mirtazapine (REMERON) tablet 45 mg  45 mg Oral QHS Clapacs, Madie Reno, MD   45 mg at 02/06/18 2106  . multivitamin with minerals tablet 1 tablet  1 tablet Oral Daily Clapacs, Madie Reno, MD   1 tablet at 02/07/18 1415  . ondansetron (ZOFRAN) injection 4 mg  4 mg Intravenous Once PRN Alvin Critchley, MD      . ondansetron [facility]) injection 4 mg  4 mg  Intravenous Once PRN Gunnar Fusi, MD      . pantoprazole (PROTONIX) EC tablet 40 mg  40 mg Oral Daily Clapacs, Madie Reno, MD   40 mg at 02/07/18 1415  . promethazine (PHENERGAN) injection 6.25-12.5 mg  6.25-12.5 mg Intravenous Q15 min PRN Arlyss Repress T, MD      . traZODone (DESYREL) tablet 100 mg  100 mg Oral QHS PRN Clapacs, Madie Reno, MD   100 mg at 02/05/18 2054    Lab Results:  Results for orders placed or performed during the hospital encounter of 01/12/18 (from the past 48 hour(s))  Glucose, capillary     Status: None   Collection Time: 02/07/18  7:02 AM  Result Value Ref Range   Glucose-Capillary 92 70 - 99 mg/dL    Blood Alcohol level:  Lab Results  Component Value Date   ETH <10 16/04/9603    Metabolic Disorder Labs: No results found for: HGBA1C, MPG No results found for: PROLACTIN No results found for: CHOL, TRIG, HDL, CHOLHDL, VLDL, LDLCALC  Physical Findings: AIMS: Facial and Oral Movements Muscles of Facial Expression: None, normal Lips and Perioral Area: None, normal Jaw: None, normal Tongue: None, normal,Extremity Movements Upper (arms, wrists, hands, fingers): None, normal Lower (legs, knees, ankles, toes): None, normal, Trunk Movements Neck, shoulders, hips: None, normal, Overall Severity Severity of abnormal movements (highest score from questions above): None, normal Incapacitation due to abnormal movements: None, normal Patient's awareness of abnormal movements (rate only patient's report): No Awareness, Dental Status Current problems with teeth and/or dentures?: No Does patient usually wear dentures?: No  CIWA:    COWS:     Musculoskeletal: Strength & Muscle Tone: decreased Gait & Station: unsteady Patient leans: N/A  Psychiatric Specialty Exam: Physical Exam  Nursing note and vitals reviewed. Constitutional: He appears well-developed and well-nourished.  HENT:  Head: Normocephalic and atraumatic.  Eyes: Pupils are equal, round, and  reactive to light. Conjunctivae are normal.  Neck: Normal range of motion.  Cardiovascular: Regular rhythm and normal heart sounds.  Respiratory: Effort normal. No respiratory distress.  GI: Soft.  Musculoskeletal: Normal range of motion.  Neurological: He is alert.  Skin: Skin is warm and dry.  Psychiatric: Judgment normal. His affect is blunt. His speech is delayed. He is slowed. Thought content is not paranoid. Cognition and memory are normal. He does not exhibit a depressed mood. He expresses no homicidal and no suicidal ideation.    Review of Systems  Constitutional: Negative.   HENT: Negative.  Eyes: Negative.   Respiratory: Negative.   Cardiovascular: Negative.   Gastrointestinal: Negative.   Musculoskeletal: Negative.   Skin: Negative.   Neurological: Negative.   Psychiatric/Behavioral: Negative.     Blood pressure 113/69, pulse 72, temperature 98.2 F (36.8 C), temperature source Oral, resp. rate 16, height 5\' 10"  (1.778 m), weight 73.5 kg (162 lb), SpO2 97 %.Body mass index is 23.24 kg/m.  General Appearance: Fairly Groomed  Eye Contact:  Good  Speech:  Clear and Coherent  Volume:  Decreased  Mood:  Euthymic  Affect:  Congruent  Thought Process:  Goal Directed  Orientation:  Full (Time, Place, and Person)  Thought Content:  Logical  Suicidal Thoughts:  No  Homicidal Thoughts:  No  Memory:  Immediate;   Fair Recent;   Fair Remote;   Fair  Judgement:  Fair  Insight:  Fair  Psychomotor Activity:  Decreased  Concentration:  Concentration: Fair  Recall:  [employer] of Knowledge:  Fair  Language:  Fair  Akathisia:  No  Handed:  Right  AIMS (if indicated):     Assets:  Desire for Improvement  ADL's:  Intact  Cognition:  Impaired,  Mild  Sleep:  Number of Hours: 7.45     Treatment Plan Summary: Daily contact with patient to assess and evaluate symptoms and progress in treatment, Medication management and Plan Patient has shown significant improvement.  We  are going to stop the index course of ECT at this point as he appears to have plateaued.  This will give him time to recover a little bit.  Spoke with the patient and reminded him the treatment team is aiming for discharge by the end of the week if possible.  No change to medicine.  Alethia Berthold, MD 02/07/2018, 6:21 PM

## 2018-02-08 MED ORDER — TRAZODONE HCL 100 MG PO TABS: 100.0000 mg | ORAL_TABLET | Freq: Every evening | ORAL | 2 refills | Status: DC | PRN

## 2018-02-08 MED ORDER — MIRTAZAPINE 45 MG PO TABS: 45.0000 mg | ORAL_TABLET | Freq: Every day | ORAL | 2 refills | Status: DC

## 2018-02-08 NOTE — Progress Notes (Signed)
Recreation Therapy Notes  Date: 02/08/2018  Time: 9:30 am  Location: Craft Room  Behavioral response: Appropriate    Intervention Topic: Self-care  Discussion/Intervention:  Group content today was focused on Self-Care. The group defined self-care and some positive ways they care for themselves. Individuals expressed ways and reasons why they neglected any self-care in the past. Patients described ways to improve self-care in the future. The group explained what could happen if they did not do any self-care activities at all. The group participated in the intervention "self-care assessment" where they had a chance to discover some of their weaknesses and strengths in self- care. Patient came up with a self-care plan to improve themselves in the future.  Clinical Observations/Feedback:  Patient came to group and late due to unknown reasons. Individual was social with peers and staff while participating in the intervention. [place] Surgeon LRT/CTRS         [name] 02/08/2018 11:49 AM

## 2018-02-08 NOTE — Plan of Care (Signed)
Patient calm tolerating ECT treatment well without any noticeable side effects, patient remained in his room and compliant with his meds, contract for safety and voice no concerns, sleep long hours , denies SI/HI and no signs of AVH noted, 15 minute rounding is in progress.   Problem: Activity: Goal: Interest or engagement in activities will improve Outcome: Progressing Goal: Sleeping patterns will improve Outcome: Progressing   Problem: Education: Goal: Utilization of techniques to improve thought processes will improve Outcome: Progressing   Problem: Activity: Goal: Interest or engagement in leisure activities will improve Outcome: Progressing   Problem: Coping: Goal: Will verbalize feelings Outcome: Progressing   Problem: Safety: Goal: Ability to disclose and discuss suicidal ideas will improve Outcome: Progressing   Problem: Education: Goal: Knowledge of General Education information will improve Outcome: Progressing

## 2018-02-08 NOTE — Progress Notes (Signed)
   02/08/18 0915  Clinical Encounter Type  Visited With Patient  Visit Type Follow-up;Spiritual support;Behavioral Health  Referral From Nurse  Consult/Referral To Chaplain  Spiritual Encounters  Spiritual Needs Prayer;Emotional   [facility] met with [name] while he was attempting to make a phone call. The patient appeared to be pleased to see me and we stood and spoke for 10 -15 minutes about the patient's possible release. He seems both excited (this is based upon his normal base line for excitement). [name] also appeared concerned as he stated that being in the [place] was not so bad. It made me think that maybe he has built some stability with [facility]. While the patient and I were talking, the 0930 group started and I walked with the patient to the group in the craft room. I wasn't sure if he followed me to the craft room or I followed him. This is do to the fact that he appeared somewhat confused at to why he was in the group. He was very compliant but also did not perform any task that were asked of the group.   The patient was starring at the survey that was given to him, with no emotion. I observed [name] when he first came to [facility] on 12 January 2018, he has come along way from that first day. He is mobile and has a more normal speech pattern. I will continue to follow up with the patient until discharge.

## 2018-02-08 NOTE — BHH Suicide Risk Assessment (Signed)
[facility] Discharge Suicide Risk Assessment   Principal Problem: Severe recurrent major depression without psychotic features [facility]) Discharge Diagnoses:  Patient Active Problem List   Diagnosis Date Noted  . Catatonia [F06.1] 01/12/2018  . Malnutrition of moderate degree [E44.0] 01/09/2018  . Pressure injury of skin [L89.90] 01/09/2018  . Severe recurrent major depression without psychotic features ([place]) [F33.2] 01/09/2018  . Back pain [M54.9] 01/08/2018  . Shuffling gait [R26.89] 01/08/2018  . Depression [F32.9] 01/08/2018    Total Time spent with patient: 45 minutes  Musculoskeletal: Strength & Muscle Tone: within normal limits Gait & Station: normal Patient leans: N/A  Psychiatric Specialty Exam: ROS  Blood pressure 123/78, pulse 69, temperature 98.1 F (36.7 C), temperature source Oral, resp. rate 18, height 5\' 10"  (1.778 m), weight 73.5 kg (162 lb), SpO2 98 %.Body mass index is 23.24 kg/m.  General Appearance: Fairly Groomed  Engineer, water::  Fair  Speech:  [phone]  Volume:  Decreased  Mood:  Euthymic  Affect:  Constricted  Thought Process:  Coherent  Orientation:  Full (Time, Place, and Person)  Thought Content:  Logical  Suicidal Thoughts:  No  Homicidal Thoughts:  No  Memory:  Immediate;   Fair Recent;   Fair Remote;   Fair  Judgement:  Fair  Insight:  Fair  Psychomotor Activity:  Decreased  Concentration:  Fair  Recall:  [employer] of Knowledge:Fair  Language: Fair  Akathisia:  No  Handed:  Right  AIMS (if indicated):     Assets:  Desire for Improvement Housing Resilience Social Support  Sleep:  Number of Hours: 6.45  Cognition: Impaired,  Mild  ADL's:  Intact   Mental Status Per Nursing Assessment::   On Admission:  NA  Demographic Factors:  Male, Divorced or widowed, Caucasian and Unemployed  Loss Factors: Loss of significant relationship  Historical Factors: NA  Risk Reduction Factors:   Sense of responsibility to family, Religious beliefs  about death, Living with another person, especially a relative and Positive social support  Continued Clinical Symptoms:  Depression:   Anhedonia  Cognitive Features That Contribute To Risk:  Loss of executive function    Suicide Risk:  Minimal: No identifiable suicidal ideation.  Patients presenting with no risk factors but with morbid ruminations; may be classified as minimal risk based on the severity of the depressive symptoms  Follow-up Information    [place] Follow up.   Why:  Please contact your case manager with [facility] within 7 days of your discharge.  The number is [phone].  Thank you. Contact information: [address] [place]  [address] [phone]       [place] on 02/12/2018.   Why:  02/12/2018 at 12:30pm for Hospital Follow up appointment. Contact information: [place] [address] [phone]        [facility] Follow up on 02/23/2018.   Why:  You have a follow-up ECT session with Dr. Weber Cooks on this date.  Please arrive by 8:30AM to check-in.  Thank you. Contact information: [place] [id number] [address] [place] [phone]          Plan Of Care/Follow-up recommendations:  Activity:  as tolerated Diet:  regular Other:  ECT 8/16 and follow up on medication and outpatient treatment  Alethia Berthold, MD 02/08/2018, 12:33 PM

## 2018-02-08 NOTE — Progress Notes (Signed)
Recreation Therapy Notes  INPATIENT RECREATION TR PLAN  Patient Details Name: [name] MRN: [id number] DOB: [birth year] Today's Date: 02/08/2018  Rec Therapy Plan Is patient appropriate for Therapeutic Recreation?: Yes Treatment times per week: at least 3 Estimated Length of Stay: 5-7 days TR Treatment/Interventions: Group participation (Comment)  Discharge Criteria Pt will be discharged from therapy if:: Discharged Treatment plan/goals/alternatives discussed and agreed upon by:: Patient/family  Discharge Summary Short term goals set:  Patient will engage in groups without prompting or encouragement from LRT x3 group sessions within 5 recreation therapy group sessions Short term goals met: Not met Progress toward goals comments: Groups attended Which groups?: Anger management, Other (Comment)(Relaxation, Self-care) Reason goals not met: Patient spent most of his time in his room Therapeutic equipment acquired: N/A Reason patient discharged from therapy: Discharge from hospital Pt/family agrees with progress & goals achieved: Yes Date patient discharged from therapy: 02/08/18   Averleigh Savary 02/08/2018, 3:10 PM

## 2018-02-08 NOTE — Progress Notes (Signed)
  [facility] Adult Case Management Discharge Plan :  Will you be returning to the same living situation after discharge:  Yes,  Pt will be returning to his home. At discharge, do you have transportation home?: Yes,  Pt's grandson will be picking him up at discharge. Do you have the ability to pay for your medications: Yes,  Pt has medical insurance and financial means to pay for his medications.  Release of information consent forms completed and in the chart;  Patient's signature needed at discharge.  Patient to Follow up at: Follow-up Information    [facility].   Why:  Please contact your case manager with [facility] within 7 days of your discharge.  The number is [phone].  Thank you. Contact information: [place]  [address] [phone]       [place] on 02/12/2018.   Why:  02/12/2018 at 12:30pm for Hospital Follow up appointment. Contact information: [place] [address] [phone]        [facility] Follow up on 02/23/2018.   Why:  You have a follow-up ECT session with Dr. Weber Cooks on this date.  Please arrive by 8:30AM to check-in.  Thank you. Contact information: [place] [id number] ar [place] [phone]          Next level of care provider has access to [place] and Suicide Prevention discussed: Yes,  No safety issues noted.  Have you used any form of tobacco in the last 30 days? (Cigarettes, Smokeless Tobacco, Cigars, and/or Pipes): No  Has patient been referred to the Quitline?: N/A patient is not a smoker  Patient has been referred for addiction treatment: [place], LCSW 02/08/2018, 11:41 AM

## 2018-02-08 NOTE — BHH Group Notes (Signed)
[place] Group Notes:  (Nursing/MHT/Case Management/Adjunct)  Date:  02/08/2018  Time:  12:57 AM  Type of Therapy:  Group Therapy  Participation Level:  Did Not Attend   Nehemiah Settle 02/08/2018, 12:57 AM

## 2018-02-08 NOTE — Progress Notes (Signed)
Patient was provided with discharge summary, Transition packet, and Suicide Risk Assessment. Verbalized discharge summary, transition packet and suicide risk assessment, patient made aware of any change in medications, and all upcoming appointments.   Patient belongings/money returned. Rx provided to patient.  Patient denied any SI, denies plans for self harm or the harm of others. Patient is calm, with appropriate affect and eye contact. Patient reports no complaints at this time.   Patient will be discharged to family @ 1425

## 2018-02-11 NOTE — Discharge Summary (Signed)
Physician Discharge Summary Note  Patient:  [name] is an 62 y.o., male MRN:  [id number] DOB:  [birth year] Patient phone:  [phone] (home)  Patient address:   [place] [address],  Total Time spent with patient: 45 minutes  Date of Admission:  01/12/2018 Date of Discharge: February 08, 2018  Reason for Admission: Admitted transferred from the medical service because of severe depression and near catatonia  Principal Problem: Severe recurrent major depression without psychotic features [facility]) Discharge Diagnoses: Patient Active Problem List   Diagnosis Date Noted  . Catatonia [F06.1] 01/12/2018  . Malnutrition of moderate degree [E44.0] 01/09/2018  . Pressure injury of skin [L89.90] 01/09/2018  . Severe recurrent major depression without psychotic features ([place]) [F33.2] 01/09/2018  . Back pain [M54.9] 01/08/2018  . Shuffling gait [R26.89] 01/08/2018  . Depression [F32.9] 01/08/2018    Past Psychiatric History: History of long-standing depression without previous treatment  Past Medical History:  Past Medical History:  Diagnosis Date  . Depression     Past Surgical History:  Procedure Laterality Date  . HERNIA REPAIR     Family History: History reviewed. No pertinent family history. Family Psychiatric  History: None Social History:  Social History   Substance and Sexual Activity  Alcohol Use Not on file     Social History   Substance and Sexual Activity  Drug Use Not Currently    Social History   Socioeconomic History  . Marital status: Married    Spouse name: Not on file  . Number of children: Not on file  . Years of education: Not on file  . Highest education level: Not on file  Occupational History  . Not on file  Social Needs  . Financial resource strain: Not on file  . Food insecurity:    Worry: Not on file    Inability: Not on file  . Transportation needs:    Medical: Not on file    Non-medical: Not on file  Tobacco  Use  . Smoking status: Former Research scientist (life sciences)  . Smokeless tobacco: Former Systems developer    Quit date: 01/10/2017  Substance and Sexual Activity  . Alcohol use: Not on file  . Drug use: Not Currently  . Sexual activity: Not Currently  Lifestyle  . Physical activity:    Days per week: Not on file    Minutes per session: Not on file  . Stress: Not on file  Relationships  . Social connections:    Talks on phone: Not on file    Gets together: Not on file    Attends religious service: Not on file    Active member of club or organization: Not on file    Attends meetings of clubs or organizations: Not on file    Relationship status: Not on file  Other Topics Concern  . Not on file  Social History Narrative  . Not on file    Hospital Course: Patient was admitted with an intention to begin ECT.  Consent was obtained and the patient began ECT treatment.  Had multiple bilateral ECT treatments during his time in the hospital.  Showed improvement with dramatic improvement in his physical activity.  Participated with physical therapy and was able to become more active and show better self-care.  Subjective mood improved.  At no time displayed any suicidal or dangerous behavior.  Continues to be somewhat impaired with some slow thinking and fatigue.  Family however was agreeable to discharge plan.  Follow-up with local  psychiatric care but also with maintenance ECT with a plan to return in about 2 weeks.  Physical Findings: AIMS: Facial and Oral Movements Muscles of Facial Expression: None, normal Lips and Perioral Area: None, normal Jaw: None, normal Tongue: None, normal,Extremity Movements Upper (arms, wrists, hands, fingers): None, normal Lower (legs, knees, ankles, toes): None, normal, Trunk Movements Neck, shoulders, hips: None, normal, Overall Severity Severity of abnormal movements (highest score from questions above): None, normal Incapacitation due to abnormal movements: None, normal Patient's  awareness of abnormal movements (rate only patient's report): No Awareness, Dental Status Current problems with teeth and/or dentures?: No Does patient usually wear dentures?: No  CIWA:    COWS:     Musculoskeletal: Strength & Muscle Tone: within normal limits Gait & Station: normal Patient leans: N/A  Psychiatric Specialty Exam: Physical Exam  Nursing note and vitals reviewed. Constitutional: He appears well-developed and well-nourished.  HENT:  Head: Normocephalic and atraumatic.  Eyes: Pupils are equal, round, and reactive to light. Conjunctivae are normal.  Neck: Normal range of motion.  Cardiovascular: Normal heart sounds.  Respiratory: Effort normal.  GI: Soft.  Musculoskeletal: Normal range of motion.  Neurological: He is alert.  Skin: Skin is warm and dry.  Psychiatric: Judgment normal. His affect is blunt. His speech is delayed. He is not agitated. Thought content is not paranoid. He expresses no homicidal and no suicidal ideation. He exhibits abnormal recent memory.    Review of Systems  Constitutional: Negative.   HENT: Negative.   Eyes: Negative.   Respiratory: Negative.   Cardiovascular: Negative.   Gastrointestinal: Negative.   Musculoskeletal: Negative.   Skin: Negative.   Neurological: Negative.   Psychiatric/Behavioral: Negative.     Blood pressure 123/78, pulse 69, temperature 98.1 F (36.7 C), temperature source Oral, resp. rate 18, height 5\' 10"  (1.778 m), weight 73.5 kg (162 lb), SpO2 98 %.Body mass index is 23.24 kg/m.  General Appearance: Casual  Eye Contact:  Good  Speech:  Slow  Volume:  Decreased  Mood:  Euthymic  Affect:  Constricted  Thought Process:  Goal Directed  Orientation:  Full (Time, Place, and Person)  Thought Content:  Logical  Suicidal Thoughts:  No  Homicidal Thoughts:  No  Memory:  Immediate;   Fair Recent;   Fair Remote;   Fair  Judgement:  Fair  Insight:  Fair  Psychomotor Activity:  Decreased  Concentration:   Concentration: Fair  Recall:  [employer] of Knowledge:  Fair  Language:  Fair  Akathisia:  No  Handed:  Right  AIMS (if indicated):     Assets:  Desire for Improvement Physical Health Resilience  ADL's:  Intact  Cognition:  Impaired,  Mild  Sleep:  Number of Hours: 6.45     Have you used any form of tobacco in the last 30 days? (Cigarettes, Smokeless Tobacco, Cigars, and/or Pipes): No  Has this patient used any form of tobacco in the last 30 days? (Cigarettes, Smokeless Tobacco, Cigars, and/or Pipes) Yes, No  Blood Alcohol level:  Lab Results  Component Value Date   ETH <10 41/32/4401    Metabolic Disorder Labs:  No results found for: HGBA1C, MPG No results found for: PROLACTIN No results found for: CHOL, TRIG, HDL, CHOLHDL, VLDL, LDLCALC  See Psychiatric Specialty Exam and Suicide Risk Assessment completed by Attending Physician prior to discharge.  Discharge destination:  Home  Is patient on multiple antipsychotic therapies at discharge:  No   Has Patient had three or more failed  trials of antipsychotic monotherapy by history:  No  Recommended Plan for Multiple Antipsychotic Therapies: NA  Discharge Instructions    Diet - low sodium heart healthy   Complete by:  As directed    Increase activity slowly   Complete by:  As directed      Allergies as of 02/08/2018   No Known Allergies     Medication List    STOP taking these medications   cyanocobalamin 1000 MCG tablet   feeding supplement (ENSURE ENLIVE) Liqd   ibuprofen 600 MG tablet Commonly known as:  ADVIL,MOTRIN   multivitamin with minerals Tabs tablet   predniSONE 10 MG (21) Tbpk tablet Commonly known as:  STERAPRED UNI-PAK 21 TAB   Vitamin D (Ergocalciferol) 50000 units Caps capsule Commonly known as:  DRISDOL     TAKE these medications     Indication  mirtazapine 45 MG tablet Commonly known as:  REMERON Take 1 tablet (45 mg total) by mouth at bedtime.  Indication:  Major Depressive  Disorder   traZODone 100 MG tablet Commonly known as:  DESYREL Take 1 tablet (100 mg total) by mouth at bedtime as needed for sleep.  Indication:  Trouble Sleeping, Major Depressive Disorder      Follow-up Information    [facility].   Why:  Please contact your case manager with [facility] within 7 days of your discharge.  The number is [phone].  Thank you. Contact information: [address] [place]  [address] [phone]       [place] on 02/12/2018.   Why:  02/12/2018 at 12:30pm for Hospital Follow up appointment. Contact information: [place] [address] [phone]        [facility] Follow up on 02/23/2018.   Why:  You have a follow-up ECT session with Dr. Weber Cooks on this date.  Please arrive by 8:30AM to check-in.  Thank you. Contact information: [place] [id number] ar [place] [phone]          Follow-up recommendations:  Activity:  Activity as tolerated Diet:  Regular diet Other:  Follow-up with [facility] and return for maintenance ECT  Comments: Return for maintenance ECT continue current medicine management  Signed: Alethia Berthold, MD 02/11/2018, 3:02 PM

## 2018-02-21 ENCOUNTER — Telehealth: Payer: Self-pay

## 2018-03-01 ENCOUNTER — Other Ambulatory Visit: Payer: Self-pay | Admitting: Psychiatry

## 2018-03-02 ENCOUNTER — Inpatient Hospital Stay: Admission: RE | Admit: 2018-03-02 | Payer: Self-pay | Source: Ambulatory Visit

## 2018-03-14 ENCOUNTER — Telehealth: Payer: Self-pay | Admitting: Family Medicine

## 2018-03-14 ENCOUNTER — Ambulatory Visit (INDEPENDENT_AMBULATORY_CARE_PROVIDER_SITE_OTHER): Payer: BLUE CROSS/BLUE SHIELD | Admitting: Family Medicine

## 2018-03-14 ENCOUNTER — Encounter

## 2018-03-14 ENCOUNTER — Other Ambulatory Visit: Payer: Self-pay

## 2018-03-14 ENCOUNTER — Encounter: Payer: Self-pay | Admitting: Family Medicine

## 2018-03-14 VITALS — BP 130/80 | HR 80 | Temp 98.3°F | Resp 16 | Ht 73.0 in | Wt 181.6 lb

## 2018-03-14 DIAGNOSIS — E538 Deficiency of other specified B group vitamins: Secondary | ICD-10-CM

## 2018-03-14 DIAGNOSIS — Z1211 Encounter for screening for malignant neoplasm of colon: Secondary | ICD-10-CM

## 2018-03-14 DIAGNOSIS — Z79899 Other long term (current) drug therapy: Secondary | ICD-10-CM | POA: Insufficient documentation

## 2018-03-14 DIAGNOSIS — R29898 Other symptoms and signs involving the musculoskeletal system: Secondary | ICD-10-CM

## 2018-03-14 DIAGNOSIS — Z1212 Encounter for screening for malignant neoplasm of rectum: Secondary | ICD-10-CM

## 2018-03-14 DIAGNOSIS — Z7689 Persons encountering health services in other specified circumstances: Secondary | ICD-10-CM | POA: Diagnosis not present

## 2018-03-14 DIAGNOSIS — M545 Low back pain, unspecified: Secondary | ICD-10-CM

## 2018-03-14 DIAGNOSIS — B351 Tinea unguium: Secondary | ICD-10-CM | POA: Insufficient documentation

## 2018-03-14 DIAGNOSIS — Z23 Encounter for immunization: Secondary | ICD-10-CM | POA: Diagnosis not present

## 2018-03-14 DIAGNOSIS — Z13220 Encounter for screening for lipoid disorders: Secondary | ICD-10-CM

## 2018-03-14 DIAGNOSIS — Z125 Encounter for screening for malignant neoplasm of prostate: Secondary | ICD-10-CM

## 2018-03-14 DIAGNOSIS — H539 Unspecified visual disturbance: Secondary | ICD-10-CM

## 2018-03-14 DIAGNOSIS — E559 Vitamin D deficiency, unspecified: Secondary | ICD-10-CM

## 2018-03-14 DIAGNOSIS — R748 Abnormal levels of other serum enzymes: Secondary | ICD-10-CM | POA: Insufficient documentation

## 2018-03-14 DIAGNOSIS — F332 Major depressive disorder, recurrent severe without psychotic features: Secondary | ICD-10-CM | POA: Diagnosis not present

## 2018-03-14 NOTE — Patient Instructions (Signed)
We have referred you to psychiatry.  If you do not hear back from our office in 2 weeks regarding this referral, please call our office.

## 2018-03-14 NOTE — Telephone Encounter (Signed)
Please call [facility] - I need to know how long patient has been taking Lamisil for Athlete's foot.  Did he take while he was inpatient there or was he only given the 15 tablet Rx as an outpatient?

## 2018-03-14 NOTE — Progress Notes (Signed)
Name: [name]   MRN: [id number]    DOB: 1955/09/27   Date:03/14/2018       Progress Note  Subjective  Chief Complaint  Chief Complaint  Patient presents with  . Establish Care  . Mental Health Problem    No PCP was a patientof Dr. Shawnie Pons [facility]  . Depression    HPI  Pt presents to establish care and for the following concerns:  Severe Depression: He was recently admitted for inpatient care due to [facility] and severe depression, had ECT treatment which did help his symptoms.  He was admitted for about 15-20 days - admission was started in [facility] ER on 02/10/2018 and was transferred to [facility] in [place].  Missed appt for ECT on 03/02/2018 - he has been seeing him for ECT Treatment. He notes that he is not sure if he has an ongoing psychiatrist now that he was discharged from [facility].  He has very flat affect and slow speech today in office - he states that his Yolanda Bonine usually takes care of his appointments Anastasia Pall).  Onychomycosis: He was diagnosed while admitted at [facility].  He took 15 days of Terbinafine with only minimal relief of symptoms.  He is unsure if he was also taking the Terbinafine at [facility] during admission - we will request records.  He is open to podiatry referral if terbinadine is not effective.  We will check LFT's today before providing refill.  Chronic Low Back Pain: Feels like he has weak low back muscles.  He had MRI of the lumbar back on 01/08/2018 when he was hospitalized for weakness and depression.  Polymyositis was ruled out, pt was started on Medrol dose pack and given ibuprofen for myositis; did see neurology during this stay.  He had an elevated CK, and we will recheck this today.  He notes that his weakness improves with exercise, but overall has not improved since his hospitalization.  MRI from 01/08/2018 showed the following: IMPRESSION: 1. No evidence for cord compression or cauda equina. 2. Soft tissue edema involving the posterior  paraspinous musculature bilaterally, right slightly greater than left. Finding is of uncertain etiology or significance, but could reflect acute myositis or possibly muscular injury/strain. Correlation with history and CPK suggested. 3. Shallow right foraminal disc protrusion at L3-4, contacting and potentially affecting the right L3 nerve root. 4. Disc bulge with facet hypertrophy at L4-5 and L5-S1 with resultant mild bilateral L4 with mild to moderate left L5 foraminal stenosis. Electronically Signed   By: Jeannine Boga M.D.   On: 01/08/2018 22:05  Vitamin Deficiencies: Vitamin D and B12 were low during January 08 2018 admission - he has been taking PO vitamins, we will check labs today.  Visual Disturbance: He notes he saw his eye doctor a few months ago and noted some occasional "spots" in his vision.  Was told to have his carotid arteries checked - we will auscultate today.  Nocturia: Reports ongoing nocturia, would like PSA checked today - we will perform this today.  Patient Active Problem List   Diagnosis Date Noted  . Catatonia 01/12/2018  . Malnutrition of moderate degree 01/09/2018  . Pressure injury of skin 01/09/2018  . Severe recurrent major depression without psychotic features ([place]) 01/09/2018  . Back pain 01/08/2018  . Shuffling gait 01/08/2018  . Depression 01/08/2018    Past Surgical History:  Procedure Laterality Date  . HERNIA REPAIR      No family history on file.  Social History   Socioeconomic History  . Marital status: Married    Spouse name: Reitta  . Number of children: 1  . Years of education: Not on file  . Highest education level: Not on file  Occupational History  . Occupation: retired  Scientific laboratory technician  . Financial resource strain: Not hard at all  . Food insecurity:    Worry: Never true    Inability: Never true  . Transportation needs:    Medical: No    Non-medical: No  Tobacco Use  . Smoking status: Former Research scientist (life sciences)  . Smokeless  tobacco: Former Systems developer    Quit date: 01/10/2017  Substance and Sexual Activity  . Alcohol use: Not Currently  . Drug use: Not Currently  . Sexual activity: Not Currently  Lifestyle  . Physical activity:    Days per week: 0 days    Minutes per session: 0 min  . Stress: To some extent  Relationships  . Social connections:    Talks on phone: Once a week    Gets together: Once a week    Attends religious service: Never    Active member of club or organization: No    Attends meetings of clubs or organizations: Never    Relationship status: Married  . Intimate partner violence:    Fear of current or ex partner: No    Emotionally abused: No    Physically abused: No    Forced sexual activity: No  Other Topics Concern  . Not on file  Social History Narrative  . Not on file     Current Outpatient Medications:  .  mirtazapine (REMERON) 45 MG tablet, Take 1 tablet (45 mg total) by mouth at bedtime., Disp: 30 tablet, Rfl: 2 .  terbinafine (LAMISIL) 250 MG tablet, , Disp: , Rfl: 0 .  traZODone (DESYREL) 100 MG tablet, Take 1 tablet (100 mg total) by mouth at bedtime as needed for sleep., Disp: 30 tablet, Rfl: 2  No Known Allergies  ROS Constitutional: Negative for fever or weight change.  Respiratory: Negative for cough and shortness of breath.   Cardiovascular: Negative for chest pain or palpitations.  Gastrointestinal: Negative for abdominal pain, no bowel changes.  Musculoskeletal: Negative for gait problem or joint swelling.  Skin: Negative for rash.  Neurological: Negative for dizziness or headache.  No other specific complaints in a complete review of systems (except as listed in HPI above).  Objective  Vitals:   03/14/18 1038  BP: 130/80  Pulse: 80  Resp: 16  Temp: 98.3 F (36.8 C)  TempSrc: Oral  SpO2: 96%  Weight: 181 lb 9.6 oz (82.4 kg)  Height: 6\' 1"  (1.854 m)   Body mass index is 23.96 kg/m.  Physical Exam Constitutional: Patient appears well-developed and  well-nourished. No distress.  HENT: Head: Normocephalic and atraumatic. Nose: Nose normal. Mouth/Throat: Oropharynx is clear and moist. No oropharyngeal exudate.  Eyes: Conjunctivae and EOM are normal. Pupils are equal, round, and reactive to light. No scleral icterus.  Neck: Normal range of motion. Neck supple. No JVD present. No thyromegaly present. No carotid bruit bilaterally. Cardiovascular: Normal rate, regular rhythm and normal heart sounds.  No murmur heard. No BLE edema. Pulmonary/Chest: Effort normal and breath sounds normal. No respiratory distress. Musculoskeletal: Normal range of motion, no joint effusions. No gross deformities Neurological: he is alert and oriented to person, place, and time. No cranial nerve deficit. Coordination, balance, strength, speech and gait are normal.  Skin: Onychomycosis to right great toe and  on all nails of the left food; scaling skin to bilateral feed consistent with fungal infection.  Psychiatric: Very flat affect in office, slow speech; responses are appropriate.    No results found for this or any previous visit (from the past 72 hour(s)).  PHQ2/9: Depression screen PHQ 2/9 03/14/2018  Decreased Interest 3  Down, Depressed, Hopeless 1  PHQ - 2 Score 4  Altered sleeping 0  Tired, decreased energy 2  Change in appetite 0  Feeling bad or failure about yourself  1  Trouble concentrating 2  Moving slowly or fidgety/restless 0  PHQ-9 Score 9  Difficult doing work/chores Somewhat difficult   Fall Risk: Fall Risk  03/14/2018  Falls in the past year? Yes  Number falls in past yr: 2 or more  Injury with Fall? No  Risk Factor Category  High Fall Risk   Assessment & Plan  1. Severe recurrent major depression without psychotic features ([place]) - Continue medications as prescribed - Ambulatory referral to Psychiatry  2. Encounter to establish care  3. Onychomycosis - Will provide additional refill for 6 week total treatment after LFT's are  returned and records from [facility] determine how long he has had treatment thus far.  4. High risk medications (not anticoagulants) long-term use - COMPLETE METABOLIC PANEL WITH GFR - CBC w/Diff/Platelet  5. Bilateral low back pain without sciatica, unspecified chronicity - Ambulatory referral to Spine Surgery  6. Weakness of back - Ambulatory referral to Spine Surgery  7. Elevated CK - CK (Creatine Kinase) - Ambulatory referral to Spine Surgery  8. Vitamin D deficiency - Vitamin D (25 hydroxy)  9. B12 deficiency - Vitamin B12  10. Screening for hyperlipidemia - Lipid panel  11. Prostate cancer screening - PSA  12. Encounter for colorectal cancer screening - Ambulatory referral to Gastroenterology  13. Need for Tdap vaccination - Tdap vaccine greater than or equal to 7yo IM  14. Needs flu shot - Flu Vaccine QUAD 6+ mos PF IM (Fluarix Quad PF)  15. Visual disturbance - Carotid arteries without bruit - if persistent, we will order vascular US.

## 2018-03-15 ENCOUNTER — Other Ambulatory Visit: Payer: Self-pay | Admitting: Family Medicine

## 2018-03-15 DIAGNOSIS — R945 Abnormal results of liver function studies: Principal | ICD-10-CM

## 2018-03-15 DIAGNOSIS — B351 Tinea unguium: Secondary | ICD-10-CM

## 2018-03-15 DIAGNOSIS — R7989 Other specified abnormal findings of blood chemistry: Secondary | ICD-10-CM

## 2018-03-15 LAB — CBC WITH DIFFERENTIAL/PLATELET
Basophils Absolute: 29 cells/uL (ref 0–200)
Basophils Relative: 0.6 %
Eosinophils Absolute: 39 cells/uL (ref 15–500)
Eosinophils Relative: 0.8 %
HCT: 39.8 % (ref 38.5–50.0)
Hemoglobin: 13.3 g/dL (ref 13.2–17.1)
Lymphs Abs: 1039 cells/uL (ref 850–3900)
MCH: 30.3 pg (ref 27.0–33.0)
MCHC: 33.4 g/dL (ref 32.0–36.0)
MCV: 90.7 fL (ref 80.0–100.0)
MPV: 10.8 fL (ref 7.5–12.5)
Monocytes Relative: 8 %
Neutro Abs: 3401 cells/uL (ref 1500–7800)
Neutrophils Relative %: 69.4 %
Platelets: 226 10*3/uL (ref 140–400)
RBC: 4.39 10*6/uL (ref 4.20–5.80)
RDW: 12.6 % (ref 11.0–15.0)
Total Lymphocyte: 21.2 %
WBC mixed population: 392 cells/uL (ref 200–950)
WBC: 4.9 10*3/uL (ref 3.8–10.8)

## 2018-03-15 LAB — HEPATIC FUNCTION PANEL
AG RATIO: 1.6 (calc) (ref 1.0–2.5)
ALBUMIN MSPROF: 4.3 g/dL (ref 3.6–5.1)
ALKALINE PHOSPHATASE (APISO): 72 U/L (ref 40–115)
ALT: 92 U/L — AB (ref 9–46)
AST: 52 U/L — ABNORMAL HIGH (ref 10–35)
Bilirubin, Direct: 0.1 mg/dL (ref 0.0–0.2)
GLOBULIN: 2.7 g/dL (ref 1.9–3.7)
Indirect Bilirubin: 0.4 mg/dL (calc) (ref 0.2–1.2)
TOTAL PROTEIN: 7 g/dL (ref 6.1–8.1)
Total Bilirubin: 0.5 mg/dL (ref 0.2–1.2)

## 2018-03-15 LAB — COMPLETE METABOLIC PANEL WITH GFR
AG Ratio: 1.6 (calc) (ref 1.0–2.5)
ALT: 92 U/L — AB (ref 9–46)
AST: 52 U/L — AB (ref 10–35)
Albumin: 4.3 g/dL (ref 3.6–5.1)
Alkaline phosphatase (APISO): 72 U/L (ref 40–115)
BUN: 18 mg/dL (ref 7–25)
CALCIUM: 9.5 mg/dL (ref 8.6–10.3)
CO2: 28 mmol/L (ref 20–32)
CREATININE: 0.98 mg/dL (ref 0.70–1.25)
Chloride: 105 mmol/L (ref 98–110)
GFR, EST NON AFRICAN AMERICAN: 82 mL/min/{1.73_m2} (ref >=60)
GFR, Est African American: 95 mL/min/{1.73_m2} (ref >=60)
GLUCOSE: 85 mg/dL (ref 65–99)
Globulin: 2.7 g/dL (calc) (ref 1.9–3.7)
POTASSIUM: 4.7 mmol/L (ref 3.5–5.3)
Sodium: 140 mmol/L (ref 135–146)
TOTAL PROTEIN: 7 g/dL (ref 6.1–8.1)
Total Bilirubin: 0.5 mg/dL (ref 0.2–1.2)

## 2018-03-15 LAB — VITAMIN D 25 HYDROXY (VIT D DEFICIENCY, FRACTURES): Vit D, 25-Hydroxy: 21 ng/mL — ABNORMAL LOW (ref 30–100)

## 2018-03-15 LAB — LIPID PANEL
Cholesterol: 176 mg/dL (ref <200)
HDL: 52 mg/dL (ref >40)
LDL Cholesterol (Calc): 107 mg/dL (calc) — ABNORMAL HIGH
Non-HDL Cholesterol (Calc): 124 mg/dL (calc) (ref <130)
Total CHOL/HDL Ratio: 3.4 (calc) (ref <5.0)
Triglycerides: 77 mg/dL (ref <150)

## 2018-03-15 LAB — TEST AUTHORIZATION

## 2018-03-15 LAB — VITAMIN B12: VITAMIN B 12: 566 pg/mL (ref 200–1100)

## 2018-03-15 LAB — CK: CK TOTAL: 100 U/L (ref 44–196)

## 2018-03-15 LAB — PSA: PSA: 2.6 ng/mL (ref <=4.0)

## 2018-03-15 MED ORDER — TERBINAFINE HCL 250 MG PO TABS: 250.0000 mg | ORAL_TABLET | Freq: Every day | ORAL | 0 refills | Status: AC

## 2018-03-15 NOTE — Telephone Encounter (Signed)
Called and left message for Medical Records to call. Spoke to [employer] they filled it for 1st time on 8/15

## 2018-03-15 NOTE — Progress Notes (Signed)
30-day supply of Lamisil refilled. Will continue to monitor LFT's - pt to schedule follow up in 1-2 weeks with our office.

## 2018-03-19 ENCOUNTER — Other Ambulatory Visit: Payer: Self-pay

## 2018-03-19 MED ORDER — NA SULFATE-K SULFATE-MG SULF 17.5-3.13-1.6 GM/177ML PO SOLN: 1.0000 | Freq: Once | ORAL | 0 refills | Status: AC

## 2018-03-21 ENCOUNTER — Encounter: Payer: Self-pay | Admitting: *Deleted

## 2018-03-22 ENCOUNTER — Encounter: Payer: Self-pay | Admitting: *Deleted

## 2018-03-22 ENCOUNTER — Ambulatory Visit
Admission: RE | Admit: 2018-03-22 | Discharge: 2018-03-22 | Disposition: A | Discharge status: Home / Self Care | Payer: BLUE CROSS/BLUE SHIELD | Source: Ambulatory Visit | Attending: Gastroenterology | Admitting: Gastroenterology

## 2018-03-22 ENCOUNTER — Ambulatory Visit: Payer: BLUE CROSS/BLUE SHIELD | Admitting: Anesthesiology

## 2018-03-22 ENCOUNTER — Encounter
Admission: RE | Disposition: A | Discharge status: Home / Self Care | Payer: Self-pay | Source: Ambulatory Visit | Attending: Gastroenterology

## 2018-03-22 DIAGNOSIS — F329 Major depressive disorder, single episode, unspecified: Secondary | ICD-10-CM | POA: Insufficient documentation

## 2018-03-22 DIAGNOSIS — D125 Benign neoplasm of sigmoid colon: Secondary | ICD-10-CM

## 2018-03-22 DIAGNOSIS — Z1211 Encounter for screening for malignant neoplasm of colon: Secondary | ICD-10-CM | POA: Diagnosis present

## 2018-03-22 DIAGNOSIS — D122 Benign neoplasm of ascending colon: Secondary | ICD-10-CM | POA: Diagnosis not present

## 2018-03-22 DIAGNOSIS — K5730 Diverticulosis of large intestine without perforation or abscess without bleeding: Secondary | ICD-10-CM | POA: Insufficient documentation

## 2018-03-22 DIAGNOSIS — Z87891 Personal history of nicotine dependence: Secondary | ICD-10-CM | POA: Diagnosis not present

## 2018-03-22 DIAGNOSIS — K640 First degree hemorrhoids: Secondary | ICD-10-CM | POA: Diagnosis not present

## 2018-03-22 HISTORY — PX: COLONOSCOPY WITH PROPOFOL: SHX5780

## 2018-03-22 SURGERY — COLONOSCOPY WITH PROPOFOL
Anesthesia: General

## 2018-03-22 MED ORDER — PROPOFOL 10 MG/ML IV BOLUS
INTRAVENOUS | Status: AC
  Filled 2018-03-22: qty 20

## 2018-03-22 MED ORDER — PROPOFOL 10 MG/ML IV BOLUS
INTRAVENOUS | Status: DC | PRN
  Administered 2018-03-22: 50 mg via INTRAVENOUS
  Administered 2018-03-22 (×3): 20 mg via INTRAVENOUS

## 2018-03-22 MED ORDER — LIDOCAINE HCL (CARDIAC) PF 100 MG/5ML IV SOSY
PREFILLED_SYRINGE | INTRAVENOUS | Status: DC | PRN
  Administered 2018-03-22: 80 mg via INTRATRACHEAL

## 2018-03-22 MED ORDER — PROPOFOL 500 MG/50ML IV EMUL
INTRAVENOUS | Status: DC | PRN
  Administered 2018-03-22: 100 ug/kg/min via INTRAVENOUS

## 2018-03-22 MED ORDER — SODIUM CHLORIDE 0.9 % IV SOLN
INTRAVENOUS | Status: DC
  Administered 2018-03-22 (×2): via INTRAVENOUS

## 2018-03-22 MED ORDER — PROPOFOL 500 MG/50ML IV EMUL
INTRAVENOUS | Status: AC
  Filled 2018-03-22: qty 50

## 2018-03-22 NOTE — Anesthesia Postprocedure Evaluation (Signed)
Anesthesia Post Note  Patient: [name]  Procedure(s) Performed: COLONOSCOPY WITH PROPOFOL (N/A )  Patient location during evaluation: PACU Anesthesia Type: General Level of consciousness: awake Pain management: pain level controlled Vital Signs Assessment: post-procedure vital signs reviewed and stable Respiratory status: spontaneous breathing, nonlabored ventilation, respiratory function stable and patient connected to nasal cannula oxygen Cardiovascular status: blood pressure returned to baseline and stable Postop Assessment: no apparent nausea or vomiting Anesthetic complications: no     Last Vitals:  Vitals:   03/22/18 0812 03/22/18 0912  BP: 121/80 99/65  Pulse: 79   Resp: 15   Temp: (!) 35.9 C 36.6 C  SpO2: 99%     Last Pain:  Vitals:   03/22/18 0912  TempSrc: Tympanic                 Durenda Hurt

## 2018-03-22 NOTE — Op Note (Signed)
[facility] Patient Name: [name] Procedure Date: 03/22/2018 8:08 AM MRN: [id number] Account #: [id number] Date of Birth: [birth year] Admit Type: Outpatient Age: 62 Room: [facility] ENDO ROOM 4 Gender: Male Note Status: Finalized Procedure:            Colonoscopy Indications:          Screening for colorectal malignant neoplasm Providers:            Jonathon Bellows MD, MD Medicines:            Monitored Anesthesia Care Complications:        No immediate complications. Procedure:            Pre-Anesthesia Assessment:                       - Prior to the procedure, a History and Physical was                        performed, and patient medications, allergies and                        sensitivities were reviewed. The patient's tolerance of                        previous anesthesia was reviewed.                       - The risks and benefits of the procedure and the                        sedation options and risks were discussed with the                        patient. All questions were answered and informed                        consent was obtained.                       - The risks and benefits of the procedure and the                        sedation options and risks were discussed with the                        patient. All questions were answered and informed                        consent was obtained.                       - ASA Grade Assessment: II - A patient with mild                        systemic disease.                       After obtaining informed consent, the colonoscope was                        passed under direct vision. Throughout the procedure,  the patient's blood pressure, pulse, and oxygen                        saturations were monitored continuously. The                        Colonoscope was introduced through the anus and                        advanced to the the cecum, identified by the                  appendiceal orifice, IC valve and transillumination.                        The colonoscopy was performed with ease. The patient                        tolerated the procedure well. The quality of the bowel                        preparation was good. Findings:      The perianal and digital rectal examinations were normal.      Non-bleeding internal hemorrhoids were found during retroflexion. The       hemorrhoids were medium-sized and Grade I (internal hemorrhoids that do       not prolapse).      Multiple small-mouthed diverticula were found in the entire colon.      A 5 mm polyp was found in the sigmoid colon. The polyp was sessile. The       polyp was removed with a cold snare. Resection and retrieval were       complete.      A 7 mm polyp was found in the sigmoid colon. The polyp was sessile. The       polyp was removed with a hot snare. Resection and retrieval were       complete.      A 20 to 25 mm, non-bleeding polyp was found in the proximal ascending       colon. The polyp was flat. The polyp was a LST extending over three       folds, starting atthe mucosa opposite to the IC valve and extending over       two or three folds distally into the ascending colon . No biopsies or       samples taken to prevent fibrosis      The exam was otherwise without abnormality on direct and retroflexion       views. Impression:           - Non-bleeding internal hemorrhoids.                       - Diverticulosis in the entire examined colon.                       - One 5 mm polyp in the sigmoid colon, removed with a                        cold snare. Resected and retrieved.                       - One 7 mm  polyp in the sigmoid colon, removed with a                        hot snare. Resected and retrieved.                       - One 20 to 25 mm, non-bleeding polyp in the proximal                        ascending colon.                       - The examination was otherwise  normal on direct and                        retroflexion views. Recommendation:       - Discharge patient to home (with escort).                       - Resume previous diet.                       - Continue present medications.                       - Await pathology results.                       - Refer to [facility] advanced endoscopy to resect large                        flat polyp in proximal ascending colon Procedure Code(s):    --- Professional ---                       [phone], Colonoscopy, flexible; with removal of tumor(s),                        polyp(s), or other lesion(s) by snare technique Diagnosis Code(s):    --- Professional ---                       Z12.11, Encounter for screening for malignant neoplasm                        of colon                       K64.0, First degree hemorrhoids                       D12.5, Benign neoplasm of sigmoid colon                       D12.2, Benign neoplasm of ascending colon                       K57.30, Diverticulosis of large intestine without                        perforation or abscess without bleeding CPT copyright 2017 American Medical Association. All rights reserved. The codes documented in this report are preliminary and upon coder review may  be revised to meet current compliance  requirements. Jonathon Bellows, MD Jonathon Bellows MD, MD 03/22/2018 9:12:01 AM This report has been signed electronically. Number of Addenda: 0 Note Initiated On: 03/22/2018 8:08 AM Scope Withdrawal Time: 0 hours 19 minutes 12 seconds  Total Procedure Duration: 0 hours 25 minutes 32 seconds       [facility]

## 2018-03-22 NOTE — H&P (Signed)
Jonathon Bellows, MD [address] [place], [place], [place], [address] [address], [place], [place], [place], [address] Phone: [phone]  Fax: [phone]  Primary Care Physician:  Hubbard Hartshorn, FNP   Pre-Procedure History & Physical: HPI:  [name] is a 62 y.o. male is here for an colonoscopy.   Past Medical History:  Diagnosis Date  . Depression     Past Surgical History:  Procedure Laterality Date  . HERNIA REPAIR      Prior to Admission medications   Medication Sig Start Date End Date Taking? Authorizing Provider  mirtazapine (REMERON) 45 MG tablet Take 1 tablet (45 mg total) by mouth at bedtime. 02/08/18  Yes Clapacs, Madie [place], MD  terbinafine (LAMISIL) 250 MG tablet Take 1 tablet (250 mg total) by mouth daily. 03/15/18 04/14/18  Hubbard Hartshorn, FNP  traZODone (DESYREL) 100 MG tablet Take 1 tablet (100 mg total) by mouth at bedtime as needed for sleep. Patient not taking: Reported on 03/22/2018 02/08/18   Clapacs, Madie Reno, MD    Allergies as of 03/14/2018  . (No Known Allergies)    History reviewed. No pertinent family history.  Social History   Socioeconomic History  . Marital status: Married    Spouse name: Reitta  . Number of children: 1  . Years of education: Not on file  . Highest education level: Not on file  Occupational History  . Occupation: retired  Scientific laboratory technician  . Financial resource strain: Not hard at all  . Food insecurity:    Worry: Never true    Inability: Never true  . Transportation needs:    Medical: No    Non-medical: No  Tobacco Use  . Smoking status: Former Research scientist (life sciences)  . Smokeless tobacco: Former Systems developer    Quit date: 01/10/2017  Substance and Sexual Activity  . Alcohol use: Not Currently  . Drug use: Not Currently  . Sexual activity: Not Currently  Lifestyle  . Physical activity:    Days per week: 0 days    Minutes per session: 0 min  . Stress: To some extent  Relationships  . Social connections:    Talks on phone: Once  a week    Gets together: Once a week    Attends religious service: Never    Active member of club or organization: No    Attends meetings of clubs or organizations: Never    Relationship status: Married  . Intimate partner violence:    Fear of current or ex partner: No    Emotionally abused: No    Physically abused: No    Forced sexual activity: No  Other Topics Concern  . Not on file  Social History Narrative  . Not on file    Review of Systems: See HPI, otherwise negative ROS  Physical Exam: BP 121/80   Pulse 79   Temp (!) 96.7 F (35.9 C) (Tympanic)   Resp 15   Ht 5\' 11"  (1.803 m)   Wt 81.6 kg   SpO2 99%   BMI 25.10 kg/m  General:   Alert,  pleasant and cooperative in NAD Head:  Normocephalic and atraumatic. Neck:  Supple; no masses or thyromegaly. Lungs:  Clear throughout to auscultation, normal respiratory effort.    Heart:  +S1, +S2, Regular rate and rhythm, No edema. Abdomen:  Soft, nontender and nondistended. Normal bowel sounds, without guarding, and without rebound.   Neurologic:  Alert and  oriented x4;  grossly normal neurologically.  Impression/Plan: [name] is here for an colonoscopy to be performed for Screening colonoscopy average risk   Risks, benefits, limitations, and alternatives regarding  colonoscopy have been reviewed with the patient.  Questions have been answered.  All parties agreeable.   Jonathon Bellows, MD  03/22/2018, 8:25 AM

## 2018-03-22 NOTE — Transfer of Care (Signed)
Immediate Anesthesia Transfer of Care Note  Patient: [name]  Procedure(s) Performed: COLONOSCOPY WITH PROPOFOL (N/A )  Patient Location: PACU  Anesthesia Type:General  Level of Consciousness: awake  Airway & Oxygen Therapy: Patient Spontanous Breathing  Post-op Assessment: Report given to RN  Post vital signs: Reviewed  Last Vitals:  Vitals Value Taken Time  BP    Temp    Pulse    Resp    SpO2      Last Pain:  Vitals:   03/22/18 0912  TempSrc: Tympanic         Complications: No apparent anesthesia complications

## 2018-03-22 NOTE — Anesthesia Preprocedure Evaluation (Addendum)
Anesthesia Evaluation  Patient identified by MRN, date of birth, ID band Patient awake    Reviewed: Allergy & Precautions, H&P , NPO status , Patient's Chart, lab work & pertinent test results  Airway Mallampati: II  TM Distance: >3 FB Neck ROM: full    Dental  (+) Teeth Intact   Pulmonary neg pulmonary ROS, former smoker,    breath sounds clear to auscultation       Cardiovascular negative cardio ROS   Rhythm:regular Rate:Normal     Neuro/Psych PSYCHIATRIC DISORDERS Depression negative neurological ROS  negative psych ROS   GI/Hepatic negative GI ROS, Neg liver ROS,   Endo/Other  negative endocrine ROS  Renal/GU negative Renal ROS  negative genitourinary   Musculoskeletal   Abdominal   Peds  Hematology negative hematology ROS (+)   Anesthesia Other Findings Past Medical History: No date: Depression  Past Surgical History: No date: HERNIA REPAIR     Reproductive/Obstetrics negative OB ROS                            Anesthesia Physical Anesthesia Plan  ASA: II  Anesthesia Plan: General   Post-op Pain Management:    Induction:   PONV Risk Score and Plan: Propofol infusion and TIVA  Airway Management Planned: Natural Airway and Nasal Cannula  Additional Equipment:   Intra-op Plan:   Post-operative Plan:   Informed Consent: I have reviewed the patients History and Physical, chart, labs and discussed the procedure including the risks, benefits and alternatives for the proposed anesthesia with the patient or authorized representative who has indicated his/her understanding and acceptance.   Dental Advisory Given  Plan Discussed with: Anesthesiologist, CRNA and Surgeon  Anesthesia Plan Comments:         Anesthesia Quick Evaluation

## 2018-03-22 NOTE — Anesthesia Post-op Follow-up Note (Signed)
Anesthesia QCDR form completed.        

## 2018-03-23 LAB — SURGICAL PATHOLOGY

## 2018-03-26 ENCOUNTER — Encounter: Payer: Self-pay | Admitting: Gastroenterology

## 2018-03-26 ENCOUNTER — Telehealth: Payer: Self-pay | Admitting: Family Medicine

## 2018-03-26 ENCOUNTER — Ambulatory Visit (INDEPENDENT_AMBULATORY_CARE_PROVIDER_SITE_OTHER): Payer: BLUE CROSS/BLUE SHIELD | Admitting: Family Medicine

## 2018-03-26 VITALS — BP 118/82 | HR 73 | Resp 18 | Ht 73.0 in | Wt 185.1 lb

## 2018-03-26 DIAGNOSIS — R29898 Other symptoms and signs involving the musculoskeletal system: Secondary | ICD-10-CM | POA: Diagnosis not present

## 2018-03-26 DIAGNOSIS — B351 Tinea unguium: Secondary | ICD-10-CM

## 2018-03-26 DIAGNOSIS — R600 Localized edema: Secondary | ICD-10-CM

## 2018-03-26 DIAGNOSIS — F332 Major depressive disorder, recurrent severe without psychotic features: Secondary | ICD-10-CM

## 2018-03-26 DIAGNOSIS — R351 Nocturia: Secondary | ICD-10-CM

## 2018-03-26 DIAGNOSIS — M545 Low back pain, unspecified: Secondary | ICD-10-CM

## 2018-03-26 DIAGNOSIS — Z79899 Other long term (current) drug therapy: Secondary | ICD-10-CM

## 2018-03-26 DIAGNOSIS — N3941 Urge incontinence: Secondary | ICD-10-CM

## 2018-03-26 NOTE — Telephone Encounter (Signed)
Please check on psychiatry referral. Thanks!

## 2018-03-26 NOTE — Progress Notes (Signed)
Name: [name]   MRN: [id number]    DOB: March 11, 1956   Date:03/26/2018       Progress Note  Subjective  Chief Complaint  Chief Complaint  Patient presents with  . Follow-up  . Foot Swelling    HPI  Onycomychosis: Taking Lamisil, he had slightly elevated LFT's at last visit, so we will recheck these today.  Doing well on medication; has not noticed any major changes in his toenails - advised it can take up to a year for infection to clear.  Bilateral foot swelling: Has been going on for 1-2 years, and has not worsened in severity. Does go away most of the time at night.  Denies chest pain or shortness of breath.  We discussed possible causes including venous insufficiency, CHF.  We will check BNP today.  Depression: He has not heard back regarding this referral - we will check on this today.  He thinks he may have gone to [place], but he's not sure - does not have regular follow up.  He is prescribed both trazodone and mirtazapine - he is unsure which he is taking regularly, but states he is only taking one of the medications daily.  PHQ-9 score is improved today. Denies SI/HI.   Office Visit from 03/26/2018 in [facility]  PHQ-9 Total Score  4     Chronic Low Back Pain: Has an appointment scheduled for 04/05/2018 for evaluation with Marin Olp PA-C. Still having sensation of muscle weakness - is open to physical therapy once he sees neurology.  CK was back to normal level at last visit.  Nocturia/Urinary Urgency/Urge Incontinenace: PSA was normal level at last visit; has had 2-3 episodes of urge incontinence over the last 3 months.  See below for AUA questionnaire.  IPSS Questionnaire (AUA-7): Over the past month.   1)  How often have you had a sensation of not emptying your bladder completely after you finish urinating?  0 - Not at all  2)  How often have you had to urinate again less than two hours after you finished urinating? 1 - Less than 1 time in 5  3)   How often have you found you stopped and started again several times when you urinated?  1 - Less than 1 time in 5  4) How difficult have you found it to postpone urination?  4 - More than half the time  5) How often have you had a weak urinary stream?  2 - Less than half the time  6) How often have you had to push or strain to begin urination?  0 - Not at all  7) How many times did you most typically get up to urinate from the time you went to bed until the time you got up in the morning?  2 - 2 times  Total score:  0-7 mildly symptomatic   8-19 moderately symptomatic   20-35 severely symptomatic  Symptom score of 11. Discussed starting BPH medication vs Urology referral - he would like to wait until his upcoming physical to decide.  Recent Colonoscopy: Dr. Vicente Males recommended referral to [facility] advanced endoscopy to resect large flat polyp in proximal ascending colon as he did not biopsy this during the colonoscopy. Pt denies blood in stool or dark/tarry stools. Encouraged him to keep this follow up.   CCM - discussed chronic care management program, patient declines referral today.  Patient Active Problem List   Diagnosis Date Noted  .  Vitamin D deficiency 03/14/2018  . Weakness of back 03/14/2018  . Elevated CK 03/14/2018  . B12 deficiency 03/14/2018  . High risk medications (not anticoagulants) long-term use 03/14/2018  . Onychomycosis 03/14/2018  . Catatonia 01/12/2018  . Malnutrition of moderate degree 01/09/2018  . Pressure injury of skin 01/09/2018  . Severe recurrent major depression without psychotic features ([place]) 01/09/2018  . Back pain 01/08/2018  . Shuffling gait 01/08/2018  . Depression 01/08/2018    Past Surgical History:  Procedure Laterality Date  . COLONOSCOPY WITH PROPOFOL N/A 03/22/2018   Procedure: COLONOSCOPY WITH PROPOFOL;  Surgeon: Jonathon Bellows, MD;  Location: [facility] ENDOSCOPY;  Service: Gastroenterology;  Laterality: N/A;  . HERNIA REPAIR      No family  history on file.  Social History   Socioeconomic History  . Marital status: Married    Spouse name: Reitta  . Number of children: 1  . Years of education: Not on file  . Highest education level: Not on file  Occupational History  . Occupation: retired  Scientific laboratory technician  . Financial resource strain: Not hard at all  . Food insecurity:    Worry: Never true    Inability: Never true  . Transportation needs:    Medical: No    Non-medical: No  Tobacco Use  . Smoking status: Former Research scientist (life sciences)  . Smokeless tobacco: Former Systems developer    Quit date: 01/10/2017  Substance and Sexual Activity  . Alcohol use: Not Currently  . Drug use: Not Currently  . Sexual activity: Not Currently  Lifestyle  . Physical activity:    Days per week: 0 days    Minutes per session: 0 min  . Stress: To some extent  Relationships  . Social connections:    Talks on phone: Once a week    Gets together: Once a week    Attends religious service: Never    Active member of club or organization: No    Attends meetings of clubs or organizations: Never    Relationship status: Married  . Intimate partner violence:    Fear of current or ex partner: No    Emotionally abused: No    Physically abused: No    Forced sexual activity: No  Other Topics Concern  . Not on file  Social History Narrative  . Not on file     Current Outpatient Medications:  .  mirtazapine (REMERON) 45 MG tablet, Take 1 tablet (45 mg total) by mouth at bedtime., Disp: 30 tablet, Rfl: 2 .  terbinafine (LAMISIL) 250 MG tablet, Take 1 tablet (250 mg total) by mouth daily., Disp: 30 tablet, Rfl: 0 .  traZODone (DESYREL) 100 MG tablet, Take 1 tablet (100 mg total) by mouth at bedtime as needed for sleep. (Patient not taking: Reported on 03/22/2018), Disp: 30 tablet, Rfl: 2  No Known Allergies  I personally reviewed active problem list, medication list, allergies, health maintenance, notes from last encounter with the patient/caregiver  today.  ROS  Constitutional: Negative for fever or weight change.  Respiratory: Negative for cough and shortness of breath.   Cardiovascular: Negative for chest pain or palpitations.  Gastrointestinal: Negative for abdominal pain, no bowel changes.  Musculoskeletal: Negative for gait problem or joint swelling.  Skin: Negative for rash.  Neurological: Negative for dizziness or headache.  No other specific complaints in a complete review of systems (except as listed in HPI above).  Objective  Vitals:   03/26/18 1057  BP: 118/82  Pulse: 73  Resp: 18  SpO2: 99%  Weight: 185 lb 1.6 oz (84 kg)  Height: 6\' 1"  (1.854 m)   Body mass index is 24.42 kg/m.  Physical Exam Constitutional: Patient appears well-developed and well-nourished. No distress.  HENT: Head: Normocephalic and atraumatic. Nose: Nose normal. Mouth/Throat: Oropharynx is clear and moist. No oropharyngeal exudate or tonsillar swelling.  Eyes: Conjunctivae and EOM are normal. No scleral icterus.  Pupils are equal, round, and reactive to light.  Neck: Normal range of motion. Neck supple. No JVD present. No thyromegaly present.  Cardiovascular: Normal rate, regular rhythm and normal heart sounds.  No murmur heard. +1 BLE edema. Pulmonary/Chest: Effort normal and breath sounds normal. No respiratory distress.. Musculoskeletal: Normal range of motion, no joint effusions. No gross deformities Neurological: Pt is alert and oriented to person, place, and time. No cranial nerve deficit. Coordination, balance, strength, speech and gait are normal.  Skin: Skin is warm and dry. No rash noted. No erythema.  Psychiatric: Patient has a normal mood and affect. behavior is normal. Judgment and thought content normal.  No results found for this or any previous visit (from the past 72 hour(s)).  PHQ2/9: Depression screen [facility] 2/9 03/26/2018 03/14/2018  Decreased Interest 2 3  Down, Depressed, Hopeless 0 1  PHQ - 2 Score 2 4  Altered  sleeping 0 0  Tired, decreased energy 1 2  Change in appetite 0 0  Feeling bad or failure about yourself  0 1  Trouble concentrating 1 2  Moving slowly or fidgety/restless 0 0  Suicidal thoughts 0 -  PHQ-9 Score 4 9  Difficult doing work/chores Somewhat difficult Somewhat difficult   Fall Risk: Fall Risk  03/26/2018 03/14/2018  Falls in the past year? - Yes  Number falls in past yr: 2 or more 2 or more  Injury with Fall? No No  Risk Factor Category  High Fall Risk High Fall Risk   Assessment & Plan  1. Onychomycosis - Complete Lamisil - COMPLETE METABOLIC PANEL WITH GFR  2. Severe recurrent major depression without psychotic features ([place]) - Stable, continue medications as prescribed; we will check on psychiatry referral  3. High risk medications (not anticoagulants) long-term use - COMPLETE METABOLIC PANEL WITH GFR - B Nat Peptide - CBC w/Diff/Platelet  4. Weakness of back - Stable, keep referral to spine specialty  5. Bilateral low back pain without sciatica, unspecified chronicity - Stable, keep referral to spine specialty  6. Bilateral lower extremity edema - Discussed differentials with patient - with start with laboratory evaluation. - COMPLETE METABOLIC PANEL WITH GFR - B Nat Peptide - CBC w/Diff/Platelet  7. Urge incontinence - He would like to wait until his upcoming physical to decide if he wants to see urology vs trial of medication management for BPH/Overactive bladder.  8. Nocturia - He would like to wait until his upcoming physical to decide if he wants to see urology vs trial of medication management for BPH/Overactive bladder.

## 2018-03-27 LAB — COMPLETE METABOLIC PANEL WITH GFR
AG RATIO: 1.5 (calc) (ref 1.0–2.5)
ALKALINE PHOSPHATASE (APISO): 71 U/L (ref 40–115)
ALT: 30 U/L (ref 9–46)
AST: 23 U/L (ref 10–35)
Albumin: 4.3 g/dL (ref 3.6–5.1)
BILIRUBIN TOTAL: 0.6 mg/dL (ref 0.2–1.2)
BUN: 20 mg/dL (ref 7–25)
CHLORIDE: 106 mmol/L (ref 98–110)
CO2: 30 mmol/L (ref 20–32)
Calcium: 9.3 mg/dL (ref 8.6–10.3)
Creat: 1.01 mg/dL (ref 0.70–1.25)
GFR, Est African American: 91 mL/min/{1.73_m2} (ref >=60)
GFR, Est Non African American: 79 mL/min/{1.73_m2} (ref >=60)
GLOBULIN: 2.8 g/dL (ref 1.9–3.7)
Glucose, Bld: 80 mg/dL (ref 65–99)
POTASSIUM: 4.9 mmol/L (ref 3.5–5.3)
SODIUM: 142 mmol/L (ref 135–146)
Total Protein: 7.1 g/dL (ref 6.1–8.1)

## 2018-03-27 LAB — CBC WITH DIFFERENTIAL/PLATELET
BASOS ABS: 32 {cells}/uL (ref 0–200)
Basophils Relative: 0.5 %
EOS PCT: 1.1 %
Eosinophils Absolute: 70 cells/uL (ref 15–500)
HCT: 40.9 % (ref 38.5–50.0)
HEMOGLOBIN: 13.5 g/dL (ref 13.2–17.1)
LYMPHS ABS: 1158 {cells}/uL (ref 850–3900)
MCH: 30.6 pg (ref 27.0–33.0)
MCHC: 33 g/dL (ref 32.0–36.0)
MCV: 92.7 fL (ref 80.0–100.0)
MPV: 10.5 fL (ref 7.5–12.5)
Monocytes Relative: 7.7 %
NEUTROS ABS: 4646 {cells}/uL (ref 1500–7800)
Neutrophils Relative %: 72.6 %
Platelets: 218 10*3/uL (ref 140–400)
RBC: 4.41 10*6/uL (ref 4.20–5.80)
RDW: 12.7 % (ref 11.0–15.0)
Total Lymphocyte: 18.1 %
WBC: 6.4 10*3/uL (ref 3.8–10.8)
WBCMIX: 493 {cells}/uL (ref 200–950)

## 2018-03-27 LAB — BRAIN NATRIURETIC PEPTIDE: BRAIN NATRIURETIC PEPTIDE: 78 pg/mL (ref <100)

## 2018-04-04 ENCOUNTER — Encounter: Payer: Self-pay | Admitting: Family Medicine

## 2018-04-04 ENCOUNTER — Ambulatory Visit (INDEPENDENT_AMBULATORY_CARE_PROVIDER_SITE_OTHER): Payer: Self-pay | Admitting: Family Medicine

## 2018-04-04 VITALS — BP 106/72 | HR 81 | Temp 98.3°F | Resp 16 | Ht 73.0 in | Wt 184.8 lb

## 2018-04-04 DIAGNOSIS — N529 Male erectile dysfunction, unspecified: Secondary | ICD-10-CM | POA: Insufficient documentation

## 2018-04-04 DIAGNOSIS — B351 Tinea unguium: Secondary | ICD-10-CM

## 2018-04-04 DIAGNOSIS — N401 Enlarged prostate with lower urinary tract symptoms: Secondary | ICD-10-CM | POA: Insufficient documentation

## 2018-04-04 DIAGNOSIS — Z0000 Encounter for general adult medical examination without abnormal findings: Secondary | ICD-10-CM

## 2018-04-04 DIAGNOSIS — R350 Frequency of micturition: Secondary | ICD-10-CM

## 2018-04-04 MED ORDER — TADALAFIL 10 MG PO TABS: 5.0000 mg | ORAL_TABLET | Freq: Every day | ORAL | 1 refills | Status: DC | PRN

## 2018-04-04 NOTE — Patient Instructions (Addendum)
Please call the psychiatrist office to schedule an appointment.  Please call [facility] to schedule your appointment to evaluate the larger polyp.    You may take [facility] for your enlarged prostate.  Your LDL is above normal.  The LDL is the "lousy" or bad cholesterol. Over time and in combination with inflammation and other factors, this contributes to plaque which in turn may lead to stroke and/or heart attack down the road.  Sometimes high LDL is primarily genetic, and people might be eating all the right foods but still have high numbers.  Other times, there is room for improvement in one's diet and eating healthier can bring this number down and potentially reduce one's risk of heart attack and/or stroke.  Your LDL level should be below 100. If you have diabetes or a possible heart problem, your LDL should be below 70.  Some strategies to focus on to help improve your LDL levels:  - Eat 20 to 30 grams of fiber every day.  - Eat Foods such as fruits and vegetables, whole grains, beans, peas, nuts, and seeds can help lower LDL. - Avoid Saturated fats - Dairy foods - such as butter, cream, ghee, regular-fat milk and cheese. Meat - such as fatty cuts of beef, pork and lamb, processed meats like salami, sausages and the skin on chicken. Lard., fatty snack foods, cakes, biscuits, pies and deep fried foods) - Avoid smoking

## 2018-04-04 NOTE — Progress Notes (Signed)
Name: [name]   MRN: [id number]    DOB: August 31, 1955   Date:04/04/2018       Progress Note  Subjective  Chief Complaint  Chief Complaint  Patient presents with  . Annual Exam    HPI  Patient presents for annual CPE.  USPSTF grade A and B recommendations:  Diet: Breakfast - oatmeal, grits; lunch & dinner - chicken sandwiches, burritos; does try to eat vegetables and some fruit (usually fruit cupts Exercise: Hasn't been exercising regularly, but went yesterday and lifted some weight in his apartment complex's weight room.  Has not been walking  Depression: Feeling "a lot better, better attitude, better felling about life" today.  He still needs to schedule with Psychiatry.  Would like to start going to church with his Yolanda Bonine & his family. Depression screen [facility] 2/9 04/04/2018 03/26/2018 03/14/2018  Decreased Interest 1 2 3   Down, Depressed, Hopeless 0 0 1  PHQ - 2 Score 1 2 4   Altered sleeping 0 0 0  Tired, decreased energy 1 1 2   Change in appetite 0 0 0  Feeling bad or failure about yourself  0 0 1  Trouble concentrating 1 1 2   Moving slowly or fidgety/restless 0 0 0  Suicidal thoughts 0 0 -  PHQ-9 Score 3 4 9   Difficult doing work/chores Not difficult at all Somewhat difficult Somewhat difficult   Hypertension: BP Readings from Last 3 Encounters:  04/04/18 106/72  03/26/18 118/82  03/22/18 126/81   Obesity: Wt Readings from Last 3 Encounters:  04/04/18 184 lb 12.8 oz (83.8 kg)  03/26/18 185 lb 1.6 oz (84 kg)  03/22/18 180 lb (81.6 kg)   BMI Readings from Last 3 Encounters:  04/04/18 24.38 kg/m  03/26/18 24.42 kg/m  03/22/18 25.10 kg/m    Lipids:  The 10-year ASCVD risk score Mikey Bussing DC Brooke Bonito., et al., 2013) is: 7%   Values used to calculate the score:     Age: 62 years     Sex: Male     Is Non-Hispanic African American: No     Diabetic: No     Tobacco smoker: No     Systolic Blood Pressure: 939 mmHg     Is BP treated: No     HDL Cholesterol: 52 mg/dL      Total Cholesterol: 176 mg/dL  Glucose:  Glucose, Bld  Date Value Ref Range Status  03/26/2018 80 65 - 99 mg/dL Final    Comment:    .            Fasting reference interval .   03/14/2018 85 65 - 99 mg/dL Final    Comment:    .            Fasting reference interval .   01/10/2018 132 (H) 70 - 99 mg/dL Final    Comment:    Please note change in reference range.   Glucose-Capillary  Date Value Ref Range Status  02/07/2018 92 70 - 99 mg/dL Final  02/05/2018 89 70 - 99 mg/dL Final  02/02/2018 83 70 - 99 mg/dL Final     [facility] Visit from 04/04/2018 in [facility]  AUDIT-C Score  0     Married - separated STD testing and prevention (HIV/chl/gon/syphilis): UTD Hep C: UTD  Skin cancer: No concerning lesions; dose not wear sunscreen - has not been going outside much lately. Colorectal cancer: Had done 03/22/2018, referred to [facility] for additional polyp management - needs to schedule Prostate  cancer: Recently had PSA done, normal level.  AUA score of 11 on 03/26/2018.  Does endorse ED for 2-3 years - has never taken medication for this.  He does not want to start taking medication for BPH - prefers herbal supplement - discussed saw pal[facility]to.  We will rx PRN cialis for ED Lab Results  Component Value Date   PSA 2.6 03/14/2018   ECG:  July 2019 EKG on file  Advanced Care Planning: A voluntary discussion about advance care planning including the explanation and discussion of advance directives.  Discussed health care proxy and Living will, and the patient was able to identify a health care proxy as (Grandson) - Anastasia Pall.  Patient does not have a living will at present time. If patient does have living will, I have requested they bring this to the clinic to be scanned in to their chart.  Patient Active Problem List   Diagnosis Date Noted  . Vitamin D deficiency 03/14/2018  . Weakness of back 03/14/2018  . Elevated CK 03/14/2018  . B12 deficiency 03/14/2018  .  High risk medications (not anticoagulants) long-term use 03/14/2018  . Onychomycosis 03/14/2018  . Catatonia 01/12/2018  . Malnutrition of moderate degree 01/09/2018  . Pressure injury of skin 01/09/2018  . Severe recurrent major depression without psychotic features ([place]) 01/09/2018  . Back pain 01/08/2018  . Shuffling gait 01/08/2018    Past Surgical History:  Procedure Laterality Date  . COLONOSCOPY WITH PROPOFOL N/A 03/22/2018   Procedure: COLONOSCOPY WITH PROPOFOL;  Surgeon: Jonathon Bellows, MD;  Location: [facility] ENDOSCOPY;  Service: Gastroenterology;  Laterality: N/A;  . HERNIA REPAIR     No family history on file.  Social History   Socioeconomic History  . Marital status: Married    Spouse name: Reitta  . Number of children: 1  . Years of education: Not on file  . Highest education level: Not on file  Occupational History  . Occupation: retired  Scientific laboratory technician  . Financial resource strain: Not hard at all  . Food insecurity:    Worry: Never true    Inability: Never true  . Transportation needs:    Medical: No    Non-medical: No  Tobacco Use  . Smoking status: Former Research scientist (life sciences)  . Smokeless tobacco: Former Systems developer    Quit date: 01/10/2017  Substance and Sexual Activity  . Alcohol use: Not Currently  . Drug use: Not Currently  . Sexual activity: Not Currently  Lifestyle  . Physical activity:    Days per week: 0 days    Minutes per session: 0 min  . Stress: To some extent  Relationships  . Social connections:    Talks on phone: Once a week    Gets together: Once a week    Attends religious service: Never    Active member of club or organization: No    Attends meetings of clubs or organizations: Never    Relationship status: Married  . Intimate partner violence:    Fear of current or ex partner: No    Emotionally abused: No    Physically abused: No    Forced sexual activity: No  Other Topics Concern  . Not on file  Social History Narrative  . Not on file      Current Outpatient Medications:  .  mirtazapine (REMERON) 45 MG tablet, Take 1 tablet (45 mg total) by mouth at bedtime., Disp: 30 tablet, Rfl: 2 .  terbinafine (LAMISIL) 250 MG tablet, Take 1 tablet (250 mg  total) by mouth daily., Disp: 30 tablet, Rfl: 0 .  traZODone (DESYREL) 100 MG tablet, Take 1 tablet (100 mg total) by mouth at bedtime as needed for sleep., Disp: 30 tablet, Rfl: 2  No Known Allergies   ROS Constitutional: Negative for fever or weight change.  Respiratory: Negative for cough and shortness of breath.   Cardiovascular: Negative for chest pain or palpitations.  Gastrointestinal: Negative for abdominal pain, no bowel changes.  Musculoskeletal: Negative for gait problem or joint swelling.  Skin: Negative for rash.  Neurological: Negative for dizziness or headache.  No other specific complaints in a complete review of systems (except as listed in HPI above).  Objective  Vitals:   04/04/18 1010  BP: 106/72  Pulse: 81  Resp: 16  Temp: 98.3 F (36.8 C)  TempSrc: Oral  SpO2: 94%  Weight: 184 lb 12.8 oz (83.8 kg)  Height: 6\' 1"  (1.854 m)    Body mass index is 24.38 kg/m.  Physical Exam Constitutional: Patient appears well-developed and well-nourished. No distress.  HENT: Head: Normocephalic and atraumatic. Ears: B TMs ok, no erythema or effusion; Nose: Nose normal. Mouth/Throat: Oropharynx is clear and moist. No oropharyngeal exudate.  Eyes: Conjunctivae and EOM are normal. Pupils are equal, round, and reactive to light. No scleral icterus.  Neck: Normal range of motion. Neck supple. No JVD present. No thyromegaly present.  Cardiovascular: Normal rate, regular rhythm and normal heart sounds.  No murmur heard. No BLE edema. Pulmonary/Chest: Effort normal and breath sounds normal. No respiratory distress. Abdominal: Soft. Bowel sounds are normal, no distension. There is no tenderness. no masses RECTAL: Prostate moderately enlarged, but normal consistency,  no rectal masses. Small external non-thrombosed external hemorrhoid. Musculoskeletal: Normal range of motion, no joint effusions. No gross deformities Neurological: he is alert and oriented to person, place, and time. No cranial nerve deficit. Coordination, balance, strength, speech and gait are normal.  Skin: Skin is warm and dry. No rash noted. No erythema. toenails are thickened and discolored, however there is about 1-35mm area at the base of the nails where there is clearing of the onychomycosis.  Psychiatric: Patient has a normal mood and affect. behavior is normal. Judgment and thought content normal.  Recent labs performed at [facility] in August 2019 are reviewed.  PHQ2/9: Depression screen [facility] 2/9 04/04/2018 03/26/2018 03/14/2018  Decreased Interest 1 2 3   Down, Depressed, Hopeless 0 0 1  PHQ - 2 Score 1 2 4   Altered sleeping 0 0 0  Tired, decreased energy 1 1 2   Change in appetite 0 0 0  Feeling bad or failure about yourself  0 0 1  Trouble concentrating 1 1 2   Moving slowly or fidgety/restless 0 0 0  Suicidal thoughts 0 0 -  PHQ-9 Score 3 4 9   Difficult doing work/chores Not difficult at all Somewhat difficult Somewhat difficult    Fall Risk: Fall Risk  04/04/2018 03/26/2018 03/14/2018  Falls in the past year? Yes - Yes  Number falls in past yr: 2 or more 2 or more 2 or more  Injury with Fall? No No No  Risk Factor Category  High Fall Risk High Fall Risk High Fall Risk   Assessment & Plan  1. Annual physical exam -Prostate cancer screening and PSA options (with potential risks and benefits of testing vs not testing) were discussed along with recent recs/guidelines. -USPSTF grade A and B recommendations reviewed with patient; age-appropriate recommendations, preventive care, screening tests, etc discussed and encouraged; healthy living  encouraged; see AVS for patient education given to patient -Discussed importance of 150 minutes of physical activity weekly, eat  two servings of fish weekly, eat one serving of tree nuts ( cashews, pistachios, pecans, almonds.[name]) every other day, eat 6 servings of fruit/vegetables daily and drink plenty of water and avoid sweet beverages.   2. Erectile dysfunction, unspecified erectile dysfunction type - tadalafil (CIALIS) 10 MG tablet; Take 0.5-1 tablets (5-10 mg total) by mouth daily as needed for erectile dysfunction.  Dispense: 10 tablet; Refill: 1  3. Benign prostatic hyperplasia with urinary frequency - tadalafil (CIALIS) 10 MG tablet; Take 0.5-1 tablets (5-10 mg total) by mouth daily as needed for erectile dysfunction.  Dispense: 10 tablet; Refill: 1  4. Onychomycosis Improving.  Finish Lamisil, will monitor LFT's.   Follow up in December as scheduled.  Keep Psychiatry referral.

## 2018-04-10 NOTE — Telephone Encounter (Signed)
Melissa at referral office is working on referral

## 2018-04-13 ENCOUNTER — Ambulatory Visit: Payer: Self-pay | Admitting: Family Medicine

## 2018-05-21 ENCOUNTER — Telehealth: Payer: Self-pay

## 2018-05-21 NOTE — Telephone Encounter (Signed)
-----   Message from Jonathon Bellows, MD sent at 05/13/2018 11:49 AM EST ----- Essie Lagunes-do you know what happened to the referred for [facility] advanced endoscopy for resection of large flat polyp in ascending colon   C/c Hubbard Hartshorn, FNP

## 2018-05-21 NOTE — Telephone Encounter (Signed)
Called pt regarding his referral to [facility] for resection of colon polyp. Unable to contact, LVM to return call I contacted [facility] regarding pt referral. [facility] states they have tried contacting pt to schedule but have been unsuccessful. Pt will need to contact [facility] to schedule his appointment.

## 2018-05-28 ENCOUNTER — Other Ambulatory Visit: Payer: Self-pay | Admitting: Psychiatry

## 2018-06-04 ENCOUNTER — Telehealth: Payer: Self-pay | Admitting: Family Medicine

## 2018-06-04 NOTE — Telephone Encounter (Signed)
Left message for patient to call office or call Dr. Vicente Males office

## 2018-06-04 NOTE — Telephone Encounter (Addendum)
Bonnita Nasuti - please try to get into contact with Mr. Meditz asap to have him referred for resection of large polyp in the colon. Dr. Vicente Males has been trying to reach him and has not been able to do so.  He may answer/call back if you call.

## 2018-06-05 NOTE — Telephone Encounter (Signed)
Called pt, and pt emergency contacts regarding pt referral to [facility]. Unable to reach pt via phone. We will send a letter via certified mail.

## 2018-06-13 ENCOUNTER — Ambulatory Visit: Payer: Self-pay | Admitting: Family Medicine

## 2018-06-15 ENCOUNTER — Telehealth: Payer: Self-pay

## 2018-06-15 NOTE — Telephone Encounter (Signed)
-----   Message from Jonathon Bellows, MD sent at 06/04/2018 10:32 AM EST ----- Sherald Hess - if you are not able to get in touch with the patient then set a letter by certified mail that he needs to call our office or PCP asap to have him referred for resection of large polyp in the colon   C/c Hubbard Hartshorn, FNP

## 2018-06-15 NOTE — Telephone Encounter (Signed)
I have again attempted to contact pt and pt emergency contacts via phone to no avail. LVM to return call Letter was sent via certified mail on [phone]. Pt has not contacted our office.

## 2018-11-08 ENCOUNTER — Other Ambulatory Visit: Payer: Self-pay

## 2018-11-08 ENCOUNTER — Ambulatory Visit (INDEPENDENT_AMBULATORY_CARE_PROVIDER_SITE_OTHER): Payer: Self-pay | Admitting: Family Medicine

## 2018-11-08 DIAGNOSIS — Z5329 Procedure and treatment not carried out because of patient's decision for other reasons: Secondary | ICD-10-CM

## 2018-11-08 DIAGNOSIS — Z91199 Patient's noncompliance with other medical treatment and regimen due to unspecified reason: Secondary | ICD-10-CM

## 2018-11-08 NOTE — Progress Notes (Signed)
No show - multiple telephone calls placed by CMA with no answer.

## 2018-11-09 ENCOUNTER — Ambulatory Visit: Payer: Self-pay | Admitting: Family Medicine

## 2018-11-12 ENCOUNTER — Other Ambulatory Visit: Payer: Self-pay | Admitting: Family Medicine

## 2018-11-12 DIAGNOSIS — F332 Major depressive disorder, recurrent severe without psychotic features: Secondary | ICD-10-CM

## 2018-11-12 DIAGNOSIS — Z9889 Other specified postprocedural states: Secondary | ICD-10-CM

## 2018-11-12 NOTE — Progress Notes (Signed)
Spoke with Patient's Son Anastasia Pall after patient no-showed for appointment last week; he will bring him in to the office this week for in-person assessment, he is concerned that he is relapsing with his severe depression.  Advised of [facility] and ER for urgent matters, will also re-refer urgently to psychiatry.

## 2018-11-15 ENCOUNTER — Ambulatory Visit: Payer: Self-pay | Admitting: Family Medicine

## 2018-11-19 ENCOUNTER — Ambulatory Visit: Payer: Self-pay | Admitting: Family Medicine

## 2018-11-23 ENCOUNTER — Ambulatory Visit: Payer: Self-pay | Admitting: Family Medicine

## 2018-12-18 ENCOUNTER — Encounter: Payer: Self-pay | Admitting: *Deleted

## 2018-12-18 ENCOUNTER — Other Ambulatory Visit: Payer: Self-pay

## 2018-12-18 ENCOUNTER — Emergency Department
Admission: EM | Admit: 2018-12-18 | Discharge: 2018-12-20 | Disposition: A | Discharge status: Other Acute Inpatient Hospital | Payer: BLUE CROSS/BLUE SHIELD | Attending: Student in an Organized Health Care Education/Training Program | Admitting: Student in an Organized Health Care Education/Training Program

## 2018-12-18 DIAGNOSIS — F329 Major depressive disorder, single episode, unspecified: Secondary | ICD-10-CM | POA: Diagnosis not present

## 2018-12-18 DIAGNOSIS — Z87891 Personal history of nicotine dependence: Secondary | ICD-10-CM | POA: Insufficient documentation

## 2018-12-18 DIAGNOSIS — F32A Depression, unspecified: Secondary | ICD-10-CM

## 2018-12-18 DIAGNOSIS — F332 Major depressive disorder, recurrent severe without psychotic features: Secondary | ICD-10-CM

## 2018-12-18 DIAGNOSIS — Z20828 Contact with and (suspected) exposure to other viral communicable diseases: Secondary | ICD-10-CM | POA: Diagnosis not present

## 2018-12-18 DIAGNOSIS — Z79899 Other long term (current) drug therapy: Secondary | ICD-10-CM | POA: Diagnosis present

## 2018-12-18 HISTORY — DX: Disorder of prostate, unspecified: N42.9

## 2018-12-18 LAB — CBC
HCT: 40.1 % (ref 39.0–52.0)
Hemoglobin: 13.1 g/dL (ref 13.0–17.0)
MCH: 30.4 pg (ref 26.0–34.0)
MCHC: 32.7 g/dL (ref 30.0–36.0)
MCV: 93 fL (ref 80.0–100.0)
Platelets: 199 10*3/uL (ref 150–400)
RBC: 4.31 MIL/uL (ref 4.22–5.81)
RDW: 12.3 % (ref 11.5–15.5)
WBC: 4.9 10*3/uL (ref 4.0–10.5)
nRBC: 0 % (ref 0.0–0.2)

## 2018-12-18 LAB — COMPREHENSIVE METABOLIC PANEL
ALT: 10 U/L (ref 0–44)
AST: 15 U/L (ref 15–41)
Albumin: 4 g/dL (ref 3.5–5.0)
Alkaline Phosphatase: 65 U/L (ref 38–126)
Anion gap: 7 (ref 5–15)
BUN: 23 mg/dL (ref 8–23)
CO2: 27 mmol/L (ref 22–32)
Calcium: 8.9 mg/dL (ref 8.9–10.3)
Chloride: 106 mmol/L (ref 98–111)
Creatinine, Ser: 1 mg/dL (ref 0.61–1.24)
GFR calc Af Amer: >60 mL/min (ref >60)
GFR calc non Af Amer: >60 mL/min (ref >60)
Glucose, Bld: 89 mg/dL (ref 70–99)
Potassium: 3.9 mmol/L (ref 3.5–5.1)
Sodium: 140 mmol/L (ref 135–145)
Total Bilirubin: 1.3 mg/dL — ABNORMAL HIGH (ref 0.3–1.2)
Total Protein: 7.5 g/dL (ref 6.5–8.1)

## 2018-12-18 LAB — URINE DRUG SCREEN, QUALITATIVE (ARMC ONLY)
Amphetamines, Ur Screen: NOT DETECTED
Barbiturates, Ur Screen: NOT DETECTED
Benzodiazepine, Ur Scrn: NOT DETECTED
Cannabinoid 50 Ng, Ur ~~LOC~~: NOT DETECTED
Cocaine Metabolite,Ur ~~LOC~~: NOT DETECTED
MDMA (Ecstasy)Ur Screen: NOT DETECTED
Methadone Scn, Ur: NOT DETECTED
Opiate, Ur Screen: NOT DETECTED
Phencyclidine (PCP) Ur S: NOT DETECTED
Tricyclic, Ur Screen: NOT DETECTED

## 2018-12-18 LAB — SALICYLATE LEVEL: Salicylate Lvl: <7 mg/dL (ref 2.8–30.0)

## 2018-12-18 LAB — ACETAMINOPHEN LEVEL: Acetaminophen (Tylenol), Serum: <10 ug/mL — ABNORMAL LOW (ref 10–30)

## 2018-12-18 LAB — ETHANOL: Alcohol, Ethyl (B): <10 mg/dL (ref <10)

## 2018-12-18 NOTE — ED Notes (Signed)
Patient states he is unable to void at this time. Patient states he needs to stand to void, but feels like he cannot bear his weight. Patient encouraged to use call bell when he feels like he can void. Patient will need a two-person assist to stand for safety.

## 2018-12-18 NOTE — ED Provider Notes (Signed)
[facility] Emergency Department Provider Note    First MD Initiated Contact with Patient 12/18/18 1724     (approximate)  I have reviewed the triage vital signs and the nursing notes.   HISTORY  Chief Complaint Depression    HPI JAHKING LESSER is a 63 y.o. male with a history of severe depression previously requiring admissions to hospital for ECT not currently on any antidepressive medication presents the ER via EMS and request of grandson who is noticed severe deterioration in the patient over the past few weeks.  Patient has not left his house.  Becoming increasingly isolated not eating.  Concern for severe debilitating depression.  Patient denies any SI.  Yolanda Bonine states that this is similar to previous time that he was admitted to the hospital was subsequently discharged to much better shape.    Past Medical History:  Diagnosis Date  . Depression   . Prostate disease    No family history on file. Past Surgical History:  Procedure Laterality Date  . COLONOSCOPY WITH PROPOFOL N/A 03/22/2018   Procedure: COLONOSCOPY WITH PROPOFOL;  Surgeon: Jonathon Bellows, MD;  [facility] ENDOSCOPY;  Service: Gastroenterology;  Laterality: N/A;  . HERNIA REPAIR     Patient Active Problem List   Diagnosis Date Noted  . Benign prostatic hyperplasia with urinary frequency 04/04/2018  . Erectile dysfunction 04/04/2018  . Vitamin D deficiency 03/14/2018  . Weakness of back 03/14/2018  . Elevated CK 03/14/2018  . B12 deficiency 03/14/2018  . High risk medications (not anticoagulants) long-term use 03/14/2018  . Onychomycosis 03/14/2018  . Catatonia 01/12/2018  . Malnutrition of moderate degree 01/09/2018  . Pressure injury of skin 01/09/2018  . Severe recurrent major depression without psychotic features ([place]) 01/09/2018  . Back pain 01/08/2018  . Shuffling gait 01/08/2018      Prior to Admission medications   Medication Sig Start Date End Date Taking?  Authorizing Provider  mirtazapine (REMERON) 45 MG tablet TAKE 1 TABLET BY MOUTH AT BEDTIME 05/28/18   Clapacs, Madie Reno, MD  tadalafil (CIALIS) 10 MG tablet Take 0.5-1 tablets (5-10 mg total) by mouth daily as needed for erectile dysfunction. 04/04/18   Hubbard Hartshorn, FNP  traZODone (DESYREL) 100 MG tablet Take 1 tablet (100 mg total) by mouth at bedtime as needed for sleep. 02/08/18   Clapacs, Madie Reno, MD    Allergies Patient has no known allergies.    Social History Social History   Tobacco Use  . Smoking status: Former Research scientist (life sciences)  . Smokeless tobacco: Former Systems developer    Quit date: 01/10/2017  Substance Use Topics  . Alcohol use: Not Currently  . Drug use: Not Currently    Review of Systems Patient denies headaches, rhinorrhea, blurry vision, numbness, shortness of breath, chest pain, edema, cough, abdominal pain, nausea, vomiting, diarrhea, dysuria, fevers, rashes or hallucinations unless otherwise stated above in HPI. ____________________________________________   PHYSICAL EXAM:  VITAL SIGNS: Vitals:   12/18/18 1716  BP: 138/85  Pulse: 81  Resp: 16  Temp: 98.2 F (36.8 C)  SpO2: 100%    Constitutional: Alert and oriented.  Eyes: Conjunctivae are normal.  Head: Atraumatic. Nose: No congestion/rhinnorhea. Mouth/Throat: Mucous membranes are moist.   Neck: No stridor. Painless ROM.  Cardiovascular: Normal rate, regular rhythm. Grossly normal heart sounds.  Good peripheral circulation. Respiratory: Normal respiratory effort.  No retractions. Lungs CTAB. Gastrointestinal: Soft and nontender. No distention. No abdominal bruits. No CVA tenderness. Genitourinary:  Musculoskeletal: No lower extremity tenderness nor  edema.  No joint effusions. Neurologic:  Normal speech and language. No gross focal neurologic deficits are appreciated. No facial droop Skin:  Skin is warm, dry and intact. No rash noted. Psychiatric: Mood and affect are depressed and withdrawn   ____________________________________________   LABS (all labs ordered are listed, but only abnormal results are displayed)  Results for orders placed or performed during the hospital encounter of 12/18/18 (from the past 24 hour(s))  Comprehensive metabolic panel     Status: Abnormal   Collection Time: 12/18/18  5:37 PM  Result Value Ref Range   Sodium 140 135 - 145 mmol/L   Potassium 3.9 3.5 - 5.1 mmol/L   Chloride 106 98 - 111 mmol/L   CO2 27 22 - 32 mmol/L   Glucose, Bld 89 70 - 99 mg/dL   BUN 23 8 - 23 mg/dL   Creatinine, Ser 1.00 0.61 - 1.24 mg/dL   Calcium 8.9 8.9 - 10.3 mg/dL   Total Protein 7.5 6.5 - 8.1 g/dL   Albumin 4.0 3.5 - 5.0 g/dL   AST 15 15 - 41 U/L   ALT 10 0 - 44 U/L   Alkaline Phosphatase 65 38 - 126 U/L   Total Bilirubin 1.3 (H) 0.3 - 1.2 mg/dL   GFR calc non Af Amer >60 >60 mL/min   GFR calc Af Amer >60 >60 mL/min   Anion gap 7 5 - 15  Ethanol     Status: None   Collection Time: 12/18/18  5:37 PM  Result Value Ref Range   Alcohol, Ethyl (B) <16 <10 mg/dL  Salicylate level     Status: None   Collection Time: 12/18/18  5:37 PM  Result Value Ref Range   Salicylate Lvl <9.6 2.8 - 30.0 mg/dL  Acetaminophen level     Status: Abnormal   Collection Time: 12/18/18  5:37 PM  Result Value Ref Range   Acetaminophen (Tylenol), Serum <10 (L) 10 - 30 ug/mL  cbc     Status: None   Collection Time: 12/18/18  5:37 PM  Result Value Ref Range   WBC 4.9 4.0 - 10.5 K/uL   RBC 4.31 4.22 - 5.81 MIL/uL   Hemoglobin 13.1 13.0 - 17.0 g/dL   HCT 40.1 39.0 - 52.0 %   MCV 93.0 80.0 - 100.0 fL   MCH 30.4 26.0 - 34.0 pg   MCHC 32.7 30.0 - 36.0 g/dL   RDW 12.3 11.5 - 15.5 %   Platelets 199 150 - 400 K/uL   nRBC 0.0 0.0 - 0.2 %   ____________________________________________  EKG My review and personal interpretation at Time: 18:53   Indication: med screening  Rate: 70  Rhythm: sinus Axis: norma Other: no stemi, normal intervals  ____________________________________________  RADIOLOGY   ____________________________________________   PROCEDURES  Procedure(s) performed:  Procedures    Critical Care performed: no ____________________________________________   INITIAL IMPRESSION / ASSESSMENT AND PLAN / ED COURSE  Pertinent labs & imaging results that were available during my care of the patient were reviewed by me and considered in my medical decision making (see chart for details).   DDX: Psychosis, delirium, medication effect, noncompliance, polysubstance abuse, Si, Hi, depression   VITALIY EISENHOUR is a 63 y.o. who presents to the ED with for evaluation of depression.  Patient has psych history of depression.  Laboratory testing was ordered to evaluation for underlying electrolyte derangement or signs of underlying organic pathology to explain today's presentation.  Based on history and physical and laboratory  evaluation, it appears that the patient's presentation is 2/2 underlying psychiatric disorder and will require further evaluation and management by inpatient psychiatry.   Disposition pending psychiatric evaluation.      The patient was evaluated in Emergency Department today for the symptoms described in the history of present illness. He/she was evaluated in the context of the global COVID-19 pandemic, which necessitated consideration that the patient might be at risk for infection with the SARS-CoV-2 virus that causes COVID-19. Institutional protocols and algorithms that pertain to the evaluation of patients at risk for COVID-19 are in a state of rapid change based on information released by regulatory bodies including the CDC and federal and state organizations. These policies and algorithms were followed during the patient's care in the ED.  As part of my medical decision making, I reviewed the following data within the [facility] notes reviewed and incorporated, Labs  reviewed, notes from prior ED visits and [place] Controlled Substance Database   ____________________________________________   FINAL CLINICAL IMPRESSION(S) / ED DIAGNOSES  Final diagnoses:  Depression, unspecified depression type      NEW MEDICATIONS STARTED DURING THIS VISIT:  New Prescriptions   No medications on file     Note:  This document was prepared using Dragon voice recognition software and may include unintentional dictation errors.    Merlyn Lot, MD 12/18/18 Joen Laura

## 2018-12-18 NOTE — ED Notes (Signed)
Urinal at bedside with instructions to use call bell.

## 2018-12-18 NOTE — BH Assessment (Addendum)
TTS is available for tele-assessment at anytime. Phone number to machine is [phone]

## 2018-12-18 NOTE — ED Triage Notes (Addendum)
Per EMS report, patient's grandson whom the patient lives with, states being at home and indoors due to Covid pandemic has increased the patient's depression. Patient has also been off his depression meds for several months. Grandson told EMS that patient has decreased appetite and increased weakness. Patient has bilateral pedal edema and a sacral decub. Patient states it's been a month since he had a shower because he was worried about falling.

## 2018-12-18 NOTE — ED Notes (Signed)
Report given to Tom RN.

## 2018-12-19 ENCOUNTER — Emergency Department: Payer: BLUE CROSS/BLUE SHIELD

## 2018-12-19 DIAGNOSIS — F333 Major depressive disorder, recurrent, severe with psychotic symptoms: Secondary | ICD-10-CM

## 2018-12-19 LAB — URINALYSIS, COMPLETE (UACMP) WITH MICROSCOPIC
Bacteria, UA: NONE SEEN
Bilirubin Urine: NEGATIVE
Glucose, UA: NEGATIVE mg/dL
Hgb urine dipstick: NEGATIVE
Ketones, ur: NEGATIVE mg/dL
Leukocytes,Ua: NEGATIVE
Nitrite: NEGATIVE
Protein, ur: NEGATIVE mg/dL
Specific Gravity, Urine: 1.027 (ref 1.005–1.030)
Squamous Epithelial / LPF: NONE SEEN (ref 0–5)
pH: 5 (ref 5.0–8.0)

## 2018-12-19 LAB — SARS CORONAVIRUS 2 BY RT PCR (HOSPITAL ORDER, PERFORMED IN ~~LOC~~ HOSPITAL LAB): SARS Coronavirus 2: NEGATIVE

## 2018-12-19 MED ORDER — TRAZODONE HCL 100 MG PO TABS
100.0000 mg | ORAL_TABLET | Freq: Every day | ORAL | Status: DC
  Administered 2018-12-19: 100 mg via ORAL
  Filled 2018-12-19: qty 1

## 2018-12-19 MED ORDER — MIRTAZAPINE 15 MG PO TABS
45.0000 mg | ORAL_TABLET | Freq: Every day | ORAL | Status: DC
  Administered 2018-12-19: 45 mg via ORAL
  Filled 2018-12-19: qty 3

## 2018-12-19 NOTE — ED Notes (Signed)
Psychiatrist in room at this time.

## 2018-12-19 NOTE — ED Notes (Signed)
Offered pt his meal tray again and he refused.

## 2018-12-19 NOTE — ED Notes (Signed)
Patient handed his breakfast.  Patient states he is unable to move and unable to bring biscuit to his mouth.  Patient encouraged to try and told if he is unable staff will assist but he needs to try himself first.  Will continue to monitor.

## 2018-12-19 NOTE — BH Assessment (Signed)
Assessment Note  [name] is an 64 y.o. male who presents to the ED via EMS with his grandson that he lives in the home with. Pt comes in with complaints of worsening depression due to isolation from being quarantined for so long in the home. Pt has been off of his medications for some time adding to the depressed state. Per pt's grandson, the pt just lays on the couch all day and doesn't have the will or energy to get up and take a shower of even cook for himself. His grandson is concerned because he spends 13-14 hours at home alone while his grandson works. Grandson reports pt has not showered in over 60 days.   The pt was last admitted to the [facility] in 2019 and was receiving ECT treatments with DR. Clapacs. The family reports that the pt responded very well to those treatments but was not given strong out patent services. Pt endorses SI but denies  HI A/V H/D  Diagnosis: Major Depressive Disorder  Past Medical History:  Past Medical History:  Diagnosis Date  . Depression   . Prostate disease     Past Surgical History:  Procedure Laterality Date  . COLONOSCOPY WITH PROPOFOL N/A 03/22/2018   Procedure: COLONOSCOPY WITH PROPOFOL;  Surgeon: Jonathon Bellows, MD;  [facility] ENDOSCOPY;  Service: Gastroenterology;  Laterality: N/A;  . HERNIA REPAIR      Family History: No family history on file.  Social History:  reports that he has quit smoking. He quit smokeless tobacco use about 23 months ago. He reports previous alcohol use. He reports previous drug use.  Additional Social History:  Alcohol / Drug Use Pain Medications: SEE MAR Prescriptions: SEE MAR Over the Counter: SEE MAR History of alcohol / drug use?: No history of alcohol / drug abuse  CIWA: CIWA-Ar BP: 134/82 Pulse Rate: 75 COWS:    Allergies: No Known Allergies  Home Medications: (Not in a hospital admission)   OB/GYN Status:  No LMP for male patient.  General Assessment Data Location of Assessment: [facility]  ED TTS Assessment: In system Is this a Tele or Face-to-Face Assessment?: Face-to-Face Is this an Initial Assessment or a Re-assessment for this encounter?: Initial Assessment Patient Accompanied by:: N/A Language Other than English: No Living Arrangements: Other (Comment) What gender do you identify as?: Male Marital status: Married Living Arrangements: Children Can pt return to current living arrangement?: Yes Admission Status: Voluntary Is patient capable of signing voluntary admission?: Yes Referral Source: Self/Family/Friend Insurance type: bcbs  Medical Screening Exam ([place]) Medical Exam completed: Yes  Crisis Care Plan Living Arrangements: Children Legal Guardian: Other:(Self) Name of Psychiatrist: n/a Name of Therapist: n/a  Education Status Is patient currently in school?: No Is the patient employed, unemployed or receiving disability?: Receiving disability income(Retires)  Risk to self with the past 6 months Suicidal Ideation: Yes-Currently Present Has patient been a risk to self within the past 6 months prior to admission? : No Suicidal Intent: No Has patient had any suicidal intent within the past 6 months prior to admission? : No Is patient at risk for suicide?: No, but patient needs Medical Clearance Suicidal Plan?: No Has patient had any suicidal plan within the past 6 months prior to admission? : No Access to Means: No What has been your use of drugs/alcohol within the last 12 months?: denies Previous Attempts/Gestures: No How many times?: 0 Other Self Harm Risks: n/a Triggers for Past Attempts: None known Intentional Self Injurious Behavior: None  Family Suicide History: No Recent stressful life event(s): Turmoil (Comment) Persecutory voices/beliefs?: No Depression: Yes Depression Symptoms: Fatigue, Guilt, Loss of interest in usual pleasures, Feeling worthless/self pity, Feeling angry/irritable Substance abuse history and/or treatment for  substance abuse?: No Suicide prevention information given to non-admitted patients: Not applicable  Risk to Others within the past 6 months Homicidal Ideation: No Does patient have any lifetime risk of violence toward others beyond the six months prior to admission? : No Thoughts of Harm to Others: No Current Homicidal Intent: No Current Homicidal Plan: No Access to Homicidal Means: No Assessment of Violence: None Noted Does patient have access to weapons?: No Criminal Charges Pending?: No Does patient have a court date: No Is patient on probation?: No  Psychosis Hallucinations: None noted Delusions: None noted  Mental Status Report Appearance/Hygiene: Unremarkable Eye Contact: Fair Motor Activity: Freedom of movement Speech: Slow Level of Consciousness: Alert Mood: Depressed, Anxious Affect: Blunted, Flat, Constricted Anxiety Level: Moderate Thought Processes: Unable to Assess Judgement: Impaired Orientation: Person, Place, Time Obsessive Compulsive Thoughts/Behaviors: Minimal  Cognitive Functioning Concentration: Fair Memory: Recent Intact, Remote Intact Is patient IDD: No Insight: Fair Impulse Control: Fair Appetite: Fair Have you had any weight changes? : Loss Sleep: Decreased  ADLScreening [facility] Assessment Services) Patient's cognitive ability adequate to safely complete daily activities?: Yes Patient able to express need for assistance with ADLs?: Yes Independently performs ADLs?: Yes (appropriate for developmental age)  Prior Inpatient Therapy Prior Inpatient Therapy: Yes Prior Therapy Dates: 2019 Prior Therapy Facilty/Provider(s): [facility] Reason for Treatment: Depression  Prior Outpatient Therapy Prior Outpatient Therapy: No Does patient have an ACCT team?: No Does patient have Intensive In-House Services?  : No Does patient have [facility] services? : No Does patient have P4CC services?: No  ADL Screening (condition at time of admission) Patient's  cognitive ability adequate to safely complete daily activities?: Yes Is the patient deaf or have difficulty hearing?: No Does the patient have difficulty seeing, even when wearing glasses/contacts?: No Does the patient have difficulty concentrating, remembering, or making decisions?: No Patient able to express need for assistance with ADLs?: Yes Does the patient have difficulty dressing or bathing?: No Independently performs ADLs?: Yes (appropriate for developmental age) Does the patient have difficulty walking or climbing stairs?: No Weakness of Legs: None Weakness of Arms/Hands: None  Home Assistive Devices/Equipment Home Assistive Devices/Equipment: Eyeglasses  Therapy Consults (therapy consults require a physician order) PT Evaluation Needed: No OT Evalulation Needed: No SLP Evaluation Needed: No Abuse/Neglect Assessment (Assessment to be complete while patient is alone) Abuse/Neglect Assessment Can Be Completed: Yes Physical Abuse: Denies Verbal Abuse: Denies Sexual Abuse: Denies Exploitation of patient/patient's resources: Denies Self-Neglect: Denies Values / Beliefs Cultural Requests During Hospitalization: None Spiritual Requests During Hospitalization: None Consults Spiritual Care Consult Needed: No Social Work Consult Needed: No Regulatory affairs officer (For Healthcare) Does Patient Have a Medical Advance Directive?: No Would patient like information on creating a medical advance directive?: No - Patient declined          Disposition:  Disposition Initial Assessment Completed for this Encounter: Yes Disposition of Patient: Admit Type of inpatient treatment program: Adult Patient refused recommended treatment: No Mode of transportation if patient is discharged/movement?: Car Patient referred to: [facility] ECT)  On Site Evaluation by:   Reviewed with Physician:    Lorelie Biermann D Azilee Pirro 12/19/2018 4:11 AM

## 2018-12-19 NOTE — ED Notes (Signed)
Pt observed resting in room, eyes closed and regular/even/unlabored respirations. Will continue to monitor needs/safety.

## 2018-12-19 NOTE — ED Notes (Signed)
Pt asleep at this time

## 2018-12-19 NOTE — ED Notes (Signed)
Pt c/o "back hurting and prostate hurting." Anna,RN notified about this and pt was repositioned on right side with pillow underneath his back. Pt urged to use call light with any further needs.

## 2018-12-19 NOTE — ED Notes (Signed)
Pt states he is not comfortable. Pt repositioned by this tech and Artist Pais., RN. No other needs voiced at this time.

## 2018-12-19 NOTE — BH Assessment (Signed)
Referral information for Psychiatric Hospitalization faxed to;   [name] Cristal Ford [phone])     [facility] [phone] -or- [phone]) [phone]  . [place] [phone]),  . [facility] [phone]),   . [place] [phone]),   . [place] [phone])  . [place] [phone]),   . [address] [phone]),   . [facility] [phone] or [phone])  . [place] [phone] or [phone]),   . [facility] [phone])

## 2018-12-19 NOTE — ED Notes (Signed)
Pt given a bed bath and bed linen changed. Pt has no further needs at this time. Instructed to use call light for any needs. Pt in bed eating breakfast.

## 2018-12-19 NOTE — ED Notes (Signed)
Pt was offered meal tray but refused.

## 2018-12-19 NOTE — Consult Note (Signed)
[place] [facility] Consult   Reason for Consult: Depression Referring Physician: Dr. Quentin Cornwall Patient Identification: [name]:  [id number] Principal Diagnosis: MDD (major depressive disorder), recurrent episode, severe ([place]) Diagnosis:  Principal Problem:   MDD (major depressive disorder), recurrent episode, severe ([place])  Total Time spent with patient: 25 minutes   Patient is seen, chart is reviewed, patient continues to present with severe depression and flat affect.  Patient states that he is irritable bowel syndrome, and has been having difficulty controlling his bowel movements and his urine.  He describes that he is humiliated, and has regular thoughts that he does not want to be alive.  He currently is living with his grandson [name], who is 52 years old and he believes himself to be a burden to his grandson.  Patient denies having access to a gun.  He does have a history of receiving ECT for severe depression in the past, from which he responded well to.  Patient continues to meet criteria for inpatient psychiatric admission. Patient has been accepted at a geriatric psychiatric inpatient unit.  EKG, chest x-ray, UA and routine labs have been ordered and are attached to the chart.  COVID screening is completed and negative.  Per initial psychiatric intake by NP: Subjective: "My back hurt really bad."  "I am in a bad shape." [name] is a 64 y.o. male patient presented to [facility] ED via EMS.  It was discussed that the patient grandson had contacted EMS due to the patient increased depressive state.  The patient grandson states that due to COVID 19 virus the patient has remain at home and indoors and has gotten progressively worse.  He voiced the patient has been off his mirtazapine 45 MG tablet at bedtime and trazodone 100 mg at bedtime.  The patient grandson states the patient has been experiencing decreased appetite and increased weakness. The patient was seen  face-to-face by this provider; chart reviewed and consulted with Dr.Brown on 12/19/2018 due to the care of the patient. It was discussed with the provider that the patient does meet criteria to be admitted to the inpatient unit.  It was discussed by Dr. Owens Shark and this provider that the patient was an ECT patient of Dr. Weber Cooks.  Per the patient grandson who stated the treatment was effective and currently inquiring about the patient receiving ECT during this admission. On evaluation the patient  is alert and oriented x4, calm and cooperative, with depressed mood.  The patient does not appear to be responding to internal or external stimuli. Neither is the patient presenting with any delusional thinking. The patient denies auditory or visual hallucinations. The patient admits to sometimes having suicidal thoughts but denies, homicidal ideations. The patient is not presenting with any psychotic or paranoid behaviors. During an encounter with the patient, he was able to answer questions appropriately. Collateral was obtained from the patient grandson and TTS counselor Ms. Hester received  the information from the patient's grandson. Collateral information was obtained from brother in regards to safety planning Plan: The patient is a safety risk to self does require psychiatric inpatient admission for stabilization and treatment. HPI: Per Dr. Quentin Cornwall; [name] is a 63 y.o. male with a history of severe depression previously requiring admissions to hospital for ECT not currently on any antidepressive medication presents the ER via EMS and request of grandson who is noticed severe deterioration in the patient over the past few weeks.  Patient has not left his house.  Becoming increasingly isolated not eating.  Concern for severe debilitating depression.  Patient denies any SI.    The patient that this is similar to previous time that he was admitted to the hospital was subsequently discharged to much better  shape.  Past Psychiatric History: Depression  Risk to Self: Suicidal Ideation: Yes-Currently Present Suicidal Intent: No Is patient at risk for suicide?: No, but patient needs Medical Clearance Suicidal Plan?: No Access to Means: No What has been your use of drugs/alcohol within the last 12 months?: denies How many times?: 0 Other Self Harm Risks: n/a Triggers for Past Attempts: None known Intentional Self Injurious Behavior: NoneNo Risk to Others: Homicidal Ideation: No Thoughts of Harm to Others: No Current Homicidal Intent: No Current Homicidal Plan: No Access to Homicidal Means: No Assessment of Violence: None Noted Does patient have access to weapons?: No Criminal Charges Pending?: No Does patient have a court date: NoYes Prior Inpatient Therapy: Prior Inpatient Therapy: Yes Prior Therapy Dates: 2019 Prior Therapy Facilty/Provider(s): [facility] Reason for Treatment: DepressionYes Prior Outpatient Therapy: Prior Outpatient Therapy: No Does patient have an ACCT team?: No Does patient have Intensive In-House Services?  : No Does patient have [facility] services? : No Does patient have P4CC services?: NoYes  Past Medical History:  Past Medical History:  Diagnosis Date  . Depression   . Prostate disease     Past Surgical History:  Procedure Laterality Date  . COLONOSCOPY WITH PROPOFOL N/A 03/22/2018   Procedure: COLONOSCOPY WITH PROPOFOL;  Surgeon: Jonathon Bellows, MD;  Location: [facility] ENDOSCOPY;  Service: Gastroenterology;  Laterality: N/A;  . HERNIA REPAIR     Family History: No family history on file.   Family Psychiatric  History: None indicated  Social History:  Social History   Substance and Sexual Activity  Alcohol Use Not Currently     Social History   Substance and Sexual Activity  Drug Use Not Currently    Social History   Socioeconomic History  . Marital status: Married    Spouse name: Reitta  . Number of children: 1  . Years of education: Not on file   . Highest education level: Not on file  Occupational History  . Occupation: retired  Scientific laboratory technician  . Financial resource strain: Not hard at all  . Food insecurity:    Worry: Never true    Inability: Never true  . Transportation needs:    Medical: No    Non-medical: No  Tobacco Use  . Smoking status: Former Research scientist (life sciences)  . Smokeless tobacco: Former Systems developer    Quit date: 01/10/2017  Substance and Sexual Activity  . Alcohol use: Not Currently  . Drug use: Not Currently  . Sexual activity: Not Currently  Lifestyle  . Physical activity:    Days per week: 0 days    Minutes per session: 0 min  . Stress: To some extent  Relationships  . Social connections:    Talks on phone: Once a week    Gets together: Once a week    Attends religious service: Never    Active member of club or organization: No    Attends meetings of clubs or organizations: Never    Relationship status: Married  Other Topics Concern  . Not on file  Social History Narrative  . Not on file   Additional Social History:    Living with 69 year old grandson  Allergies:  No Known Allergies  Labs:  Results for orders placed or performed during the hospital encounter of 12/18/18 (  from the past 48 hour(s))  Comprehensive metabolic panel     Status: Abnormal   Collection Time: 12/18/18  5:37 PM  Result Value Ref Range   Sodium 140 135 - 145 mmol/L   Potassium 3.9 3.5 - 5.1 mmol/L   Chloride 106 98 - 111 mmol/L   CO2 27 22 - 32 mmol/L   Glucose, Bld 89 70 - 99 mg/dL   BUN 23 8 - 23 mg/dL   Creatinine, Ser 1.00 0.61 - 1.24 mg/dL   Calcium 8.9 8.9 - 10.3 mg/dL   Total Protein 7.5 6.5 - 8.1 g/dL   Albumin 4.0 3.5 - 5.0 g/dL   AST 15 15 - 41 U/L   ALT 10 0 - 44 U/L   Alkaline Phosphatase 65 38 - 126 U/L   Total Bilirubin 1.3 (H) 0.3 - 1.2 mg/dL   GFR calc non Af Amer >60 >60 mL/min   GFR calc Af Amer >60 >60 mL/min   Anion gap 7 5 - 15    Comment: Performed at [facility], [address] [place],  [place] [address]  Ethanol     Status: None   Collection Time: 12/18/18  5:37 PM  Result Value Ref Range   Alcohol, Ethyl (B) <10 <10 mg/dL    Comment: (NOTE) Lowest detectable limit for serum alcohol is 10 mg/dL. For medical purposes only. Performed at [facility], [address] [place], [place] [address]   Salicylate level     Status: None   Collection Time: 12/18/18  5:37 PM  Result Value Ref Range   Salicylate Lvl <7.8 2.8 - 30.0 mg/dL    Comment: Performed at [facility], [address] [place], [place] [address]  Acetaminophen level     Status: Abnormal   Collection Time: 12/18/18  5:37 PM  Result Value Ref Range   Acetaminophen (Tylenol), Serum <10 (L) 10 - 30 ug/mL    Comment: (NOTE) Therapeutic concentrations vary significantly. A range of 10-30 ug/mL  may be an effective concentration for many patients. However, some  are best treated at concentrations outside of this range. Acetaminophen concentrations >150 ug/mL at 4 hours after ingestion  and >50 ug/mL at 12 hours after ingestion are often associated with  toxic reactions. Performed at [facility], [address] [place], [place] [address]   cbc     Status: None   Collection Time: 12/18/18  5:37 PM  Result Value Ref Range   WBC 4.9 4.0 - 10.5 K/uL   RBC 4.31 4.22 - 5.81 MIL/uL   Hemoglobin 13.1 13.0 - 17.0 g/dL   HCT 40.1 39.0 - 52.0 %   MCV 93.0 80.0 - 100.0 fL   MCH 30.4 26.0 - 34.0 pg   MCHC 32.7 30.0 - 36.0 g/dL   RDW 12.3 11.5 - 15.5 %   Platelets 199 150 - 400 K/uL   nRBC 0.0 0.0 - 0.2 %    Comment: Performed at [facility], [address], [place], [place] [address]  Urine Drug Screen, Qualitative     Status: None   Collection Time: 12/18/18  8:35 PM  Result Value Ref Range   Tricyclic, Ur Screen NONE DETECTED NONE DETECTED   Amphetamines, Ur Screen NONE DETECTED NONE DETECTED   MDMA (Ecstasy)Ur Screen NONE DETECTED NONE DETECTED   Cocaine Metabolite,Ur [place]  NONE DETECTED NONE DETECTED   Opiate, Ur Screen NONE DETECTED NONE DETECTED   Phencyclidine (PCP) Ur S NONE DETECTED NONE DETECTED  Cannabinoid 50 Ng, Ur [place] NONE DETECTED NONE DETECTED   Barbiturates, Ur Screen NONE DETECTED NONE DETECTED   Benzodiazepine, Ur Scrn NONE DETECTED NONE DETECTED   Methadone Scn, Ur NONE DETECTED NONE DETECTED    Comment: (NOTE) Tricyclics + metabolites, urine    Cutoff 1000 ng/mL Amphetamines + metabolites, urine  Cutoff 1000 ng/mL MDMA (Ecstasy), urine              Cutoff 500 ng/mL Cocaine Metabolite, urine          Cutoff 300 ng/mL Opiate + metabolites, urine        Cutoff 300 ng/mL Phencyclidine (PCP), urine         Cutoff 25 ng/mL Cannabinoid, urine                 Cutoff 50 ng/mL Barbiturates + metabolites, urine  Cutoff 200 ng/mL Benzodiazepine, urine              Cutoff 200 ng/mL Methadone, urine                   Cutoff 300 ng/mL The urine drug screen provides only a preliminary, unconfirmed analytical test result and should not be used for non-medical purposes. Clinical consideration and professional judgment should be applied to any positive drug screen result due to possible interfering substances. A more specific alternate chemical method must be used in order to obtain a confirmed analytical result. Gas chromatography / mass spectrometry (GC/MS) is the preferred confirmat ory method. Performed at [facility], [address] [place], [place] [address]   SARS Coronavirus 2 (CEPHEID - Performed in [facility] hospital lab), Hosp Order     Status: None   Collection Time: 12/19/18 12:00 PM  Result Value Ref Range   SARS Coronavirus 2 NEGATIVE NEGATIVE    Comment: (NOTE) If result is NEGATIVE SARS-CoV-2 target nucleic acids are NOT DETECTED. The SARS-CoV-2 RNA is generally detectable in upper and lower  respiratory specimens during the acute phase of infection. The lowest  concentration of SARS-CoV-2 viral copies this assay can  detect is 250  copies / mL. A negative result does not preclude SARS-CoV-2 infection  and should not be used as the sole basis for treatment or other  patient management decisions.  A negative result may occur with  improper specimen collection / handling, submission of specimen other  than nasopharyngeal swab, presence of viral mutation(s) within the  areas targeted by this assay, and inadequate number of viral copies  (<250 copies / mL). A negative result must be combined with clinical  observations, patient history, and epidemiological information. If result is POSITIVE SARS-CoV-2 target nucleic acids are DETECTED. The SARS-CoV-2 RNA is generally detectable in upper and lower  respiratory specimens dur Stephens the acute phase of infection.  Positive  results are indicative of active infection with SARS-CoV-2.  Clinical  correlation with patient history and other diagnostic information is  necessary to determine patient infection status.  Positive results do  not rule out bacterial infection or co-infection with other viruses. If result is PRESUMPTIVE POSTIVE SARS-CoV-2 nucleic acids MAY BE PRESENT.   A presumptive positive result was obtained on the submitted specimen  and confirmed on repeat testing.  While 2019 novel coronavirus  (SARS-CoV-2) nucleic acids may be present in the submitted sample  additional confirmatory testing may be necessary for epidemiological  and / or clinical management purposes  to differentiate between  SARS-CoV-2 and other Sarbecovirus currently known to infect humans.  If clinically indicated additional testing with an alternate test  methodology [phone]) is advised. The SARS-CoV-2 RNA is generally  detectable in upper and lower respiratory sp ecimens during the acute  phase of infection. The expected result is Negative. Fact Sheet for Patients:  StrictlyIdeas.no Fact Sheet for Healthcare  Providers: BankingDealers.co.za This test is not yet approved or cleared by the [place] FDA and has been authorized for detection and/or diagnosis of SARS-CoV-2 by FDA under an Emergency Use Authorization (EUA).  This EUA will remain in effect (meaning this test can be used) for the duration of the COVID-19 declaration under Section 564(b)(1) of the Act, 21 U.S.C. section 360bbb-3(b)(1), unless the authorization is terminated or revoked sooner. Performed at [facility], [address] [place], [place] [address]     Current Facility-Administered Medications  Medication Dose Route Frequency Provider Last Rate Last Dose  . mirtazapine (REMERON) tablet 45 mg  45 mg Oral QHS Thom[place], Geni Bers, NP      . traZODone (DESYREL) tablet 100 mg  100 mg Oral QHS Lamont Dowdy, NP       Current Outpatient Medications  Medication Sig Dispense Refill  . mirtazapine (REMERON) 45 MG tablet TAKE 1 TABLET BY MOUTH AT BEDTIME (Patient not taking: Reported on 12/19/2018) 30 tablet 1  . tadalafil (CIALIS) 10 MG tablet Take 0.5-1 tablets (5-10 mg total) by mouth daily as needed for erectile dysfunction. (Patient not taking: Reported on 12/19/2018) 10 tablet 1  . traZODone (DESYREL) 100 MG tablet Take 1 tablet (100 mg total) by mouth at bedtime as needed for sleep. (Patient not taking: Reported on 12/19/2018) 30 tablet 2    Musculoskeletal: Strength & Muscle Tone: decreased Gait & Station: unsteady Patient leans: N/A  Psychiatric Specialty Exam: Physical Exam  Nursing note and vitals reviewed. Constitutional: He is oriented to person, place, and time. He appears well-developed and well-nourished. No distress.  HENT:  Head: Normocephalic and atraumatic.  Eyes: EOM are normal.  Neck: Normal range of motion.  Cardiovascular: Normal rate and regular rhythm.  Respiratory: Effort normal. No respiratory distress.  Musculoskeletal: Normal range of motion.   Neurological: He is alert and oriented to person, place, and time.    Review of Systems  Constitutional: Positive for malaise/fatigue.  Respiratory: Negative for shortness of breath.   Cardiovascular: Negative for chest pain and palpitations.  Gastrointestinal:       IBS  Genitourinary: Positive for frequency and urgency.  Neurological: Positive for tremors and weakness.  Psychiatric/Behavioral: Positive for depression and suicidal ideas. Negative for hallucinations, memory loss and substance abuse. The patient is nervous/anxious and has insomnia.     Blood pressure 134/82, pulse 75, temperature 98.2 F (36.8 C), temperature source Oral, resp. rate 18, height 5\' 11"  (1.803 m), weight 72.6 kg, SpO2 97 %.Body mass index is 22.32 kg/m.  General Appearance: Casual  Eye Contact:  Fair  Speech:  Slow  Volume:  Decreased  Mood:  Anxious, Depressed and Hopeless  Affect:  Depressed and Flat  Thought Process:  Coherent  Orientation:  Full (Time, Place, and Person)  Thought Content:  Logical and Rumination  Suicidal Thoughts:  Yes.  without intent/plan  Homicidal Thoughts:  No  Memory:  Immediate;   Fair Recent;   Fair  Judgement:  Fair  Insight:  Fair  Psychomotor Activity:  Decreased  Concentration:  Concentration: Fair and Attention Span: Fair  Recall:  Good  Fund of Knowledge:  Fair  Language:  Good  Akathisia:  NA  Handed:  Right  AIMS (if indicated):     Assets:  Desire for Improvement Physical Health Social Support  ADL's:  Impaired  Cognition:  Impaired,  Mild  Sleep:   Not good     Treatment Plan Summary: Daily contact with patient to assess and evaluate symptoms and progress in treatment and Medication management deferred to inpatient psychiatric team. He has been was restarted on mirtazapine 45 mg at bedtime and trazodone 100 mg at bedtime. Patient has had ECT in the past for similar episodes of severe depression with good resolution.  Disposition: Supportive  therapy provided about ongoing stressors. The patient is a safety risk to self does require psychiatric inpatient admission for stabilization and treatment. Patient has been accepted to [facility] for geriatric psychiatry. Labs and medical work-up has been completed.   Lavella Hammock, MD 12/19/2018 6:35 PM

## 2018-12-19 NOTE — BH Assessment (Addendum)
[facility] is requesting the following:  - Urinalysis (UA) - EKG - Chest X-Ray

## 2018-12-19 NOTE — Consult Note (Signed)
[facility] Face-to-Face Psychiatry Consult   Reason for Consult: Depression Referring Physician: Dr. Quentin Cornwall Patient Identification: Baruch Lewers Couzens MRN:  [id number] Principal Diagnosis: MDD (major depressive disorder), recurrent episode, severe ([place]) Diagnosis:  Principal Problem:   MDD (major depressive disorder), recurrent episode, severe ([place])   Total Time spent with patient: 1 hour  Subjective: "My back hurt really bad."  "I am in a bad shape." [name] is a 63 y.o. male patient presented to [facility] ED via EMS.  It was discussed that the patient grandson had contacted EMS due to the patient increased depressive state.  The patient grandson states that due to COVID 19 virus the patient has remain at home and indoors and has gotten progressively worse.  He voiced the patient has been off his mirtazapine 45 MG tablet at bedtime and trazodone 100 mg at bedtime.  The patient grandson states the patient has been experiencing decreased appetite and increased weakness. The patient was seen face-to-face by this provider; chart reviewed and consulted with Dr.Brown on 12/19/2018 due to the care of the patient. It was discussed with the provider that the patient does meet criteria to be admitted to the inpatient unit.  It was discussed by Dr. Owens Shark and this provider that the patient was an ECT patient of Dr. Weber Cooks.  Per the patient grandson who stated the treatment was effective and currently inquiring about the patient receiving ECT during this admission. On evaluation the patient  is alert and oriented x4, calm and cooperative, with depressed mood.  The patient does not appear to be responding to internal or external stimuli. Neither is the patient presenting with any delusional thinking. The patient denies auditory or visual hallucinations. The patient admits to sometimes having suicidal thoughts but denies, homicidal ideations. The patient is not presenting with any psychotic or paranoid behaviors.  During an encounter with the patient, he was able to answer questions appropriately. Collateral was obtained from the patient grandson and TTS counselor Ms. Hester received  the information from the patient's grandson. Collateral information was obtained from brother in regards to safety planning Plan: The patient is a safety risk to self does require psychiatric inpatient admission for stabilization and treatment.  HPI: Per Dr. Quentin Cornwall; [name] is a 63 y.o. male with a history of severe depression previously requiring admissions to hospital for ECT not currently on any antidepressive medication presents the ER via EMS and request of grandson who is noticed severe deterioration in the patient over the past few weeks.  Patient has not left his house.  Becoming increasingly isolated not eating.  Concern for severe debilitating depression.  Patient denies any SI.    The patient that this is similar to previous time that he was admitted to the hospital was subsequently discharged to much better shape.  Past Psychiatric History:  Depression  Risk to Self:  No Risk to Others:  Yes Prior Inpatient Therapy:  Yes Prior Outpatient Therapy:  Yes  Past Medical History:  Past Medical History:  Diagnosis Date  . Depression   . Prostate disease     Past Surgical History:  Procedure Laterality Date  . COLONOSCOPY WITH PROPOFOL N/A 03/22/2018   Procedure: COLONOSCOPY WITH PROPOFOL;  Surgeon: Jonathon Bellows, MD;  Location: [facility] ENDOSCOPY;  Service: Gastroenterology;  Laterality: N/A;  . HERNIA REPAIR     Family History: No family history on file. Family Psychiatric  History:  Social History:  Social History   Substance and Sexual Activity  Alcohol  Use Not Currently     Social History   Substance and Sexual Activity  Drug Use Not Currently    Social History   Socioeconomic History  . Marital status: Married    Spouse name: Reitta  . Number of children: 1  . Years of education: Not on  file  . Highest education level: Not on file  Occupational History  . Occupation: retired  Scientific laboratory technician  . Financial resource strain: Not hard at all  . Food insecurity:    Worry: Never true    Inability: Never true  . Transportation needs:    Medical: No    Non-medical: No  Tobacco Use  . Smoking status: Former Research scientist (life sciences)  . Smokeless tobacco: Former Systems developer    Quit date: 01/10/2017  Substance and Sexual Activity  . Alcohol use: Not Currently  . Drug use: Not Currently  . Sexual activity: Not Currently  Lifestyle  . Physical activity:    Days per week: 0 days    Minutes per session: 0 min  . Stress: To some extent  Relationships  . Social connections:    Talks on phone: Once a week    Gets together: Once a week    Attends religious service: Never    Active member of club or organization: No    Attends meetings of clubs or organizations: Never    Relationship status: Married  Other Topics Concern  . Not on file  Social History Narrative  . Not on file   Additional Social History:    Allergies:  No Known Allergies  Labs:  Results for orders placed or performed during the hospital encounter of 12/18/18 (from the past 48 hour(s))  Comprehensive metabolic panel     Status: Abnormal   Collection Time: 12/18/18  5:37 PM  Result Value Ref Range   Sodium 140 135 - 145 mmol/L   Potassium 3.9 3.5 - 5.1 mmol/L   Chloride 106 98 - 111 mmol/L   CO2 27 22 - 32 mmol/L   Glucose, Bld 89 70 - 99 mg/dL   BUN 23 8 - 23 mg/dL   Creatinine, Ser 1.00 0.61 - 1.24 mg/dL   Calcium 8.9 8.9 - 10.3 mg/dL   Total Protein 7.5 6.5 - 8.1 g/dL   Albumin 4.0 3.5 - 5.0 g/dL   AST 15 15 - 41 U/L   ALT 10 0 - 44 U/L   Alkaline Phosphatase 65 38 - 126 U/L   Total Bilirubin 1.3 (H) 0.3 - 1.2 mg/dL   GFR calc non Af Amer >60 >60 mL/min   GFR calc Af Amer >60 >60 mL/min   Anion gap 7 5 - 15    Comment: Performed at [facility], [address] [place], [place] [address]  Ethanol      Status: None   Collection Time: 12/18/18  5:37 PM  Result Value Ref Range   Alcohol, Ethyl (B) <10 <10 mg/dL    Comment: (NOTE) Lowest detectable limit for serum alcohol is 10 mg/dL. For medical purposes only. Performed at [facility], [address] [place], [place] [address]   Salicylate level     Status: None   Collection Time: 12/18/18  5:37 PM  Result Value Ref Range   Salicylate Lvl <7.6 2.8 - 30.0 mg/dL    Comment: Performed at [facility], [address] [place],  [address]  Acetaminophen level     Status: Abnormal   Collection Time: 12/18/18  5:37 PM  Result Value Ref Range   Acetaminophen (Tylenol), Serum <10 (L) 10 - 30 ug/mL    Comment: (NOTE) Therapeutic concentrations vary significantly. A range of 10-30 ug/mL  may be an effective concentration for many patients. However, some  are best treated at concentrations outside of this range. Acetaminophen concentrations >150 ug/mL at 4 hours after ingestion  and >50 ug/mL at 12 hours after ingestion are often associated with  toxic reactions. Performed at [facility], [address] [place], [place] [address]   cbc     Status: None   Collection Time: 12/18/18  5:37 PM  Result Value Ref Range   WBC 4.9 4.0 - 10.5 K/uL   RBC 4.31 4.22 - 5.81 MIL/uL   Hemoglobin 13.1 13.0 - 17.0 g/dL   HCT 40.1 39.0 - 52.0 %   MCV 93.0 80.0 - 100.0 fL   MCH 30.4 26.0 - 34.0 pg   MCHC 32.7 30.0 - 36.0 g/dL   RDW 12.3 11.5 - 15.5 %   Platelets 199 150 - 400 K/uL   nRBC 0.0 0.0 - 0.2 %    Comment: Performed at [facility], [address] [place], [place] [address]  Urine Drug Screen, Qualitative     Status: None   Collection Time: 12/18/18  8:35 PM  Result Value Ref Range   Tricyclic, Ur Screen NONE DETECTED NONE DETECTED   Amphetamines, Ur Screen NONE DETECTED NONE DETECTED   MDMA (Ecstasy)Ur Screen NONE DETECTED NONE DETECTED   Cocaine Metabolite,Ur [place] NONE DETECTED NONE  DETECTED   Opiate, Ur Screen NONE DETECTED NONE DETECTED   Phencyclidine (PCP) Ur S NONE DETECTED NONE DETECTED   Cannabinoid 50 Ng, Ur [place] NONE DETECTED NONE DETECTED   Barbiturates, Ur Screen NONE DETECTED NONE DETECTED   Benzodiazepine, Ur Scrn NONE DETECTED NONE DETECTED   Methadone Scn, Ur NONE DETECTED NONE DETECTED    Comment: (NOTE) Tricyclics + metabolites, urine    Cutoff 1000 ng/mL Amphetamines + metabolites, urine  Cutoff 1000 ng/mL MDMA (Ecstasy), urine              Cutoff 500 ng/mL Cocaine Metabolite, urine          Cutoff 300 ng/mL Opiate + metabolites, urine        Cutoff 300 ng/mL Phencyclidine (PCP), urine         Cutoff 25 ng/mL Cannabinoid, urine                 Cutoff 50 ng/mL Barbiturates + metabolites, urine  Cutoff 200 ng/mL Benzodiazepine, urine              Cutoff 200 ng/mL Methadone, urine                   Cutoff 300 ng/mL The urine drug screen provides only a preliminary, unconfirmed analytical test result and should not be used for non-medical purposes. Clinical consideration and professional judgment should be applied to any positive drug screen result due to possible interfering substances. A more specific alternate chemical method must be used in order to obtain a confirmed analytical result. Gas chromatography / mass spectrometry (GC/MS) is the preferred confirmat ory method. Performed at [facility], [address] [place], [place] [address]     No current facility-administered medications for this encounter.    Current Outpatient Medications  Medication Sig Dispense Refill  . mirtazapine (REMERON) 45 MG tablet TAKE 1 TABLET BY MOUTH AT BEDTIME (Patient not taking: Reported on 12/19/2018)  30 tablet 1  . tadalafil (CIALIS) 10 MG tablet Take 0.5-1 tablets (5-10 mg total) by mouth daily as needed for erectile dysfunction. (Patient not taking: Reported on 12/19/2018) 10 tablet 1  . traZODone (DESYREL) 100 MG tablet Take 1 tablet (100 mg  total) by mouth at bedtime as needed for sleep. (Patient not taking: Reported on 12/19/2018) 30 tablet 2    Musculoskeletal: Strength & Muscle Tone: decreased Gait & Station: unsteady Patient leans: N/A  Psychiatric Specialty Exam: Physical Exam  Constitutional: He is oriented to person, place, and time. He appears well-developed and well-nourished.  HENT:  Head: Normocephalic.  Eyes: Pupils are equal, round, and reactive to light. Conjunctivae are normal.  Neck: Normal range of motion. Neck supple.  Cardiovascular: Normal rate and regular rhythm.  Respiratory: Effort normal and breath sounds normal.  Musculoskeletal: Normal range of motion.  Neurological: He is alert and oriented to person, place, and time.  Skin: Skin is warm and dry.    Review of Systems  Neurological: Positive for tremors.  Psychiatric/Behavioral: Positive for depression. The patient is nervous/anxious and has insomnia.   All other systems reviewed and are negative.   Blood pressure 134/82, pulse 75, temperature 98.2 F (36.8 C), temperature source Oral, resp. rate 18, height 5\' 11"  (1.803 m), weight 72.6 kg, SpO2 97 %.Body mass index is 22.32 kg/m.  General Appearance: Disheveled  Eye Contact:  Minimal  Speech:  Slow  Volume:  Decreased  Mood:  Anxious, Depressed and Hopeless  Affect:  Blunt, Constricted, Depressed, Flat and Restricted  Thought Process:  Coherent  Orientation:  Full (Time, Place, and Person)  Thought Content:  Logical and Rumination  Suicidal Thoughts:  Yes.  without intent/plan  Homicidal Thoughts:  No  Memory:  Immediate;   Fair Recent;   Fair  Judgement:  Fair  Insight:  Fair  Psychomotor Activity:  Decreased  Concentration:  Concentration: Fair and Attention Span: Fair  Recall:  Good  Fund of Knowledge:  Fair  Language:  Good  Akathisia:  NA  Handed:  Right  AIMS (if indicated):     Assets:  Desire for Improvement Physical Health Social Support  ADL's:  Impaired   Cognition:  Impaired,  Mild  Sleep:   Not good     Treatment Plan Summary: Daily contact with patient to assess and evaluate symptoms and progress in treatment, Medication management and Plan The patient does meet criteria to be admitted to inpatient psychiatric facility and is a former patient of Dr. Weber Cooks when he received ECT about a year ago.  Disposition: Supportive therapy provided about ongoing stressors. The patient is a safety risk to self does require psychiatric inpatient admission for stabilization and treatment.    Lamont Dowdy, NP 12/19/2018 1:32 AM

## 2018-12-19 NOTE — ED Provider Notes (Signed)
-----------------------------------------   10:19 AM on 12/19/2018 -----------------------------------------  Patient has been seen by psychiatry they will be admitting to their service once a bed becomes available.   Harvest Dark, MD 12/19/18 1020

## 2018-12-20 DIAGNOSIS — F333 Major depressive disorder, recurrent, severe with psychotic symptoms: Secondary | ICD-10-CM | POA: Diagnosis not present

## 2018-12-20 MED ORDER — COLLAGENASE 250 UNIT/GM EX OINT
TOPICAL_OINTMENT | Freq: Every day | CUTANEOUS | Status: DC
  Administered 2018-12-20: 16:00:00 via TOPICAL
  Filled 2018-12-20: qty 30

## 2018-12-20 NOTE — Consult Note (Signed)
[facility] Nurse wound consult note Reason for Consult:Unstageable pressure injury to sacrum.  Present on admission. Patient was found "down at home" and unable to care for himself.  Wound type:pressure  Pressure Injury POA: Yes Measurement: 2 cm x 1.5 cm scabbed lesion Wound bed:100 % devitalized tissue Drainage (amount, consistency, odor) minimal serosanguinous Periwound:dry skin Dressing procedure/placement/frequency: Cleanse sacral/buttock wound with NS and pat dry. Apply Santyl to wound bed.Cover with Ns moist gauze.  Cover with ABD pad and tape.  Change daily.  Will not follow at this time.  Please re-consult if needed.  Domenic Moras MSN, RN, FNP-BC CWON Wound, Ostomy, Continence Nurse Pager [phone]

## 2018-12-20 NOTE — ED Notes (Signed)
Per Rosendo Gros TTS, pt is still under review at [facility] and has not been accepted yet.

## 2018-12-20 NOTE — ED Notes (Signed)
BEHAVIORAL HEALTH ROUNDING Patient sleeping: No. Patient alert and oriented: yes Behavior appropriate: Yes.  ; If no, describe:  Nutrition and fluids offered: yes Toileting and hygiene offered: Yes  Sitter present: q15 minute observations and security  monitoring Law enforcement present: Yes  ODS  

## 2018-12-20 NOTE — Evaluation (Addendum)
Physical Therapy Evaluation Patient Details Name: [name] MRN: [id number] DOB: 01-21-1956 Today's Date: 12/20/2018   History of Present Illness  [name] comes to [facility] 6/11 after decline in motivation, failure to thrive, pt essentially remaining in bed for seevral weeks comings weaker. Pt admitted with Major Depressive Episode, now being worked up for move to geriatric behavioral treatment center. PTA pt lives with grandson who works 12-14hours daily. Pt was here for a similar episode in July 2019.  Clinical Impression  Pt admitted with above diagnosis. Pt currently with functional limitations due to the deficits listed below (see "PT Problem List"). Upon entry, pt in bed, awake and agreeable to participate. The pt is alert and oriented x3, pleasant, conversational, and generally a fair historian but responses are slow and lack elaboration. MaxA for bed mobility, and modA for STS transfers with inability to establish standing balance. Pt encouraged to move to EOB for urinal use while in ED, for which he will need assistance from nursing- he is agreeable. Functional mobility assessment demonstrates increased effort/time requirements, poor tolerance, and need for physical assistance, whereas the patient performed these at a higher level of independence PTA. AMB trial not appropriate at this time as patient very weak and not able to establish standing balance. Pt will benefit from skilled PT intervention to increase independence and safety with basic mobility in preparation for discharge to the venue listed below.       Follow Up Recommendations Other (comment)(unclear what services will be available while in behavior facility)    Equipment Recommendations  Rolling walker with 5" wheels    Recommendations for Other Services       Precautions / Restrictions Precautions Precautions: Fall      Mobility  Bed Mobility Overal bed mobility: Needs Assistance Bed Mobility: Sit to  Supine;Supine to Sit     Supine to sit: Max assist Sit to supine: Mod assist   General bed mobility comments: assistiance with legs mostly; able to establish balance at EOB after 1-2 minutes, and sit for seevral minutes hands free.  Transfers Overall transfer level: Needs assistance Equipment used: 1 person hand held assist Transfers: Sit to/from Stand Sit to Stand: Mod assist         General transfer comment: STS from EOB 5x, varring strategies adn cuing, pt will difficulty getting TKE bilat, and COM over BOS, reports 'pulling in hamstrings', but confidence improves over repeated efforts.  Ambulation/Gait Ambulation/Gait assistance: (unable to attempt at this time, pt with VERY weak legs, would requires a RW and chair follow for single digit distances.)              Stairs            Wheelchair Mobility    Modified Rankin (Stroke Patients Only)       Balance Overall balance assessment: Needs assistance;History of Falls Sitting-balance support: Single extremity supported Sitting balance-Leahy Scale: Normal Sitting balance - Comments: does well with lateral lean and reach, slow but steady Postural control: Posterior lean Standing balance support: During functional activity Standing balance-Leahy Scale: Zero                               Pertinent Vitals/Pain Pain Assessment: (reports his back has been hurting, but he is not limited by pain in session)    Home Living Family/patient expects to be discharged to:: Private residence Living Arrangements: Other relatives(Grandson; pt reports wife left him  almost 3 years ago.) Available Help at Discharge: Family;Available PRN/intermittently           Home Equipment: Walker - 2 wheels(reports minimal use as it does not fit well in his home)      Prior Function Level of Independence: Independent         Comments: Pt has been largely inactive since Covid-19 lockdown. Per grandson, he has  largely stayed in bed for several weeks. Pt reports not showering for several weeks for fear of falling.     Hand Dominance        Extremity/Trunk Assessment   Upper Extremity Assessment Upper Extremity Assessment: Overall WFL for tasks assessed    Lower Extremity Assessment Lower Extremity Assessment: Generalized weakness       Communication      Cognition Arousal/Alertness: Awake/alert Behavior During Therapy: WFL for tasks assessed/performed Overall Cognitive Status: Within Functional Limits for tasks assessed                                 General Comments: slowed responses, minimally hypophonic, genrally pleasant and very gracious at times.      General Comments      Exercises     Assessment/Plan    PT Assessment Patient needs continued PT services  PT Problem List Decreased strength;Decreased activity tolerance;Decreased balance;Decreased mobility;Decreased range of motion       PT Treatment Interventions DME instruction;Balance training;Gait training;Stair training;Functional mobility training;Therapeutic activities;Therapeutic exercise;Patient/family education    PT Goals (Current goals can be found in the Care Plan section)  Acute Rehab PT Goals Patient Stated Goal: regain strength and move around more PT Goal Formulation: With patient Time For Goal Achievement: 01/03/19 Potential to Achieve Goals: Good    Frequency Min 2X/week   Barriers to discharge Inaccessible home environment      Co-evaluation               AM-PAC PT "6 Clicks" Mobility  Outcome Measure Help needed turning from your back to your side while in a flat bed without using bedrails?: Total Help needed moving from lying on your back to sitting on the side of a flat bed without using bedrails?: Total Help needed moving to and from a bed to a chair (including a wheelchair)?: A Lot Help needed standing up from a chair using your arms (e.g., wheelchair or bedside  chair)?: A Lot Help needed to walk in hospital room?: Total Help needed climbing 3-5 steps with a railing? : Total 6 Click Score: 8    End of Session Equipment Utilized During Treatment: Gait belt Activity Tolerance: Patient tolerated treatment well;Patient limited by fatigue Patient left: in bed;Other (comment)(bed positioned for tall sitting) Nurse Communication: Mobility status PT Visit Diagnosis: Muscle weakness (generalized) (M62.81);Difficulty in walking, not elsewhere classified (R26.2)    Time: 3235-5732 PT Time Calculation (min) (ACUTE ONLY): 25 min   Charges:   PT Evaluation $PT Eval Low Complexity: 1 Low PT Treatments $Therapeutic Exercise: 8-22 mins        4:55 PM, 12/20/18 Etta Grandchild, PT, DPT Physical Therapist - [facility]  [phone] ([place])   Chaim Gatley C 12/20/2018, 4:53 PM

## 2018-12-20 NOTE — ED Notes (Signed)
BEHAVIORAL HEALTH ROUNDING Patient sleeping: Yes.   Patient alert and oriented: eyes closed  Appears asleep Behavior appropriate: Yes.  ; If no, describe:  Nutrition and fluids offered: Yes  Toileting and hygiene offered: sleeping Sitter present: q 15 minute observations and security monitoring Law enforcement present: yes  ODS 

## 2018-12-20 NOTE — ED Notes (Signed)
Pt states  "Please give information to my grandson  Anastasia Pall or my wife [name] Monreal  [phone]"

## 2018-12-20 NOTE — ED Notes (Signed)
ED  Is the patient under IVC or is there intent for IVC: Yes.   Is the patient medically cleared: Yes.   Is there vacancy in the ED BHU: Yes.   Is the population mix appropriate for patient: pt does not ambulate  Is the patient awaiting placement in inpatient or outpatient setting: Yes.   Has the patient had a psychiatric consult: Yes.   Survey of unit performed for contraband, proper placement and condition of furniture, tampering with fixtures in bathroom, shower, and each patient room: Yes.  ; Findings:  APPEARANCE/BEHAVIOR Calm and cooperative NEURO ASSESSMENT Orientation: oriented x3  Denies pain Hallucinations: No.None noted (Hallucinations)  denies Speech: Normal Gait: he has not gotten oob RESPIRATORY ASSESSMENT Even  Unlabored respirations  CARDIOVASCULAR ASSESSMENT Pulses equal   regular rate  Skin warm and dry   GASTROINTESTINAL ASSESSMENT no GI complaint EXTREMITIES Full ROM  PLAN OF CARE Provide calm/safe environment. Vital signs assessed twice daily. ED BHU Assessment once each 12-hour shift. Collaborate with TTS if available or as condition indicates. Assure the ED provider has rounded once each shift. Provide and encourage hygiene. Provide redirection as needed. Assess for escalating behavior; address immediately and inform ED provider.  Assess family dynamic and appropriateness for visitation as needed: Yes.  ; If necessary, describe findings:  Educate the patient/family about BHU procedures/visitation: Yes.  ; If necessary, describe findings:

## 2018-12-20 NOTE — Consult Note (Signed)
[facility] Consult   Reason for Consult: Depression Referring Physician: Dr. Quentin Stephens Patient Identification: [name] MRN:  [id number] Principal Diagnosis: MDD (major depressive disorder), recurrent episode, severe ([place]) Diagnosis:  Principal Problem:   MDD (major depressive disorder), recurrent episode, severe ([place])  Total Time spent with patient: 15 minutes   Patient is seen, chart is reviewed.  Per nursing, patient slept through the night.  He has been calm and cooperative without having any behavioral issues.  He has been speaking with his family on the phone.  Nursing notes that patient has pressure injury to his sacrum.  Wound consult is placed.  Physical therapy to evaluate patient due to his poor mobility.  Patient has been minimally ambulating due to his severe depression, and this has caused him to have incontinence.  Patient continues to have depressed flat affect.  He continues to express thoughts of wishing he were dead and being a burden to his family.  He has low energy, poor concentration, poor sleep, poor appetite and psychomotor retardation.  Patient has been accepted at a geriatric psychiatric inpatient unit.  EKG, chest x-ray, UA and routine labs have been ordered and are attached to the chart.  COVID screening is completed and negative.  Per initial psychiatric intake by NP: Subjective: "My back hurt really bad."  "I am in a bad shape." [name] is a 63 y.o. male patient presented to [facility] ED via EMS.  It was discussed that the patient grandson had contacted EMS due to the patient increased depressive state.  The patient grandson states that due to COVID 19 virus the patient has remain at home and indoors and has gotten progressively worse.  He voiced the patient has been off his mirtazapine 45 MG tablet at bedtime and trazodone 100 mg at bedtime.  The patient grandson states the patient has been experiencing decreased appetite and increased  weakness. The patient was seen face-to-face by this provider; chart reviewed and consulted with Dr.Brown on 12/19/2018 due to the care of the patient. It was discussed with the provider that the patient does meet criteria to be admitted to the inpatient unit.  It was discussed by Dr. Owens Shark and this provider that the patient was an ECT patient of Dr. Weber Stephens.  Per the patient grandson who stated the treatment was effective and currently inquiring about the patient receiving ECT during this admission. On evaluation the patient  is alert and oriented x4, calm and cooperative, with depressed mood.  The patient does not appear to be responding to internal or external stimuli. Neither is the patient presenting with any delusional thinking. The patient denies auditory or visual hallucinations. The patient admits to sometimes having suicidal thoughts but denies, homicidal ideations. The patient is not presenting with any psychotic or paranoid behaviors. During an encounter with the patient, he was able to answer questions appropriately. Collateral was obtained from the patient grandson and TTS counselor Ms. [name] received  the information from the patient's grandson. Collateral information was obtained from brother in regards to safety planning Plan: The patient is a safety risk to self does require psychiatric inpatient admission for stabilization and treatment. HPI: Per Dr. Quentin Stephens; [name] is a 63 y.o. male with a history of severe depression previously requiring admissions to hospital for ECT not currently on any antidepressive medication presents the ER via EMS and request of grandson who is noticed severe deterioration in the patient over the past few weeks.  Patient has not  left his house.  Becoming increasingly isolated not eating.  Concern for severe debilitating depression.  Patient denies any SI.    The patient that this is similar to previous time that he was admitted to the hospital was  subsequently discharged to much better shape.  Past Psychiatric History: Depression  Risk to Self: Suicidal Ideation: Yes-Currently Present Suicidal Intent: No Is patient at risk for suicide?: No, but patient needs Medical Clearance Suicidal Plan?: No Access to Means: No What has been your use of drugs/alcohol within the last 12 months?: denies How many times?: 0 Other Self Harm Risks: n/a Triggers for Past Attempts: None known Intentional Self Injurious Behavior: NoneNo Risk to Others: Homicidal Ideation: No Thoughts of Harm to Others: No Current Homicidal Intent: No Current Homicidal Plan: No Access to Homicidal Means: No Assessment of Violence: None Noted Does patient have access to weapons?: No Criminal Charges Pending?: No Does patient have a court date: NoYes Prior Inpatient Therapy: Prior Inpatient Therapy: Yes Prior Therapy Dates: 2019 Prior Therapy Facilty/Provider(s): [facility] Reason for Treatment: DepressionYes Prior Outpatient Therapy: Prior Outpatient Therapy: No Does patient have an ACCT team?: No Does patient have Intensive In-House Services?  : No Does patient have [facility] services? : No Does patient have P4CC services?: NoYes  Past Medical History:  Past Medical History:  Diagnosis Date  . Depression   . Prostate disease     Past Surgical History:  Procedure Laterality Date  . COLONOSCOPY WITH PROPOFOL N/A 03/22/2018   Procedure: COLONOSCOPY WITH PROPOFOL;  Surgeon: Arthur Bellows, MD;  Location: [facility] ENDOSCOPY;  Service: Gastroenterology;  Laterality: N/A;  . HERNIA REPAIR     Family History: No family history on file.   Family Psychiatric  History: None indicated  Social History:  Social History   Substance and Sexual Activity  Alcohol Use Not Currently     Social History   Substance and Sexual Activity  Drug Use Not Currently    Social History   Socioeconomic History  . Marital status: Married    Spouse name: [name]  . Number of children:  1  . Years of education: Not on file  . Highest education level: Not on file  Occupational History  . Occupation: retired  Scientific laboratory technician  . Financial resource strain: Not hard at all  . Food insecurity    Worry: Never true    Inability: Never true  . Transportation needs    Medical: No    Non-medical: No  Tobacco Use  . Smoking status: Former Research scientist (life sciences)  . Smokeless tobacco: Former Systems developer    Quit date: 01/10/2017  Substance and Sexual Activity  . Alcohol use: Not Currently  . Drug use: Not Currently  . Sexual activity: Not Currently  Lifestyle  . Physical activity    Days per week: 0 days    Minutes per session: 0 min  . Stress: To some extent  Relationships  . Social Herbalist on phone: Once a week    Gets together: Once a week    Attends religious service: Never    Active member of club or organization: No    Attends meetings of clubs or organizations: Never    Relationship status: Married  Other Topics Concern  . Not on file  Social History Narrative  . Not on file   Additional Social History:    Living with 3 year old grandson  Allergies:  No Known Allergies  Labs:  Results for orders placed or performed during the  hospital encounter of 12/18/18 (from the past 48 hour(s))  Comprehensive metabolic panel     Status: Abnormal   Collection Time: 12/18/18  5:37 PM  Result Value Ref Range   Sodium 140 135 - 145 mmol/L   Potassium 3.9 3.5 - 5.1 mmol/L   Chloride 106 98 - 111 mmol/L   CO2 27 22 - 32 mmol/L   Glucose, Bld 89 70 - 99 mg/dL   BUN 23 8 - 23 mg/dL   Creatinine, Ser 1.00 0.61 - 1.24 mg/dL   Calcium 8.9 8.9 - 10.3 mg/dL   Total Protein 7.5 6.5 - 8.1 g/dL   Albumin 4.0 3.5 - 5.0 g/dL   AST 15 15 - 41 U/L   ALT 10 0 - 44 U/L   Alkaline Phosphatase 65 38 - 126 U/L   Total Bilirubin 1.3 (H) 0.3 - 1.2 mg/dL   GFR calc non Af Amer >60 >60 mL/min   GFR calc Af Amer >60 >60 mL/min   Anion gap 7 5 - 15    Comment: Performed at [facility],  [address], [place], [place] [address]  Ethanol     Status: None   Collection Time: 12/18/18  5:37 PM  Result Value Ref Range   Alcohol, Ethyl (B) <10 <10 mg/dL    Comment: (NOTE) Lowest detectable limit for serum alcohol is 10 mg/dL. For medical purposes only. Performed at [facility], [address] [place], [place] [address]   Salicylate level     Status: None   Collection Time: 12/18/18  5:37 PM  Result Value Ref Range   Salicylate Lvl <8.6 2.8 - 30.0 mg/dL    Comment: Performed at [facility], [address] [place], [place] [address]  Acetaminophen level     Status: Abnormal   Collection Time: 12/18/18  5:37 PM  Result Value Ref Range   Acetaminophen (Tylenol), Serum <10 (L) 10 - 30 ug/mL    Comment: (NOTE) Therapeutic concentrations vary significantly. A range of 10-30 ug/mL  may be an effective concentration for many patients. However, some  are best treated at concentrations outside of this range. Acetaminophen concentrations >150 ug/mL at 4 hours after ingestion  and >50 ug/mL at 12 hours after ingestion are often associated with  toxic reactions. Performed at [facility], [address] [place], [place] [address]   cbc     Status: None   Collection Time: 12/18/18  5:37 PM  Result Value Ref Range   WBC 4.9 4.0 - 10.5 K/uL   RBC 4.31 4.22 - 5.81 MIL/uL   Hemoglobin 13.1 13.0 - 17.0 g/dL   HCT 40.1 39.0 - 52.0 %   MCV 93.0 80.0 - 100.0 fL   MCH 30.4 26.0 - 34.0 pg   MCHC 32.7 30.0 - 36.0 g/dL   RDW 12.3 11.5 - 15.5 %   Platelets 199 150 - 400 K/uL   nRBC 0.0 0.0 - 0.2 %    Comment: Performed at [facility], [address], [place], [place] [address]  Urine Drug Screen, Qualitative     Status: None   Collection Time: 12/18/18  8:35 PM  Result Value Ref Range   Tricyclic, Ur Screen NONE DETECTED NONE DETECTED   Amphetamines, Ur Screen NONE DETECTED NONE DETECTED   MDMA (Ecstasy)Ur Screen NONE DETECTED NONE  DETECTED   Cocaine Metabolite,Ur [place] NONE DETECTED NONE DETECTED   Opiate, Ur Screen NONE DETECTED NONE DETECTED   Phencyclidine (PCP) Ur S  NONE DETECTED NONE DETECTED   Cannabinoid 50 Ng, Ur [place] NONE DETECTED NONE DETECTED   Barbiturates, Ur Screen NONE DETECTED NONE DETECTED   Benzodiazepine, Ur Scrn NONE DETECTED NONE DETECTED   Methadone Scn, Ur NONE DETECTED NONE DETECTED    Comment: (NOTE) Tricyclics + metabolites, urine    Cutoff 1000 ng/mL Amphetamines + metabolites, urine  Cutoff 1000 ng/mL MDMA (Ecstasy), urine              Cutoff 500 ng/mL Cocaine Metabolite, urine          Cutoff 300 ng/mL Opiate + metabolites, urine        Cutoff 300 ng/mL Phencyclidine (PCP), urine         Cutoff 25 ng/mL Cannabinoid, urine                 Cutoff 50 ng/mL Barbiturates + metabolites, urine  Cutoff 200 ng/mL Benzodiazepine, urine              Cutoff 200 ng/mL Methadone, urine                   Cutoff 300 ng/mL The urine drug screen provides only a preliminary, unconfirmed analytical test result and should not be used for non-medical purposes. Clinical consideration and professional judgment should be applied to any positive drug screen result due to possible interfering substances. A more specific alternate chemical method must be used in order to obtain a confirmed analytical result. Gas chromatography / mass spectrometry (GC/MS) is the preferred confirmat ory method. Performed at [facility], [address] [place], [place] [address]   SARS Coronavirus 2 (CEPHEID - Performed in [facility] hospital lab), Hosp Order     Status: None   Collection Time: 12/19/18 12:00 PM   Specimen: Nasopharyngeal Swab  Result Value Ref Range   SARS Coronavirus 2 NEGATIVE NEGATIVE    Comment: (NOTE) If result is NEGATIVE SARS-CoV-2 target nucleic acids are NOT DETECTED. The SARS-CoV-2 RNA is generally detectable in upper and lower  respiratory specimens during the acute phase of infection.  The lowest  concentration of SARS-CoV-2 viral copies this assay can detect is 250  copies / mL. A negative result does not preclude SARS-CoV-2 infection  and should not be used as the sole basis for treatment or other  patient management decisions.  A negative result may occur with  improper specimen collection / handling, submission of specimen other  than nasopharyngeal swab, presence of viral mutation(s) within the  areas targeted by this assay, and inadequate number of viral copies  (<250 copies / mL). A negative result must be combined with clinical  observations, patient history, and epidemiological information. If result is POSITIVE SARS-CoV-2 target nucleic acids are DETECTED. The SARS-CoV-2 RNA is generally detectable in upper and lower  respiratory specimens dur ing the acute phase of infection.  Positive  results are indicative of active infection with SARS-CoV-2.  Clinical  correlation with patient history and other diagnostic information is  necessary to determine patient infection status.  Positive results do  not rule out bacterial infection or co-infection with other viruses. If result is PRESUMPTIVE POSTIVE SARS-CoV-2 nucleic acids MAY BE PRESENT.   A presumptive positive result was obtained on the submitted specimen  and confirmed on repeat testing.  While 2019 novel coronavirus  (SARS-CoV-2) nucleic acids may be present in the submitted sample  additional confirmatory testing may be necessary for epidemiological  and / or clinical management purposes  to differentiate  between  SARS-CoV-2 and other Sarbecovirus currently known to infect humans.  If clinically indicated additional testing with an alternate test  methodology [phone]) is advised. The SARS-CoV-2 RNA is generally  detectable in upper and lower respiratory sp ecimens during the acute  phase of infection. The expected result is Negative. Fact Sheet for Patients:   StrictlyIdeas.no Fact Sheet for Healthcare Providers: BankingDealers.co.za This test is not yet approved or cleared by the [place] FDA and has been authorized for detection and/or diagnosis of SARS-CoV-2 by FDA under an Emergency Use Authorization (EUA).  This EUA will remain in effect (meaning this test can be used) for the duration of the COVID-19 declaration under Section 564(b)(1) of the Act, 21 U.S.C. section 360bbb-3(b)(1), unless the authorization is terminated or revoked sooner. Performed at [facility], [address] [place], [place] [address]   Urinalysis, Complete w Microscopic     Status: Abnormal   Collection Time: 12/19/18  5:51 PM  Result Value Ref Range   Color, Urine YELLOW (A) YELLOW   APPearance TURBID (A) CLEAR   Specific Gravity, Urine 1.027 1.005 - 1.030   pH 5.0 5.0 - 8.0   Glucose, UA NEGATIVE NEGATIVE mg/dL   Hgb urine dipstick NEGATIVE NEGATIVE   Bilirubin Urine NEGATIVE NEGATIVE   Ketones, ur NEGATIVE NEGATIVE mg/dL   Protein, ur NEGATIVE NEGATIVE mg/dL   Nitrite NEGATIVE NEGATIVE   Leukocytes,Ua NEGATIVE NEGATIVE   RBC / HPF 0-5 0 - 5 RBC/hpf   WBC, UA 0-5 0 - 5 WBC/hpf   Bacteria, UA NONE SEEN NONE SEEN   Squamous Epithelial / LPF NONE SEEN 0 - 5   Mucus PRESENT    Amorphous Crystal PRESENT     Comment: Performed at [facility], [address], [place], [place] [address]    Current Facility-Administered Medications  Medication Dose Route Frequency Provider Last Rate Last Dose  . mirtazapine (REMERON) tablet 45 mg  45 mg Oral QHS Lamont Dowdy, NP   45 mg at 12/19/18 2114  . traZODone (DESYREL) tablet 100 mg  100 mg Oral QHS Lamont Dowdy, NP   100 mg at 12/19/18 2114   Current Outpatient Medications  Medication Sig Dispense Refill  . mirtazapine (REMERON) 45 MG tablet TAKE 1 TABLET BY MOUTH AT BEDTIME (Patient not taking: Reported on 12/19/2018) 30 tablet 1   . tadalafil (CIALIS) 10 MG tablet Take 0.5-1 tablets (5-10 mg total) by mouth daily as needed for erectile dysfunction. (Patient not taking: Reported on 12/19/2018) 10 tablet 1  . traZODone (DESYREL) 100 MG tablet Take 1 tablet (100 mg total) by mouth at bedtime as needed for sleep. (Patient not taking: Reported on 12/19/2018) 30 tablet 2    Musculoskeletal: Strength & Muscle Tone: decreased Gait & Station: unsteady Patient leans: N/A  Psychiatric Specialty Exam: Physical Exam  Nursing note and vitals reviewed. Constitutional: He is oriented to person, place, and time. He appears well-developed and well-nourished. No distress.  HENT:  Head: Normocephalic and atraumatic.  Eyes: EOM are normal.  Neck: Normal range of motion.  Cardiovascular: Normal rate and regular rhythm.  Respiratory: Effort normal. No respiratory distress.  Musculoskeletal: Normal range of motion.  Neurological: He is alert and oriented to person, place, and time.    Review of Systems  Constitutional: Positive for malaise/fatigue.  Respiratory: Negative for shortness of breath.   Cardiovascular: Negative for chest pain and palpitations.  Gastrointestinal:       IBS  Genitourinary: Positive for frequency and urgency.  Neurological: Positive for tremors and weakness.  Psychiatric/Behavioral: Positive for depression and suicidal ideas. Negative for hallucinations, memory loss and substance abuse. The patient is nervous/anxious and has insomnia.     Blood pressure 113/70, pulse 74, temperature 97.9 F (36.6 C), temperature source Oral, resp. rate 18, height 5\' 11"  (1.803 m), weight 72.6 kg, SpO2 100 %.Body mass index is 22.32 kg/m.  General Appearance: Casual  Eye Contact:  Fair  Speech:  Slow  Volume:  Decreased  Mood:  Anxious, Depressed and Hopeless  Affect:  Depressed and Flat  Thought Process:  Coherent  Orientation:  Full (Time, Place, and Person)  Thought Content:  Logical and Rumination  Suicidal  Thoughts:  Yes.  without intent/plan  Homicidal Thoughts:  No  Memory:  Immediate;   Fair Recent;   Fair  Judgement:  Fair  Insight:  Fair  Psychomotor Activity:  Decreased  Concentration:  Concentration: Fair and Attention Span: Fair  Recall:  Good  Fund of Knowledge:  Fair  Language:  Good  Akathisia:  NA  Handed:  Right  AIMS (if indicated):     Assets:  Desire for Improvement Physical Health Social Support  ADL's:  Impaired  Cognition:  Impaired,  Mild  Sleep:   Not good     Treatment Plan Summary: Daily contact with patient to assess and evaluate symptoms and progress in treatment and Medication management deferred to inpatient psychiatric team. He has been was restarted on mirtazapine 45 mg at bedtime and trazodone 100 mg at bedtime. Patient has had ECT in the past for similar episodes of severe depression with good resolution.  Disposition: Supportive therapy provided about ongoing stressors. The patient is a safety risk to self does require psychiatric inpatient admission for stabilization and treatment. Patient has been accepted to [facility] for geriatric psychiatry. Labs and medical work-up has been completed.   Lavella Hammock, MD 12/20/2018 10:37 AM

## 2018-12-20 NOTE — ED Notes (Signed)
VOL/ Accepted to [facility].Cntr/Waiting on [facility] to transport

## 2018-12-20 NOTE — BH Assessment (Signed)
Patient has been accepted to [facility].  Accepting physician is Dr. Geanie Kenning.  Call report to [phone].  Representative was Lauree Chandler, Therapist, sports.   ER Staff is aware of it:  Holley Raring, ER Secretary  Dr. Archie Balboa, ER MD  Amy T., Patient's Nurse     Patient's Family/Support System [place]: [phone]) have been updated as well.     Address: [address] [place], [place] [address]

## 2018-12-20 NOTE — ED Notes (Signed)
Pt assisted with urinal. No other needs voiced at this time. Will continue to monitor Q15 minute rounds.

## 2018-12-20 NOTE — ED Notes (Signed)

## 2018-12-20 NOTE — ED Notes (Signed)
Pt appeared to sleep through the night. Upon awakening, he has been calm and cooperative. He is now speaking on the phone with family. Will continue to monitor for needs/safety.

## 2018-12-25 DIAGNOSIS — G609 Hereditary and idiopathic neuropathy, unspecified: Secondary | ICD-10-CM

## 2018-12-25 DIAGNOSIS — R269 Unspecified abnormalities of gait and mobility: Secondary | ICD-10-CM | POA: Insufficient documentation

## 2018-12-25 HISTORY — DX: Hereditary and idiopathic neuropathy, unspecified: G60.9

## 2018-12-27 DIAGNOSIS — I63531 Cerebral infarction due to unspecified occlusion or stenosis of right posterior cerebral artery: Secondary | ICD-10-CM | POA: Insufficient documentation

## 2019-01-10 DIAGNOSIS — E785 Hyperlipidemia, unspecified: Secondary | ICD-10-CM

## 2019-01-10 HISTORY — DX: Hyperlipidemia, unspecified: E78.5

## 2019-02-25 ENCOUNTER — Other Ambulatory Visit: Payer: Self-pay

## 2019-02-25 ENCOUNTER — Telehealth: Payer: Self-pay | Admitting: Family Medicine

## 2019-02-25 ENCOUNTER — Ambulatory Visit: Payer: Self-pay | Admitting: Family Medicine

## 2019-02-25 NOTE — Telephone Encounter (Signed)
He missed appointment is this ok

## 2019-02-25 NOTE — Telephone Encounter (Signed)
No - he needs an appointment, has not been seen by our office in 11 months and he has had a lot going on since then.  We need to see him prior to approval - can be Leisa PA-C or myself.

## 2019-02-25 NOTE — Telephone Encounter (Signed)
Copied from [place] [id number]. Topic: Quick Communication - Home Health Verbal Orders >> Feb 25, 2019 12:37 PM Leward Quan A wrote: Caller/Agency: [facility] Number: [phone] Requesting OT/PT/Skilled Nursing/Social Work/Speech Therapy: Home health nursing and PT Frequency: need to know if Raelyn Ensign wil sign off for patient to start receiving services starting on 03/07/2019

## 2019-02-26 NOTE — Telephone Encounter (Signed)
lvm on [place] vm and then I contacted Larene Beach and verbally informed her that pt would have to be seen. She verbalized understanding

## 2019-02-26 NOTE — Telephone Encounter (Signed)
Please schedule appointment.

## 2019-03-08 ENCOUNTER — Other Ambulatory Visit: Payer: Self-pay

## 2019-03-08 ENCOUNTER — Encounter: Payer: BLUE CROSS/BLUE SHIELD | Attending: Physician Assistant | Admitting: Physician Assistant

## 2019-03-08 DIAGNOSIS — I252 Old myocardial infarction: Secondary | ICD-10-CM | POA: Diagnosis not present

## 2019-03-08 DIAGNOSIS — F332 Major depressive disorder, recurrent severe without psychotic features: Secondary | ICD-10-CM | POA: Insufficient documentation

## 2019-03-08 DIAGNOSIS — I10 Essential (primary) hypertension: Secondary | ICD-10-CM | POA: Diagnosis not present

## 2019-03-08 DIAGNOSIS — L89323 Pressure ulcer of left buttock, stage 3: Secondary | ICD-10-CM | POA: Insufficient documentation

## 2019-03-08 DIAGNOSIS — I739 Peripheral vascular disease, unspecified: Secondary | ICD-10-CM | POA: Diagnosis not present

## 2019-03-08 NOTE — Progress Notes (Signed)
Sealy, VENCE BAHN ([id number]) Visit Report for 03/08/2019 Abuse/Suicide Risk Screen Details Patient Name: [name], [name]. Date of Service: 03/08/2019 2:15 PM Medical Record Number: [id number] Patient Account Number: [id number] Date of Birth/Sex: July 13, 1955 (63 y.o. M) Treating RN: Army Melia Primary Care Freedom Peddy: Raelyn Ensign Other Clinician: Referring Justis Dupas: Referral, Self Treating Shantell Belongia/Extender: STONE III, HOYT Weeks in Treatment: 0 Abuse/Suicide Risk Screen Items Answer ABUSE RISK SCREEN: Has anyone close to you tried to hurt or harm you recentlyo No Do you feel uncomfortable with anyone in your familyo No Has anyone forced you do things that you didnot want to doo No Electronic Signature(s) Signed: 03/08/2019 3:12:16 PM By: Army Melia Entered By: Army Melia on 03/08/2019 14:51:19 [name], [name] ([id number]) -------------------------------------------------------------------------------- Activities of Daily Living Details Patient Name: [name] Date of Service: 03/08/2019 2:15 PM Medical Record Number: [id number] Patient Account Number: [id number] Date of Birth/Sex: [birth year] (63 y.o. M) Treating RN: Army Melia Primary Care Genita Nilsson: Raelyn Ensign Other Clinician: Referring [place] Monds: Referral, Self Treating Yaritzy Huser/Extender: STONE III, HOYT Weeks in Treatment: 0 Activities of Daily Living Items Answer Activities of Daily Living (Please select one for each item) Drive Automobile Not Able Take Medications Completely Able Use Telephone Completely Able Care for Appearance Need Assistance Use Toilet Completely Able Bath / Shower Need Assistance Dress Self Need Assistance Feed Self Completely Able Walk Completely Able Get In / Out Bed Need Assistance Housework Need Assistance Prepare Meals Need University for Self Completely Able Electronic Signature(s) Signed: 03/08/2019 3:12:16 PM By: Army Melia Entered By:  Army Melia on 03/08/2019 14:52:22 [name], [name] ([id number]) -------------------------------------------------------------------------------- Education Screening Details Patient Name: [name] Date of Service: 03/08/2019 2:15 PM Medical Record Number: [id number] Patient Account Number: [id number] Date of Birth/Sex: 05-15-1956 (63 y.o. M) Treating RN: Army Melia Primary Care Thaily Hackworth: Raelyn Ensign Other Clinician: Referring Aleza Pew: Referral, Self Treating Alysiana Ethridge/Extender: Melburn Hake, HOYT Weeks in Treatment: 0 Primary Learner Assessed: Patient Learning Preferences/Education Level/Primary Language Learning Preference: Explanation, Demonstration Highest Education Level: College or Above Preferred Language: English Cognitive Barrier Language Barrier: No Translator Needed: No Memory Deficit: No Emotional Barrier: No Cultural/Religious Beliefs Affecting Medical Care: No Physical Barrier Impaired Vision: No Impaired Hearing: No Decreased Hand dexterity: No Knowledge/Comprehension Knowledge Level: High Comprehension Level: High Ability to understand written High instructions: Ability to understand verbal High instructions: Motivation Anxiety Level: Calm Cooperation: Cooperative Education Importance: Acknowledges Need Interest in Health Problems: Asks Questions Perception: Coherent Willingness to Engage in Self- High Management Activities: Readiness to Engage in Self- High Management Activities: Electronic Signature(s) Signed: 03/08/2019 3:12:16 PM By: Army Melia Entered By: Army Melia on 03/08/2019 14:52:42 [name], [name] ([id number]) -------------------------------------------------------------------------------- Fall Risk Assessment Details Patient Name: [name] Date of Service: 03/08/2019 2:15 PM Medical Record Number: [id number] Patient Account Number: [id number] Date of Birth/Sex: January 27, 1956 (63 y.o. M) Treating RN: Army Melia Primary Care Tasheba Henson: Raelyn Ensign Other Clinician: Referring Lurlie Wigen: Referral, Self Treating Carroll Ranney/Extender: Melburn Hake, HOYT Weeks in Treatment: 0 Fall Risk Assessment Items Have you had 2 or more falls in the last 12 monthso 0 No Have you had any fall that resulted in injury in the last 12 monthso 0 No FALLS RISK SCREEN History of falling - immediate or within 3 months 0 No Secondary diagnosis (Do you have 2 or more medical diagnoseso) 0 No Ambulatory aid None/bed rest/wheelchair/nurse 0 No Crutches/cane/walker 0 No Furniture 0 No Intravenous therapy Access/Saline/Heparin Lock 0 No Gait/Transferring Normal/ bed rest/ wheelchair 0 No  Weak (short steps with or without shuffle, stooped but able to lift head while 10 Yes walking, may seek support from furniture) Impaired (short steps with shuffle, may have difficulty arising from chair, head 20 Yes down, impaired balance) Mental Status Oriented to own ability 0 No Electronic Signature(s) Signed: 03/08/2019 3:12:16 PM By: Army Melia Entered By: Army Melia on 03/08/2019 14:52:55 [name], [name] ([id number]) -------------------------------------------------------------------------------- Nutrition Risk Screening Details Patient Name: [name], [name] Date of Service: 03/08/2019 2:15 PM Medical Record Number: [id number] Patient Account Number: [id number] Date of Birth/Sex: 1956-05-11 (63 y.o. M) Treating RN: Army Melia Primary Care Aveen Stansel: Raelyn Ensign Other Clinician: Referring Oluchi Pucci: Referral, Self Treating Javanni Maring/Extender: STONE III, HOYT Weeks in Treatment: 0 Height (in): 70 Weight (lbs): 152 Body Mass Index (BMI): 21.8 Nutrition Risk Screening Items Score Screening NUTRITION RISK SCREEN: I have an illness or condition that made me change the kind and/or amount of 0 No food I eat I eat fewer than two meals per day 0 No I eat few fruits and vegetables, or milk products 0 No I have three or more  drinks of beer, liquor or wine almost every day 0 No I have tooth or mouth problems that make it hard for me to eat 0 No I don't always have enough money to buy the food I need 0 No I eat alone most of the time 0 No I take three or more different prescribed or over-the-counter drugs a day 0 No Without wanting to, I have lost or gained 10 pounds in the last six months 0 No I am not always physically able to shop, cook and/or feed myself 0 No Nutrition Protocols Good Risk Protocol 0 No interventions needed Moderate Risk Protocol High Risk Proctocol Risk Level: Good Risk Score: 0 Electronic Signature(s) Signed: 03/08/2019 3:12:16 PM By: Army Melia Entered By: Army Melia on 03/08/2019 14:53:01

## 2019-03-08 NOTE — Progress Notes (Addendum)
[name], [name] ([id number]) Visit Report for 03/08/2019 Allergy List Details Patient Name: [name], Stephens. Date of Service: 03/08/2019 2:15 PM Medical Record Number: [id number] Patient Account Number: [id number] Date of Birth/Sex: 1956-04-27 (63 y.o. M) Treating RN: Army Melia Primary Care Abednego Yeates: Raelyn Ensign Other Clinician: Referring Rhiann Boucher: Raelyn Ensign Treating Brant Peets/Extender: STONE III, HOYT Weeks in Treatment: 0 Allergies Active Allergies No Known Allergies Allergy Notes Electronic Signature(s) Signed: 03/08/2019 3:12:16 PM By: Army Melia Entered By: Army Melia on 03/08/2019 14:46:27 [name], [name] ([id number]) -------------------------------------------------------------------------------- Arrival Information Details Patient Name: [name] Date of Service: 03/08/2019 2:15 PM Medical Record Number: [id number] Patient Account Number: [id number] Date of Birth/Sex: 1956/05/27 (63 y.o. M) Treating RN: Army Melia Primary Care Vivia Rosenburg: Raelyn Ensign Other Clinician: Referring Calani Gick: Raelyn Ensign Treating Imaan Padgett/Extender: Melburn Hake, HOYT Weeks in Treatment: 0 Visit Information Patient Arrived: Ambulatory Arrival Time: 14:35 Accompanied By: spouse Transfer Assistance: None Patient Identification Verified: Yes Secondary Verification Process Completed: Yes Patient Requires Transmission-Based No Precautions: Patient Has Alerts: No Electronic Signature(s) Signed: 03/08/2019 3:12:16 PM By: Army Melia Entered By: Army Melia on 03/08/2019 14:36:12 [name], [name] ([id number]) -------------------------------------------------------------------------------- Clinic Level of Care Assessment Details Patient Name: [name] Date of Service: 03/08/2019 2:15 PM Medical Record Number: [id number] Patient Account Number: [id number] Date of Birth/Sex: [birth year] (63 y.o. M) Treating RN: Montey Hora Primary Care Donaldo Teegarden: Raelyn Ensign Other  Clinician: Referring Alyah Boehning: Raelyn Ensign Treating Takiah Maiden/Extender: Melburn Hake, HOYT Weeks in Treatment: 0 Clinic Level of Care Assessment Items TOOL 2 Quantity Score []  - Use when only an EandM is performed on the INITIAL visit 0 ASSESSMENTS - Nursing Assessment / Reassessment X - General Physical Exam (combine w/ comprehensive assessment (listed just below) when 1 20 performed on new pt. evals) X- 1 25 Comprehensive Assessment (HX, ROS, Risk Assessments, Wounds Hx, etc.) ASSESSMENTS - Wound and Skin Assessment / Reassessment X - Simple Wound Assessment / Reassessment - one wound 1 5 []  - 0 Complex Wound Assessment / Reassessment - multiple wounds []  - 0 Dermatologic / Skin Assessment (not related to wound area) ASSESSMENTS - Ostomy and/or Continence Assessment and Care []  - Incontinence Assessment and Management 0 []  - 0 Ostomy Care Assessment and Management (repouching, etc.) PROCESS - Coordination of Care X - Simple Patient / Family Education for ongoing care 1 15 []  - 0 Complex (extensive) Patient / Family Education for ongoing care X- 1 10 Staff obtains Programmer, systems, Records, Test Results / Process Orders []  - 0 Staff telephones HHA, Nursing Homes / Clarify orders / etc []  - 0 Routine Transfer to another Facility (non-emergent condition) []  - 0 Routine Hospital Admission (non-emergent condition) []  - 0 New Admissions / Biomedical engineer / Ordering NPWT, Apligraf, etc. []  - 0 Emergency Hospital Admission (emergent condition) X- 1 10 Simple Discharge Coordination []  - 0 Complex (extensive) Discharge Coordination PROCESS - Special Needs []  - Pediatric / Minor Patient Management 0 []  - 0 Isolation Patient Management Stephens, Arthur W. ([id number]) []  - 0 Hearing / Language / Visual special needs []  - 0 Assessment of Community assistance (transportation, D/C planning, etc.) []  - 0 Additional assistance / Altered mentation []  - 0 Support Surface(s)  Assessment (bed, cushion, seat, etc.) INTERVENTIONS - Wound Cleansing / Measurement X - Wound Imaging (photographs - any number of wounds) 1 5 []  - 0 Wound Tracing (instead of photographs) X- 1 5 Simple Wound Measurement - one wound []  - 0 Complex Wound Measurement - multiple wounds X- 1 5 Simple Wound  Cleansing - one wound []  - 0 Complex Wound Cleansing - multiple wounds INTERVENTIONS - Wound Dressings X - Small Wound Dressing one or multiple wounds 1 10 []  - 0 Medium Wound Dressing one or multiple wounds []  - 0 Large Wound Dressing one or multiple wounds []  - 0 Application of Medications - injection INTERVENTIONS - Miscellaneous []  - External ear exam 0 []  - 0 Specimen Collection (cultures, biopsies, blood, body fluids, etc.) []  - 0 Specimen(s) / Culture(s) sent or taken to Lab for analysis []  - 0 Patient Transfer (multiple staff / [name] / Similar devices) []  - 0 Simple Staple / Suture removal (25 or less) []  - 0 Complex Staple / Suture removal (26 or more) []  - 0 Hypo / Hyperglycemic Management (close monitor of Blood Glucose) []  - 0 Ankle / Brachial Index (ABI) - do not check if billed separately Has the patient been seen at the hospital within the last three years: Yes Total Score: 110 Level Of Care: New/Established - Level 3 Electronic Signature(s) Signed: 03/08/2019 4:02:04 PM By: Montey Hora Entered By: Montey Hora on 03/08/2019 15:25:02 [name], [name] ([id number]) -------------------------------------------------------------------------------- Encounter Discharge Information Details Patient Name: [name] Date of Service: 03/08/2019 2:15 PM Medical Record Number: [id number] Patient Account Number: [id number] Date of Birth/Sex: 13-Apr-1956 (63 y.o. M) Treating RN: Montey Hora Primary Care Berneita Sanagustin: Raelyn Ensign Other Clinician: Referring Skyah Hannon: Raelyn Ensign Treating Lynnie Koehler/Extender: Melburn Hake, HOYT Weeks in Treatment: 0 Encounter  Discharge Information Items Post Procedure Vitals Discharge Condition: Stable Temperature (F): 98.6 Ambulatory Status: Ambulatory Pulse (bpm): 75 Discharge Destination: Home Respiratory Rate (breaths/min): 16 Transportation: Private Auto Blood Pressure (mmHg): 122/68 Accompanied By: spouse Schedule Follow-up Appointment: Yes Clinical Summary of Care: Electronic Signature(s) Signed: 03/08/2019 4:02:04 PM By: Montey Hora Entered By: Montey Hora on 03/08/2019 15:36:35 Koors, [name] ([id number]) -------------------------------------------------------------------------------- Lower Extremity Assessment Details Patient Name: [name] Date of Service: 03/08/2019 2:15 PM Medical Record Number: [id number] Patient Account Number: [id number] Date of Birth/Sex: June 02, 1956 (63 y.o. M) Treating RN: Army Melia Primary Care Suzzane Quilter: Raelyn Ensign Other Clinician: Referring Kymir Coles: Raelyn Ensign Treating Winner Valeriano/Extender: Melburn Hake, HOYT Weeks in Treatment: 0 Electronic Signature(s) Signed: 03/08/2019 3:12:16 PM By: Army Melia Entered By: Army Melia on 03/08/2019 14:45:30 Briddell, [name] ([id number]) -------------------------------------------------------------------------------- Multi Wound Chart Details Patient Name: [name] Date of Service: 03/08/2019 2:15 PM Medical Record Number: [id number] Patient Account Number: [id number] Date of Birth/Sex: 04-16-1956 (63 y.o. M) Treating RN: Montey Hora Primary Care Ariel Dimitri: Raelyn Ensign Other Clinician: Referring Unknown Flannigan: Raelyn Ensign Treating Domnick Chervenak/Extender: STONE III, HOYT Weeks in Treatment: 0 Vital Signs Height(in): 70 Pulse(bpm): 75 Weight(lbs): 152 Blood Pressure(mmHg): 122/68 Body Mass Index(BMI): 22 Temperature(F): 98.6 Respiratory Rate 16 (breaths/min): Photos: [N/A:N/A] Wound Location: Left Gluteus N/A N/A Wounding Event: Pressure Injury N/A N/A Primary Etiology: Pressure Ulcer N/A  N/A Date Acquired: 12/10/2018 N/A N/A Weeks of Treatment: 0 N/A N/A Wound Status: Open N/A N/A Measurements L x W x D 1.2x2x0.2 N/A N/A (cm) Area (cm) : 1.885 N/A N/A Volume (cm) : 0.377 N/A N/A Classification: Category/Stage III N/A N/A Exudate Amount: Medium N/A N/A Exudate Type: Serosanguineous N/A N/A Exudate Color: red, brown N/A N/A Granulation Amount: Large (67-100%) N/A N/A Granulation Quality: Red N/A N/A Necrotic Amount: Small (1-33%) N/A N/A Exposed Structures: Fat Layer (Subcutaneous N/A N/A Tissue) Exposed: Yes Fascia: No Tendon: No Muscle: No Joint: No Bone: No Epithelialization: None N/A N/A Treatment Notes Alen, Caydn W. ([id number]) Electronic Signature(s) Signed: 03/08/2019 4:02:04 PM By:  Dorthy, Di Kindle Entered By: Montey Hora on 03/08/2019 15:20:26 Howatt, [name] ([id number]) -------------------------------------------------------------------------------- [place] Details Patient Name: [name] Date of Service: 03/08/2019 2:15 PM Medical Record Number: [id number] Patient Account Number: [id number] Date of Birth/Sex: [birth year] (63 y.o. M) Treating RN: Montey Hora Primary Care Olar Santini: Raelyn Ensign Other Clinician: Referring Brittni Hult: Raelyn Ensign Treating Kenney Going/Extender: Melburn Hake, HOYT Weeks in Treatment: 0 Active Inactive Electronic Signature(s) Signed: 04/29/2019 5:04:43 PM By: Gretta Cool, BSN, RN, CWS, Kim RN, BSN Signed: 05/29/2019 4:48:32 PM By: Montey Hora Previous Signature: 03/08/2019 4:02:04 PM Version By: Montey Hora Entered By: Gretta Cool BSN, RN, CWS, Kim on 04/29/2019 17:04:43 Saephan, [name] ([id number]) -------------------------------------------------------------------------------- Pain Assessment Details Patient Name: [name] Date of Service: 03/08/2019 2:15 PM Medical Record Number: [id number] Patient Account Number: [id number] Date of Birth/Sex: 12/23/1955 (63 y.o. M) Treating RN:  Army Melia Primary Care Karesa Maultsby: Raelyn Ensign Other Clinician: Referring Bryer Gottsch: Raelyn Ensign Treating Stone Spirito/Extender: Melburn Hake, HOYT Weeks in Treatment: 0 Active Problems Location of Pain Severity and Description of Pain Patient Has Paino No Site Locations Pain Management and Medication Current Pain Management: Electronic Signature(s) Signed: 03/08/2019 3:12:16 PM By: Army Melia Entered By: Army Melia on 03/08/2019 14:36:28 Stefanko, [name] ([id number]) -------------------------------------------------------------------------------- Patient/Caregiver Education Details Patient Name: Trinidad Curet Date of Service: 03/08/2019 2:15 PM Medical Record Number: [id number] Patient Account Number: [id number] Date of Birth/Gender: [birth year] (63 y.o. M) Treating RN: Montey Hora Primary Care Physician: Raelyn Ensign Other Clinician: Referring Physician: Raelyn Ensign Treating Physician/Extender: Sharalyn Ink in Treatment: 0 Education Assessment Education Provided To: Patient and Caregiver Education Topics Provided Nutrition: Handouts: Nutrition Methods: Explain/Verbal Responses: State content correctly Pressure: Handouts: Preventing Pressure Ulcers Methods: Explain/Verbal Responses: State content correctly Electronic Signature(s) Signed: 03/08/2019 4:02:04 PM By: Montey Hora Entered By: Montey Hora on 03/08/2019 15:25:25 Souders, [name] ([id number]) -------------------------------------------------------------------------------- Wound Assessment Details Patient Name: [name] Date of Service: 03/08/2019 2:15 PM Medical Record Number: [id number] Patient Account Number: [id number] Date of Birth/Sex: Nov 28, 1955 (63 y.o. M) Treating RN: Army Melia Primary Care Trenden Hazelrigg: Raelyn Ensign Other Clinician: Referring Tifany Hirsch: Raelyn Ensign Treating Edia Pursifull/Extender: STONE III, HOYT Weeks in Treatment: 0 Wound Status Wound Number: 1 Primary Etiology:  Pressure Ulcer Wound Location: Left Gluteus Wound Status: Open Wounding Event: Pressure Injury Date Acquired: 12/10/2018 Weeks Of Treatment: 0 Clustered Wound: No Photos Wound Measurements Length: (cm) 1.2 % Reduction in Width: (cm) 2 % Reduction in Depth: (cm) 0.2 Epithelializat Area: (cm) 1.885 Tunneling: Volume: (cm) 0.377 Undermining: Area: Volume: ion: None No No Wound Description Classification: Category/Stage III Foul Odor Afte Exudate Amount: Medium Slough/Fibrino Exudate Type: Serosanguineous Exudate Color: red, brown r Cleansing: No Yes Wound Bed Granulation Amount: Large (67-100%) Exposed Structure Granulation Quality: Red Fascia Exposed: No Necrotic Amount: Small (1-33%) Fat Layer (Subcutaneous Tissue) Exposed: Yes Necrotic Quality: Adherent Slough Tendon Exposed: No Muscle Exposed: No Joint Exposed: No Bone Exposed: No Electronic Signature(s) Signed: 03/08/2019 3:12:16 PM By: Geraldine Contras ([id number]) Entered By: Army Melia on 03/08/2019 14:42:38 Prust, [name] ([id number]) -------------------------------------------------------------------------------- Vitals Details Patient Name: [name] Date of Service: 03/08/2019 2:15 PM Medical Record Number: [id number] Patient Account Number: [id number] Date of Birth/Sex: 01/12/1956 (63 y.o. M) Treating RN: Army Melia Primary Care Mats Jeanlouis: Raelyn Ensign Other Clinician: Referring Jesusa Stenerson: Raelyn Ensign Treating Shuayb Schepers/Extender: STONE III, HOYT Weeks in Treatment: 0 Vital Signs Time Taken: 14:36 Temperature (F): 98.6 Height (in): 70 Pulse (bpm): 75 Source: Stated Respiratory Rate (breaths/min): 16 Weight (lbs): 152 Blood Pressure (  mmHg): 122/68 Source: Stated Reference Range: 80 - 120 mg / dl Body Mass Index (BMI): 21.8 Electronic Signature(s) Signed: 03/08/2019 3:12:16 PM By: Army Melia Entered By: Army Melia on 03/08/2019 14:36:59

## 2019-03-10 NOTE — Progress Notes (Signed)
AXXEL, MASTIN ([id number]) Visit Report for 03/08/2019 Chief Complaint Document Details Patient Name: [name], [name]. Date of Service: 03/08/2019 2:15 PM Medical Record Number: [id number] Patient Account Number: [id number] Date of Birth/Sex: 09/04/1955 (63 y.o. M) Treating RN: Montey Hora Primary Care Provider: Raelyn Ensign Other Clinician: Referring Provider: Referral, Self Treating Provider/Extender: Melburn Hake, HOYT Weeks in Treatment: 0 Information Obtained from: Patient Chief Complaint Left Gluteal Pressure Ulcer Electronic Signature(s) Signed: 03/08/2019 3:17:21 PM By: Worthy Keeler PA-C Entered By: Worthy Keeler on 03/08/2019 15:17:21 Canale, Karl Stephens ([id number]) -------------------------------------------------------------------------------- Debridement Details Patient Name: [name] Date of Service: 03/08/2019 2:15 PM Medical Record Number: [id number] Patient Account Number: [id number] Date of Birth/Sex: 04-20-1956 (63 y.o. M) Treating RN: Montey Hora Primary Care Provider: Raelyn Ensign Other Clinician: Referring Provider: Referral, Self Treating Provider/Extender: STONE III, HOYT Weeks in Treatment: 0 Debridement Performed for Wound #1 Left Gluteus Assessment: Performed By: Physician STONE III, HOYT E., PA-C Debridement Type: Chemical/Enzymatic/Mechanical Agent Used: gauze and saline Level of Consciousness (Pre- Awake and Alert procedure): Pre-procedure Verification/Time Yes - 15:21 Out Taken: Start Time: 15:21 Pain Control: Lidocaine 4% Topical Solution Instrument: Other : gauze and saline Bleeding: None End Time: 15:22 Procedural Pain: 0 Post Procedural Pain: 0 Response to Treatment: Procedure was tolerated well Level of Consciousness Awake and Alert (Post-procedure): Post Debridement Measurements of Total Wound Length: (cm) 1.2 Stage: Category/Stage III Width: (cm) 2 Depth: (cm) 0.2 Volume: (cm) 0.377 Character of Wound/Ulcer  Post Improved Debridement: Post Procedure Diagnosis Same as Pre-procedure Electronic Signature(s) Signed: 03/08/2019 4:02:04 PM By: Montey Hora Signed: 03/10/2019 6:59:38 PM By: Worthy Keeler PA-C Entered By: Montey Hora on 03/08/2019 15:22:16 Fier, Karl Stephens ([id number]) -------------------------------------------------------------------------------- HPI Details Patient Name: [name] Date of Service: 03/08/2019 2:15 PM Medical Record Number: [id number] Patient Account Number: [id number] Date of Birth/Sex: [birth year] (63 y.o. M) Treating RN: Montey Hora Primary Care Provider: Raelyn Ensign Other Clinician: Referring Provider: Referral, Self Treating Provider/Extender: Melburn Hake, HOYT Weeks in Treatment: 0 History of Present Illness HPI Description: 03/08/2019 on evaluation today patient actually appears to be doing overall somewhat poorly with regard to his wound in the left gluteal region. Unfortunately he has been in 2 different skilled nursing facilities where he unfortunately did not have the amount of care that sounds like he needed with regard to this pressure ulcer. He has been tolerating the dressing changes without complication. Fortunately he does not seem to have any signs of infection but unfortunately there really at a loss for exactly what to do. He was admitted to the hospital and subsequently the nursing facilities due to severe major depressive disorder he is actually undergone electroshock therapy. He does seem to be very flat as far as his affect is concerned but nonetheless seems to be a very pleasant individual. He is seen with his wife during the office visit today. He does tell me that he spends most of his day sitting on the couch watching TV. I think the prolonged sitting may be causing some complications and problems at this point as well. Obviously that is definitely of concern. How to counter this will be discussed further in the  plan. Electronic Signature(s) Signed: 03/08/2019 4:48:47 PM By: Worthy Keeler PA-C Entered By: Worthy Keeler on 03/08/2019 16:48:47 Roderick, Karl Stephens ([id number]) -------------------------------------------------------------------------------- Physical Exam Details Patient Name: [name] Date of Service: 03/08/2019 2:15 PM Medical Record Number: [id number] Patient Account Number: [id number] Date of Birth/Sex: 1956/03/07 (63 y.o. M) Treating RN:  Montey Hora Primary Care Provider: Raelyn Ensign Other Clinician: Referring Provider: Referral, Self Treating Provider/Extender: STONE III, HOYT Weeks in Treatment: 0 Constitutional sitting or standing blood pressure is within target range for patient.. pulse regular and within target range for patient.[name] respirations regular, non-labored and within target range for patient.[name] temperature within target range for patient.. Well- nourished and well-hydrated in no acute distress. Eyes conjunctiva clear no eyelid edema noted. pupils equal round and reactive to light and accommodation. Ears, Nose, Mouth, and Throat no gross abnormality of ear auricles or external auditory canals. normal hearing noted during conversation. mucus membranes moist. Respiratory normal breathing without difficulty. clear to auscultation bilaterally. Cardiovascular regular rate and rhythm with normal S1, S2. no clubbing, cyanosis, significant edema, <3 sec cap refill. Gastrointestinal (GI) soft, non-tender, non-distended, +BS. no ventral hernia noted. Musculoskeletal stooped posture. no significant deformity or arthritic changes, no loss or range of motion, no clubbing. Psychiatric this patient is able to make decisions and demonstrates good insight into disease process. Alert and Oriented x 3. pleasant and cooperative. Notes In regard to patient's wound the left gluteal region is the only place where he has an open ulcer although he does have blanchable  erythema noted over the right gluteal region as well. He does not seem to have significant pain which is good news with that being said he does have some discomfort when I cleaned over the area. Fortunately the wound is not too deep at all which is good news. Electronic Signature(s) Signed: 03/08/2019 4:49:29 PM By: Worthy Keeler PA-C Entered By: Worthy Keeler on 03/08/2019 16:49:28 Wickham, Karl Stephens ([id number]) -------------------------------------------------------------------------------- Physician Orders Details Patient Name: [name] Date of Service: 03/08/2019 2:15 PM Medical Record Number: [id number] Patient Account Number: [id number] Date of Birth/Sex: 1955-12-17 (63 y.o. M) Treating RN: Montey Hora Primary Care Provider: Raelyn Ensign Other Clinician: Referring Provider: Referral, Self Treating Provider/Extender: Melburn Hake, HOYT Weeks in Treatment: 0 Verbal / Phone Orders: No Diagnosis Coding ICD-10 Coding Code Description L89.323 Pressure ulcer of left buttock, stage 3 F33.2 Major depressive disorder, recurrent severe without psychotic features Wound Cleansing Wound #1 Left Gluteus o Dial antibacterial soap, wash wounds, rinse and pat dry prior to dressing wounds o May Shower, gently pat wound dry prior to applying new dressing. Primary Wound Dressing Wound #1 Left Gluteus o Silver Alginate Secondary Dressing Wound #1 Left Gluteus o Boardered Foam Dressing Dressing Change Frequency Wound #1 Left Gluteus o Change dressing every day. Follow-up Appointments Wound #1 Left Gluteus o Return Appointment in 2 weeks. Off-Loading Wound #1 Left Gluteus o Turn and reposition every 2 hours - Do not sit in the same position constantly o Other: - Walk for at least 5 minutes for every hour that you are sitting to help with pressure relief Home Health Wound #1 Left [place] for [place] Nurse may visit  PRN to address patientos wound care needs. o FACE TO FACE ENCOUNTER: MEDICARE and MEDICAID PATIENTS: I certify that this patient is under my care and that I had a face-to-face encounter that meets the physician face-to-face encounter requirements with this patient on this date. The encounter with the patient was in whole or in part for the following MEDICAL CONDITION: (primary reason for [place]) MEDICAL NECESSITY: I certify, that based on my findings, NURSING services are a medically necessary home health service. HOME BOUND STATUS: I certify that my clinical findings support that this patient is homebound (i.e., Due  to illness or injury, pt requires aid of supportive devices such as crutches, cane, wheelchairs, walkers, the use of special transportation or the Prettyman, TISHAWN SCHAEFER. ([id number]) assistance of another person to leave their place of residence. There is a normal inability to leave the home and doing so requires considerable and taxing effort. Other absences are for medical reasons / religious services and are infrequent or of short duration when for other reasons). o If current dressing causes regression in wound condition, may D/C ordered dressing product/s and apply Normal Saline Moist Dressing daily until next [place] / Other MD appointment. [place] of regression in wound condition at [phone]. o Please direct any NON-WOUND related issues/requests for orders to patient's Primary Care Physician Electronic Signature(s) Signed: 03/08/2019 4:02:04 PM By: Montey Hora Signed: 03/10/2019 6:59:38 PM By: Worthy Keeler PA-C Entered By: Montey Hora on 03/08/2019 15:33:56 Stagner, Karl Stephens ([id number]) -------------------------------------------------------------------------------- Problem List Details Patient Name: [name] Date of Service: 03/08/2019 2:15 PM Medical Record Number: [id number] Patient Account Number:  [id number] Date of Birth/Sex: 18-Dec-1955 (63 y.o. M) Treating RN: Montey Hora Primary Care Provider: Raelyn Ensign Other Clinician: Referring Provider: Referral, Self Treating Provider/Extender: Melburn Hake, HOYT Weeks in Treatment: 0 Active Problems ICD-10 Evaluated Encounter Code Description Active Date Today Diagnosis L89.323 Pressure ulcer of left buttock, stage 3 03/08/2019 No Yes F33.2 Major depressive disorder, recurrent severe without psychotic 03/08/2019 No Yes features Inactive Problems Resolved Problems Electronic Signature(s) Signed: 03/08/2019 3:17:01 PM By: Worthy Keeler PA-C Entered By: Worthy Keeler on 03/08/2019 15:17:01 Zuercher, Karl Stephens ([id number]) -------------------------------------------------------------------------------- Progress Note Details Patient Name: [name] Date of Service: 03/08/2019 2:15 PM Medical Record Number: [id number] Patient Account Number: [id number] Date of Birth/Sex: 11-01-1955 (63 y.o. M) Treating RN: Montey Hora Primary Care Provider: Raelyn Ensign Other Clinician: Referring Provider: Referral, Self Treating Provider/Extender: Melburn Hake, HOYT Weeks in Treatment: 0 Subjective Chief Complaint Information obtained from Patient Left Gluteal Pressure Ulcer History of Present Illness (HPI) 03/08/2019 on evaluation today patient actually appears to be doing overall somewhat poorly with regard to his wound in the left gluteal region. Unfortunately he has been in 2 different skilled nursing facilities where he unfortunately did not have the amount of care that sounds like he needed with regard to this pressure ulcer. He has been tolerating the dressing changes without complication. Fortunately he does not seem to have any signs of infection but unfortunately there really at a loss for exactly what to do. He was admitted to the hospital and subsequently the nursing facilities due to severe major depressive disorder he is actually  undergone electroshock therapy. He does seem to be very flat as far as his affect is concerned but nonetheless seems to be a very pleasant individual. He is seen with his wife during the office visit today. He does tell me that he spends most of his day sitting on the couch watching TV. I think the prolonged sitting may be causing some complications and problems at this point as well. Obviously that is definitely of concern. How to counter this will be discussed further in the plan. Patient History Information obtained from Patient. Allergies No Known Allergies Family History No family history of Cancer, Diabetes, Heart Disease, Hereditary Spherocytosis, Hypertension, Kidney Disease, Lung Disease, Seizures, Stroke, Thyroid Problems, Tuberculosis. Social History Never smoker, Marital Status - Married, Alcohol Use - Never, Drug Use - No History, Caffeine Use - Never. Medical History Eyes Denies history of Cataracts, Glaucoma,  Optic Neuritis Ear/Nose/Mouth/Throat Denies history of Chronic sinus problems/congestion, Middle ear problems Hematologic/Lymphatic Denies history of Anemia, Hemophilia, Human Immunodeficiency Virus, Lymphedema, Sickle Cell Disease Respiratory Denies history of Aspiration, Asthma, Chronic Obstructive Pulmonary Disease (COPD), Pneumothorax, Sleep Apnea, Tuberculosis Cardiovascular Denies history of Angina, Arrhythmia, Congestive Heart Failure, Coronary Artery Disease, Deep Vein Thrombosis, Hypertension, Hypotension, Myocardial Infarction, Peripheral Arterial Disease, Peripheral Venous Disease, Phlebitis, Vasculitis Gastrointestinal Pearcy, Shriyan W. ([id number]) Denies history of Cirrhosis , Colitis, Crohn s, Hepatitis A, Hepatitis B, Hepatitis C Endocrine Denies history of Type I Diabetes, Type II Diabetes Genitourinary Denies history of End Stage Renal Disease Immunological Denies history of Lupus Erythematosus, Raynaud s, Scleroderma Integumentary  (Skin) Denies history of History of Burn, History of pressure wounds Musculoskeletal Denies history of Gout, Rheumatoid Arthritis, Osteoarthritis, Osteomyelitis Neurologic Denies history of Dementia, Neuropathy, Quadriplegia, Paraplegia, Seizure Disorder Oncologic Denies history of Received Chemotherapy, Received Radiation Psychiatric Denies history of Anorexia/bulimia, Confinement Anxiety Hospitalization/Surgery History - [facility]. Review of Systems (ROS) Constitutional Symptoms (General Health) Denies complaints or symptoms of Fatigue, Fever, Chills, Marked Weight Change. Eyes Complains or has symptoms of Glasses / Contacts - glasses. Denies complaints or symptoms of Dry Eyes, Vision Changes. Ear/Nose/Mouth/Throat Denies complaints or symptoms of Difficult clearing ears, Sinusitis. Hematologic/Lymphatic Denies complaints or symptoms of Bleeding / Clotting Disorders, Human Immunodeficiency Virus. Respiratory Denies complaints or symptoms of Chronic or frequent coughs, Shortness of Breath. Cardiovascular Denies complaints or symptoms of Chest pain, LE edema. Gastrointestinal Denies complaints or symptoms of Frequent diarrhea, Nausea, Vomiting. Endocrine Denies complaints or symptoms of Hepatitis, Thyroid disease, Polydypsia (Excessive Thirst). Genitourinary Denies complaints or symptoms of Kidney failure/ Dialysis, Incontinence/dribbling. Immunological Denies complaints or symptoms of Hives, Itching. Integumentary (Skin) Denies complaints or symptoms of Wounds, Bleeding or bruising tendency, Breakdown, Swelling. Musculoskeletal Denies complaints or symptoms of Muscle Pain, Muscle Weakness. Neurologic Denies complaints or symptoms of Numbness/parasthesias, Focal/Weakness. Psychiatric Denies complaints or symptoms of Anxiety, Claustrophobia. [place], JURRELL KINTNER. ([id number]) Objective Constitutional sitting or standing blood pressure is within target range for patient.. pulse  regular and within target range for patient.[name] respirations regular, non-labored and within target range for patient.[name] temperature within target range for patient.. Well- nourished and well-hydrated in no acute distress. Vitals Time Taken: 2:36 PM, Height: 70 in, Source: Stated, Weight: 152 lbs, Source: Stated, BMI: 21.8, Temperature: 98.6 F, Pulse: 75 bpm, Respiratory Rate: 16 breaths/min, Blood Pressure: 122/68 mmHg. Eyes conjunctiva clear no eyelid edema noted. pupils equal round and reactive to light and accommodation. Ears, Nose, Mouth, and Throat no gross abnormality of ear auricles or external auditory canals. normal hearing noted during conversation. mucus membranes moist. Respiratory normal breathing without difficulty. clear to auscultation bilaterally. Cardiovascular regular rate and rhythm with normal S1, S2. no clubbing, cyanosis, significant edema, Gastrointestinal (GI) soft, non-tender, non-distended, +BS. no ventral hernia noted. Musculoskeletal stooped posture. no significant deformity or arthritic changes, no loss or range of motion, no clubbing. Psychiatric this patient is able to make decisions and demonstrates good insight into disease process. Alert and Oriented x 3. pleasant and cooperative. General Notes: In regard to patient's wound the left gluteal region is the only place where he has an open ulcer although he does have blanchable erythema noted over the right gluteal region as well. He does not seem to have significant pain which is good news with that being said he does have some discomfort when I cleaned over the area. Fortunately the wound is not too deep at all which is good news. Integumentary (Hair, Skin) Wound #  1 status is Open. Original cause of wound was Pressure Injury. The wound is located on the Left Gluteus. The wound measures 1.2cm length x 2cm width x 0.2cm depth; 1.885cm^2 area and 0.377cm^3 volume. There is Fat Layer (Subcutaneous Tissue)  Exposed exposed. There is no tunneling or undermining noted. There is a medium amount of serosanguineous drainage noted. There is large (67-100%) red granulation within the wound bed. There is a small (1-33%) amount of necrotic tissue within the wound bed including Adherent Slough. Assessment Active Problems ICD-10 Pressure ulcer of left buttock, stage 3 Major depressive disorder, recurrent severe without psychotic features Mackley, Okley W. ([id number]) Procedures Wound #1 Pre-procedure diagnosis of Wound #1 is a Pressure Ulcer located on the Left Gluteus . There was a Chemical/Enzymatic/Mechanical debridement performed by STONE III, HOYT E., PA-C. With the following instrument(s): gauze and saline after achieving pain control using Lidocaine 4% Topical Solution. Other agent used was gauze and saline. A time out was conducted at 15:21, prior to the start of the procedure. There was no bleeding. The procedure was tolerated well with a pain level of 0 throughout and a pain level of 0 following the procedure. Post Debridement Measurements: 1.2cm length x 2cm width x 0.2cm depth; 0.377cm^3 volume. Post debridement Stage noted as Category/Stage III. Character of Wound/Ulcer Post Debridement is improved. Post procedure Diagnosis Wound #1: Same as Pre-Procedure Plan Wound Cleansing: Wound #1 Left Gluteus: Dial antibacterial soap, wash wounds, rinse and pat dry prior to dressing wounds May Shower, gently pat wound dry prior to applying new dressing. Primary Wound Dressing: Wound #1 Left Gluteus: Silver Alginate Secondary Dressing: Wound #1 Left Gluteus: Boardered Foam Dressing Dressing Change Frequency: Wound #1 Left Gluteus: Change dressing every day. Follow-up Appointments: Wound #1 Left Gluteus: Return Appointment in 2 weeks. Off-Loading: Wound #1 Left Gluteus: Turn and reposition every 2 hours - Do not sit in the same position constantly Other: - Walk for at least 5 minutes for  every hour that you are sitting to help with pressure relief Home Health: Wound #1 Left Gluteus: [place] for [place] Nurse may visit PRN to address patient s wound care needs. FACE TO FACE ENCOUNTER: MEDICARE and MEDICAID PATIENTS: I certify that this patient is under my care and that I had a face-to-face encounter that meets the physician face-to-face encounter requirements with this patient on this date. The encounter with the patient was in whole or in part for the following MEDICAL CONDITION: (primary reason for [place]) MEDICAL NECESSITY: I certify, that based on my findings, NURSING services are a medically necessary home health service. HOME BOUND STATUS: I certify that my clinical findings support that this patient is homebound (i.e., Due to illness or injury, pt requires aid of supportive devices such as crutches, cane, wheelchairs, walkers, the use of special transportation or the assistance of another person to leave their place of residence. There is a normal inability to leave the home and doing so requires considerable and taxing effort. Other absences are for medical reasons / religious services and are infrequent or of short duration when for other reasons). Strohecker, TOMAS HEATON. ([id number]) If current dressing causes regression in wound condition, may D/C ordered dressing product/s and apply Normal Saline Moist Dressing daily until next [place] / Other MD appointment. [place] of regression in wound condition at [phone]. Please direct any NON-WOUND related issues/requests for orders to patient's Primary Care Physician 1. My suggestion at this time is  good to be that we go ahead and initiate treatment with a silver alginate dressing as I think this will do well to help with the moisture and maceration that we see around the wound. He is in agreement with that plan. 2. I am in a suggest as well that he needs  to appropriately offload I suggested that he get up and walk around at least 5 minutes every hour. When he sits back down I want him to find a different position on the couch not just sitting but possibly lying on one hip and then lying on the other the next time he gets up to move around. This will prevent excess damage to the tissue around the gluteal region and hopefully allow the wound to heal more appropriately and quickly. 3. I am also going to suggest a border foam dressing to cover and a protective dressing for the right gluteal region in order to prevent any further breakdown. 4. We are again initiate home health for the patient as well I think this will definitely benefit him from the standpoint of having a nurse to come in and help with dressing changes. We will see patient back for reevaluation in 1 week here in the clinic. If anything worsens or changes patient will contact our office for additional recommendations. Electronic Signature(s) Signed: 03/08/2019 4:50:49 PM By: Worthy Keeler PA-C Entered By: Worthy Keeler on 03/08/2019 16:50:49 Velazco, Karl Stephens ([id number]) -------------------------------------------------------------------------------- ROS/PFSH Details Patient Name: [name] Date of Service: 03/08/2019 2:15 PM Medical Record Number: [id number] Patient Account Number: [id number] Date of Birth/Sex: 1955-11-22 (63 y.o. M) Treating RN: Army Melia Primary Care Provider: Raelyn Ensign Other Clinician: Referring Provider: Referral, Self Treating Provider/Extender: STONE III, HOYT Weeks in Treatment: 0 Information Obtained From Patient Constitutional Symptoms (General Health) Complaints and Symptoms: Negative for: Fatigue; Fever; Chills; Marked Weight Change Eyes Complaints and Symptoms: Positive for: Glasses / Contacts - glasses Negative for: Dry Eyes; Vision Changes Medical History: Negative for: Cataracts; Glaucoma; Optic  Neuritis Ear/Nose/Mouth/Throat Complaints and Symptoms: Negative for: Difficult clearing ears; Sinusitis Medical History: Negative for: Chronic sinus problems/congestion; Middle ear problems Hematologic/Lymphatic Complaints and Symptoms: Negative for: Bleeding / Clotting Disorders; Human Immunodeficiency Virus Medical History: Negative for: Anemia; Hemophilia; Human Immunodeficiency Virus; Lymphedema; Sickle Cell Disease Respiratory Complaints and Symptoms: Negative for: Chronic or frequent coughs; Shortness of Breath Medical History: Negative for: Aspiration; Asthma; Chronic Obstructive Pulmonary Disease (COPD); Pneumothorax; Sleep Apnea; Tuberculosis Cardiovascular Complaints and Symptoms: Negative for: Chest pain; LE edema Medical History: Negative for: Angina; Arrhythmia; Congestive Heart Failure; Coronary Artery Disease; Deep Vein Thrombosis; Hypertension; Hypotension; Myocardial Infarction; Peripheral Arterial Disease; Peripheral Venous Disease; Phlebitis; Kunin, Coleby W. ([id number]) Vasculitis Gastrointestinal Complaints and Symptoms: Negative for: Frequent diarrhea; Nausea; Vomiting Medical History: Negative for: Cirrhosis ; Colitis; Crohnos; Hepatitis A; Hepatitis B; Hepatitis C Endocrine Complaints and Symptoms: Negative for: Hepatitis; Thyroid disease; Polydypsia (Excessive Thirst) Medical History: Negative for: Type I Diabetes; Type II Diabetes Genitourinary Complaints and Symptoms: Negative for: Kidney failure/ Dialysis; Incontinence/dribbling Medical History: Negative for: End Stage Renal Disease Immunological Complaints and Symptoms: Negative for: Hives; Itching Medical History: Negative for: Lupus Erythematosus; Raynaudos; Scleroderma Integumentary (Skin) Complaints and Symptoms: Negative for: Wounds; Bleeding or bruising tendency; Breakdown; Swelling Medical History: Negative for: History of Burn; History of pressure  wounds Musculoskeletal Complaints and Symptoms: Negative for: Muscle Pain; Muscle Weakness Medical History: Negative for: Gout; Rheumatoid Arthritis; Osteoarthritis; Osteomyelitis Neurologic Complaints and Symptoms: Negative for: Numbness/parasthesias; Focal/Weakness Medical History: Negative for: Dementia; Neuropathy;  Quadriplegia; Paraplegia; Seizure Disorder Psychiatric Nies, AOI FREHNER. ([id number]) Complaints and Symptoms: Negative for: Anxiety; Claustrophobia Medical History: Negative for: Anorexia/bulimia; Confinement Anxiety Oncologic Medical History: Negative for: Received Chemotherapy; Received Radiation Immunizations Pneumococcal Vaccine: Received Pneumococcal Vaccination: No Implantable Devices None Hospitalization / Surgery History Type of Hospitalization/Surgery [facility] Family and Social History Cancer: No; Diabetes: No; Heart Disease: No; Hereditary Spherocytosis: No; Hypertension: No; Kidney Disease: No; Lung Disease: No; Seizures: No; Stroke: No; Thyroid Problems: No; Tuberculosis: No; Never smoker; Marital Status - Married; Alcohol Use: Never; Drug Use: No History; Caffeine Use: Never; Financial Concerns: No; Food, Clothing or Shelter Needs: No; Support System Lacking: No; Transportation Concerns: No Electronic Signature(s) Signed: 03/08/2019 3:12:16 PM By: Army Melia Signed: 03/10/2019 6:59:38 PM By: Worthy Keeler PA-C Entered By: Army Melia on 03/08/2019 14:51:13 Strohm, Karl Stephens ([id number]) -------------------------------------------------------------------------------- SuperBill Details Patient Name: [name] Date of Service: 03/08/2019 Medical Record Number: [id number] Patient Account Number: [id number] Date of Birth/Sex: [birth year] (63 y.o. 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

## 2019-03-22 ENCOUNTER — Ambulatory Visit: Payer: BLUE CROSS/BLUE SHIELD | Admitting: Physician Assistant

## 2019-03-26 ENCOUNTER — Encounter: Payer: BLUE CROSS/BLUE SHIELD | Attending: Physician Assistant | Admitting: Physician Assistant

## 2019-03-26 ENCOUNTER — Other Ambulatory Visit: Payer: Self-pay

## 2019-03-26 DIAGNOSIS — F332 Major depressive disorder, recurrent severe without psychotic features: Secondary | ICD-10-CM | POA: Diagnosis not present

## 2019-03-26 DIAGNOSIS — L89323 Pressure ulcer of left buttock, stage 3: Secondary | ICD-10-CM | POA: Diagnosis present

## 2019-03-26 DIAGNOSIS — I10 Essential (primary) hypertension: Secondary | ICD-10-CM | POA: Diagnosis not present

## 2019-03-26 NOTE — Progress Notes (Addendum)
NAYTHEN, FRARY ([id number]) Visit Report for 03/26/2019 Arrival Information Details Patient Name: [name], [name]. Date of Service: 03/26/2019 2:45 PM Medical Record Number: [id number] Patient Account Number: [id number] Date of Birth/Sex: [birth year] (63 y.o. M) Treating RN: Army Melia Primary Care Hillary Schwegler: Raelyn Ensign Other Clinician: Referring Kaleyah Labreck: Raelyn Ensign Treating Ajit Errico/Extender: Melburn Hake, HOYT Weeks in Treatment: 2 Visit Information History Since Last Visit Added or deleted any medications: No Patient Arrived: Ambulatory Any new allergies or adverse reactions: No Arrival Time: 14:39 Had a fall or experienced change in No Accompanied By: wife activities of daily living that may affect Transfer Assistance: None risk of falls: Patient Identification Verified: Yes Signs or symptoms of abuse/neglect since last visito No Patient Requires Transmission-Based No Hospitalized since last visit: No Precautions: Has Dressing in Place as Prescribed: Yes Patient Has Alerts: No Pain Present Now: No Electronic Signature(s) Signed: 03/26/2019 2:46:54 PM By: Army Melia Entered By: Army Melia on 03/26/2019 14:39:59 Metivier, [name] ([id number]) -------------------------------------------------------------------------------- Clinic Level of Care Assessment Details Patient Name: Trinidad Curet Date of Service: 03/26/2019 2:45 PM Medical Record Number: [id number] Patient Account Number: [id number] Date of Birth/Sex: [birth year] (63 y.o. M) Treating RN: Montey Hora Primary Care Laurianne Floresca: Raelyn Ensign Other Clinician: Referring Kaan Tosh: Raelyn Ensign Treating Keigan Girten/Extender: Melburn Hake, HOYT Weeks in Treatment: 2 Clinic Level of Care Assessment Items TOOL 4 Quantity Score []  - Use when only an EandM is performed on FOLLOW-UP visit 0 ASSESSMENTS - Nursing Assessment / Reassessment X - Reassessment of Co-morbidities (includes updates in patient status) 1 10 X- 1  5 Reassessment of Adherence to Treatment Plan ASSESSMENTS - Wound and Skin Assessment / Reassessment X - Simple Wound Assessment / Reassessment - one wound 1 5 []  - 0 Complex Wound Assessment / Reassessment - multiple wounds []  - 0 Dermatologic / Skin Assessment (not related to wound area) ASSESSMENTS - Focused Assessment []  - Circumferential Edema Measurements - multi extremities 0 []  - 0 Nutritional Assessment / Counseling / Intervention []  - 0 Lower Extremity Assessment (monofilament, tuning fork, pulses) []  - 0 Peripheral Arterial Disease Assessment (using hand held doppler) ASSESSMENTS - Ostomy and/or Continence Assessment and Care []  - Incontinence Assessment and Management 0 []  - 0 Ostomy Care Assessment and Management (repouching, etc.) PROCESS - Coordination of Care X - Simple Patient / Family Education for ongoing care 1 15 []  - 0 Complex (extensive) Patient / Family Education for ongoing care X- 1 10 Staff obtains Programmer, systems, Records, Test Results / Process Orders []  - 0 Staff telephones HHA, Nursing Homes / Clarify orders / etc []  - 0 Routine Transfer to another Facility (non-emergent condition) []  - 0 Routine Hospital Admission (non-emergent condition) []  - 0 New Admissions / Biomedical engineer / Ordering NPWT, Apligraf, etc. []  - 0 Emergency Hospital Admission (emergent condition) X- 1 10 Simple Discharge Coordination Jakubek, Jermichael W. ([id number]) []  - 0 Complex (extensive) Discharge Coordination PROCESS - Special Needs []  - Pediatric / Minor Patient Management 0 []  - 0 Isolation Patient Management []  - 0 Hearing / Language / Visual special needs []  - 0 Assessment of Community assistance (transportation, D/C planning, etc.) []  - 0 Additional assistance / Altered mentation []  - 0 Support Surface(s) Assessment (bed, cushion, seat, etc.) INTERVENTIONS - Wound Cleansing / Measurement X - Simple Wound Cleansing - one wound 1 5 []  - 0 Complex Wound  Cleansing - multiple wounds X- 1 5 Wound Imaging (photographs - any number of wounds) []  - 0 Wound Tracing (instead of photographs) X-  1 5 Simple Wound Measurement - one wound []  - 0 Complex Wound Measurement - multiple wounds INTERVENTIONS - Wound Dressings X - Small Wound Dressing one or multiple wounds 1 10 []  - 0 Medium Wound Dressing one or multiple wounds []  - 0 Large Wound Dressing one or multiple wounds []  - 0 Application of Medications - topical []  - 0 Application of Medications - injection INTERVENTIONS - Miscellaneous []  - External ear exam 0 []  - 0 Specimen Collection (cultures, biopsies, blood, body fluids, etc.) []  - 0 Specimen(s) / Culture(s) sent or taken to Lab for analysis []  - 0 Patient Transfer (multiple staff / Civil Service fast streamer / Similar devices) []  - 0 Simple Staple / Suture removal (25 or less) []  - 0 Complex Staple / Suture removal (26 or more) []  - 0 Hypo / Hyperglycemic Management (close monitor of Blood Glucose) []  - 0 Ankle / Brachial Index (ABI) - do not check if billed separately X- 1 5 Vital Signs Younis, Khadeem W. ([id number]) Has the patient been seen at the hospital within the last three years: Yes Total Score: 85 Level Of Care: New/Established - Level 3 Electronic Signature(s) Signed: 03/27/2019 4:36:54 PM By: Montey Hora Entered By: Montey Hora on 03/27/2019 15:06:58 Whiteford, [name] ([id number]) -------------------------------------------------------------------------------- Lower Extremity Assessment Details Patient Name: [name] Date of Service: 03/26/2019 2:45 PM Medical Record Number: [id number] Patient Account Number: [id number] Date of Birth/Sex: 05-01-1956 (63 y.o. M) Treating RN: Army Melia Primary Care Jaice Digioia: Raelyn Ensign Other Clinician: Referring Estephania Licciardi: Raelyn Ensign Treating Kinley Dozier/Extender: Melburn Hake, HOYT Weeks in Treatment: 2 Electronic Signature(s) Signed: 03/26/2019 2:46:54 PM By: Army Melia Entered By: Army Melia on 03/26/2019 14:45:12 Mcloud, [name] ([id number]) -------------------------------------------------------------------------------- Multi Wound Chart Details Patient Name: [name] Date of Service: 03/26/2019 2:45 PM Medical Record Number: [id number] Patient Account Number: [id number] Date of Birth/Sex: 1956/04/30 (63 y.o. M) Treating RN: Montey Hora Primary Care Brelynn Wheller: Raelyn Ensign Other Clinician: Referring Jalaine Riggenbach: Raelyn Ensign Treating Liel Rudden/Extender: Melburn Hake, HOYT Weeks in Treatment: 2 Vital Signs Height(in): 70 Pulse(bpm): 79 Weight(lbs): 152 Blood Pressure(mmHg): 161/92 Body Mass Index(BMI): 22 Temperature(F): 98.4 Respiratory Rate 16 (breaths/min): Photos: [N/A:N/A] Wound Location: Left Gluteus N/A N/A Wounding Event: Pressure Injury N/A N/A Primary Etiology: Pressure Ulcer N/A N/A Date Acquired: 12/10/2018 N/A N/A Weeks of Treatment: 2 N/A N/A Wound Status: Open N/A N/A Measurements L x W x D 0.9x1.1x0.1 N/A N/A (cm) Area (cm) : 0.778 N/A N/A Volume (cm) : 0.078 N/A N/A % Reduction in Area: 58.70% N/A N/A % Reduction in Volume: 79.30% N/A N/A Classification: Category/Stage III N/A N/A Exudate Amount: Medium N/A N/A Exudate Type: Serosanguineous N/A N/A Exudate Color: red, brown N/A N/A Granulation Amount: Large (67-100%) N/A N/A Granulation Quality: Red N/A N/A Necrotic Amount: Small (1-33%) N/A N/A Exposed Structures: Fat Layer (Subcutaneous N/A N/A Tissue) Exposed: Yes Fascia: No Tendon: No Muscle: No Joint: No Bone: No Epithelialization: None N/A N/A Treatment Notes Lacivita, NAPOLEON YERENA ([id number]) Electronic Signature(s) Signed: 03/26/2019 3:57:57 PM By: Montey Hora Entered By: Montey Hora on 03/26/2019 15:24:55 Lynn, [name] ([id number]) -------------------------------------------------------------------------------- Multi-Disciplinary Care Plan Details Patient Name: Trinidad Curet Date of Service: 03/26/2019 2:45 PM Medical Record Number: [id number] Patient Account Number: [id number] Date of Birth/Sex: 1956/04/27 (63 y.o. M) Treating RN: Montey Hora Primary Care Ikesha Siller: Raelyn Ensign Other Clinician: Referring Allante Beane: Raelyn Ensign Treating Estefanny Moler/Extender: Sharalyn Ink in Treatment: 2 Active Inactive Electronic Signature(s) Signed: 04/29/2019 5:05:17 PM By: Gretta Cool, BSN, RN, CWS, Kim RN, BSN  Signed: 05/29/2019 4:48:32 PM By: Montey Hora Previous Signature: 03/26/2019 3:57:57 PM Version By: Montey Hora Entered By: Gretta Cool BSN, RN, CWS, Kim on 04/29/2019 17:05:17 Boothe, [name] ([id number]) -------------------------------------------------------------------------------- Pain Assessment Details Patient Name: [name] Date of Service: 03/26/2019 2:45 PM Medical Record Number: [id number] Patient Account Number: [id number] Date of Birth/Sex: 1955/12/03 (63 y.o. M) Treating RN: Army Melia Primary Care Shalaya Swailes: Raelyn Ensign Other Clinician: Referring Jandi Swiger: Raelyn Ensign Treating Zechariah Bissonnette/Extender: Melburn Hake, HOYT Weeks in Treatment: 2 Active Problems Location of Pain Severity and Description of Pain Patient Has Paino No Site Locations Pain Management and Medication Current Pain Management: Electronic Signature(s) Signed: 03/26/2019 2:46:54 PM By: Army Melia Entered By: Army Melia on 03/26/2019 14:40:05 [place]kiff, [name] ([id number]) -------------------------------------------------------------------------------- Patient/Caregiver Education Details Patient Name: Trinidad Curet Date of Service: 03/26/2019 2:45 PM Medical Record Number: [id number] Patient Account Number: [id number] Date of Birth/Gender: 20-May-1956 (63 y.o. M) Treating RN: Montey Hora Primary Care Physician: Raelyn Ensign Other Clinician: Referring Physician: Raelyn Ensign Treating Physician/Extender: Sharalyn Ink in Treatment: 2 Education  Assessment Education Provided To: Patient and Caregiver Education Topics Provided Wound/Skin Impairment: Handouts: Other: wound care as ordered Methods: Demonstration, Explain/Verbal Responses: State content correctly Electronic Signature(s) Signed: 03/27/2019 4:36:54 PM By: Montey Hora Entered By: Montey Hora on 03/27/2019 15:07:28 Passon, [name] ([id number]) -------------------------------------------------------------------------------- Wound Assessment Details Patient Name: [name] Date of Service: 03/26/2019 2:45 PM Medical Record Number: [id number] Patient Account Number: [id number] Date of Birth/Sex: [birth year] (63 y.o. M) Treating RN: Army Melia Primary Care Ellason Segar: Raelyn Ensign Other Clinician: Referring Kameo Bains: Raelyn Ensign Treating Ersilia Brawley/Extender: Melburn Hake, HOYT Weeks in Treatment: 2 Wound Status Wound Number: 1 Primary Etiology: Pressure Ulcer Wound Location: Left Gluteus Wound Status: Open Wounding Event: Pressure Injury Date Acquired: 12/10/2018 Weeks Of Treatment: 2 Clustered Wound: No Photos Wound Measurements Length: (cm) 0.9 % Reduction in Width: (cm) 1.1 % Reduction in Depth: (cm) 0.1 Epithelializat Area: (cm) 0.778 Tunneling: Volume: (cm) 0.078 Undermining: Area: 58.7% Volume: 79.3% ion: None No No Wound Description Classification: Category/Stage III Foul Odor Afte Exudate Amount: Medium Slough/Fibrino Exudate Type: Serosanguineous Exudate Color: red, brown r Cleansing: No Yes Wound Bed Granulation Amount: Large (67-100%) Exposed Structure Granulation Quality: Red Fascia Exposed: No Necrotic Amount: Small (1-33%) Fat Layer (Subcutaneous Tissue) Exposed: Yes Necrotic Quality: Adherent Slough Tendon Exposed: No Muscle Exposed: No Joint Exposed: No Bone Exposed: No Electronic Signature(s) Signed: 03/26/2019 2:46:54 PM By: Geraldine Contras ([id number]) Entered By: Army Melia on 03/26/2019  14:44:37 Ostrand, [name] ([id number]) -------------------------------------------------------------------------------- Vitals Details Patient Name: [name] Date of Service: 03/26/2019 2:45 PM Medical Record Number: [id number] Patient Account Number: [id number] Date of Birth/Sex: [birth year] (63 y.o. M) Treating RN: Army Melia Primary Care Diarra Ceja: Raelyn Ensign Other Clinician: Referring Mackay Hanauer: Raelyn Ensign Treating Sameera Betton/Extender: Melburn Hake, HOYT Weeks in Treatment: 2 Vital Signs Time Taken: 14:40 Temperature (F): 98.4 Height (in): 70 Pulse (bpm): 79 Weight (lbs): 152 Respiratory Rate (breaths/min): 16 Body Mass Index (BMI): 21.8 Blood Pressure (mmHg): 161/92 Reference Range: 80 - 120 mg / dl Electronic Signature(s) Signed: 03/26/2019 2:46:54 PM By: Army Melia Entered By: Army Melia on 03/26/2019 14:40:48

## 2019-03-26 NOTE — Progress Notes (Addendum)
[name] ([id number]) Visit Report for 03/26/2019 Chief Complaint Document Details Patient Name: [name], Larew. Date of Service: 03/26/2019 2:45 PM Medical Record Number: [id number] Patient Account Number: [id number] Date of Birth/Sex: [birth year] (63 y.o. M) Treating RN: Montey Hora Primary Care Provider: Raelyn Ensign Other Clinician: Referring Provider: Raelyn Ensign Treating Provider/Extender: Melburn Hake, HOYT Weeks in Treatment: 2 Information Obtained from: Patient Chief Complaint Left Gluteal Pressure Ulcer Electronic Signature(s) Signed: 03/26/2019 2:35:41 PM By: Worthy Keeler PA-C Entered By: Worthy Keeler on 03/26/2019 14:35:40 Stephens, [name] ([id number]) -------------------------------------------------------------------------------- HPI Details Patient Name: [name] Date of Service: 03/26/2019 2:45 PM Medical Record Number: [id number] Patient Account Number: [id number] Date of Birth/Sex: [birth year] (63 y.o. M) Treating RN: Montey Hora Primary Care Provider: Raelyn Ensign Other Clinician: Referring Provider: Raelyn Ensign Treating Provider/Extender: Melburn Hake, HOYT Weeks in Treatment: 2 History of Present Illness HPI Description: 03/08/2019 on evaluation today patient actually appears to be doing overall somewhat poorly with regard to his wound in the left gluteal region. Unfortunately he has been in 2 different skilled nursing facilities where he unfortunately did not have the amount of care that sounds like he needed with regard to this pressure ulcer. He has been tolerating the dressing changes without complication. Fortunately he does not seem to have any signs of infection but unfortunately there really at a loss for exactly what to do. He was admitted to the hospital and subsequently the nursing facilities due to severe major depressive disorder he is actually undergone electroshock therapy. He does seem to be very flat as far as his affect  is concerned but nonetheless seems to be a very pleasant individual. He is seen with his wife during the office visit today. He does tell me that he spends most of his day sitting on the couch watching TV. I think the prolonged sitting may be causing some complications and problems at this point as well. Obviously that is definitely of concern. How to counter this will be discussed further in the plan. 03/26/2019 on evaluation today patient actually appears to be doing quite well with regard to his wound in the left gluteal region. He has been tolerating the dressing changes without complication. Fortunately there is no signs of active infection at this time. No fevers, chills, nausea, vomiting, or diarrhea. Electronic Signature(s) Signed: 03/26/2019 5:01:06 PM By: Worthy Keeler PA-C Entered By: Worthy Keeler on 03/26/2019 17:01:05 [name] ([id number]) -------------------------------------------------------------------------------- Physical Exam Details Patient Name: [name] Date of Service: 03/26/2019 2:45 PM Medical Record Number: [id number] Patient Account Number: [id number] Date of Birth/Sex: 1955/12/23 (63 y.o. M) Treating RN: Montey Hora Primary Care Provider: Raelyn Ensign Other Clinician: Referring Provider: Raelyn Ensign Treating Provider/Extender: STONE III, HOYT Weeks in Treatment: 2 Constitutional Well-nourished and well-hydrated in no acute distress. Respiratory normal breathing without difficulty. clear to auscultation bilaterally. Cardiovascular regular rate and rhythm with normal S1, S2. Psychiatric this patient is able to make decisions and demonstrates good insight into disease process. Alert and Oriented x 3. pleasant and cooperative. Notes Patient's wound bed actually showed signs of fairly good granulation there is no signs of active infection at this time. Fortunately he seems to be tolerating the dressing changes without any pain or  complication unfortunately he did not ever hear from home health we did not hear anything from the neither so had assumed they went out to see him. Nonetheless that is something he would still like to get set up if at  all possible. Electronic Signature(s) Signed: 03/26/2019 5:01:46 PM By: Worthy Keeler PA-C Entered By: Worthy Keeler on 03/26/2019 17:01:46 [name] ([id number]) -------------------------------------------------------------------------------- Physician Orders Details Patient Name: [name] Date of Service: 03/26/2019 2:45 PM Medical Record Number: [id number] Patient Account Number: [id number] Date of Birth/Sex: [birth year] (63 y.o. M) Treating RN: Montey Hora Primary Care Provider: Raelyn Ensign Other Clinician: Referring Provider: Raelyn Ensign Treating Provider/Extender: Melburn Hake, HOYT Weeks in Treatment: 2 Verbal / Phone Orders: No Diagnosis Coding ICD-10 Coding Code Description L89.323 Pressure ulcer of left buttock, stage 3 F33.2 Major depressive disorder, recurrent severe without psychotic features Wound Cleansing Wound #1 Left Gluteus o Dial antibacterial soap, wash wounds, rinse and pat dry prior to dressing wounds o May Shower, gently pat wound dry prior to applying new dressing. Primary Wound Dressing Wound #1 Left Gluteus o Silver Alginate Secondary Dressing Wound #1 Left Gluteus o Boardered Foam Dressing Dressing Change Frequency Wound #1 Left Gluteus o Change dressing every day. Follow-up Appointments Wound #1 Left Gluteus o Return Appointment in 2 weeks. Off-Loading Wound #1 Left Gluteus o Turn and reposition every 2 hours - Do not sit in the same position constantly o Other: - Walk for at least 5 minutes for every hour that you are sitting to help with pressure relief Home Health Wound #1 Left [place] for [place] Nurse may visit PRN to address patientos wound  care needs. o FACE TO FACE ENCOUNTER: MEDICARE and MEDICAID PATIENTS: I certify that this patient is under my care and that I had a face-to-face encounter that meets the physician face-to-face encounter requirements with this patient on this date. The encounter with the patient was in whole or in part for the following MEDICAL CONDITION: (primary reason for [place]) MEDICAL NECESSITY: I certify, that based on my findings, NURSING services are a medically necessary home health service. HOME BOUND STATUS: I certify that my clinical findings support that this patient is homebound (i.e., Due to illness or injury, pt requires aid of supportive devices such as crutches, cane, wheelchairs, walkers, the use of special transportation or the [place], [name]. ([id number]) assistance of another person to leave their place of residence. There is a normal inability to leave the home and doing so requires considerable and taxing effort. Other absences are for medical reasons / religious services and are infrequent or of short duration when for other reasons). o If current dressing causes regression in wound condition, may D/C ordered dressing product/s and apply Normal Saline Moist Dressing daily until next [place] / Other MD appointment. [place] of regression in wound condition at [phone]. o Please direct any NON-WOUND related issues/requests for orders to patient's Primary Care Physician Electronic Signature(s) Signed: 03/26/2019 3:57:57 PM By: Montey Hora Signed: 03/28/2019 2:05:35 AM By: Worthy Keeler PA-C Entered By: Montey Hora on 03/26/2019 15:29:10 Rodrigues, [name] ([id number]) -------------------------------------------------------------------------------- Problem List Details Patient Name: [name] Date of Service: 03/26/2019 2:45 PM Medical Record Number: [id number] Patient Account Number: [id number] Date of Birth/Sex: 1956-04-25  (63 y.o. M) Treating RN: Montey Hora Primary Care Provider: Raelyn Ensign Other Clinician: Referring Provider: Raelyn Ensign Treating Provider/Extender: Melburn Hake, HOYT Weeks in Treatment: 2 Active Problems ICD-10 Evaluated Encounter Code Description Active Date Today Diagnosis L89.323 Pressure ulcer of left buttock, stage 3 03/08/2019 No Yes F33.2 Major depressive disorder, recurrent severe without psychotic 03/08/2019 No Yes features Inactive Problems Resolved Problems Electronic Signature(s) Signed:  03/26/2019 2:35:33 PM By: Worthy Keeler PA-C Entered By: Worthy Keeler on 03/26/2019 14:35:33 Rocco, [name] ([id number]) -------------------------------------------------------------------------------- Progress Note Details Patient Name: [name] Date of Service: 03/26/2019 2:45 PM Medical Record Number: [id number] Patient Account Number: [id number] Date of Birth/Sex: Aug 13, 1955 (63 y.o. M) Treating RN: Montey Hora Primary Care Provider: Raelyn Ensign Other Clinician: Referring Provider: Raelyn Ensign Treating Provider/Extender: Melburn Hake, HOYT Weeks in Treatment: 2 Subjective Chief Complaint Information obtained from Patient Left Gluteal Pressure Ulcer History of Present Illness (HPI) 03/08/2019 on evaluation today patient actually appears to be doing overall somewhat poorly with regard to his wound in the left gluteal region. Unfortunately he has been in 2 different skilled nursing facilities where he unfortunately did not have the amount of care that sounds like he needed with regard to this pressure ulcer. He has been tolerating the dressing changes without complication. Fortunately he does not seem to have any signs of infection but unfortunately there really at a loss for exactly what to do. He was admitted to the hospital and subsequently the nursing facilities due to severe major depressive disorder he is actually undergone electroshock therapy. He does seem  to be very flat as far as his affect is concerned but nonetheless seems to be a very pleasant individual. He is seen with his wife during the office visit today. He does tell me that he spends most of his day sitting on the couch watching TV. I think the prolonged sitting may be causing some complications and problems at this point as well. Obviously that is definitely of concern. How to counter this will be discussed further in the plan. 03/26/2019 on evaluation today patient actually appears to be doing quite well with regard to his wound in the left gluteal region. He has been tolerating the dressing changes without complication. Fortunately there is no signs of active infection at this time. No fevers, chills, nausea, vomiting, or diarrhea. Patient History Information obtained from Patient. Family History No family history of Cancer, Diabetes, Heart Disease, Hereditary Spherocytosis, Hypertension, Kidney Disease, Lung Disease, Seizures, Stroke, Thyroid Problems, Tuberculosis. Social History Never smoker, Marital Status - Married, Alcohol Use - Never, Drug Use - No History, Caffeine Use - Never. Medical History Eyes Denies history of Cataracts, Glaucoma, Optic Neuritis Ear/Nose/Mouth/Throat Denies history of Chronic sinus problems/congestion, Middle ear problems Hematologic/Lymphatic Denies history of Anemia, Hemophilia, Human Immunodeficiency Virus, Lymphedema, Sickle Cell Disease Respiratory Denies history of Aspiration, Asthma, Chronic Obstructive Pulmonary Disease (COPD), Pneumothorax, Sleep Apnea, Tuberculosis Cardiovascular Denies history of Angina, Arrhythmia, Congestive Heart Failure, Coronary Artery Disease, Deep Vein Thrombosis, Hypertension, Hypotension, Myocardial Infarction, Peripheral Arterial Disease, Peripheral Venous Disease, Phlebitis, Vasculitis Gastrointestinal [name], Arthur W. ([id number]) Denies history of Cirrhosis , Colitis, Crohn s, Hepatitis A, Hepatitis B,  Hepatitis C Endocrine Denies history of Type I Diabetes, Type II Diabetes Genitourinary Denies history of End Stage Renal Disease Immunological Denies history of Lupus Erythematosus, Raynaud s, Scleroderma Integumentary (Skin) Denies history of History of Burn, History of pressure wounds Musculoskeletal Denies history of Gout, Rheumatoid Arthritis, Osteoarthritis, Osteomyelitis Neurologic Denies history of Dementia, Neuropathy, Quadriplegia, Paraplegia, Seizure Disorder Oncologic Denies history of Received Chemotherapy, Received Radiation Psychiatric Denies history of Anorexia/bulimia, Confinement Anxiety Hospitalization/Surgery History - [facility]. Review of Systems (ROS) Constitutional Symptoms (General Health) Denies complaints or symptoms of Fatigue, Fever, Chills, Marked Weight Change. Respiratory Denies complaints or symptoms of Chronic or frequent coughs, Shortness of Breath. Cardiovascular Denies complaints or symptoms of Chest pain, LE edema. Psychiatric Denies complaints  or symptoms of Anxiety, Claustrophobia. Objective Constitutional Well-nourished and well-hydrated in no acute distress. Vitals Time Taken: 2:40 PM, Height: 70 in, Weight: 152 lbs, BMI: 21.8, Temperature: 98.4 F, Pulse: 79 bpm, Respiratory Rate: 16 breaths/min, Blood Pressure: 161/92 mmHg. Respiratory normal breathing without difficulty. clear to auscultation bilaterally. Cardiovascular regular rate and rhythm with normal S1, S2. Psychiatric this patient is able to make decisions and demonstrates good insight into disease process. Alert and Oriented x 3. pleasant and cooperative. General Notes: Patient's wound bed actually showed signs of fairly good granulation there is no signs of active infection at [place], Arthur BEHRMANN. ([id number]) this time. Fortunately he seems to be tolerating the dressing changes without any pain or complication unfortunately he did not ever hear from home health we did not hear  anything from the neither so had assumed they went out to see him. Nonetheless that is something he would still like to get set up if at all possible. Integumentary (Hair, Skin) Wound #1 status is Open. Original cause of wound was Pressure Injury. The wound is located on the Left Gluteus. The wound measures 0.9cm length x 1.1cm width x 0.1cm depth; 0.778cm^2 area and 0.078cm^3 volume. There is Fat Layer (Subcutaneous Tissue) Exposed exposed. There is no tunneling or undermining noted. There is a medium amount of serosanguineous drainage noted. There is large (67-100%) red granulation within the wound bed. There is a small (1-33%) amount of necrotic tissue within the wound bed including Adherent Slough. Assessment Active Problems ICD-10 Pressure ulcer of left buttock, stage 3 Major depressive disorder, recurrent severe without psychotic features Plan Wound Cleansing: Wound #1 Left Gluteus: Dial antibacterial soap, wash wounds, rinse and pat dry prior to dressing wounds May Shower, gently pat wound dry prior to applying new dressing. Primary Wound Dressing: Wound #1 Left Gluteus: Silver Alginate Secondary Dressing: Wound #1 Left Gluteus: Boardered Foam Dressing Dressing Change Frequency: Wound #1 Left Gluteus: Change dressing every day. Follow-up Appointments: Wound #1 Left Gluteus: Return Appointment in 2 weeks. Off-Loading: Wound #1 Left Gluteus: Turn and reposition every 2 hours - Do not sit in the same position constantly Other: - Walk for at least 5 minutes for every hour that you are sitting to help with pressure relief Home Health: Wound #1 Left Gluteus: [place] for [place] Nurse may visit PRN to address patient s wound care needs. FACE TO FACE ENCOUNTER: MEDICARE and MEDICAID PATIENTS: I certify that this patient is under my care and that I had a face-to-face encounter that meets the physician face-to-face encounter requirements with this  patient on this date. The encounter with the patient was in whole or in part for the following MEDICAL CONDITION: (primary reason for [place]) MEDICAL NECESSITY: I certify, that based on my findings, NURSING services are a medically necessary home health service. HOME BOUND STATUS: I certify that my clinical findings support that this patient is homebound (i.e., Due to [name], Arthur DINTINO. ([id number]) illness or injury, pt requires aid of supportive devices such as crutches, cane, wheelchairs, walkers, the use of special transportation or the assistance of another person to leave their place of residence. There is a normal inability to leave the home and doing so requires considerable and taxing effort. Other absences are for medical reasons / religious services and are infrequent or of short duration when for other reasons). If current dressing causes regression in wound condition, may D/C ordered dressing product/s and apply Normal Saline Moist Dressing daily until next [facility] / Other MD appointment. [name] of regression in wound condition at [phone]. Please direct any NON-WOUND related issues/requests for orders to patient's Primary Care Physician 1. I would recommend that we continue with the silver alginate dressing at this point since the patient seems to be doing well he is in agreement with the plan. 2. I still recommend that he continue to offload as much as possible in order to prevent any pressure to the area which in turn will help this to heal much more quickly. 3. I recommend also that the patient continue to change the dressing roughly every other day although once home health is involved I think that that can definitely be Monday, Wednesday, and Friday or however works out best to get the dressing changes in on a semi-regular basis. We will see patient back for reevaluation in 1 week here in the clinic. If anything worsens or changes  patient will contact our office for additional recommendations. Electronic Signature(s) Signed: 03/26/2019 5:02:48 PM By: Worthy Keeler PA-C Entered By: Worthy Keeler on 03/26/2019 17:02:48 [place], [name] ([id number]) -------------------------------------------------------------------------------- ROS/PFSH Details Patient Name: [name] Date of Service: 03/26/2019 2:45 PM Medical Record Number: [id number] Patient Account Number: [id number] Date of Birth/Sex: 08-26-1955 (63 y.o. M) Treating RN: Montey Hora Primary Care Provider: Raelyn Ensign Other Clinician: Referring Provider: Raelyn Ensign Treating Provider/Extender: Melburn Hake, HOYT Weeks in Treatment: 2 Information Obtained From Patient Constitutional Symptoms (General Health) Complaints and Symptoms: Negative for: Fatigue; Fever; Chills; Marked Weight Change Respiratory Complaints and Symptoms: Negative for: Chronic or frequent coughs; Shortness of Breath Medical History: Negative for: Aspiration; Asthma; Chronic Obstructive Pulmonary Disease (COPD); Pneumothorax; Sleep Apnea; Tuberculosis Cardiovascular Complaints and Symptoms: Negative for: Chest pain; LE edema Medical History: Negative for: Angina; Arrhythmia; Congestive Heart Failure; Coronary Artery Disease; Deep Vein Thrombosis; Hypertension; Hypotension; Myocardial Infarction; Peripheral Arterial Disease; Peripheral Venous Disease; Phlebitis; Vasculitis Psychiatric Complaints and Symptoms: Negative for: Anxiety; Claustrophobia Medical History: Negative for: Anorexia/bulimia; Confinement Anxiety Eyes Medical History: Negative for: Cataracts; Glaucoma; Optic Neuritis Ear/Nose/Mouth/Throat Medical History: Negative for: Chronic sinus problems/congestion; Middle ear problems Hematologic/Lymphatic Medical History: Negative for: Anemia; Hemophilia; Human Immunodeficiency Virus; Lymphedema; Sickle Cell Disease [place], Arthur REINHARDT.  ([id number]) Gastrointestinal Medical History: Negative for: Cirrhosis ; Colitis; Crohnos; Hepatitis A; Hepatitis B; Hepatitis C Endocrine Medical History: Negative for: Type I Diabetes; Type II Diabetes Genitourinary Medical History: Negative for: End Stage Renal Disease Immunological Medical History: Negative for: Lupus Erythematosus; Raynaudos; Scleroderma Integumentary (Skin) Medical History: Negative for: History of Burn; History of pressure wounds Musculoskeletal Medical History: Negative for: Gout; Rheumatoid Arthritis; Osteoarthritis; Osteomyelitis Neurologic Medical History: Negative for: Dementia; Neuropathy; Quadriplegia; Paraplegia; Seizure Disorder Oncologic Medical History: Negative for: Received Chemotherapy; Received Radiation Immunizations Pneumococcal Vaccine: Received Pneumococcal Vaccination: No Implantable Devices None Hospitalization / Surgery History Type of Hospitalization/Surgery [facility] Family and Social History Cancer: No; Diabetes: No; Heart Disease: No; Hereditary Spherocytosis: No; Hypertension: No; Kidney Disease: No; Lung Disease: No; Seizures: No; Stroke: No; Thyroid Problems: No; Tuberculosis: No; Never smoker; Marital Status - Married; Alcohol Use: Never; Drug Use: No History; Caffeine Use: Never; Financial Concerns: No; Food, Clothing or Shelter Needs: No; Support System Lacking: No; Transportation Concerns: No Physician Affirmation I have reviewed and agree with the above information. [place], [name] ([id number]) Electronic Signature(s) Signed: 03/27/2019 4:36:54 PM By: Montey Hora Signed: 03/28/2019 2:05:35 AM By: Worthy Keeler PA-C Entered By: Worthy Keeler on 03/26/2019 17:01:33 [place], [name] ([id number]) -------------------------------------------------------------------------------- SuperBill Details Patient  Name: Stephens, Arthur W. Date of Service: 03/26/2019 Medical Record Number: [id number] Patient Account Number:  [id number] Date of Birth/Sex: Dec 14, 1955 (63 y.o. M) Treating RN: Montey Hora Primary Care Provider: Raelyn Ensign Other Clinician: Referring Provider: Raelyn Ensign Treating Provider/Extender: Melburn Hake, HOYT Weeks in Treatment: 2 Diagnosis Coding ICD-10 Codes Code Description [id number] Pressure ulcer of left buttock, stage 3 F33.2 Major depressive disorder, recurrent severe without psychotic features Facility Procedures CPT4 Code: [id number] Description: 99213 - WOUND CARE VISIT-LEV 3 EST PT Modifier: Quantity: 1 Physician Procedures CPT4 Code: [id number] Description: [id number] - WC PHYS LEVEL 4 - EST PT ICD-10 Diagnosis Description L89.323 Pressure ulcer of left buttock, stage 3 F33.2 Major depressive disorder, recurrent severe without psycho Modifier: tic features Quantity: 1 Electronic Signature(s) Signed: 03/27/2019 3:07:10 PM By: Montey Hora Signed: 03/28/2019 2:05:35 AM By: Worthy Keeler PA-C Previous Signature: 03/26/2019 5:03:15 PM Version By: Worthy Keeler PA-C Entered By: Montey Hora on 03/27/2019 15:07:09

## 2019-04-09 ENCOUNTER — Ambulatory Visit: Payer: BLUE CROSS/BLUE SHIELD | Admitting: Physician Assistant

## 2019-04-16 ENCOUNTER — Ambulatory Visit: Payer: BLUE CROSS/BLUE SHIELD | Admitting: Physician Assistant

## 2019-04-30 ENCOUNTER — Ambulatory Visit: Payer: Self-pay | Admitting: Family Medicine

## 2019-05-28 ENCOUNTER — Ambulatory Visit: Payer: Self-pay | Admitting: Family Medicine

## 2019-06-09 IMAGING — CT CT HEAD W/O CM
3 series · 16 of 47 positions shown, 19 images · non-contrast
Comparison: 05/01/2006.

CLINICAL DATA: Weakness, back pain for 4 days, no injury.

EXAM:
CT HEAD WITHOUT CONTRAST
TECHNIQUE: Contiguous axial images were obtained from the base of the skull
through the vertex without intravenous contrast.

[Series 3: head wo · axial · 0.44mm/px · z∈[+603,+733]mm · 10 of 32 slices shown, 13 images]
[im 3/32  brain]
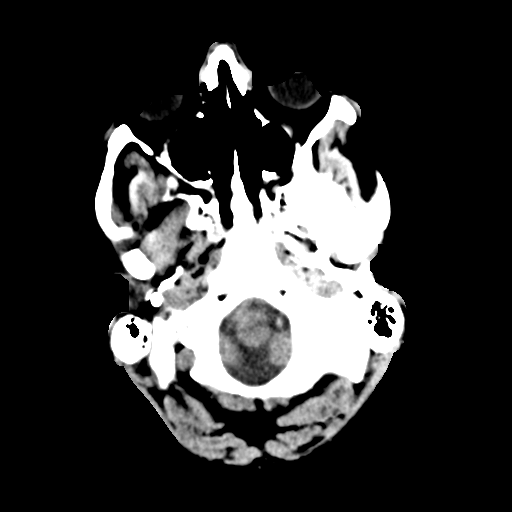
[im 3/32  bone]
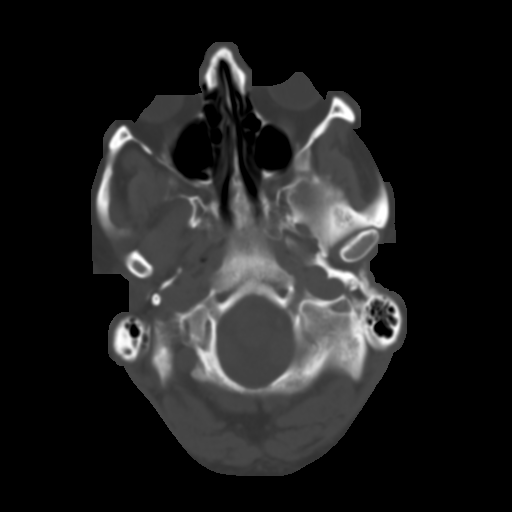
[im 6/32  brain]
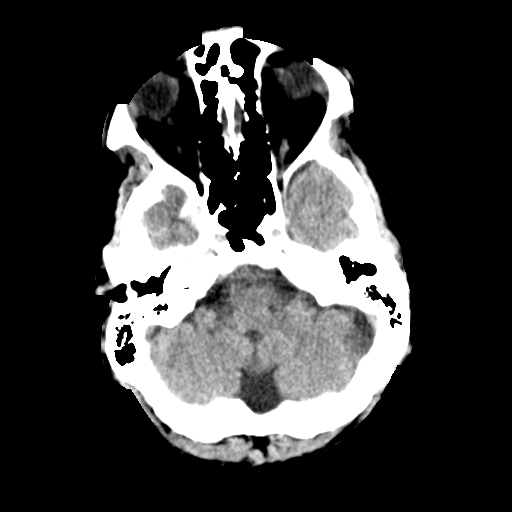
[im 9/32  brain]
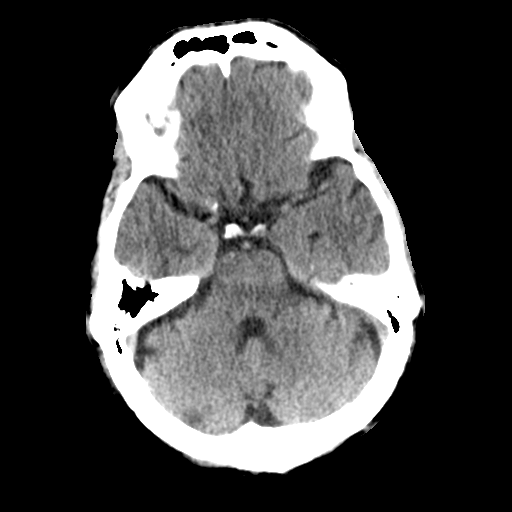
[im 11/32  brain]
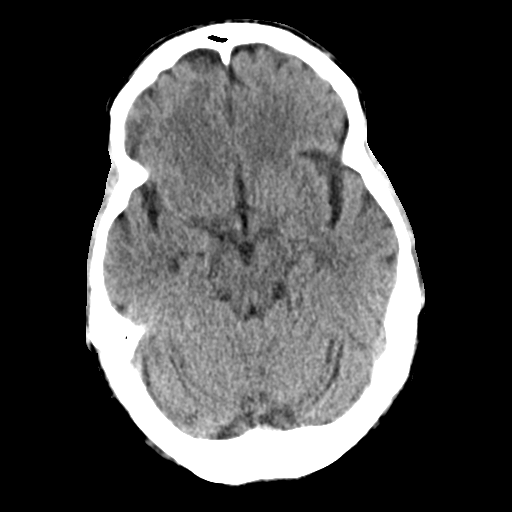
[im 14/32  brain]
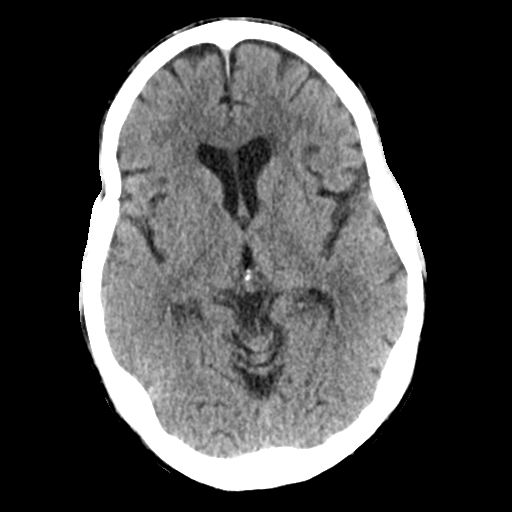
[im 14/32  bone]
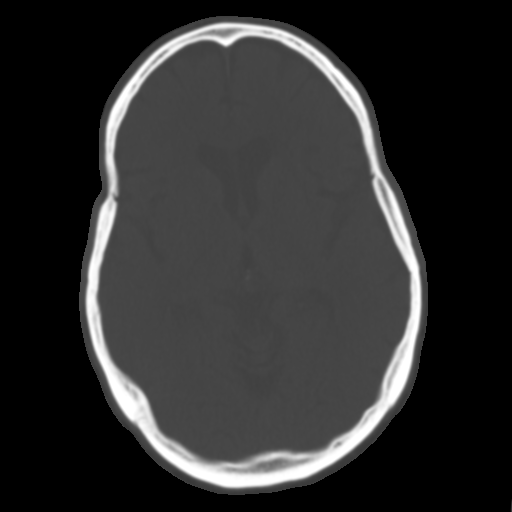
[im 18/32  brain]
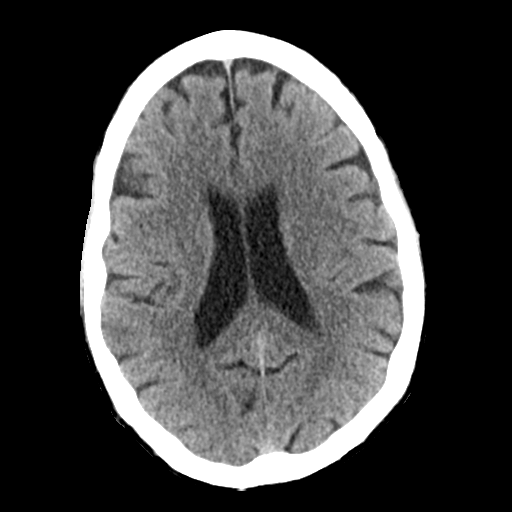
[im 21/32  brain]
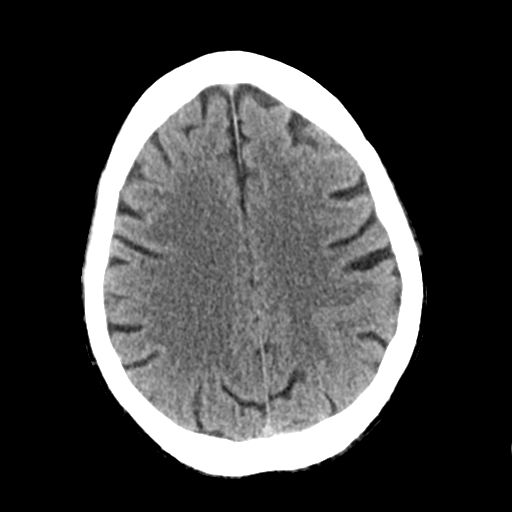
[im 24/32  brain]
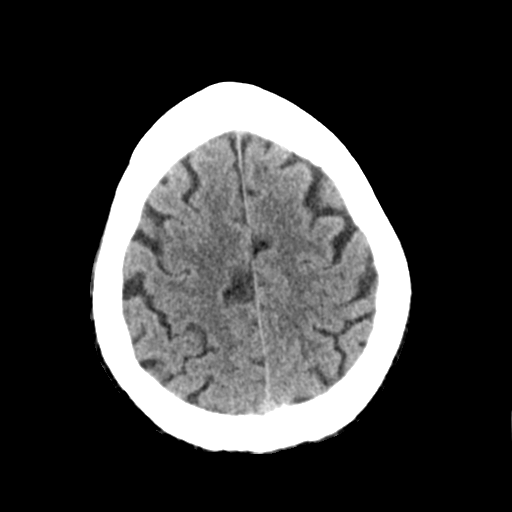
[im 26/32  brain]
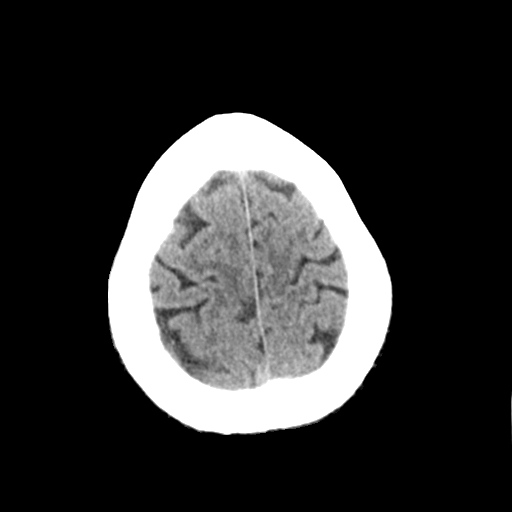
[im 26/32  bone]
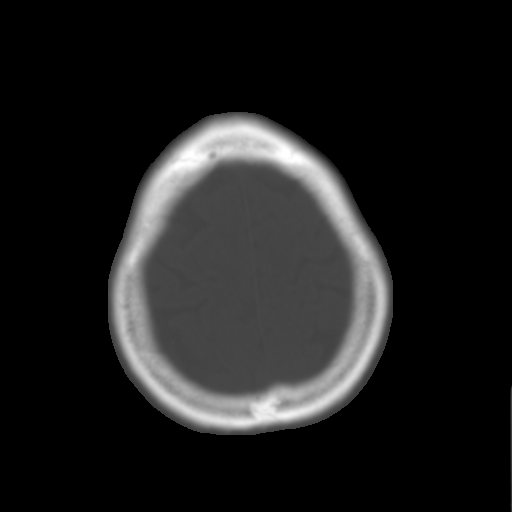
[im 29/32  brain]
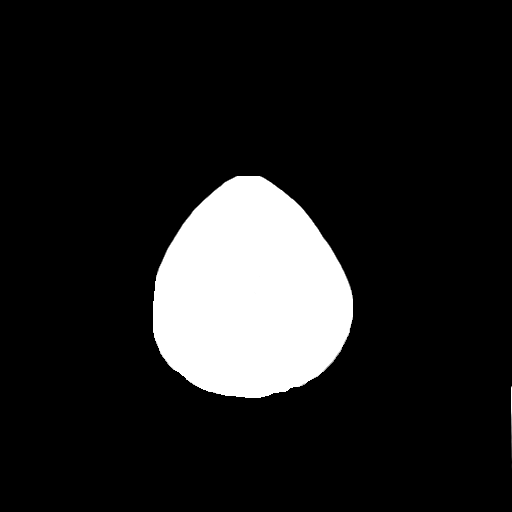

[Series 4: coronal soft tissue · coronal · 0.34mm/px · 3 of 76 slices shown]
[im 26/76  brain]
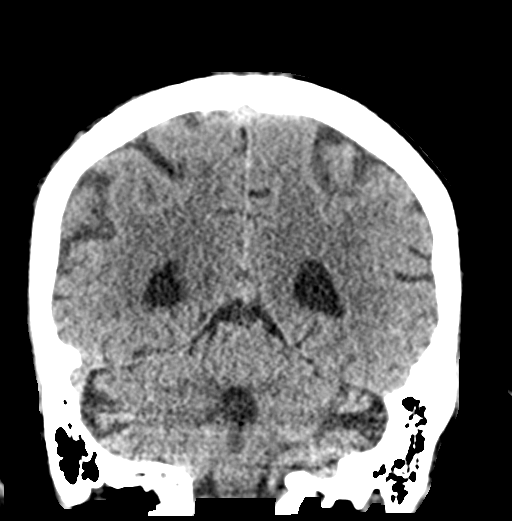
[im 34/76  brain]
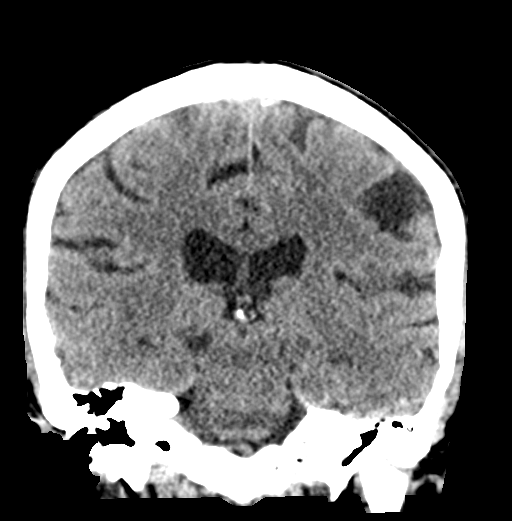
[im 42/76  brain]
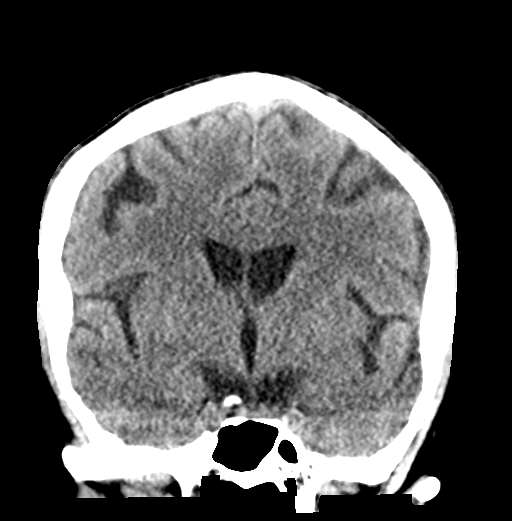

[Series 5: sagittal soft tissue · sagittal · 0.36mm/px · 3 of 59 slices shown]
[im 20/59  brain]
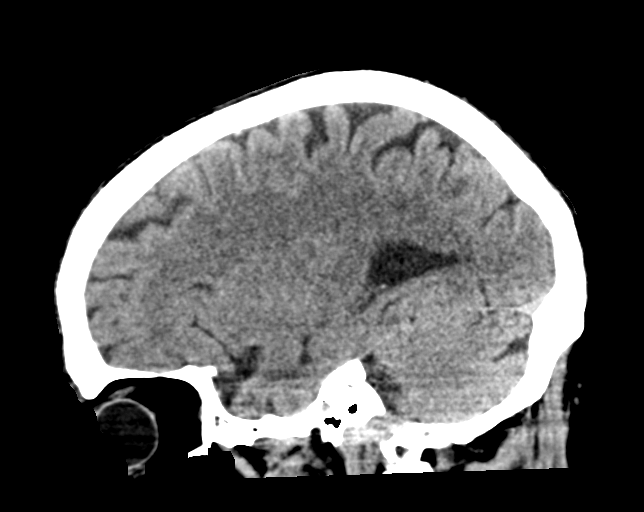
[im 30/59  brain]
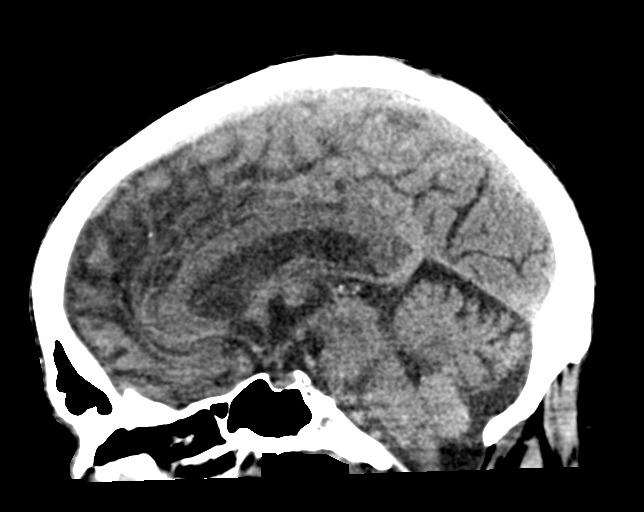
[im 39/59  brain]
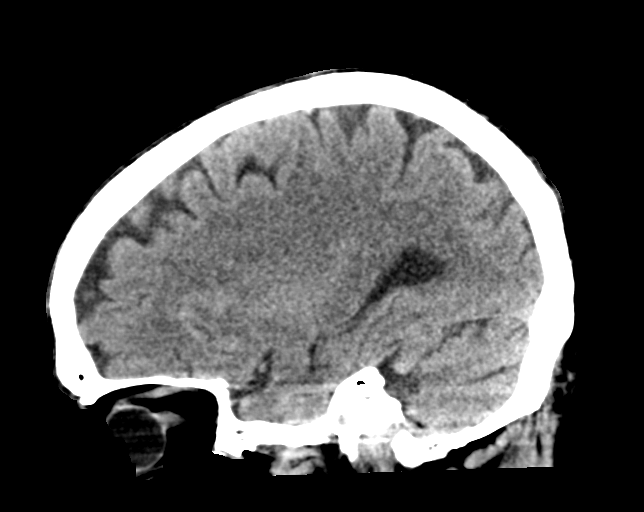

[16 of 47 positions shown; findings below may reference images not displayed]

FINDINGS: Brain: No evidence for acute infarction, hemorrhage, mass lesion,
hydrocephalus, or extra-axial fluid. Mild atrophy. Hypoattenuation
of white matter, likely small vessel disease.

Vascular: No hyperdense vessel or unexpected calcification.

Skull: Normal. Negative for fracture or focal lesion.

Sinuses/Orbits: No acute finding.

Other: None.
IMPRESSION: Mild atrophy. No acute intracranial findings. Progression of
cerebral volume loss since [DATE].

## 2019-06-09 IMAGING — MR MR LUMBAR SPINE W/O CM
5 series · 32 of 48 positions shown · non-contrast
Comparison: None.

CLINICAL DATA: Initial evaluation for generalized weakness with low
back pain for 4 days.

EXAM:
MRI LUMBAR SPINE WITHOUT CONTRAST
TECHNIQUE: Multiplanar, multisequence MR imaging of the lumbar spine was
performed. No intravenous contrast was administered.

[Series 2: T2 · sagittal · 4.0mm · 0.81mm/px · 5 of 15 slices shown (1 of 2)]
[im 1/15]
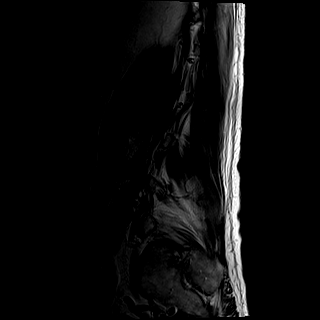
[im 4/15]
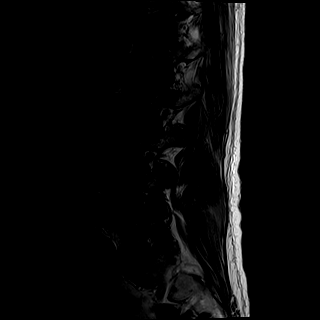
[im 8/15]
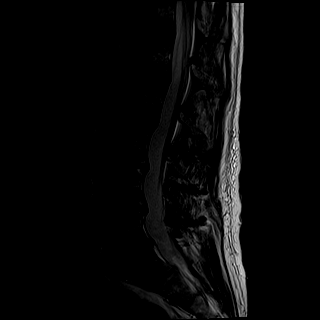
[im 11/15]
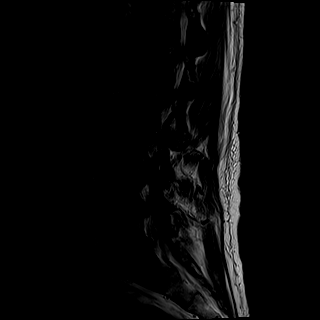
[im 15/15]
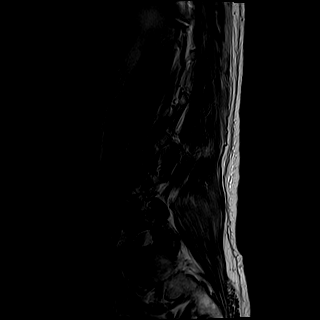

[Series 3: T1 · sagittal · 4.0mm · 0.81mm/px · 5 of 15 slices shown (1 of 2)]
[im 1/15]
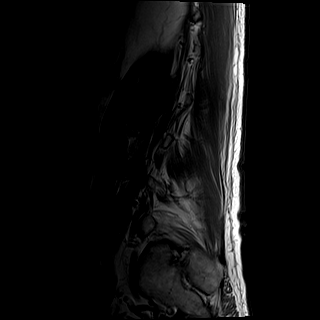
[im 4/15]
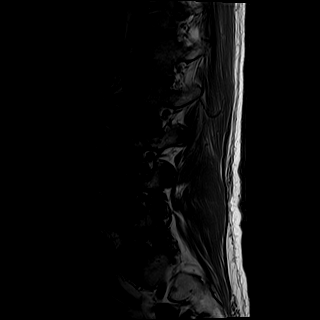
[im 8/15]
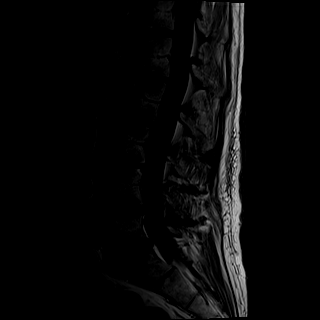
[im 11/15]
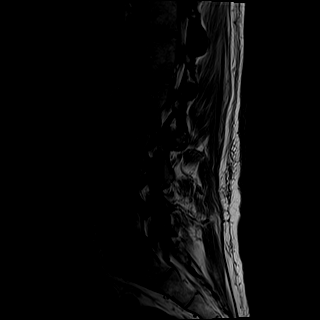
[im 15/15]
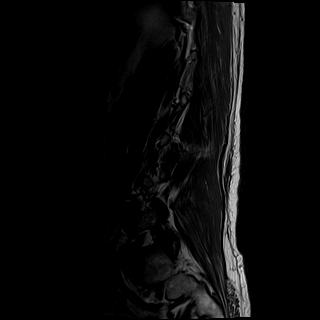

[Series 4: STIR · sagittal · 4.0mm · 0.81mm/px · 2 of 15 slices shown]
[im 1/15]
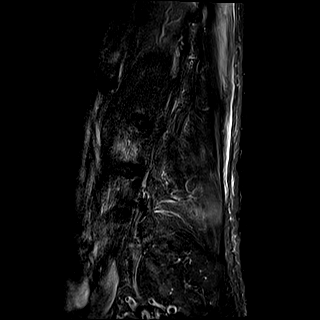
[im 3/15]
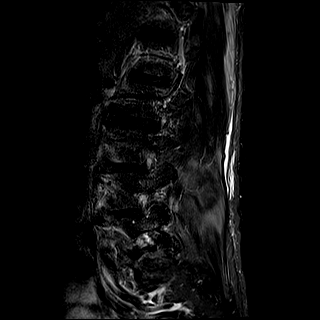

[Series 5: T2 · axial · 4.0mm · 0.78mm/px · z∈[-139,+91]mm · 10 of 42 slices shown (2 of 2)]
[im 3/42]
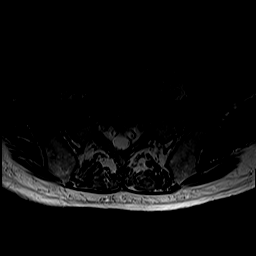
[im 6/42]
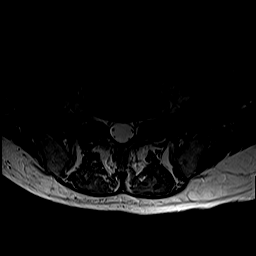
[im 9/42]
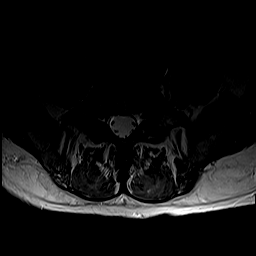
[im 14/42]
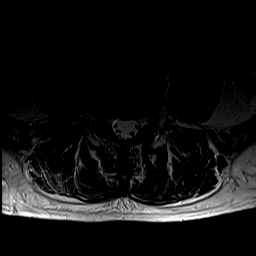
[im 20/42]
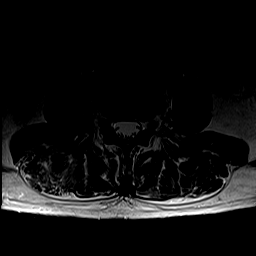
[im 22/42]
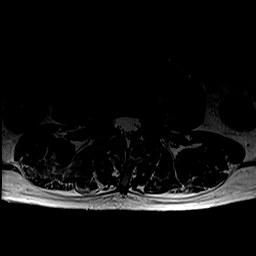
[im 25/42]
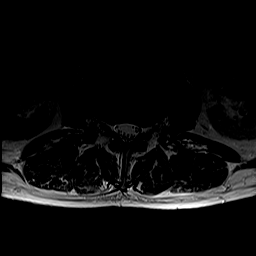
[im 31/42]
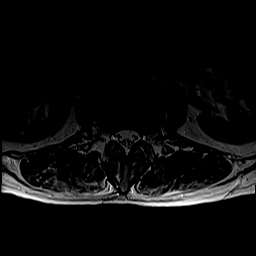
[im 36/42]
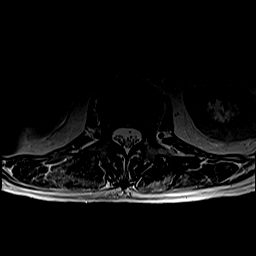
[im 42/42]
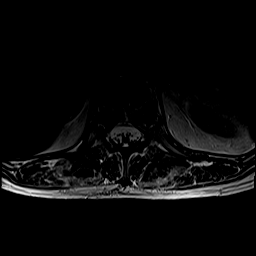

[Series 6: T1 · axial · 4.0mm · 0.39mm/px · z∈[-139,+91]mm · 10 of 42 slices shown (2 of 2)]
[im 3/42]
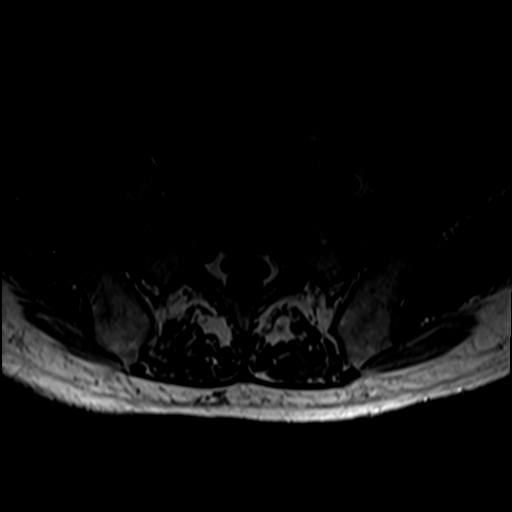
[im 6/42]
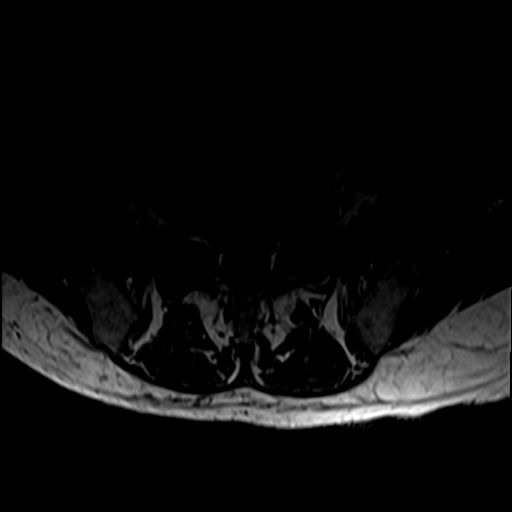
[im 9/42]
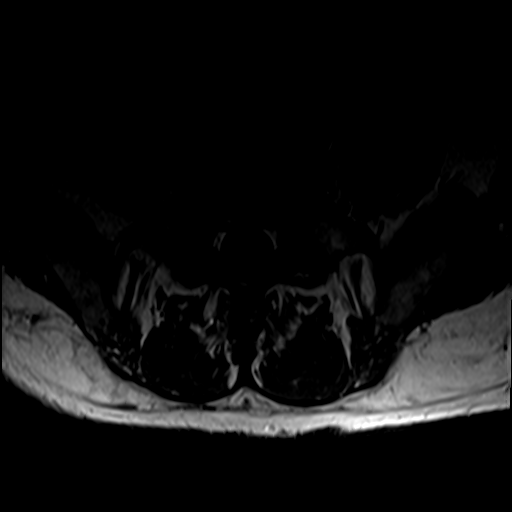
[im 14/42]
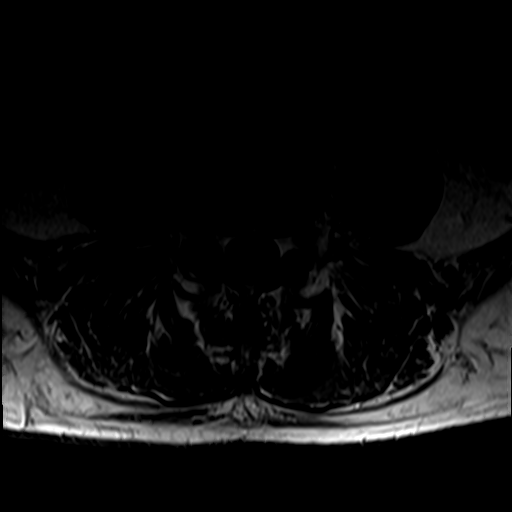
[im 20/42]
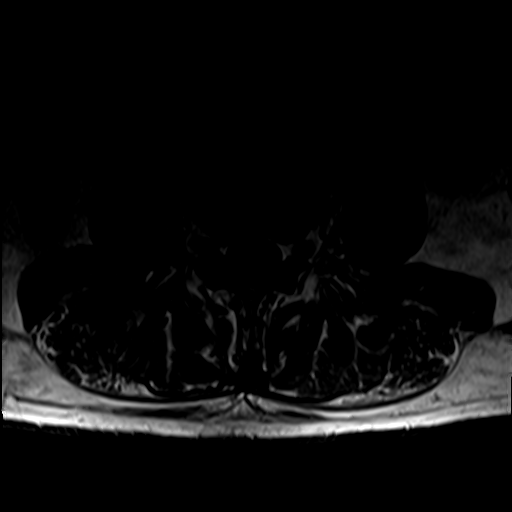
[im 22/42]
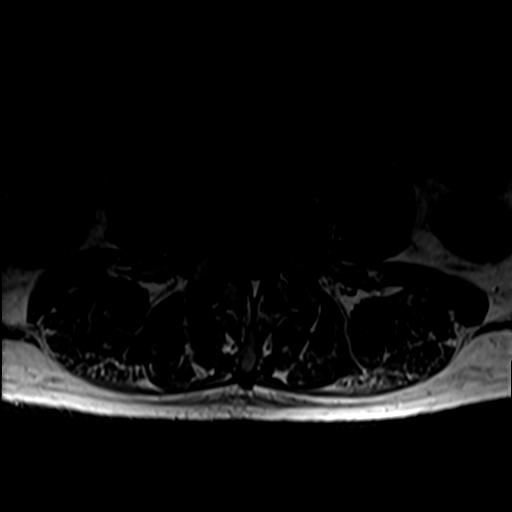
[im 25/42]
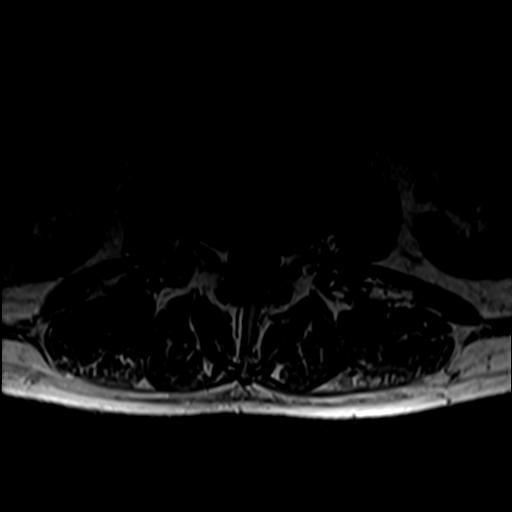
[im 31/42]
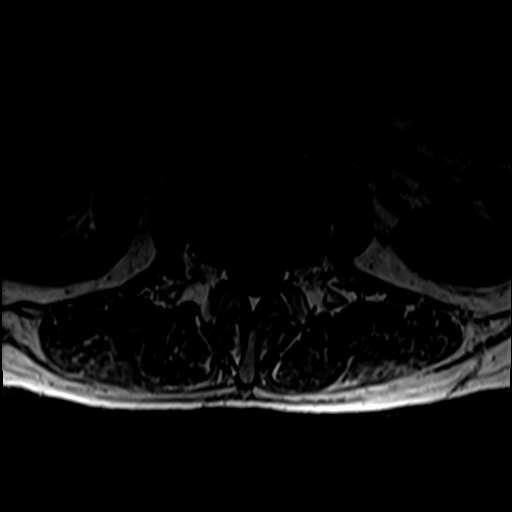
[im 36/42]
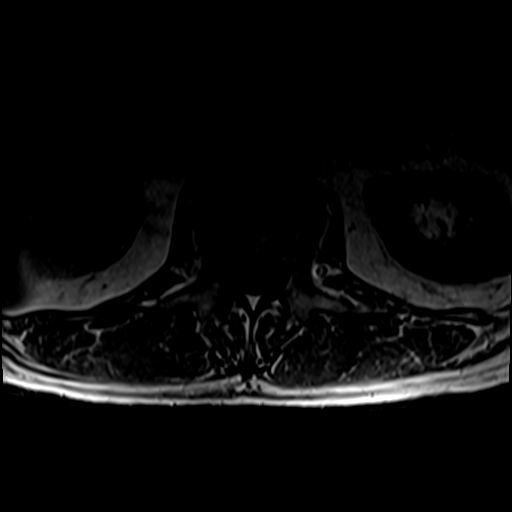
[im 42/42]
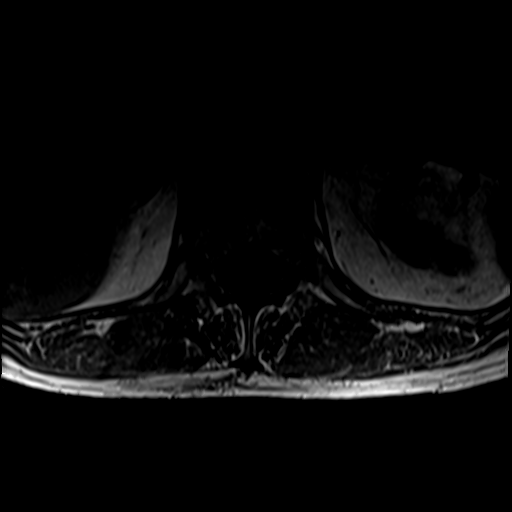

[32 of 48 positions shown; findings below may reference images not displayed]

FINDINGS: Segmentation: Normal segmentation. Lowest well-formed disc labeled
the L5-S1 level.

Alignment: Trace retrolisthesis of L2 on L3. Vertebral bodies
otherwise normally aligned with preservation of the normal lumbar
lordosis.

Vertebrae: Vertebral body heights maintained without evidence for
acute or chronic fracture. Bone marrow signal intensity within
normal limits. No discrete or worrisome osseous lesions. Chronic
reactive endplate changes present about the L5-S1 interspace.

Conus medullaris and cauda equina: Conus extends to the T12 level.
Conus and cauda equina appear normal.

Paraspinal and other soft tissues: Mild paraspinous edema within the
lower posterior paraspinous musculature, which could reflect
myositis and/or muscular injury/strain. Subcentimeter T2
hyperintense cyst noted within the right kidney. Visualized visceral
structures otherwise unremarkable.

Disc levels:

L1-2: Chronic intervertebral disc space narrowing without
significant disc bulge. No stenosis.

L2-3: Trace retrolisthesis. Mild chronic intervertebral disc space
narrowing with diffuse disc bulge and disc desiccation. Mild facet
hypertrophy. No stenosis.

L3-4: Mild diffuse disc bulge with disc desiccation. Mild facet
ligament flavum hypertrophy. Superimposed shallow right foraminal
disc protrusion contacts the exiting right L3 nerve root (series 3,
image 3). No significant spinal stenosis. Mild to moderate right L3
foraminal narrowing.

L4-5: Mild diffuse disc bulge. Mild facet hypertrophy. No
significant spinal stenosis. Mild bilateral L4 foraminal narrowing,
slightly greater on the right.

L5-S1: Chronic intervertebral disc space narrowing with diffuse disc
bulge and disc desiccation. Mild facet hypertrophy, greater on the
right. No significant spinal stenosis. Mild to moderate left L5
foraminal stenosis.
IMPRESSION: 1. No evidence for cord compression or cauda equina.
2. Soft tissue edema involving the posterior paraspinous musculature
bilaterally, right slightly greater than left. Finding is of
uncertain etiology or significance, but could reflect acute myositis
or possibly muscular injury/strain. Correlation with history and CPK
suggested.
3. Shallow right foraminal disc protrusion at L3-4, contacting and
potentially affecting the right L3 nerve root.
4. Disc bulge with facet hypertrophy at L4-5 and L5-S1 with
resultant mild bilateral L4 with mild to moderate left L5 foraminal
stenosis.

## 2019-06-11 ENCOUNTER — Ambulatory Visit: Payer: Self-pay | Admitting: Family Medicine

## 2019-06-21 ENCOUNTER — Encounter: Payer: Self-pay | Admitting: Family Medicine

## 2019-06-21 ENCOUNTER — Ambulatory Visit (INDEPENDENT_AMBULATORY_CARE_PROVIDER_SITE_OTHER): Payer: Self-pay | Admitting: Family Medicine

## 2019-06-21 ENCOUNTER — Other Ambulatory Visit: Payer: Self-pay

## 2019-06-21 DIAGNOSIS — Z5329 Procedure and treatment not carried out because of patient's decision for other reasons: Secondary | ICD-10-CM

## 2019-06-21 NOTE — Progress Notes (Signed)
  Called number provided on visit notes at 2:12pm - no answer Called number provided on visit notes at 2:18pm - no answer Called home number at 2:29pm - no answer

## 2019-06-24 ENCOUNTER — Encounter: Payer: Self-pay | Admitting: Family Medicine

## 2019-06-26 ENCOUNTER — Telehealth: Payer: Self-pay | Admitting: Family Medicine

## 2019-07-02 ENCOUNTER — Telehealth: Payer: Self-pay | Admitting: Emergency Medicine

## 2019-07-02 NOTE — Telephone Encounter (Signed)
Unfortunately, patient has had >5 no shows this year with our clinic, including his most recent appt.  He has been discharged from the practice, and I am only able to provide acute care for 30 days from that time.  He will need to establish with new PCP.  If he had recent spinal surgery, this specific referral will need to come from his surgeon.

## 2019-07-02 NOTE — Telephone Encounter (Signed)
Copied from [place]. Topic: Referral - Request for Referral >> Jul 02, 2019  2:16 PM Reyne Dumas L wrote: Has patient seen PCP for this complaint? yes *If NO, is insurance requiring patient see PCP for this issue before PCP can refer them? Referral for which specialty: home health Preferred provider/office: [facility] Reason for referral: PT as well as general home health (showering, etc).  Pt's wife calling to see if home health can be ordered again.  States she just had spinal surgery and can't help him any longer. [name] can be reached at [phone].

## 2019-07-03 NOTE — Telephone Encounter (Signed)
Called patient number and left him mesage

## 2019-07-15 NOTE — Telephone Encounter (Signed)
Pt's wife is calling wanting to know where she can take him to get care for him.  He had a mini stroke about 4 or 5 months ago.  He is having problems caring for himself.  She is not able to help him because of her health.  She realizes he has been dismissed from the practice but she needs a referral if CS cant see him.  Please call her back at [phone]

## 2019-07-16 NOTE — Telephone Encounter (Signed)
Left message for [name] today and informed her she will have to call different doctor offices to see who is taking new patients to get him an appointment. If he has a Education officer, museum to reach out to them also for assistance

## 2019-09-30 ENCOUNTER — Emergency Department (HOSPITAL_COMMUNITY): Payer: Medicare Other

## 2019-09-30 ENCOUNTER — Emergency Department (HOSPITAL_COMMUNITY)
Admission: EM | Admit: 2019-09-30 | Discharge: 2019-09-30 | Disposition: A | Discharge status: Home / Self Care | Payer: Medicare Other | Attending: Emergency Medicine | Admitting: Emergency Medicine

## 2019-09-30 ENCOUNTER — Encounter (HOSPITAL_COMMUNITY): Payer: Self-pay | Admitting: *Deleted

## 2019-09-30 DIAGNOSIS — Z87891 Personal history of nicotine dependence: Secondary | ICD-10-CM | POA: Insufficient documentation

## 2019-09-30 DIAGNOSIS — R531 Weakness: Secondary | ICD-10-CM | POA: Diagnosis not present

## 2019-09-30 LAB — HEPATIC FUNCTION PANEL
ALT: 13 U/L (ref 0–44)
AST: 19 U/L (ref 15–41)
Albumin: 3.8 g/dL (ref 3.5–5.0)
Alkaline Phosphatase: 58 U/L (ref 38–126)
Bilirubin, Direct: 0.2 mg/dL (ref 0.0–0.2)
Indirect Bilirubin: 1 mg/dL — ABNORMAL HIGH (ref 0.3–0.9)
Total Bilirubin: 1.2 mg/dL (ref 0.3–1.2)
Total Protein: 7.1 g/dL (ref 6.5–8.1)

## 2019-09-30 LAB — BASIC METABOLIC PANEL
Anion gap: 10 (ref 5–15)
BUN: 22 mg/dL (ref 8–23)
CO2: 26 mmol/L (ref 22–32)
Calcium: 8.8 mg/dL — ABNORMAL LOW (ref 8.9–10.3)
Chloride: 101 mmol/L (ref 98–111)
Creatinine, Ser: 0.97 mg/dL (ref 0.61–1.24)
GFR calc Af Amer: >60 mL/min (ref >60)
GFR calc non Af Amer: >60 mL/min (ref >60)
Glucose, Bld: 92 mg/dL (ref 70–99)
Potassium: 3.6 mmol/L (ref 3.5–5.1)
Sodium: 137 mmol/L (ref 135–145)

## 2019-09-30 LAB — CBC
HCT: 41.9 % (ref 39.0–52.0)
Hemoglobin: 13.6 g/dL (ref 13.0–17.0)
MCH: 30.8 pg (ref 26.0–34.0)
MCHC: 32.5 g/dL (ref 30.0–36.0)
MCV: 94.8 fL (ref 80.0–100.0)
Platelets: 203 10*3/uL (ref 150–400)
RBC: 4.42 MIL/uL (ref 4.22–5.81)
RDW: 12.3 % (ref 11.5–15.5)
WBC: 5.9 10*3/uL (ref 4.0–10.5)
nRBC: 0 % (ref 0.0–0.2)

## 2019-09-30 LAB — URINALYSIS, ROUTINE W REFLEX MICROSCOPIC
Bilirubin Urine: NEGATIVE
Glucose, UA: NEGATIVE mg/dL
Hgb urine dipstick: NEGATIVE
Ketones, ur: NEGATIVE mg/dL
Leukocytes,Ua: NEGATIVE
Nitrite: NEGATIVE
Protein, ur: NEGATIVE mg/dL
Specific Gravity, Urine: 1.012 (ref 1.005–1.030)
pH: 6 (ref 5.0–8.0)

## 2019-09-30 LAB — CK: Total CK: 331 U/L (ref 49–397)

## 2019-09-30 MED ORDER — SODIUM CHLORIDE 0.9% FLUSH: 3.0000 mL | Freq: Once | INTRAVENOUS | Status: DC

## 2019-09-30 MED ORDER — SODIUM CHLORIDE 0.9 % IV BOLUS
1000.0000 mL | Freq: Once | INTRAVENOUS | Status: AC
  Administered 2019-09-30: 1000 mL via INTRAVENOUS

## 2019-09-30 MED ORDER — LORAZEPAM 1 MG PO TABS: 1.0000 mg | ORAL_TABLET | Freq: Once | ORAL | Status: DC

## 2019-09-30 MED ORDER — LORAZEPAM 2 MG/ML IJ SOLN
1.0000 mg | Freq: Once | INTRAMUSCULAR | Status: AC
  Administered 2019-09-30: 1 mg via INTRAVENOUS
  Filled 2019-09-30: qty 1

## 2019-09-30 NOTE — Care Management (Signed)
ED CM met with patient and wife at bedside to discuss transitional care planning.  Patient's spouse reports patient has been set up with [facility] and is scheduled to start Thursday. But she is concerned patient has increased weakness stating patient needs more services, RN for medication management. CM explained that I can discuss with EDP to possibly add more disciplines to the already existing services. Patient and spouse are agreeable with plan, CM forwarded referral to [facility] for [facility].

## 2019-09-30 NOTE — ED Provider Notes (Signed)
[facility] EMERGENCY DEPARTMENT Provider Note   CSN: [id number] Arrival date & time: 09/30/19  1408     History Chief Complaint  Patient presents with  . Weakness    [name] is a 64 y.o. male.  The history is provided by the patient.  Weakness Severity:  Mild Onset quality:  Gradual Timing:  Constant Progression:  Unchanged Chronicity:  New Context: not change in medication and not recent infection   Relieved by:  Nothing Worsened by:  Nothing Associated symptoms: difficulty walking and foul-smelling urine   Associated symptoms: no abdominal pain, no arthralgias, no chest pain, no cough, no dizziness, no dysuria, no numbness in extremities, no fever, no headaches, no nausea, no seizures, no shortness of breath and no vomiting   Risk factors: no neurologic disease and no new medications        Past Medical History:  Diagnosis Date  . Depression   . Prostate disease     Patient Active Problem List   Diagnosis Date Noted  . Benign prostatic hyperplasia with urinary frequency 04/04/2018  . Erectile dysfunction 04/04/2018  . Vitamin D deficiency 03/14/2018  . Weakness of back 03/14/2018  . Elevated CK 03/14/2018  . B12 deficiency 03/14/2018  . High risk medications (not anticoagulants) long-term use 03/14/2018  . Onychomycosis 03/14/2018  . Catatonia 01/12/2018  . Malnutrition of moderate degree 01/09/2018  . Pressure injury of skin 01/09/2018  . MDD (major depressive disorder), recurrent episode, severe ([place]) 01/09/2018  . Back pain 01/08/2018  . Shuffling gait 01/08/2018    Past Surgical History:  Procedure Laterality Date  . COLONOSCOPY WITH PROPOFOL N/A 03/22/2018   Procedure: COLONOSCOPY WITH PROPOFOL;  Surgeon: Jonathon Bellows, MD;  Location: [facility] ENDOSCOPY;  Service: Gastroenterology;  Laterality: N/A;  . HERNIA REPAIR         No family history on file.  Social History   Tobacco Use  . Smoking status: Former Research scientist (life sciences)  .  Smokeless tobacco: Former Systems developer    Quit date: 01/10/2017  Substance Use Topics  . Alcohol use: Not Currently  . Drug use: Not Currently    Home Medications Prior to Admission medications   Medication Sig Start Date End Date Taking? Authorizing Provider  Cholecalciferol (VITAMIN D3) 1.25 MG (50000 UT) CAPS Take 50,000 Units by mouth every Monday.  09/13/19  Yes [provider]  PRESCRIPTION MEDICATION Place 1 patch onto the skin daily as needed (wound care). Silvercel patches   Yes [provider]  mirtazapine (REMERON) 45 MG tablet TAKE 1 TABLET BY MOUTH AT BEDTIME Patient not taking: Reported on 09/30/2019 05/28/18   Clapacs, Madie Reno, MD  tadalafil (CIALIS) 10 MG tablet Take 0.5-1 tablets (5-10 mg total) by mouth daily as needed for erectile dysfunction. Patient not taking: Reported on 12/19/2018 04/04/18   Hubbard Hartshorn, FNP  traZODone (DESYREL) 100 MG tablet Take 1 tablet (100 mg total) by mouth at bedtime as needed for sleep. Patient not taking: Reported on 12/19/2018 02/08/18   Clapacs, Madie Reno, MD    Allergies    Patient has no known allergies.  Review of Systems   Review of Systems  Constitutional: Negative for chills and fever.  HENT: Negative for ear pain and sore throat.   Eyes: Negative for pain and visual disturbance.  Respiratory: Negative for cough and shortness of breath.   Cardiovascular: Negative for chest pain and palpitations.  Gastrointestinal: Negative for abdominal pain, nausea and vomiting.  Genitourinary: Negative for dysuria and  hematuria.  Musculoskeletal: Negative for arthralgias and back pain.  Skin: Negative for color change and rash.  Neurological: Positive for weakness. Negative for dizziness, tremors, seizures, syncope, facial asymmetry, speech difficulty, light-headedness and headaches.  All other systems reviewed and are negative.   Physical Exam Updated Vital Signs  ED Triage Vitals  Enc Vitals Group     BP 09/30/19 1545 125/69      Pulse Rate 09/30/19 1545 76     Resp 09/30/19 1545 16     Temp 09/30/19 1545 98 F (36.7 C)     Temp Source 09/30/19 1545 Oral     SpO2 09/30/19 1545 100 %     Weight 09/30/19 1552 150 lb (68 kg)     Height 09/30/19 1552 5\' 7"  (1.702 m)     Head Circumference --      Peak Flow --      Pain Score 09/30/19 1552 0     Pain Loc --      Pain Edu? --      Excl. in [place]? --     Physical Exam Vitals and nursing note reviewed.  Constitutional:      General: He is not in acute distress.    Appearance: He is well-developed. He is not ill-appearing.  HENT:     Head: Normocephalic and atraumatic.     Nose: Nose normal.     Mouth/Throat:     Mouth: Mucous membranes are moist.  Eyes:     Extraocular Movements: Extraocular movements intact.     Conjunctiva/sclera: Conjunctivae normal.     Pupils: Pupils are equal, round, and reactive to light.  Cardiovascular:     Rate and Rhythm: Normal rate and regular rhythm.     Pulses: Normal pulses.     Heart sounds: Normal heart sounds. No murmur.  Pulmonary:     Effort: Pulmonary effort is normal. No respiratory distress.     Breath sounds: Normal breath sounds.  Abdominal:     General: Abdomen is flat.     Palpations: Abdomen is soft.     Tenderness: There is no abdominal tenderness.  Musculoskeletal:     Cervical back: Normal range of motion and neck supple.  Skin:    General: Skin is warm and dry.     Capillary Refill: Capillary refill takes less than 2 seconds.  Neurological:     General: No focal deficit present.     Mental Status: He is alert and oriented to person, place, and time.     Cranial Nerves: No cranial nerve deficit.     Sensory: No sensory deficit.     Motor: No weakness.     Coordination: Coordination normal.     Comments: Overall poor effort on strength exam but appears to be grossly strength, normal sensation, normal finger-to-nose finger, no drift, normal speech, no visual field deficit  Psychiatric:        Mood and  Affect: Mood normal.     ED Results / Procedures / Treatments   Labs (all labs ordered are listed, but only abnormal results are displayed) Labs Reviewed  BASIC METABOLIC PANEL - Abnormal; Notable for the following components:      Result Value   Calcium 8.8 (*)    All other components within normal limits  HEPATIC FUNCTION PANEL - Abnormal; Notable for the following components:   Indirect Bilirubin 1.0 (*)    All other components within normal limits  CBC  URINALYSIS, ROUTINE W REFLEX MICROSCOPIC  CK  CBG MONITORING, ED    EKG EKG Interpretation  Date/Time:  Monday September 30 2019 15:49:53 EDT Ventricular Rate:  73 PR Interval:  160 QRS Duration: 80 QT Interval:  380 QTC Calculation: 418 R Axis:   -59 Text Interpretation: Normal sinus rhythm Left axis deviation Junctional ST depression, probably normal Abnormal ECG Confirmed by Lennice Sites [phone]) on 09/30/2019 6:03:21 PM   Radiology CT Head Wo Contrast  Result Date: 09/30/2019 CLINICAL DATA:  Increased weakness x2 weeks. EXAM: CT HEAD WITHOUT CONTRAST TECHNIQUE: Contiguous axial images were obtained from the base of the skull through the vertex without intravenous contrast. COMPARISON:  January 08, 2018 FINDINGS: Brain: There is mild cerebral atrophy with widening of the extra-axial spaces and ventricular dilatation. There are areas of decreased attenuation within the white matter tracts of the supratentorial brain, consistent with microvascular disease changes. A small area of cortical encephalomalacia, with adjacent chronic white matter low attenuation, is seen within the anterior aspect of the occipital lobe on the right (axial CT image 26, CT series number 3) Vascular: No hyperdense vessel or unexpected calcification. Skull: Normal. Negative for fracture or focal lesion. Sinuses/Orbits: No acute finding. Other: None. IMPRESSION: 1. No acute intracranial finding. 2. Small area of cortical encephalomalacia, with adjacent chronic  white matter low attenuation, seen within the anterior aspect of the right occipital lobe, consistent with old infarct. Electronically Signed   By: Virgina Norfolk M.D.   On: 09/30/2019 18:56   MR Brain Wo Contrast (neuro protocol)  Result Date: 09/30/2019 CLINICAL DATA:  64 year old male with increased weakness for 2 weeks. EXAM: MRI HEAD WITHOUT CONTRAST TECHNIQUE: Multiplanar, multiecho pulse sequences of the brain and surrounding structures were obtained without intravenous contrast. COMPARISON:  Head CT earlier today, head CT 01/08/2018. FINDINGS: Brain: No restricted diffusion to suggest acute infarction. No midline shift, mass effect, evidence of mass lesion, ventriculomegaly, extra-axial collection or acute intracranial hemorrhage. Cervicomedullary junction and pituitary are within normal limits. There is an area of chronic cortical encephalomalacia plus hemosiderin in the medial right occipital lobe, new since 2019 and corresponding to the plain CT finding earlier today. No other cortical encephalomalacia or chronic cerebral blood products identified; right cerebellar developmental venous anomaly (normal variant). Minimal nonspecific cerebral white matter T2 and FLAIR hyperintensity. Small perivascular spaces in the deep gray nuclei and midbrain. Vascular: Major intracranial vascular flow voids are preserved. Skull and upper cervical spine: Negative visible cervical spine. Normal bone marrow signal. Sinuses/Orbits: Negative orbits. Mild paranasal sinus mucosal thickening. Other: Mastoids are clear. Visible internal auditory structures appear normal. Normal stylomastoid foramina. Scalp and face soft tissues appear negative. IMPRESSION: 1. No acute intracranial abnormality. 2. Chronic right PCA territory infarct with hemosiderin in the medial right occipital lobe. Electronically Signed   By: Genevie Ann M.D.   On: 09/30/2019 21:18   DG Chest Portable 1 View  Result Date: 09/30/2019 CLINICAL DATA:   Increased weakness x2 weeks. EXAM: PORTABLE CHEST 1 VIEW COMPARISON:  December 19, 2018 FINDINGS: There is no evidence of acute infiltrate, pleural effusion or pneumothorax. An ill-defined nipple shadow is again seen overlying the lateral aspect of the left lung base. The heart size and mediastinal contours are within normal limits. There is tortuosity of the descending thoracic aorta. The visualized skeletal structures are unremarkable. IMPRESSION: No active disease. Electronically Signed   By: Virgina Norfolk M.D.   On: 09/30/2019 18:57    Procedures Procedures (including critical care time)  Medications Ordered in ED Medications  sodium chloride flush (NS) 0.9 % injection 3 mL (has no administration in time range)  sodium chloride 0.9 % bolus 1,000 mL (0 mLs Intravenous Stopped 09/30/19 2015)  LORazepam (ATIVAN) injection 1 mg (1 mg Intravenous Given 09/30/19 2015)    ED Course  I have reviewed the triage vital signs and the nursing notes.  Pertinent labs & imaging results that were available during my care of the patient were reviewed by me and considered in my medical decision making (see chart for details).    MDM Rules/Calculators/A&P                      QUENCY KITZINGER is a 64 year old male with no significant medical history who presents to the ED with generalized weakness.  Patient with unremarkable vitals. No fever.  Patient has had generalized weakness for the last several weeks.  Family concerned about a stroke.  History of stroke in the past.  He has had intermittent weakness in all extremities.  No speech changes.  Family also concern for urinary tract infection.  Patient lives by himself.  Family is in the works in getting physical therapy for him at home.  Overall he appears deconditioned.  Neurologically he appears intact.  Work-up for generalized weakness was initiated and was overall unremarkable.  CT scan of the head and MRI of the brain showed no acute findings.  No acute  stroke or head bleed.  No urinary tract infection.  No significant anemia, electrolyte abnormality, kidney injury.  Chest x-ray without signs of infection.  Case management was consulted and will arrange for home health and home aide.  Possibly some other type of neuromuscular disorder, possibly Parkinson's, possibly some dementia.  We will have him seen by neurology outpatient for further work-up.  Suspect that he may need more full-time aide and to discuss that with primary care.  Discharged in good condition.  Given return precautions.  This chart was dictated using voice recognition software.  Despite best efforts to proofread,  errors can occur which can change the documentation meaning.    Final Clinical Impression(s) / ED Diagnoses Final diagnoses:  Weakness    Rx / DC Orders ED Discharge Orders         Ordered    Ambulatory referral to Neurology    Comments: An appointment is requested in approximately: 1 week   09/30/19 [place], [place], DO 09/30/19 2325

## 2019-09-30 NOTE — ED Triage Notes (Signed)
To ED for eval of increased weakness over the past 2 weeks. Pt and family both state pt had possible stroke 6-8 months ago but unknown true dx. Pt and wife state pts upper extremities are weaker and he's leaning to the right. Pt gcs 15. Upper extremity strength seems weak but equal. Speech sounds weak but clear. Pt denies pain. Wife states she has to feed pt due to upper extremity shaking - intermittently.

## 2019-09-30 NOTE — Care Management (Signed)
ED CM and CSW met patient at bedside, patient states, spouse went out to get something to eat, and will return soon,  [facility] staff will come back once wife returns to assist with care coordination. Updated ED nursing staff.

## 2019-10-15 ENCOUNTER — Other Ambulatory Visit: Payer: Self-pay

## 2019-10-15 ENCOUNTER — Encounter: Payer: Self-pay | Admitting: Adult Health

## 2019-10-15 ENCOUNTER — Ambulatory Visit (INDEPENDENT_AMBULATORY_CARE_PROVIDER_SITE_OTHER): Payer: Medicare Other | Admitting: Adult Health

## 2019-10-15 VITALS — BP 113/74 | HR 76 | Temp 97.3°F | Wt 154.0 lb

## 2019-10-15 DIAGNOSIS — R531 Weakness: Secondary | ICD-10-CM | POA: Diagnosis not present

## 2019-10-15 DIAGNOSIS — F332 Major depressive disorder, recurrent severe without psychotic features: Secondary | ICD-10-CM

## 2019-10-15 DIAGNOSIS — E559 Vitamin D deficiency, unspecified: Secondary | ICD-10-CM

## 2019-10-15 DIAGNOSIS — R2689 Other abnormalities of gait and mobility: Secondary | ICD-10-CM | POA: Diagnosis not present

## 2019-10-15 DIAGNOSIS — E538 Deficiency of other specified B group vitamins: Secondary | ICD-10-CM

## 2019-10-15 DIAGNOSIS — I63531 Cerebral infarction due to unspecified occlusion or stenosis of right posterior cerebral artery: Secondary | ICD-10-CM

## 2019-10-15 DIAGNOSIS — R748 Abnormal levels of other serum enzymes: Secondary | ICD-10-CM

## 2019-10-15 DIAGNOSIS — R251 Tremor, unspecified: Secondary | ICD-10-CM

## 2019-10-15 DIAGNOSIS — R258 Other abnormal involuntary movements: Secondary | ICD-10-CM

## 2019-10-15 NOTE — Patient Instructions (Signed)
Weakness Weakness is a lack of strength. You may feel weak all over your body (generalized), or you may feel weak in one part of your body (focal). There are many potential causes of weakness. Sometimes, the cause of your weakness may not be known. Some causes of weakness can be serious, so it is important to see your doctor. Follow these instructions at home: Activity  Rest as needed.  Try to get enough sleep. Most adults need 7-8 hours of sleep each night. Talk to your doctor about how much sleep you need each night.  Do exercises, such as arm curls and leg raises, for 30 minutes at least 2 days a week or as told by your doctor.  Think about working with a physical therapist or trainer to help you get stronger. General instructions   Take over-the-counter and prescription medicines only as told by your doctor.  Eat a healthy, well-balanced diet. This includes: ? Proteins to build muscles, such as lean meats and fish. ? Fresh fruits and vegetables. ? Carbohydrates to boost energy, such as whole grains.  Drink enough fluid to keep your pee (urine) pale yellow.  Keep all follow-up visits as told by your doctor. This is important. Contact a doctor if:  Your weakness does not get better or it gets worse.  Your weakness affects your ability to: ? Think clearly. ? Do your normal daily activities. Get help right away if you:  Have sudden weakness on one side of your face or body.  Have chest pain.  Have trouble breathing or shortness of breath.  Have problems with your vision.  Have trouble talking or swallowing.  Have trouble standing or walking.  Are light-headed.  Pass out (lose consciousness). Summary  Weakness is a lack of strength. You may feel weak all over your body or just in one part of your body.  There are many potential causes of weakness. Sometimes, the cause of your weakness may not be known.  Rest as needed, and try to get enough sleep. Most adults  need 7-8 hours of sleep each night.  Eat a healthy, well-balanced diet. This information is not intended to replace advice given to you by your health care provider. Make sure you discuss any questions you have with your health care provider. Document Revised: 01/31/2018 Document Reviewed: 01/31/2018 Elsevier Patient Education  2020 [employer]  

## 2019-10-15 NOTE — Progress Notes (Signed)
New patient visit      Patient: [name]   DOB: 27-Apr-1956   64 y.o. Male  MRN: [id number] Visit Date: 10/15/2019  Today's healthcare provider: Marcille Buffy, FNP  Subjective:    Chief Complaint  Patient presents with  . New Patient (Initial Visit)    [name] is a 64 y.o. male who presents today as a new patient to establish care.  HPI   Follow up Hospitalization  Patient was admitted to [facility] on 3/22 and discharged on 3/22. He was treated for weakness. Treatment for this included labs, ct scan, ekg and ordered home health nurse & referral to neurology  Work up was negative essentially.  He reports fair compliance with treatment. He reports this condition is Unchanged.  He was getting weaker and was seen in the ER on 09/30/2019.  He has appointment for new patient with Dr. Brigitte Pulse 11/06/2019   [name] Space [phone] wife - seperated but patient has her on contact list and would like her called he reports. She and Yolanda Bonine are listed on contacts approved by patient and patient reports they care for him at home.  He lives alone however his grandson lives near him.   Appetite is good.  He is unable to care for himself.  His ex - wife [name]  is with him for today's visit and she cares for him. He can not sleep in his bed. She is wondering about a hospital bed.   He is wanting home health aide at home to help with care.   Urinary urgency at times.  Tremor in right hand. Denies any dysuria.  He is wearing diapers. Reports history of shuffled gait.   [facility] home [facility] is doing Physical therapy and occupational therapist at home.   He has smoked at 64 years old. Denies any smoking since. Denies any drug use. Denies any alcohol use.  He is using wheel chair  To get out of house. He is walking some in kitchen or bathroom. He has not had any falls. He occasionally uses a walker.  History of major depression and was treated with ECT previously.    Denies any suicidal or homicidal ideations.    He has a sore on his buttock he has seen wound center for in past, 2 inches down on left buttocks. It is better than it was but they would like a wound clinic evaluation.   Bowel movements normal. Denies any constipation of bleeding. Denies any dark stools.   Denies any dizziness, lightheadedness, pre-  Syncope or syncope. Denies any head trauma or falls.  Denies any hematuria or trauma.   Patient  denies any fever, body aches,chills, rash, chest pain, shortness of breath, nausea, vomiting, or diarrhea.    ------------------------------------------------------------------------------------    Past Medical History:  Diagnosis Date  . Depression   . Prostate disease    Past Surgical History:  Procedure Laterality Date  . COLONOSCOPY WITH PROPOFOL N/A 03/22/2018   Procedure: COLONOSCOPY WITH PROPOFOL;  Surgeon: Jonathon Bellows, MD;  Location: [facility] ENDOSCOPY;  Service: Gastroenterology;  Laterality: N/A;  . HERNIA REPAIR     No family status information on file.   No family history on file. Social History   Socioeconomic History  . Marital status: Married    Spouse name: Reitta  . Number of children: 1  . Years of education: Not on file  . Highest education level: Not on file  Occupational History  . Occupation:  retired  Tobacco Use  . Smoking status: Former Research scientist (life sciences)  . Smokeless tobacco: Former Systems developer    Quit date: 01/10/2017  Substance and Sexual Activity  . Alcohol use: Not Currently  . Drug use: Not Currently  . Sexual activity: Not Currently  Other Topics Concern  . Not on file  Social History Narrative  . Not on file   Social Determinants of Health   Financial Resource Strain:   . Difficulty of Paying Living Expenses:   Food Insecurity:   . Worried About Charity fundraiser in the Last Year:   . Arboriculturist in the Last Year:   Transportation Needs:   . Film/video editor (Medical):   [name] Lack of  Transportation (Non-Medical):   Physical Activity:   . Days of Exercise per Week:   . Minutes of Exercise per Session:   Stress:   . Feeling of Stress :   Social Connections:   . Frequency of Communication with Friends and Family:   . Frequency of Social Gatherings with Friends and Family:   . Attends Religious Services:   . Active Member of Clubs or Organizations:   . Attends Archivist Meetings:   [name] Marital Status:    Outpatient Medications Prior to Visit  Medication Sig  . Cholecalciferol (VITAMIN D3) 1.25 MG (50000 UT) CAPS Take 50,000 Units by mouth every Monday.   . mirtazapine (REMERON) 45 MG tablet TAKE 1 TABLET BY MOUTH AT BEDTIME (Patient not taking: Reported on 09/30/2019)  . PRESCRIPTION MEDICATION Place 1 patch onto the skin daily as needed (wound care). Silvercel patches  . tadalafil (CIALIS) 10 MG tablet Take 0.5-1 tablets (5-10 mg total) by mouth daily as needed for erectile dysfunction. (Patient not taking: Reported on 12/19/2018)  . traZODone (DESYREL) 100 MG tablet Take 1 tablet (100 mg total) by mouth at bedtime as needed for sleep. (Patient not taking: Reported on 12/19/2018)   No facility-administered medications prior to visit.   No Known Allergies  Patient Care Team: Demon Volante, Kelby Aline, FNP as PCP - General (Family Medicine)  Review of Systems  Constitutional: Positive for fever.  HENT: Negative.   Eyes: Negative.        Cataracts per patient, he sees opthalmology denies any change.   Respiratory: Negative.   Cardiovascular: Negative.   Gastrointestinal: Negative.   Endocrine: Negative.   Genitourinary: Positive for frequency. Negative for difficulty urinating, dysuria, enuresis, flank pain, genital sores and hematuria.  Musculoskeletal: Positive for gait problem (shuffle gait at times, denies any falls. ). Negative for arthralgias.  Skin: Positive for wound (left buttock, sees wound center needs refferal to go back. ). Negative for color  change.  Allergic/Immunologic: Negative.   Neurological: Positive for tremors and weakness. Negative for dizziness, seizures, syncope, facial asymmetry, speech difficulty, light-headedness, numbness and headaches.  Hematological: Negative.   Psychiatric/Behavioral: Positive for dysphoric mood. Negative for agitation, behavioral problems, confusion and decreased concentration.       Slow movements     Last CBC Lab Results  Component Value Date   WBC 5.9 09/30/2019   HGB 13.6 09/30/2019   HCT 41.9 09/30/2019   MCV 94.8 09/30/2019   MCH 30.8 09/30/2019   RDW 12.3 09/30/2019   PLT 203 [id number]   Last metabolic panel Lab Results  Component Value Date   GLUCOSE 92 09/30/2019   NA 137 09/30/2019   K 3.6 09/30/2019   CL 101 09/30/2019   CO2 26 09/30/2019   BUN  22 09/30/2019   CREATININE 0.97 09/30/2019   GFRNONAA >60 09/30/2019   GFRAA >60 09/30/2019   CALCIUM 8.8 (L) 09/30/2019   PROT 7.1 09/30/2019   ALBUMIN 3.8 09/30/2019   BILITOT 1.2 09/30/2019   ALKPHOS 58 09/30/2019   AST 19 09/30/2019   ALT 13 09/30/2019   ANIONGAP 10 09/30/2019   Last lipids Lab Results  Component Value Date   CHOL 176 03/14/2018   HDL 52 03/14/2018   LDLCALC 107 (H) 03/14/2018   TRIG 77 03/14/2018   CHOLHDL 3.4 03/14/2018   Last hemoglobin A1c No results found for: HGBA1C Last thyroid functions Lab Results  Component Value Date   TSH 2.553 01/08/2018   T3TOTAL 95 01/09/2018   Last vitamin D Lab Results  Component Value Date   VD25OH 21 (L) 03/14/2018   Last vitamin B12 and Folate Lab Results  Component Value Date   VITAMINB12 566 03/14/2018       Objective:     Vitals:   10/15/19 1515  BP: 113/74  Pulse: 76  Temp: (!) 97.3 F (36.3 C)  TempSrc: Temporal  Weight: 154 lb (69.9 kg)  Body mass index is 24.12 kg/m.  Physical Exam Vitals reviewed.  Constitutional:      General: He is not in acute distress.    Appearance: He is ill-appearing. He is not toxic-appearing  or diaphoretic.     Comments: mildly cachetic in appearance   HENT:     Head: Normocephalic and atraumatic.     Right Ear: Tympanic membrane, ear canal and external ear normal. There is no impacted cerumen.     Left Ear: Tympanic membrane, ear canal and external ear normal. There is impacted cerumen (small amount ).     Nose: Nose normal. No congestion or rhinorrhea.     Mouth/Throat:     Mouth: Mucous membranes are moist.     Pharynx: Uvula midline. No pharyngeal swelling, oropharyngeal exudate, posterior oropharyngeal erythema or uvula swelling.     Comments: Upon oral examination tongue tremor is noted  Eyes:     General: No visual field deficit or scleral icterus.       Right eye: No discharge.        Left eye: No discharge.     Extraocular Movements: Extraocular movements intact.     Conjunctiva/sclera: Conjunctivae normal.     Pupils: Pupils are equal, round, and reactive to light.  Neck:     Vascular: No carotid bruit.  Cardiovascular:     Rate and Rhythm: Normal rate and regular rhythm.  Pulmonary:     Effort: Pulmonary effort is normal. No respiratory distress.     Breath sounds: Normal breath sounds. No stridor. No wheezing, rhonchi or rales.  Chest:     Chest wall: No tenderness.  Abdominal:     General: There is no distension.     Palpations: Abdomen is soft. There is no mass.     Tenderness: There is no abdominal tenderness. There is no right CVA tenderness, left CVA tenderness, guarding or rebound.     Hernia: No hernia is present.  Genitourinary:    Comments: Declined  Musculoskeletal:     Cervical back: Normal range of motion and neck supple. No rigidity or tenderness.     Right lower leg: Edema (trace non pitting bilateral  ankle edema. ) present.     Left lower leg: Edema present.     Comments: Mild muscle rigidity. Slowed movements at times, with head and  upper extremities.   Upper extremities strength 4/5 bilateral and lower extremities strength 4/5  bilateral   Lymphadenopathy:     Cervical: No cervical adenopathy.  Skin:    Capillary Refill: Capillary refill takes less than 2 seconds.     Findings: Lesion (pressure wound left buttock, bandage intact, just changed and prefers not to be removed. Denies any drainage or spreading erythema. Denies warmth. ) present.  Neurological:     General: No focal deficit present.     Mental Status: He is oriented to person, place, and time.     GCS: GCS eye subscore is 4. GCS verbal subscore is 5. GCS motor subscore is 6.     Cranial Nerves: No cranial nerve deficit, dysarthria or facial asymmetry.     Sensory: Sensation is intact. No sensory deficit.     Motor: Weakness and tremor (noted right arm/ hand intermittently duriing visit. ) present.     Coordination: Finger-Nose-Finger Test abnormal.     Deep Tendon Reflexes:     Reflex Scores:      Tricep reflexes are 2+ on the right side and 2+ on the left side.      Brachioradialis reflexes are 2+ on the right side and 2+ on the left side.      Patellar reflexes are 2+ on the right side and 2+ on the left side.    Comments: unable to stand to ambulate in office today.  Keeps head down during visit, does not lift neck up to make eye contact very often during visit.       Depression screen [facility] 2/9 10/15/2019 06/21/2019 04/04/2018 03/26/2018 03/14/2018  Decreased Interest 0 3 1 2 3   Down, Depressed, Hopeless 2 3 0 0 1  PHQ - 2 Score 2 6 1 2 4   Altered sleeping 0 3 0 0 0  Tired, decreased energy 3 3 1 1 2   Change in appetite 0 3 0 0 0  Feeling bad or failure about yourself  0 3 0 0 1  Trouble concentrating 0 3 1 1 2   Moving slowly or fidgety/restless 2 3 0 0 0  Suicidal thoughts 0 0 0 0 -  PHQ-9 Score 7 24 3 4 9   Difficult doing work/chores Very difficult Very difficult Not difficult at all Somewhat difficult Somewhat difficult    No flowsheet data found.     No results found for any visits on 10/15/19.    Assessment & Plan:   Concern for  Parkinson's given tongue tremor, rigidity of muscles and tremor. He has appointment with Dr. Brigitte Pulse in neurology end of April.  Also can not exclude some psychological component he has had major depression in past and did ECT treatment with DR. Clapps in past at [facility].he declined psychology refferal reported he does not have any depression or anxiety issues.   He has also been referred to [place] care manager as well as Surveyor, mining.  If any symptoms worsen or change they will inform office, may need to have appointment moved to sooner date if able.  Advised of emergent symptoms and when to seek care immediately.     Refer to home health for nursing assistance/ evaluation. Will go back to wound clinic for left buttock pressure wound and this may be able to be managed by home health nurse as well. He is unable to leave the house without assistance of friends and family and they report even this is difficult.  Vitamin D insufficiency -  Plan: CBC with Differential/Platelet, Comprehensive Metabolic Panel (CMET), TSH, Lipid Profile, PSA, CK (Creatine Kinase), Hepatitis c antibody (reflex), Hepatitis panel, acute, HIV antibody (with reflex), RPR, B12 and Folate Panel, CK (Creatine Kinase), Acetylcholine receptor, binding, Striated muscle antibody, AMB referral to wound care center, CANCELED: AMB referral to wound care center  Weakness - Plan: CBC with Differential/Platelet, Comprehensive Metabolic Panel (CMET), TSH, Lipid Profile, PSA, CK (Creatine Kinase), Hepatitis c antibody (reflex), Hepatitis panel, acute, HIV antibody (with reflex), RPR, B12 and Folate Panel, CK (Creatine Kinase), Acetylcholine receptor, binding, Striated muscle antibody, AMB referral to wound care center, Ambulatory referral to [place], CANCELED: AMB referral to wound care center  Shuffling gait - Plan: CBC with Differential/Platelet, Comprehensive Metabolic Panel (CMET), TSH, Lipid Profile, PSA, CK (Creatine  Kinase), Hepatitis c antibody (reflex), Hepatitis panel, acute, HIV antibody (with reflex), RPR, B12 and Folate Panel, CK (Creatine Kinase), Acetylcholine receptor, binding, Striated muscle antibody, AMB referral to wound care center, Ambulatory referral to [place], CANCELED: AMB referral to wound care center  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 ([place]) - Plan: CBC with Differential/Platelet, Comprehensive Metabolic Panel (CMET), TSH, Lipid Profile, PSA, CK (Creatine Kinase), Hepatitis c antibody (reflex), Hepatitis panel, acute, HIV antibody (with reflex), RPR, B12 and Folate Panel, CK (Creatine Kinase), Acetylcholine receptor, binding, Striated muscle antibody, AMB referral to wound care center, Ambulatory referral to [place], CANCELED: AMB referral to wound care center  B12 deficiency - Plan: CBC with Differential/Platelet, Comprehensive Metabolic Panel (CMET), TSH, Lipid Profile, PSA, CK (Creatine Kinase), Hepatitis c antibody (reflex), Hepatitis panel, acute, HIV antibody (with reflex), RPR, B12 and Folate Panel, CK (Creatine Kinase), Acetylcholine receptor, binding, Striated muscle antibody, AMB referral to wound care center, CANCELED: AMB referral to wound care center  Severe episode of recurrent major depressive disorder, without psychotic features ([place]) - Plan: CBC with Differential/Platelet, Comprehensive Metabolic Panel (CMET), TSH, Lipid Profile, PSA, CK (Creatine Kinase), Hepatitis c antibody (reflex), Hepatitis panel, acute, HIV antibody (with reflex), RPR, B12 and Folate Panel, CK (Creatine Kinase), Acetylcholine receptor, binding, Striated muscle antibody, AMB referral to wound care center, CANCELED: AMB referral to wound care center  Elevated CK - Plan: CBC with Differential/Platelet, Comprehensive Metabolic Panel (CMET), TSH, Lipid Profile, PSA, CK (Creatine Kinase), Hepatitis c antibody (reflex), Hepatitis panel, acute, HIV antibody (with  reflex), RPR, B12 and Folate Panel, CK (Creatine Kinase), Acetylcholine receptor, binding, Striated muscle antibody, AMB referral to wound care center, CANCELED: AMB referral to wound care center  Tremor - Plan: Ambulatory referral to [place] - Plan: Ambulatory referral to [place]   Orders Placed This Encounter  Procedures  . CBC with Differential/Platelet  . Comprehensive Metabolic Panel (CMET)  . TSH  . Lipid Profile  . PSA  . CK (Creatine Kinase)  . Hepatitis c antibody (reflex)  . Hepatitis panel, acute  . HIV antibody (with reflex)  . RPR  . B12 and Folate Panel  . CK (Creatine Kinase)  . Acetylcholine receptor, binding  . Striated muscle antibody  . AMB referral to wound care center  . Ambulatory referral to [place]   He does not want to take any oral medications at this time.   Will check labs and evaluate again after.   He is advised of RED flag stroke symptoms and red flag symptoms and when to seek care immediately in emergency room.   Return in about 3 weeks (around 11/05/2019), or if symptoms worsen or fail to improve, for  at any time for any worsening symptoms, Go to Emergency room/ urgent care if worse.  Advised patient call the office or your primary care doctor for an appointment if no improvement within 72 hours or if any symptoms change or worsen at any time  Advised ER or urgent Care if after hours or on weekend. Call 911 for emergency symptoms at any time.Patinet verbalized understanding of all instructions given/reviewed and treatment plan and has no further questions or concerns at this time.      The entirety of the information documented in the History of Present Illness, Review of Systems and Physical Exam were personally obtained by me. Portions of this information were initially documented by the  Certified Medical Assistant whose name is documented in [place] and reviewed by me for thoroughness and accuracy.  I have personally  performed the exam and reviewed the chart and it is accurate to the best of my knowledge.  Haematologist has been used and any errors in dictation or transcription are unintentional.  Kelby Aline. [place], [place] [phone] (phone) [phone] (fax)  [place]

## 2019-10-16 ENCOUNTER — Telehealth: Payer: Self-pay

## 2019-10-16 LAB — CK: Total CK: 157 U/L (ref 41–331)

## 2019-10-16 NOTE — Telephone Encounter (Signed)
Copied from [place] [id number]. Topic: General - Other >> Oct 16, 2019  9:57 AM Parke Poisson wrote: Reason for CRM: [facility] agency will not be able to start services until 10/21/19,maybe 10/22/19

## 2019-10-16 NOTE — Telephone Encounter (Signed)
Patient's wife [name] advised.

## 2019-10-16 NOTE — Telephone Encounter (Signed)
That is ok. Please notify patient's contact [name] on file of this.   Thank you,  Laverna Peace MSN, AGNP-C, FNP-C  Family Nurse Practitioner  Adult Geriatric Nurse Practitioner

## 2019-10-17 ENCOUNTER — Telehealth: Payer: Self-pay | Admitting: Adult Health

## 2019-10-17 ENCOUNTER — Ambulatory Visit: Payer: Self-pay

## 2019-10-17 ENCOUNTER — Telehealth: Payer: Medicare Other

## 2019-10-17 NOTE — Chronic Care Management (AMB) (Signed)
  Chronic Care Management   Outreach Note  10/17/2019 Name: [name] MRN: [id number] DOB: 1956-03-31  [name] Lumley is a 64 y.o. year old male who is a primary care patient of Flinchum, Kelby Aline, FNP. I reached out to [employer] by phone today in response to a referral sent by Mr. Hershey Curcio [place]'s PCP, Laverna Peace, FNP     An unsuccessful telephone outreach was attempted today. The patient was referred to the case management team for assistance with care management and care coordination.   Follow Up Plan: A HIPPA compliant phone message was left for the patient providing contact information and requesting a return call. The care management team will reach out to the patient again over the next 7 days. If patient returns call to provider office, please advise to call [place]* at [phone].  [place], [place] [address] Direct Dial: [phone] [email or web address]

## 2019-10-17 NOTE — Progress Notes (Signed)
Inform patient/ or [name] contact on file that lab work is within normal limits, one test is still pending for myasthenia gravis, it takes a few extra days and we will call if it is abnormal.  Kidney, liver, electrolytes within normal range. Anemia, or signs of infection not seen. Thyroid within normal limits. Cholesterol and prostate antigen is  within normal limits. Total CK is normal.  Acute hepatitis panel is negative for A, B and C. HIV, syphilis screen negative. B12, folate within normal limits. Hepatitis screening is negative.   Keep follow up appointment, they should have heard from home health nurse at this point, and keep neurology appointment, follow up sooner if any symptoms worsening.

## 2019-10-17 NOTE — Chronic Care Management (AMB) (Signed)
  Chronic Care Management   Outreach Note  10/17/2019 Name: GUMESINDO BLANCETT MRN: [id number] DOB: 06/04/1956  Primary Care Provider: Doreen Beam, FNP Reason for referral : Chronic Care Management   An unsuccessful telephone outreach was attempted today. Mr. Feulner was referred to the case management team for assistance with care management and care coordination.    Follow Up Plan:  The care management team will follow up again next week.    [facility] Care Management [phone]

## 2019-10-21 ENCOUNTER — Telehealth: Payer: Self-pay

## 2019-10-21 ENCOUNTER — Ambulatory Visit: Payer: Medicare Other | Admitting: Physician Assistant

## 2019-10-21 NOTE — Telephone Encounter (Signed)
Home health RN was ordered and he was to have assessment yet in home . Have not seen home health assessment yet in home

## 2019-10-21 NOTE — Telephone Encounter (Signed)
Copied from [place] [id number]. Topic: General - Other >> Oct 21, 2019  2:20 PM Leward Quan A wrote: Reason for CRM: Patient wife Reita called to inquire about the hospital bed that was discussed at last visit on 10/15/19. She is needing to know the status of the order if it was placed and where also expected date of delivery. Mrs Baine can be reached at Ph# [phone]

## 2019-10-21 NOTE — Telephone Encounter (Signed)
Are you aware of order? KW

## 2019-10-22 ENCOUNTER — Telehealth: Payer: Self-pay

## 2019-10-22 LAB — COMPREHENSIVE METABOLIC PANEL
ALT: 9 IU/L (ref 0–44)
AST: 15 IU/L (ref 0–40)
Albumin/Globulin Ratio: 1.8 (ref 1.2–2.2)
Albumin: 4.5 g/dL (ref 3.8–4.8)
Alkaline Phosphatase: 96 IU/L (ref 39–117)
BUN/Creatinine Ratio: 26 — ABNORMAL HIGH (ref 10–24)
BUN: 25 mg/dL (ref 8–27)
Bilirubin Total: 1 mg/dL (ref 0.0–1.2)
CO2: 23 mmol/L (ref 20–29)
Calcium: 9.4 mg/dL (ref 8.6–10.2)
Chloride: 104 mmol/L (ref 96–106)
Creatinine, Ser: 0.97 mg/dL (ref 0.76–1.27)
GFR calc Af Amer: 94 mL/min/{1.73_m2} (ref >59)
GFR calc non Af Amer: 82 mL/min/{1.73_m2} (ref >59)
Globulin, Total: 2.5 g/dL (ref 1.5–4.5)
Glucose: 92 mg/dL (ref 65–99)
Potassium: 4.2 mmol/L (ref 3.5–5.2)
Sodium: 143 mmol/L (ref 134–144)
Total Protein: 7 g/dL (ref 6.0–8.5)

## 2019-10-22 LAB — HEPATITIS PANEL, ACUTE
Hep A IgM: NEGATIVE
Hep B C IgM: NEGATIVE
Hep C Virus Ab: <0.1 s/co ratio (ref 0.0–0.9)
Hepatitis B Surface Ag: NEGATIVE

## 2019-10-22 LAB — CBC WITH DIFFERENTIAL/PLATELET
Basophils Absolute: 0 10*3/uL (ref 0.0–0.2)
Basos: 1 %
EOS (ABSOLUTE): 0.1 10*3/uL (ref 0.0–0.4)
Eos: 1 %
Hematocrit: 39 % (ref 37.5–51.0)
Hemoglobin: 13 g/dL (ref 13.0–17.7)
Immature Grans (Abs): 0 10*3/uL (ref 0.0–0.1)
Immature Granulocytes: 0 %
Lymphocytes Absolute: 1.2 10*3/uL (ref 0.7–3.1)
Lymphs: 23 %
MCH: 30.9 pg (ref 26.6–33.0)
MCHC: 33.3 g/dL (ref 31.5–35.7)
MCV: 93 fL (ref 79–97)
Monocytes Absolute: 0.4 10*3/uL (ref 0.1–0.9)
Monocytes: 7 %
Neutrophils Absolute: 3.6 10*3/uL (ref 1.4–7.0)
Neutrophils: 68 %
Platelets: 190 10*3/uL (ref 150–450)
RBC: 4.21 x10E6/uL (ref 4.14–5.80)
RDW: 12.3 % (ref 11.6–15.4)
WBC: 5.3 10*3/uL (ref 3.4–10.8)

## 2019-10-22 LAB — B12 AND FOLATE PANEL
Folate: 10.9 ng/mL (ref >3.0)
Vitamin B-12: 297 pg/mL (ref 232–1245)

## 2019-10-22 LAB — HCV COMMENT:

## 2019-10-22 LAB — ACETYLCHOLINE RECEPTOR, BINDING: AChR Binding Ab, Serum: <0.03 nmol/L (ref 0.00–0.24)

## 2019-10-22 LAB — LIPID PANEL
Chol/HDL Ratio: 3.1 ratio (ref 0.0–5.0)
Cholesterol, Total: 150 mg/dL (ref 100–199)
HDL: 48 mg/dL (ref >39)
LDL Chol Calc (NIH): 86 mg/dL (ref 0–99)
Triglycerides: 82 mg/dL (ref 0–149)
VLDL Cholesterol Cal: 16 mg/dL (ref 5–40)

## 2019-10-22 LAB — TSH: TSH: 1.54 u[IU]/mL (ref 0.450–4.500)

## 2019-10-22 LAB — STRIATED MUSCLE ANTIBODY: Anti-striation Abs: NEGATIVE

## 2019-10-22 LAB — HEPATITIS C ANTIBODY (REFLEX): HCV Ab: <0.1 s/co ratio (ref 0.0–0.9)

## 2019-10-22 LAB — CK: Total CK: 161 U/L (ref 41–331)

## 2019-10-22 LAB — HIV ANTIBODY (ROUTINE TESTING W REFLEX): HIV Screen 4th Generation wRfx: NONREACTIVE

## 2019-10-22 LAB — PSA: Prostate Specific Ag, Serum: 1.9 ng/mL (ref 0.0–4.0)

## 2019-10-22 LAB — RPR: RPR Ser Ql: NONREACTIVE

## 2019-10-22 NOTE — Telephone Encounter (Signed)
Yes.  Thank you.

## 2019-10-22 NOTE — Telephone Encounter (Signed)
Please advise 

## 2019-10-22 NOTE — Telephone Encounter (Signed)
Gave verbally order of approval to [facility] from [facility]. I just wanted to addend the message below for PT it should have been written:  PT orders for frequency 2x3, 1x3

## 2019-10-22 NOTE — Telephone Encounter (Signed)
Verbal order ok for OT and social worker evaluation.  Patient's contact was questioning hospital bed, would like PT input and evaluation and recommendations sent.   Patient has been advised to take medications, however it was reported he was not taking them at last office visit, he refused to take them per care giver.

## 2019-10-22 NOTE — Telephone Encounter (Signed)
Copied from [place] [id number]. Topic: General - Other >> Oct 22, 2019 11:02 AM Oneta Rack wrote: Berton Lan physical therapist from [facility] # [phone] Reasoning for call: Requesting verbal orders for    OT and Social Worker Evaluation.  PT orders for frequency 2x 4 1x 3  Also PT wanted  to inform PCP patient is not taking any of his medication and did not state why.    Please leave detail message

## 2019-10-22 NOTE — Telephone Encounter (Addendum)
Detailed message for wife letting her know that order was put in, I left the phone number to our referral department for patient to inquire if she has any further questions of appt time. KW

## 2019-10-22 NOTE — Progress Notes (Signed)
Remainder of labs resulted within normal limits.

## 2019-10-23 ENCOUNTER — Telehealth: Payer: Self-pay

## 2019-10-23 NOTE — Chronic Care Management (AMB) (Signed)
  Chronic Care Management   Outreach Note  10/23/2019 Name: [name] MRN: [id number] DOB: 1955/10/15  [name] is a 64 y.o. year old male who is a primary care patient of Flinchum, Kelby Aline, FNP. I reached out to [employer] by phone today in response to a referral sent by Mr. [name]'s PCP, Laverna Peace, FNP.     Referral for CM support from RN Case Manager, Pharmacist, and Licensed Clinical Social Worker reviewed.  Referral priority indicated: Urgent (anticipate clinician appointment scheduling with 5 business days; please call clinician to expedite as needed) Plan: Telephone outreach attempted. Initial outreach with clinician will be scheduled after patient is engaged by a member of the embedded care management team to introduce services and gain consent. Three or more unsuccessful contact attempts have been made and the provider has been notified. The care management team will attempt to engage the patient again when/if another referral is made.    Follow Up Plan: The care management team is available to follow up with the patient after provider conversation with the patient regarding recommendation for care management engagement and subsequent re-referral to the care management team.  If patient returns call to provider office, please advise to call [place] at [phone].  [place], [place] [address] Direct Dial: [phone] [email or web address] Website: [email or web address]

## 2019-10-23 NOTE — Telephone Encounter (Signed)
Patient wants contact [name] called. On file in [place].  Provider did discuss the CCM management with patient and [name] patients spouse/ contact on DPR.  Please notify patient that CCM is trying to reach them to assist with their care and advise them to call the number in the chart noted in the note below.

## 2019-10-23 NOTE — Telephone Encounter (Signed)
-----   Message from Doreen Beam, [place] sent at 10/23/2019 10:29 AM EDT -----   ----- Message ----- From: Osvaldo Shipper, NT Sent: 10/23/2019  10:12 AM EDT To: Doreen Beam, FNP

## 2019-10-24 ENCOUNTER — Telehealth: Payer: Self-pay | Admitting: Adult Health

## 2019-10-24 ENCOUNTER — Telehealth: Payer: Medicare Other

## 2019-10-24 ENCOUNTER — Ambulatory Visit: Payer: Self-pay

## 2019-10-24 NOTE — Chronic Care Management (AMB) (Signed)
  Chronic Care Management   Note  10/24/2019 Name: [name] MRN: [id number] DOB: January 26, 1956   Message received from [place] to contact [place] at [phone].  Successful outreach with [name]. HIPAA identifiers for [name] verified. [name] reports [name] may have difficulty answering and returning calls due weakness/difficulty ambulating secondary to stroke. She agreed to report to [name]'s home on 10/31/19 to assist. We will plan to complete a telephonic assessment at that time.    Follow up plan: The care management team will follow up as agreed on 10/31/19.     [name] [name] Care Management [phone]

## 2019-10-24 NOTE — Chronic Care Management (AMB) (Signed)
  Chronic Care Management   Outreach Note  10/24/2019 Name: [name] MRN: [id number] DOB: 01-29-1956  Primary Care Provider: Doreen Beam, FNP Reason for referral : Chronic Care Management   An unsuccessful telephone outreach was attempted today. Mr. Bobek was referred to the case management team for assistance with care management and care coordination.   I was unable to leave a voice message due to [facility] mail box being full.   Follow Up Plan: The care management team will reach out to Mr. [place] again next week.   Horris Latino [facility] Care Management [phone]

## 2019-10-24 NOTE — Chronic Care Management (AMB) (Signed)
  Chronic Care Management   Note  10/24/2019 Name: [name] MRN: [id number] DOB: [birth year]  [name] is a 64 y.o. year old male who is a primary care patient of Flinchum, Kelby Aline, FNP. I reached out to [employer] by phone today in response to a referral sent by [name]'s PCP, Laverna Peace, FNP and health plan.     [name] was given information about Chronic Care Management services today including:  1. CCM service includes personalized support from designated clinical staff supervised by his physician, including individualized plan of care and coordination with other care providers 2. 24/7 contact phone numbers for assistance for urgent and routine care needs. 3. Service will only be billed when office clinical staff spend 20 minutes or more in a month to coordinate care. 4. Only one practitioner may furnish and bill the service in a calendar month. 5. The patient may stop CCM services at any time (effective at the end of the month) by phone call to the office staff. 6. The patient will be responsible for cost sharing (co-pay) of up to 20% of the service fee (after annual deductible is met).  Patient agreed to services and verbal consent obtained.  Referral for CM support from RN Case Manager reviewed.  Referral priority indicated: Urgent (anticipate clinician appointment scheduling with 5 business days; please call clinician to expedite as needed) Plan: Initial outreach scheduled with [place]  on [phone].     Follow up plan: Telephone appointment with care management team member scheduled for: 10/24/2019.  [place], [place] [address] Direct Dial: [phone] [email or web address] Website: [email or web address]

## 2019-10-25 ENCOUNTER — Telehealth: Payer: Self-pay

## 2019-10-25 NOTE — Telephone Encounter (Signed)
Copied from [place] [phone]. Topic: General - Other >> Oct 25, 2019 12:20 PM Sheran Luz wrote: [name] with [facility] calling to inform PCP that patient missed PT visit today due to patient not feeling well.

## 2019-10-31 ENCOUNTER — Ambulatory Visit: Payer: Medicare Other

## 2019-11-13 ENCOUNTER — Other Ambulatory Visit: Payer: Self-pay

## 2019-11-13 ENCOUNTER — Ambulatory Visit (INDEPENDENT_AMBULATORY_CARE_PROVIDER_SITE_OTHER): Payer: Medicare Other | Admitting: Adult Health

## 2019-11-13 ENCOUNTER — Encounter: Payer: Self-pay | Admitting: Adult Health

## 2019-11-13 VITALS — BP 144/90 | HR 69 | Temp 96.8°F | Resp 16

## 2019-11-13 DIAGNOSIS — R2689 Other abnormalities of gait and mobility: Secondary | ICD-10-CM

## 2019-11-13 DIAGNOSIS — I63531 Cerebral infarction due to unspecified occlusion or stenosis of right posterior cerebral artery: Secondary | ICD-10-CM | POA: Diagnosis not present

## 2019-11-13 DIAGNOSIS — E559 Vitamin D deficiency, unspecified: Secondary | ICD-10-CM

## 2019-11-13 DIAGNOSIS — G20 Parkinson's disease: Secondary | ICD-10-CM

## 2019-11-13 DIAGNOSIS — E538 Deficiency of other specified B group vitamins: Secondary | ICD-10-CM

## 2019-11-13 NOTE — Patient Instructions (Signed)
Parkinson's Disease Parkinson's disease causes problems with movements. It is a long-term condition. It gets worse over time (is progressive). It affects each person in different ways. It makes it harder for you to:  Control how your body moves.  Move your body normally. The condition can range from mild to very bad (advanced). What are the causes? This condition results from a loss of brain cells called neurons. These brain cells make a chemical called dopamine, which is needed to control body movement. As the condition gets worse, the brain cells make less dopamine. This makes it hard to move or control your movements. The exact cause of this condition is not known. What increases the risk?  Being male.  Being age 50 or older.  Having family members who had Parkinson's disease.  Having had an injury to the brain.  Being very sad (depressed).  Being around things that are harmful or poisonous. What are the signs or symptoms? Symptoms of this condition can vary. The main symptoms have to do with movement. These include:  A tremor or shaking while you are resting that you cannot control.  Stiffness in your neck, arms, and legs.  Slowing of movement. This may include: ? Losing expressions of the face. ? Having trouble making small movements that are needed to button your clothing or brush your teeth.  Walking in a way that is not normal. You may walk with short, shuffling steps.  Loss of balance when standing. You may sway, fall backward, or have trouble making turns. Other symptoms include:  Being very sad, worried, or confused.  Seeing or hearing things that are not real.  Losing thinking abilities (dementia).  Trouble speaking or swallowing.  Having a hard time pooping (constipation).  Needing to pee right away, peeing often, or not being able to control when you pee or poop.  Sleep problems. How is this treated? There is no cure. The goal of treatment is to  manage your symptoms. Treatment may include:  Medicines.  Therapy to help with talking or movement.  Surgery to reduce shaking and other movements that you cannot control. Follow these instructions at home: Medicines  Take over-the-counter and prescription medicines only as told by your doctor.  Avoid taking pain or sleeping medicines. Eating and drinking  Follow instructions from your doctor about what you cannot eat or drink.  Do not drink alcohol. Activity  Talk with your doctor about if it is safe for you to drive.  Do exercises as told by your doctor. Lifestyle      Put in grab bars and railings in your home. These help to prevent falls.  Do not use any products that contain nicotine or tobacco, such as cigarettes, e-cigarettes, and chewing tobacco. If you need help quitting, ask your doctor.  Join a support group. General instructions  Talk with your doctor about what you need help with and what your safety needs are.  Keep all follow-up visits as told by your doctor, including any therapy visits to help with talking or moving. This is important. Contact a doctor if:  Medicines do not help your symptoms.  You feel off-balance.  You fall at home.  You need more help at home.  You have trouble swallowing.  You have a very hard time pooping.  You have a lot of side effects from your medicines.  You feel very sad, worried, or confused. Get help right away if:  You were hurt in a fall.  You see  or hear things that are not real.  You cannot swallow without choking.  You have chest pain or trouble breathing.  You do not feel safe at home.  You have thoughts about hurting yourself or others. If you ever feel like you may hurt yourself or others, or have thoughts about taking your own life, get help right away. You can go to your nearest emergency department or call:  Your local emergency services (911 in the [place]  A suicide crisis helpline, such  as the National Suicide Prevention Lifeline at 1-800-273-8255. This is open 24 hours a day. Summary  This condition causes problems with movements.  It is a long-term condition. It gets worse over time.  There is no cure. Treatment focuses on managing your symptoms.  Talk with your doctor about what you need help with and what your safety needs are.  Keep all follow-up visits as told by your doctor. This is important. This information is not intended to replace advice given to you by your health care provider. Make sure you discuss any questions you have with your health care provider. Document Revised: 09/13/2018 Document Reviewed: 09/13/2018 Elsevier Patient Education  2020 Elsevier Inc.  

## 2019-11-13 NOTE — Progress Notes (Signed)
Established patient visit   Patient: [name]   DOB: 06-Oct-1955   64 y.o. Male  MRN: [id number] Visit Date: 11/13/2019  Today's healthcare provider: Marcille Buffy, FNP   Chief Complaint  Patient presents with  . Follow-up   Subjective    HPI Follow up for weakness  The patient was last seen for this 3 weeks ago. Changes made at last visit include he was referred to Dr. Manuella Ghazi  for neurology work-up for possible Parkinson's.  He did go to this appointment and Dr. Manuella Ghazi  felt like he does have a early Parkinson syndrome. Patient presents back today for follow up visit, he is accompanied with his wife. Patient was seen at 11/06/19 by neurologist Dr. Manuella Ghazi who diagnosed patient with parkinsonism, he started patient on Sinemet 25-100mg . Wife reports that patient has had fair/poor compliance on medication.   Patient with generalized body weakness, resting tremor, hypophonia, decreased eye blinking and bradykinesia and increased muscle tone.   Patient was started on a half of Sinemet daily for 1 week, he is tolerating it well however he did not take it this morning because he feels like it makes him go to the bathroom previously he did have constipation.  He is advised that his body will likely adjust to the medication as he takes it.  He has a follow-up with Dr. In neurology in 3 months.   Reviewed note from Dr. Manuella Ghazi.   Eating chicken sandwich, and other types of sandwiches daily eating at least twice daily meals.  CNA coming for baths twice a week. Dr. Manuella Ghazi is checking on 24/7 care.  He is trying to get a hospital bed as he has a hard time maneuvering in his bed.  He is unable to get up in the bed.  Physical therapy is in the home and asked to evaluate for hospital bed and send order provider will sign. Denies dizziness lightheadedness syncopal or presyncopal episodes. Patient  denies any fever,chills, rash, chest pain, shortness of breath, nausea, vomiting, or diarrhea.     -----------------------------------------------------------------------------------------   Patient Active Problem List   Diagnosis Date Noted  . Hyperlipidemia 01/10/2019  . 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 Gait difficulty 12/25/2018  . Idiopathic peripheral neuropathy 12/25/2018  . Severe episode of recurrent major depressive disorder, without psychotic features ([place]) 12/21/2018  . Benign prostatic hyperplasia with urinary frequency 04/04/2018  . Erectile dysfunction 04/04/2018  . Vitamin D deficiency 03/14/2018  . Weakness of back 03/14/2018  . Elevated CK 03/14/2018  . B12 deficiency 03/14/2018  . High risk medications (not anticoagulants) long-term use 03/14/2018  . Onychomycosis 03/14/2018  . Catatonia 01/12/2018  . Malnutrition of moderate degree 01/09/2018  . Pressure injury of skin 01/09/2018  . MDD (major depressive disorder), recurrent episode, severe ([place]) 01/09/2018  . Back pain 01/08/2018  . Shuffling gait 01/08/2018   Past Medical History:  Diagnosis Date  . Anxiety   . Cataract   . Depression   . Hyperlipidemia 01/10/2019  . Idiopathic peripheral neuropathy 12/25/2018  . Prostate disease   . Stroke [facility])    Past Surgical History:  Procedure Laterality Date  . COLONOSCOPY WITH PROPOFOL N/A 03/22/2018   Procedure: COLONOSCOPY WITH PROPOFOL;  Surgeon: Jonathon Bellows, MD;  Location: [facility] ENDOSCOPY;  Service: Gastroenterology;  Laterality: N/A;  . HERNIA REPAIR     Social History   Tobacco Use  . Smoking status: Former Research scientist (life sciences)  . Smokeless tobacco:  Former Systems developer    Quit date: 01/10/2017  Substance Use Topics  . Alcohol use: Not Currently  . Drug use: Not Currently   No Known Allergies     Medications: Outpatient Medications Prior to Visit  Medication Sig  . carbidopa-levodopa (SINEMET IR) 25-100 MG tablet 0.5 tab three times a day for one week, then 1 tab three times a day.  .  Cholecalciferol (VITAMIN D3) 1.25 MG (50000 UT) CAPS Take 50,000 Units by mouth every Monday.   . mirtazapine (REMERON) 45 MG tablet TAKE 1 TABLET BY MOUTH AT BEDTIME (Patient not taking: Reported on 09/30/2019)  . PRESCRIPTION MEDICATION Place 1 patch onto the skin daily as needed (wound care). Silvercel patches  . tadalafil (CIALIS) 10 MG tablet Take 0.5-1 tablets (5-10 mg total) by mouth daily as needed for erectile dysfunction. (Patient not taking: Reported on 12/19/2018)  . traZODone (DESYREL) 100 MG tablet Take 1 tablet (100 mg total) by mouth at bedtime as needed for sleep. (Patient not taking: Reported on 12/19/2018)   No facility-administered medications prior to visit.    Review of Systems  Last CBC Lab Results  Component Value Date   WBC 5.3 10/15/2019   HGB 13.0 10/15/2019   HCT 39.0 10/15/2019   MCV 93 10/15/2019   MCH 30.9 10/15/2019   RDW 12.3 10/15/2019   PLT 190 123456   Last metabolic panel Lab Results  Component Value Date   GLUCOSE 92 10/15/2019   NA 143 10/15/2019   K 4.2 10/15/2019   CL 104 10/15/2019   CO2 23 10/15/2019   BUN 25 10/15/2019   CREATININE 0.97 10/15/2019   GFRNONAA 82 10/15/2019   GFRAA 94 10/15/2019   CALCIUM 9.4 10/15/2019   PROT 7.0 10/15/2019   ALBUMIN 4.5 10/15/2019   LABGLOB 2.5 10/15/2019   AGRATIO 1.8 10/15/2019   BILITOT 1.0 10/15/2019   ALKPHOS 96 10/15/2019   AST 15 10/15/2019   ALT 9 10/15/2019   ANIONGAP 10 09/30/2019   Last lipids Lab Results  Component Value Date   CHOL 150 10/15/2019   HDL 48 10/15/2019   LDLCALC 86 10/15/2019   TRIG 82 10/15/2019   CHOLHDL 3.1 10/15/2019   Last hemoglobin A1c No results found for: HGBA1C Last thyroid functions Lab Results  Component Value Date   TSH 1.540 10/15/2019   T3TOTAL 95 01/09/2018   Last vitamin D Lab Results  Component Value Date   VD25OH 21 (L) 03/14/2018      Objective    BP (!) 144/90   Pulse 69   Temp (!) 96.8 F (36 C) (Oral)   Resp 16    SpO2 99%  BP Readings from Last 3 Encounters:  11/13/19 (!) 144/90  10/15/19 113/74  09/30/19 (!) 151/93   Wt Readings from Last 3 Encounters:  10/15/19 154 lb (69.9 kg)  09/30/19 150 lb (68 kg)  12/18/18 160 lb (72.6 kg)   Recheck blood pressure 136/80.  Physical Exam Constitutional:      General: He is not in acute distress.    Appearance: He is not ill-appearing, toxic-appearing or diaphoretic.     Comments: Patient is alert and oriented, he has his wife in the room with him and his caregiver who helps transport him here. Response to provider with normal interaction.  His voice is slowed speech.  This is normal for him.  He is in wheelchair and does not ambulate in the office.  HENT:     Head: Normocephalic and atraumatic.  Comments: Pill-rolling motion of tongue.  Bradykinesia.    Mouth/Throat:     Mouth: Mucous membranes are dry.  Eyes:     Extraocular Movements: Extraocular movements intact.  Cardiovascular:     Rate and Rhythm: Normal rate and regular rhythm.     Pulses: Normal pulses.     Heart sounds: Normal heart sounds. No murmur. No friction rub. No gallop.   Pulmonary:     Effort: Pulmonary effort is normal. No respiratory distress.     Breath sounds: Normal breath sounds. No stridor. No wheezing, rhonchi or rales.  Chest:     Chest wall: No tenderness.  Abdominal:     Palpations: Abdomen is soft.  Musculoskeletal:     Cervical back: Normal range of motion and neck supple.  Lymphadenopathy:     Cervical: No cervical adenopathy.  Skin:    General: Skin is warm and dry.     Capillary Refill: Capillary refill takes less than 2 seconds.     Comments: Wound that is cared for by home health, home health nurse coming today and will review orders.  Denies any signs of infection or erythema.  Bandages intact.  Neurological:     General: No focal deficit present.     Mental Status: He is alert and oriented to person, place, and time.     Gait: Gait abnormal  (Reports shuffled gait.).  Psychiatric:        Mood and Affect: Mood normal.        Thought Content: Thought content normal.        Judgment: Judgment normal.       No results found for any visits on 11/13/19.  Assessment & Plan     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 (HCC)  Parkinson disease ([place])  Shuffling gait  No orders of the defined types were placed in this encounter.  Labs to be done walk in lab in one month.  To take Vitamin B12 1,000 mcg daily per Dr. Manuella Ghazi. Vitamin D due to be rechecked in one month.  Orders Placed This Encounter  Procedures  . VITAMIN D 25 Hydroxy (Vit-D Deficiency, Fractures)    Standing Status:   Future    Standing Expiration Date:   12/14/2019  . CBC with Differential/Platelet    Standing Status:   Future    Standing Expiration Date:   12/14/2019  . Comprehensive Metabolic Panel (CMET)    Standing Status:   Future    Standing Expiration Date:   12/14/2019  . B12    Standing Status:   Future    Standing Expiration Date:   12/14/2019     We will continue to work with home health to line up hospital bed, and increase caregiver services with nursing assistant for bathing.  He needs significant assistance in the home, he lives alone, however his grand son and his wife help with his care.  He did have a few days of actually feeling better on the Sinemet.  We will keep follow-ups with Dr. In neurology and to call the office should he have any questions or concerns.  Patient will follow up with me in 4 months and sooner if needed.  We will need to recheck in his vitamin D level when he is through with the vitamin D 50,000 once a week.  Was prescribed this previously and has a history of vitamin D being low.     IMarcille Buffy, FNP, have reviewed  all documentation for this visit. The documentation on 11/13/19 for the exam, diagnosis, procedures, and orders are all accurate and complete.  Advised patient  call the office or your primary care doctor for an appointment if no improvement within 72 hours or if any symptoms change or worsen at any time  Advised ER or urgent Care if after hours or on weekend. Call 911 for emergency symptoms at any time.Patinet verbalized understanding of all instructions given/reviewed and treatment plan and has no further questions or concerns at this time.    Addressed extensive list of chronic and acute medical problems today requiring 22minutes reviewing his medical record, counseling patient regarding his conditions and coordination of care.     Marcille Buffy, [place] [phone] (phone) [phone] (fax)  [place]

## 2019-11-20 ENCOUNTER — Telehealth: Payer: Self-pay | Admitting: Adult Health

## 2019-11-20 NOTE — Telephone Encounter (Signed)
Home Health Verbal Orders - [place] Number: [id number] Requesting OT/PT/Skilled Nursing/Social Work/Speech Therapy: [name] called and is requested new dressings and barrier cream for buttock sores Frequency: to be determined

## 2019-11-21 NOTE — Telephone Encounter (Signed)
[name], Wound Care Nurse has called back in and want to advise Sharyn Lull tha she is sending orders over now. Her # if need to contact is  [phone]. Call with any questions

## 2019-11-21 NOTE — Telephone Encounter (Signed)
Please advise 

## 2019-11-21 NOTE — Telephone Encounter (Signed)
Provider tried to call to get specifics left message to return call to office - please have wound care nurse evaluate and send to office by fax.

## 2019-11-22 ENCOUNTER — Other Ambulatory Visit: Payer: Self-pay | Admitting: Adult Health

## 2019-11-22 DIAGNOSIS — R350 Frequency of micturition: Secondary | ICD-10-CM

## 2019-11-22 DIAGNOSIS — R251 Tremor, unspecified: Secondary | ICD-10-CM

## 2019-11-22 DIAGNOSIS — I63531 Cerebral infarction due to unspecified occlusion or stenosis of right posterior cerebral artery: Secondary | ICD-10-CM

## 2019-11-22 DIAGNOSIS — Z9181 History of falling: Secondary | ICD-10-CM

## 2019-11-22 DIAGNOSIS — R2689 Other abnormalities of gait and mobility: Secondary | ICD-10-CM

## 2019-11-22 DIAGNOSIS — G20 Parkinson's disease: Secondary | ICD-10-CM

## 2019-11-22 DIAGNOSIS — R531 Weakness: Secondary | ICD-10-CM

## 2019-11-22 DIAGNOSIS — R258 Other abnormal involuntary movements: Secondary | ICD-10-CM

## 2019-11-22 NOTE — Telephone Encounter (Signed)
Received and signed to fax back.

## 2019-11-22 NOTE — Telephone Encounter (Signed)
FYI. KW 

## 2019-11-22 NOTE — Progress Notes (Signed)
Orders Placed This Encounter  Procedures  . Ambulatory referral to Chronic Care Management Services    Referral Priority:   Routine    Referral Type:   Consultation    Referral Reason:   Care Coordination    Number of Visits Requested:   1  . Amb Referral to Palliative Care    Referral Priority:   Routine    Referral Type:   Consultation    Referral Reason:   Goals of Care    Number of Visits Requested:   1    Wound care orders sent back to [facility] home [facility]. Also they are requesting palliative care consult due to polyuria, increased risk for falls, and ongoing decline and weakness with Parkinson's. Order placed will also have them check urine culture.

## 2019-11-25 ENCOUNTER — Telehealth: Payer: Self-pay | Admitting: Adult Health

## 2019-11-25 NOTE — Chronic Care Management (AMB) (Signed)
  Chronic Care Management   Note  11/25/2019 Name: [name] MRN: [id number] DOB: 03/27/1956  [name] is a 64 y.o. year old male who is a primary care patient of Flinchum, Kelby Aline, FNP. [name] is currently enrolled in care management services. An additional referral for Pharm D was placed.   Follow up plan: Unsuccessful telephone outreach attempt made. A HIPPA compliant phone message was left for the patient providing contact information and requesting a return call.  If patient returns call to provider office, please advise to call Embedded Care Management Care Guide Glenna Durand LPN at [phone]  Allesha Aronoff, LPN Health Advisor, [place] Management ??[email or web address] ??[phone]

## 2019-11-26 ENCOUNTER — Ambulatory Visit (INDEPENDENT_AMBULATORY_CARE_PROVIDER_SITE_OTHER): Payer: Medicare Other | Admitting: *Deleted

## 2019-11-26 DIAGNOSIS — F332 Major depressive disorder, recurrent severe without psychotic features: Secondary | ICD-10-CM

## 2019-11-26 DIAGNOSIS — G20 Parkinson's disease: Secondary | ICD-10-CM

## 2019-11-27 ENCOUNTER — Telehealth: Payer: Self-pay | Admitting: Adult Health Nurse Practitioner

## 2019-11-27 NOTE — Telephone Encounter (Signed)
Rec'd call back from wife, [name] and she was in agreement with starting Palliative services.  I have scheduled an In-person Consult for 12/05/19 @ 9 AM.

## 2019-11-27 NOTE — Chronic Care Management (AMB) (Signed)
Chronic Care Management    Clinical Social Work General Note  11/27/2019 Name: [name] MRN: [id number] DOB: 10-25-1955  [name] is a 64 y.o. year old male who is a primary care patient of Flinchum, Kelby Aline, FNP. The CCM was consulted to assist the patient with [employer] .   Mr. Schillaci' s spouse(live separately) was given information about Chronic Care Management services today including:  1. CCM service includes personalized support from designated clinical staff supervised by his physician, including individualized plan of care and coordination with other care providers 2. 24/7 contact phone numbers for assistance for urgent and routine care needs. 3. Service will only be billed when office clinical staff spend 20 minutes or more in a month to coordinate care. 4. Only one practitioner may furnish and bill the service in a calendar month. 5. The patient may stop CCM services at any time (effective at the end of the month) by phone call to the office staff. 6. The patient will be responsible for cost sharing (co-pay) of up to 20% of the service fee (after annual deductible is met).  Patient's spouse agreed to services and verbal consent obtained.   Review of patient status, including review of consultants reports, relevant laboratory and other test results, and collaboration with appropriate care team members and the patient's provider was performed as part of comprehensive patient evaluation and provision of chronic care management services.    SDOH (Social Determinants of Health) assessments and interventions performed:  Yes    Outpatient Encounter Medications as of 11/26/2019  Medication Sig  . carbidopa-levodopa (SINEMET IR) 25-100 MG tablet 0.5 tab three times a day for one week, then 1 tab three times a day.  . Cholecalciferol (VITAMIN D3) 1.25 MG (50000 UT) CAPS Take 50,000 Units by mouth every Monday.   [name] PRESCRIPTION MEDICATION Place 1 patch onto the skin  daily as needed (wound care). Silvercel patches   No facility-administered encounter medications on file as of 11/26/2019.    Goals Addressed            This Visit's Progress   . Per patient's spouse "I feel that my spouse needs more help" (pt-stated)       CARE PLAN ENTRY (see longitudinal plan of care for additional care plan information)  Current Barriers:  . Lacks knowledge of community resource: for in home care  Clinical Social Work Clinical Goal(s):  [name] Over the next 90 days, patient will work with SW to address concerns related to in need for in home care for patient  Interventions: . Inter-disciplinary care team collaboration (see longitudinal plan of care) . Patient's spouse (live separately) interviewed and appropriate assessments performed . Confirmed that patient is currently receiving [place]  services, however per patient's spouse needs more assistance in the home once [facility] ends . Per patient's spouse, she would like to keep patient  in his own home as long as possible . Patient's spouse, understands that in home care will be an out of pocket expense . Discussed plans with patient for ongoing care management follow up and provided patient with direct contact information for care management team  Patient Self Care Activities:  . Attends all scheduled provider appointments . Knowledge deficit related to private duty aid care  Initial goal documentation         Follow Up Plan: SW will follow up with patient by phone over the next 7-10 days        [place],  LCSW Clinical Social Worker  [facility] Care Management [phone]

## 2019-11-27 NOTE — Telephone Encounter (Signed)
Called patient's listed home number to schedule the Palliative Consult, no answer - left message with reason for call along with my contact information.  I also called patient's wife [name], no answer - left message with my name and contact number.

## 2019-11-27 NOTE — Patient Instructions (Addendum)
Thank you allowing the Chronic Care Management Team to be a part of your care! It was a pleasure speaking with you today!   [name]' s spouse(live separately) was given information about Chronic Care Management services today including:  1. CCM service includes personalized support from designated clinical staff supervised by his physician, including individualized plan of care and coordination with other care providers 2. 24/7 contact phone numbers for assistance for urgent and routine care needs. 3. Service will only be billed when office clinical staff spend 20 minutes or more in a month to coordinate care. 4. Only one practitioner may furnish and bill the service in a calendar month. 5. The patient may stop CCM services at any time (effective at the end of the month) by phone call to the office staff. 6. The patient will be responsible for cost sharing (co-pay) of up to 20% of the service fee (after annual deductible is met).   CCM (Chronic Care Management) Team   Neldon Labella RN, BSN Nurse Care Coordinator  [phone]   [place], LCSW Clinical Social Worker [phone]  Goals Addressed            This Visit's Progress   . Per patient's spouse "I feel that my spouse needs more help" (pt-stated)       CARE PLAN ENTRY (see longitudinal plan of care for additional care plan information)  Current Barriers:  . Lacks knowledge of community resource: for in home care  Clinical Social Work Clinical Goal(s):  [name] Over the next 90 days, patient will work with SW to address concerns related to in need for in home care for patient  Interventions: . Inter-disciplinary care team collaboration (see longitudinal plan of care) . Patient's spouse (live separately) interviewed and appropriate assessments performed . Confirmed that patient is currently receiving [facility]  services, however per patient's spouse needs more assistance in the home once [facility] ends . Per patient's spouse,  she would like to keep patient  in his own home as long as possible . Patient's spouse, understands that in home care will be an out of pocket expense . Discussed plans with patient for ongoing care management follow up and provided patient with direct contact information for care management team  Patient Self Care Activities:  . Attends all scheduled provider appointments . Knowledge deficit related to private duty aid care  Initial goal documentation         The patient verbalized understanding of instructions provided today and declined a print copy of patient instruction materials.   Telephone follow up appointment with care management team member scheduled for: 12/04/19

## 2019-12-02 NOTE — Chronic Care Management (AMB) (Signed)
  Chronic Care Management   Outreach Note  12/02/2019 Name: [name] MRN: [id number] DOB: [birth year]  [name] is a 64 y.o. year old male who is a primary care patient of [name], Arthur Aline, Stephens. I reached out to [employer] by phone today in response to a referral sent by [name]'s PCP, [name]     A second unsuccessful telephone outreach was attempted today. The patient was referred to the case management team for assistance with care management and care coordination.   Follow Up Plan: A HIPPA compliant phone message was left for the patient providing contact information and requesting a return call.  The care management team will reach out to the patient again over the next 7 days.  If patient returns call to provider office, please advise to call Embedded Care Management Care Guide [name] at [phone]  Arthur Happel, Stephens Health Advisor, [place] Management ??[email or web address] ??[phone]

## 2019-12-03 ENCOUNTER — Ambulatory Visit: Payer: Self-pay

## 2019-12-03 DIAGNOSIS — F332 Major depressive disorder, recurrent severe without psychotic features: Secondary | ICD-10-CM | POA: Diagnosis not present

## 2019-12-03 DIAGNOSIS — I63531 Cerebral infarction due to unspecified occlusion or stenosis of right posterior cerebral artery: Secondary | ICD-10-CM

## 2019-12-03 DIAGNOSIS — G20 Parkinson's disease: Secondary | ICD-10-CM

## 2019-12-03 DIAGNOSIS — Z9181 History of falling: Secondary | ICD-10-CM

## 2019-12-04 ENCOUNTER — Telehealth: Payer: Self-pay | Admitting: *Deleted

## 2019-12-05 ENCOUNTER — Telehealth: Payer: Self-pay

## 2019-12-05 ENCOUNTER — Other Ambulatory Visit: Payer: Self-pay

## 2019-12-05 ENCOUNTER — Other Ambulatory Visit: Payer: Medicare Other | Admitting: Adult Health Nurse Practitioner

## 2019-12-05 DIAGNOSIS — G20 Parkinson's disease: Secondary | ICD-10-CM

## 2019-12-05 DIAGNOSIS — Z515 Encounter for palliative care: Secondary | ICD-10-CM

## 2019-12-05 NOTE — Progress Notes (Signed)
[facility] Consult Note Telephone: [phone]  Fax: [phone]  PATIENT NAME: [name] DOB: [birth year] MRN: [id number]  PRIMARY CARE PROVIDER:   Doreen Beam, FNP  REFERRING PROVIDER:  Doreen Stephens, [place],  [place] [address]  RESPONSIBLE PARTYVelva Stephens, wife [phone]    RECOMMENDATIONS and PLAN:  1.  Advanced care planning.  Patient is full code.  Started discussion on MOST form.  Discussed that he would like his grandson [name].  Encouraged to go over MOST form and medical wishes as a family and will discuss further at future visit.  2.  Parkinson's.Patient was initially seen by Dr. Manuella Stephens with neurology on 11/06/2019 after presenting to ER on 09/30/2019 for weakness with no significant findings.  Patient does have history of CVA a year ago.  Also has history of depression with treatment with ECT and history of catatonia.  Patient is having episodes of freezing, bradykinesia, hypophonia, resting tremor.  Patient was started on Sinemet 25/100 mg 3 times daily.  Patient has poor compliance with taking the Sinemet as he feels that every time he takes that he has to have a soft bowel movement.  Have encouraged him to consistently take the Sinemet as sometimes these mild side effects will go away after more consistent use.  Patient is able to walk with a walker.  He does have difficulty getting up from seated position.  He requires assistance with ADLs.  When having to go to the bathroom more frequently due to the sentiment he is unable to make it to the bathroom in time.  We will reach out to Dr. Trena Stephens office with this concern  3.  Pressure injury.  Patient has pressure injury to buttocks.  Wife states that he has been followed by wound care and silver cell patches have been used.  States that while the silver cell patches were used they were noticed some  improvement.  RN through [facility] has been managing the wound care.  Wife states that the type of bandage being used has been changed and she does not feel like the wound is improving anymore.  States that she has called the wound clinic and they want to see him before resuming the silver cell patches.  Have encouraged setting up appointment and keeping appointment for evaluation at wound clinic for further treatment recommendations.  4.  Support.  Patient's wife tries to assist when she can and his grandson checks on him every evening and will come more frequently when needed.  Discussed having in-home care to at least help during the periods of the day where he can have someone make sure that he eats his meals takes his medications and gets him to the bathroom.  Grandson did state that he has someone that he has been talking with that may be able to come and help that would be cheaper than an agency.  Patient does not qualify for Medicaid at this time.  We will continue to assess for home care needs and will get social work involved if needed.  Patient has been working on getting a hospital bed in the home.  Wife states that she has been talking with the provider about getting this.  Wife states that they have also looked into a lift chair and that insurance will only pay for the motorized part of the chair so they were looking  into getting a lift chair to help him get in and out of the chair easier.  Palliative care will continue to monitor for symptom management/decline and make recommendations as needed.  Next appointment is in 4 weeks.  I spent 80 minutes providing this consultation,  from 9:00 to 10:20 including time spent with patient/family, chart review, provider coordination, documentation. More than 50% of the time in this consultation was spent coordinating communication.   HISTORY OF PRESENT ILLNESS:  [name] is a 64 y.o. year old male with multiple medical problems  including Parkinson's disease, depression,vitamin D deficiency. Palliative Care was asked to help address goals of care.   CODE STATUS: full code  PPS: 40% HOSPICE ELIGIBILITY/DIAGNOSIS: TBD  PHYSICAL EXAM:  BP 152/88  HR 63  O2 99% 0n RA General: NAD, frail appearing, thin Cardiovascular: regular rate and rhythm Pulmonary: lung sounds clear; normal respiratory effort Abdomen: soft, nontender, + bowel sounds GU: no suprapubic tenderness Extremities: trace edema to feet and ankles, no joint deformities Skin: no rashes on exposed skin Neurological: Weakness; tremors noted to right arm today   PAST MEDICAL HISTORY:  Past Medical History:  Diagnosis Date  . Anxiety   . Cataract   . Depression   . Hyperlipidemia 01/10/2019  . Idiopathic peripheral neuropathy 12/25/2018  . Prostate disease   . Stroke [facility])     SOCIAL HX:  Social History   Tobacco Use  . Smoking status: Former Research scientist (life sciences)  . Smokeless tobacco: Former Systems developer    Quit date: 01/10/2017  Substance Use Topics  . Alcohol use: Not Currently    ALLERGIES: No Known Allergies   PERTINENT MEDICATIONS:  Outpatient Encounter Medications as of 12/05/2019  Medication Sig  . carbidopa-levodopa (SINEMET IR) 25-100 MG tablet 0.5 tab three times a day for one week, then 1 tab three times a day.  . Cholecalciferol (VITAMIN D3) 1.25 MG (50000 UT) CAPS Take 50,000 Units by mouth every Monday.   [name] PRESCRIPTION MEDICATION Place 1 patch onto the skin daily as needed (wound care). Silvercel patches   No facility-administered encounter medications on file as of 12/05/2019.     Arthur Batdorf Jenetta Downer, NP

## 2019-12-06 ENCOUNTER — Ambulatory Visit: Payer: Self-pay | Admitting: *Deleted

## 2019-12-06 DIAGNOSIS — G20 Parkinson's disease: Secondary | ICD-10-CM

## 2019-12-06 DIAGNOSIS — F332 Major depressive disorder, recurrent severe without psychotic features: Secondary | ICD-10-CM

## 2019-12-06 NOTE — Patient Instructions (Signed)
Thank you allowing the Chronic Care Management Team to be a part of your care! It was a pleasure speaking with you today!  1. Please contact this social worker if additional in home aid resources are needed in the future  CCM (Chronic Care Management) Team   Neldon Labella RN, BSN Nurse Care Coordinator  [phone]   [place], LCSW Clinical Social Worker [phone]  Goals Addressed            This Visit's Progress   . Per patient's spouse "I feel that my spouse needs more help" (pt-stated)       CARE PLAN ENTRY (see longitudinal plan of care for additional care plan information)  Current Barriers:  . Lacks knowledge of community resource: for in home care  Clinical Social Work Clinical Goal(s):  [name] Over the next 90 days, patient will work with SW to address concerns related to in need for in home care for patient  Interventions: . Patient's spouse confirmed that patient continues to have [facility]  services, however per patient's spouse needs more assistance in the home once [facility] ends . Per patient's spouse, she would like to keep patient  in his own home as long as possible . Patient's spouse, understands that in home care will be an out of pocket expense and has a private duty aid, that has experience with working with Hospice patients,in mind that they will choose to assist patient daily . Patient's spouse will plan to call this social worker if additional private duty aid resources are needed in the future. . Discussed plans with patient for ongoing care management follow up and provided patient with direct contact information for care management team  Patient Self Care Activities:  . Attends all scheduled provider appointments . Knowledge deficit related to private duty aid care  Please see past updates related to this goal by clicking on the "Past Updates" button in the selected goal          The patient verbalized understanding of instructions  provided today and declined a print copy of patient instruction materials.   No further follow up required: patient's spouse to call this social worker with [employer] needs in te future

## 2019-12-06 NOTE — Chronic Care Management (AMB) (Signed)
  Chronic Care Management    Clinical Social Work Follow Up Note  12/06/2019 Name: [name] MRN: [id number] DOB: Dec 02, 1955  [name] is a 64 y.o. year old male who is a primary care patient of Flinchum, Kelby Aline, FNP. The CCM team was consulted for assistance with [employer] .   Review of patient status, including review of consultants reports, other relevant assessments, and collaboration with appropriate care team members and the patient's provider was performed as part of comprehensive patient evaluation and provision of chronic care management services.    SDOH (Social Determinants of Health) assessments performed: No    Outpatient Encounter Medications as of 12/06/2019  Medication Sig  . carbidopa-levodopa (SINEMET IR) 25-100 MG tablet 0.5 tab three times a day for one week, then 1 tab three times a day.  . Cholecalciferol (VITAMIN D3) 1.25 MG (50000 UT) CAPS Take 50,000 Units by mouth every Monday.   [name] PRESCRIPTION MEDICATION Place 1 patch onto the skin daily as needed (wound care). Silvercel patches   No facility-administered encounter medications on file as of 12/06/2019.     Goals Addressed            This Visit's Progress   . Per patient's spouse "I feel that my spouse needs more help" (pt-stated)       CARE PLAN ENTRY (see longitudinal plan of care for additional care plan information)  Current Barriers:  . Lacks knowledge of community resource: for in home care  Clinical Social Work Clinical Goal(s):  [name] Over the next 90 days, patient will work with SW to address concerns related to in need for in home care for patient  Interventions: . Patient's spouse confirmed that patient continues to have [facility]  services, however per patient's spouse needs more assistance in the home once [facility] ends . Per patient's spouse, she would like to keep patient  in his own home as long as possible . Patient's spouse, understands that in home care will be an out  of pocket expense and has a private duty aid, that has experience with working with Hospice patients,in mind that they will choose to assist patient daily . Patient's spouse will plan to call this social worker if additional private duty aid resources are needed in the future. . Discussed plans with patient for ongoing care management follow up and provided patient with direct contact information for care management team  Patient Self Care Activities:  . Attends all scheduled provider appointments . Knowledge deficit related to private duty aid care  Please see past updates related to this goal by clicking on the "Past Updates" button in the selected goal          Follow Up Plan: Client's spouse will contact this social worker if additional in home aid resources are needed in the future    Ajee Heasley, [place] Worker  [facility] Care Management [phone]

## 2019-12-10 NOTE — Chronic Care Management (AMB) (Signed)
  Chronic Care Management   Note  12/10/2019 Name: [name] MRN: [id number] DOB: 1955/08/17  [name] is a 64 y.o. year old male who is a primary care patient of Flinchum, Kelby Aline, FNP. [name] is currently enrolled in care management services. An additional referral for Pharm D was placed.   Follow up plan: 3rd unsuccessful telephone outreach attempt made.  The care management team is available to follow up with the patient after provider conversation with the patient regarding recommendation for care management engagement and subsequent re-referral to the care management team.   Glenna Durand, LPN Health Advisor, [facility] ??[email or web address] ??[phone]

## 2019-12-17 NOTE — Patient Instructions (Addendum)
Thank you for allowing the Chronic Care Management team to participate in your care.  Goals Addressed            This Visit's Progress   . Home Safety and Fall Prevention       CARE PLAN ENTRY (see longitudinal plan of care for additional care plan information)  Current Barriers:  . High Fall Risk related to  Parkinson's Disease, Idiopathic Peripheral Neuropathy and Hemorrhagic Infarction (Right Side)   Clinical Goal(s):  [name] Over the next 120 days, patient will not require hospitalization or emergent care for fall related injuries. . Over the next 90 days, patient will take medications as prescribed. . Over the next 90 days, patient will attend all scheduled medical appointments. . Over the next 90 days, patient will use assistive devices when ambulating. . Over the next 90 days, caregiver will work with care management team and provide updates with care needs and changes in patient's health status.   Interventions: . Reviewed medications with caregiver. Discussed indications for use and potential side effects. Encouraged to assist patient with taking medications as prescribed. Encouraged to notify care management team with concerns regarding medication management, adherence or prescription costs. . Provided education regarding home safety and fall prevention. Encouraged to de-clutter pathways/walkways and ensure home is well lit. Encouraged to ensure patient uses assistive device when ambulating. Reports he is currently using a walker. Will follow-up regarding need for wheelchair and life alert device. . Reviewed scheduled/pending appointments. Encouraged to ensure patient attends appointments as scheduled to prevent delays in care. Encouraged to notify team with concerns regarding transportation. . Discussed plan for ongoing care management and follow-up. Provided direct contact information for care management team. Currently working with CCM LCSW regarding community resources. Agreed to  keep team updated if additional assistance is needed in the home.    Patient Self Care Activities:  . Unable to perform ADLs independently . Unable to perform IADLs independently     Initial goal documentation         Mr. Neary's caregiver [name] was given information about Chronic Care Management services including:  1. CCM service includes personalized support from designated clinical staff supervised by his physician, including individualized plan of care and coordination with other care providers 2. 24/7 contact phone numbers for assistance for urgent and routine care needs. 3. Service will only be billed when office clinical staff spend 20 minutes or more in a month to coordinate care. 4. Only one practitioner may furnish and bill the service in a calendar month. 5. The patient may stop CCM services at any time (effective at the end of the month) by phone call to the office staff. 6. The patient will be responsible for cost sharing (co-pay) of up to 20% of the service fee (after annual deductible is met).  Mr. Tess's caregiver [name] agreed to services. Verbal consent obtained.    [name] verbalized understanding of the information discussed during the telephonic outreach today.  Declined need for mailed/printed instructions.   The care management team will follow-up next month.   [facility] Care Management [phone]

## 2019-12-17 NOTE — Chronic Care Management (AMB) (Addendum)
Chronic Care Management      Name: RODERIC LAMMERT MRN: [id number] DOB: 29-Mar-1956  Primary Care Provider: Doreen Beam, FNP Reason for referral : Chronic Care Management   Cerrone Debold Ponce is a 64 y.o. year old male who is a primary care patient of Flinchum, Kelby Aline, FNP. The CCM team was consulted for assistance with chronic disease management and care coordination needs. A telephonic assessment was conducted today with his caregiver, [name].  Review of Mr. Aranas's status, including review of consultants reports, relevant labs and test results was conducted today. Collaboration with appropriate care team members was performed as part of the comprehensive evaluation and provision of chronic care management services.    Medications: Outpatient Encounter Medications as of 12/03/2019  Medication Sig  . carbidopa-levodopa (SINEMET IR) 25-100 MG tablet 0.5 tab three times a day for one week, then 1 tab three times a day.  . Cholecalciferol (VITAMIN D3) 1.25 MG (50000 UT) CAPS Take 50,000 Units by mouth every Monday.   [name] PRESCRIPTION MEDICATION Place 1 patch onto the skin daily as needed (wound care). Silvercel patches   No facility-administered encounter medications on file as of 12/03/2019.     Objective:   Functional Status Survey: Is the patient deaf or have difficulty hearing?: No Does the patient have difficulty seeing, even when wearing glasses/contacts?: Yes Does the patient have difficulty concentrating, remembering, or making decisions?: No Does the patient have difficulty walking or climbing stairs?: Yes Does the patient have difficulty dressing or bathing?: Yes Does the patient have difficulty doing errands alone such as visiting a doctor's office or shopping?: Yes    Fall Risk  12/03/2019 10/15/2019 06/21/2019 04/04/2018 03/26/2018  Falls in the past year? _0 Yes -  Number falls in past yr: 1 0 1 2 or more 2 or more  Injury with Fall? 0 0 1 No No  Risk Factor  Category  - - - High Fall Risk High Fall Risk  Risk for fall due to : Impaired balance/gait;History of fall(s) Impaired balance/gait History of fall(s);Impaired balance/gait;Impaired vision - -  Follow up Falls prevention discussed Falls evaluation completed Falls evaluation completed - -  Comment Discussed with caregiver [name]. - - - -     Goals Addressed            This Visit's Progress   . Home Safety and Fall Prevention       CARE PLAN ENTRY (see longitudinal plan of care for additional care plan information)  Current Barriers:  . High Fall Risk related to  Parkinson's Disease, Idiopathic Peripheral Neuropathy and Hemorrhagic Infarction (Right Side)   Clinical Goal(s):  [name] Over the next 120 days, patient will not require hospitalization or emergent care for fall related injuries. . Over the next 90 days, patient will take medications as prescribed. . Over the next 90 days, patient will attend all scheduled medical appointments. . Over the next 90 days, patient will use assistive devices when ambulating. . Over the next 90 days, caregiver will work with care management team and provide updates with care needs and changes in patient's health status.   Interventions: . Reviewed medications with caregiver. Discussed indications for use and potential side effects. Encouraged to assist patient with taking medications as prescribed. Encouraged to notify care management team with concerns regarding medication management, adherence or prescription costs. . Provided education regarding home safety and fall prevention. Encouraged to de-clutter pathways/walkways and ensure home is well lit. Encouraged to ensure  patient uses assistive device when ambulating. Reports he is currently using a walker. Will follow-up regarding need for wheelchair and life alert device. . Reviewed scheduled/pending appointments. Encouraged to ensure patient attends appointments as scheduled to prevent delays in care.  Encouraged to notify team with concerns regarding transportation. . Discussed plan for ongoing care management and follow-up. Provided direct contact information for care management team. Currently working with CCM LCSW regarding community resources. Agreed to keep team updated if additional assistance is needed in the home.    Patient Self Care Activities:  . Unable to perform ADLs independently . Unable to perform IADLs independently     Initial goal documentation         Mr. Genova's caregiver Rita was given information about Chronic Care Management services including:  1. CCM service includes personalized support from designated clinical staff supervised by his physician, including individualized plan of care and coordination with other care providers 2. 24/7 contact phone numbers for assistance for urgent and routine care needs. 3. Service will only be billed when office clinical staff spend 20 minutes or more in a month to coordinate care. 4. Only one practitioner may furnish and bill the service in a calendar month. 5. The patient may stop CCM services at any time (effective at the end of the month) by phone call to the office staff. 6. The patient will be responsible for cost sharing (co-pay) of up to 20% of the service fee (after annual deductible is met).  Mr. Haye's caregiver Rita agreed to services. Verbal consent obtained.     PLAN The care management team will follow-up next month.     ,RN [facility] Care [facility] [phone]     

## 2019-12-18 NOTE — Chronic Care Management (AMB) (Signed)
  Chronic Care Management   Note   Name: [name] MRN: [id number] DOB: 01/16/1956    Message received from Mr. Schaum's caregiver/spouse [name]. Initial telephonic assessment will be rescheduled.      Horris Latino [facility] [phone]

## 2019-12-19 ENCOUNTER — Other Ambulatory Visit: Payer: Self-pay | Admitting: Adult Health

## 2019-12-25 ENCOUNTER — Telehealth: Payer: Self-pay

## 2019-12-25 NOTE — Telephone Encounter (Signed)
Copied from [place] [phone]. Topic: General - Other >> Dec 25, 2019  3:21 PM Oneta Rack wrote: Osvaldo Human name: Sharyn Lull from [facility]  Relation to pt: OT  Call back [phone]   Reason for call:  Requesting OT 1x 1 2x 3

## 2019-12-25 NOTE — Telephone Encounter (Signed)
Copied from [place] [id number]. Topic: General - Other >> Dec 25, 2019  3:21 PM Oneta Rack wrote: Osvaldo Human name: Sharyn Lull from [facility]  Relation to pt: OT  Call back number:[phone]   Reason for call:  Requesting OT 1x 1 2x 3

## 2019-12-26 NOTE — Telephone Encounter (Signed)
Tried Associate Professor with [employer] at number listed below. The voice message system said that I had reached the voice mail of Occupational therapist named "Colletta Maryland". I left a message for her to call back. OK for [facility] triage to advise as below when they return the call.

## 2019-12-26 NOTE — Telephone Encounter (Signed)
Okay to give verbal order for OT as requested. Contact office if changes at anytime.

## 2019-12-30 ENCOUNTER — Telehealth: Payer: Self-pay | Admitting: Adult Health

## 2019-12-30 NOTE — Telephone Encounter (Signed)
Wife called to request that the doctor sign off on orders for the CNA to come in and bath patient.  Stated that the NT was renewed but the CNA did not get approval for bathing.  Please advise and call to let the wife know when this could be approved.  CB# [phone]

## 2019-12-30 NOTE — Telephone Encounter (Signed)
Will need to revise order for home health referral. Ok to do or place new one if needed. Use same diagnosis code as Sharyn Lull used for the other order

## 2019-12-31 ENCOUNTER — Other Ambulatory Visit: Payer: Self-pay

## 2019-12-31 ENCOUNTER — Other Ambulatory Visit: Payer: Medicare Other | Admitting: Adult Health Nurse Practitioner

## 2020-01-01 ENCOUNTER — Telehealth: Payer: Self-pay | Admitting: Adult Health Nurse Practitioner

## 2020-01-01 NOTE — Telephone Encounter (Signed)
This is late entry.  Showed up for appointment at 11am on 12/31/19.  Family not with patient and he did not answer the door.  Spoke with wife who states that he also refused PT this morning due to having an incontinent episode.  Lost phone connectivity and had office call to reschedule.  Appointment rescheduled for 01/13/20 at 12:30. Mackay Hanauer K. Olena Heckle NP

## 2020-01-06 NOTE — Telephone Encounter (Signed)
Please review. KW 

## 2020-01-07 ENCOUNTER — Telehealth: Payer: Self-pay

## 2020-01-07 NOTE — Telephone Encounter (Signed)
Just following up to see if Occupational verbal orders were given or if OT was reached.

## 2020-01-07 NOTE — Telephone Encounter (Signed)
Spoke with wife she states that OT is coming out to home today but she did not know if company had received orders request aide to come bathe patient in home. Im unsure who I should follow back up with to find out this information. KW

## 2020-01-07 NOTE — Telephone Encounter (Signed)
I signed two sets of orders from home health before I left. Do they need a hard script or can they fax back over orders if they did not receive. This may have already been addressed while I was out of the office  ?

## 2020-01-07 NOTE — Telephone Encounter (Signed)
Copied from [place] [id number]. Topic: General - Other >> Jan 07, 2020  8:56 AM Keene Breath wrote: Reason for CRM: Patient's wife called and said she is returning a call to the office.  She is not exactly sure why husband got the call.  Please call back at [phone]

## 2020-01-07 NOTE — Telephone Encounter (Signed)
Noted. Thanks.

## 2020-01-07 NOTE — Telephone Encounter (Signed)
See previous telephone encounter. KW 

## 2020-01-07 NOTE — Telephone Encounter (Signed)
So I just spoke with a Colletta Maryland from [facility] and she states that [name] is not around her at the moment and state that they had not received verbal orders for patient. Gave stephanie order for O. KW

## 2020-01-07 NOTE — Telephone Encounter (Signed)
Attempted to contact patients wife on home number/mobile number listed below, voicemail box has not been set up yet. Im unsure if new orders need to be placed.KW

## 2020-01-08 NOTE — Telephone Encounter (Signed)
He is with [facility]/ hospice [phone] phone and fax is [phone]. Let me know if I need to write script for for further evaluation for care needs or if they will take verbal order he should have been evaluated by PT unless they discharged him and also care giver for bath.

## 2020-01-13 ENCOUNTER — Other Ambulatory Visit: Payer: Medicare Other | Admitting: Adult Health Nurse Practitioner

## 2020-01-13 ENCOUNTER — Other Ambulatory Visit: Payer: Self-pay

## 2020-01-13 DIAGNOSIS — Z515 Encounter for palliative care: Secondary | ICD-10-CM

## 2020-01-13 DIAGNOSIS — G20 Parkinson's disease: Secondary | ICD-10-CM

## 2020-01-13 NOTE — Progress Notes (Signed)
[facility] Consult Note Telephone: [phone]  Fax: [phone]  PATIENT NAME: [name] DOB: May 07, 1956 MRN: [id number]  PRIMARY CARE PROVIDER:   Doreen Beam, FNP  REFERRING PROVIDER:  Doreen Stephens, [name],  [name]  RESPONSIBLE PARTYVelva Stephens, wife [phone]    RECOMMENDATIONS and PLAN:  1.  Advanced care planning.  Patient is full code.    Wife and grandson was not with patient today.  We will continue to have further discussions on ACP.  Will call wife to update on visit today  2.  Parkinson's.  Patient is being followed by Dr. Manuella Stephens with neurology.  Patient unsure if he is getting all of his medication as prescribed. He does state that tremors come and go.  No tremors noted today and patient does not complain of freezing episodes.  Patient currently on Sinemet 25/100 mg 3 times daily.  Patient is able to walk with a walker.  He does have difficulty getting up from a seated position.  He now has a lift chair in the home which helps with this.  He requires assistance with ADLs.  Has functional incontinence.  Denies headaches, dizziness, pain, chest pain, increased SOB or cough, N/V/D, constipation.  States appetite is good with no reported weight loss.  Denies falls.  Family working with PCP for hospital bed. Continue to work with PT/OT through home health. Continue follow up and recommendations by Dr. Manuella Stephens and PT/OT as ordered.  3.  Pressure injury.  Continues to have pressure injury to buttocks.  States that it is improving slowly.  Being followed by home health RN.  Has not had any more appointments with wound clinic.  Continue follow up and recommendations by wound RN.  May consider air mattress for hospital bed depending on improvement.  4.  Support.  Patient has frequent visits throughout the day by wife, grandson, and a friend.  This does seem to be helping him get his  meds and meals more consistently.  Will continue to monitor for need for more in home help or other resources.  Palliative care will continue to monitor for symptom management/decline and make recommendations as needed.  Will set up next follow up appointment when calling wife to update on visit.  I spent 50 minutes providing this consultation,  from 12:45 to 1:35 time spent with patient/family, chart review, provider coordination, documentation. More than 50% of the time in this consultation was spent coordinating communication.   HISTORY OF PRESENT ILLNESS:  [name] is a 64 y.o. year old male with multiple medical problems including Parkinson's disease, depression,vitamin D deficiency. Palliative Care was asked to help address goals of care.   CODE STATUS: full code  PPS: 40% HOSPICE ELIGIBILITY/DIAGNOSIS: TBD  PHYSICAL EXAM:  BP 112/68  HR 71  O2 99% on RA General: NAD, frail appearing, thin Cardiovascular: regular rate and rhythm Pulmonary: Lung sounds clear; normal respiratory effort Abdomen: soft, nontender, + bowel sounds GU: no suprapubic tenderness Extremities: Trace edema to feet and ankles, no joint deformities Skin: no rashes on exposed skin Neurological: Weakness; no tremors noted today    PAST MEDICAL HISTORY:  Past Medical History:  Diagnosis Date   Anxiety    Cataract    Depression    Hyperlipidemia 01/10/2019   Idiopathic peripheral neuropathy 12/25/2018   Prostate disease    Stroke [facility])     SOCIAL HX:  Social History  Tobacco Use   Smoking status: Former Smoker   Smokeless tobacco: Former Systems developer    Quit date: 01/10/2017  Substance Use Topics   Alcohol use: Not Currently    ALLERGIES: No Known Allergies   PERTINENT MEDICATIONS:  Outpatient Encounter Medications as of 01/13/2020  Medication Sig   carbidopa-levodopa (SINEMET IR) 25-100 MG tablet Take 0.5 tab three times a day for one week, then 1 tab three times a day.   Cholecalciferol  (VITAMIN D3) 1.25 MG (50000 UT) CAPS Take 50,000 Units by mouth every Monday.    PRESCRIPTION MEDICATION Place 1 patch onto the skin daily as needed (wound care). Silvercel patches   No facility-administered encounter medications on file as of 01/13/2020.    Arthur Banker Jenetta Downer, NP

## 2020-01-14 ENCOUNTER — Telehealth: Payer: Self-pay | Admitting: Adult Health Nurse Practitioner

## 2020-01-14 ENCOUNTER — Ambulatory Visit: Payer: Self-pay

## 2020-01-14 NOTE — Telephone Encounter (Signed)
Thanks Amy, let me know if anything I can do to help. I believe PT discharged due to failure to progress.   Thank you,  Laverna Peace MSN, AGNP-C, FNP-C  Family Nurse Practitioner  Adult Geriatric Nurse Practitioner

## 2020-01-14 NOTE — Telephone Encounter (Signed)
Per TE today:  Berneta Sages, NP     01/14/20 11:32 AM Note Spoke with wife to update on visit yesterday.  She has concerns that PT stopped but he is still receiving OT through [facility].  Will have RN navigator look into this. Amy K. Olena Heckle NP

## 2020-01-14 NOTE — Chronic Care Management (AMB) (Signed)
°  Chronic Care Management   Outreach Note  01/14/2020 Name: [name] MRN: [id number] DOB: [birth year]  Primary Care Provider: Doreen Beam, FNP Reason for referral : Chronic Care Management   An unsuccessful telephone outreach was attempted today. [name] is currently engaged with the chronic care management team.   I was unable to leave a voice message with [name]'s caregiver [name]. A HIPAA compliant voice message was left with [name] today requesting a return call.     Follow Up Plan: The care management team will reach out again within the next few weeks.   [place] [facility] [phone]

## 2020-01-14 NOTE — Telephone Encounter (Signed)
Fyi. kw 

## 2020-01-14 NOTE — Telephone Encounter (Signed)
Left message on triage nurse line to have nurse call back to clarify if a verbal order or written order needs to be given for aide to come out to home and bathe patient, okay for [facility] triage to review over message below with health nurse when she returns office call back. KW

## 2020-01-14 NOTE — Telephone Encounter (Signed)
, °

## 2020-01-14 NOTE — Telephone Encounter (Signed)
Spoke with wife to update on visit yesterday.  She has concerns that PT stopped but he is still receiving OT through [facility].  Will have RN navigator look into this. Willodene Stallings K. Olena Heckle NP

## 2020-01-15 ENCOUNTER — Telehealth: Payer: Self-pay | Admitting: Adult Health

## 2020-01-15 NOTE — Telephone Encounter (Signed)
Ok to give verbal order as below.

## 2020-01-15 NOTE — Telephone Encounter (Signed)
Home Health Verbal Orders - Caller/Agency: Stephanie// [facility] Callback Number: [phone] [id number]  Requesting OT/PT/Skilled Nursing/Social Work/Speech Therapy: OT Frequency: 2x for 3wks

## 2020-01-16 NOTE — Telephone Encounter (Signed)
Verbal orders have been given. KW 

## 2020-01-28 ENCOUNTER — Ambulatory Visit: Payer: Self-pay

## 2020-01-28 DIAGNOSIS — Z9181 History of falling: Secondary | ICD-10-CM

## 2020-01-28 DIAGNOSIS — G20 Parkinson's disease: Secondary | ICD-10-CM

## 2020-01-28 NOTE — Chronic Care Management (AMB) (Signed)
Chronic Care Management   Follow Up Note   01/28/2020 Name: [name] MRN: [id number] DOB: 1955-11-10  Primary Care Provider: Doreen Beam, FNP Reason for referral : Chronic Care Management  [name] is a 64 y.o. year old male who is a primary care patient of Flinchum, Kelby Aline, FNP. He is currently engaged with the chronic care management team A routine telephonic outreach was conducted today with his spouse/caregiver [name].  Review of [name]'s status, including review of consultants reports, relevant labs and test results was conducted today. Collaboration with appropriate care team members was performed as part of the comprehensive evaluation and provision of chronic care management services.    SDOH (Social Determinants of Health) assessments performed: No   Outpatient Encounter Medications as of 01/28/2020  Medication Sig  . carbidopa-levodopa (SINEMET IR) 25-100 MG tablet Take 0.5 tab three times a day for one week, then 1 tab three times a day.  . Cholecalciferol (VITAMIN D3) 1.25 MG (50000 UT) CAPS Take 50,000 Units by mouth every Monday.   [name] PRESCRIPTION MEDICATION Place 1 patch onto the skin daily as needed (wound care). Silvercel patches   No facility-administered encounter medications on file as of 01/28/2020.      Goals Addressed            This Visit's Progress   . Home Safety and Fall Prevention       CARE PLAN ENTRY (see longitudinal plan of care for additional care plan information)  Current Barriers:  . High Fall Risk related to  Parkinson's Disease, Idiopathic Peripheral Neuropathy and Hemorrhagic Infarction (Right Side)   Clinical Goal(s):  [name] Over the next 120 days, patient will not require hospitalization or emergent care for fall related injuries. . Over the next 90 days, patient will take medications as prescribed. . Over the next 90 days, patient will attend all scheduled medical appointments. . Over the next 90 days, patient  will use assistive devices when ambulating. . Over the next 90 days, caregiver will work with care management team and provide updates with care needs and changes in patient's health status.    Interventions: . Discussed medications, current treatment recommendations and home safety. Discussed plan for ongoing care management and follow-up. Per [name], [name] is receiving home health services with [facility] but has not been evaluated by a Physical Therapist. Per referral review, this service was ordered/approved by his PCP. He is also pending receipt of a hospital bed. Reports the agency manager was made aware of these concerns earlier today. They are hoping to initiate PT services this week. No recent falls. [name] reports taking him to medical appointments as scheduled but expressed concerns that he does not seem motivated to perform simple tasks in the home. Reports he often refuses medications. Must be prompted to take as prescribed and perform simple self-care activities. [name] and her grandson are currently available to provide in-home assistance as needed. He is also receiving assistance from a nursing aide three times a week through [facility]. Will follow up regarding status of PT services. [name] agreed to notify team if he status declines and additional assistance is needed.     Patient Self Care Activities:  . Unable to perform ADLs independently . Unable to perform IADLs independently     Please see past updates related to this goal by clicking on the "Past Updates" button in the selected goal           PLAN The care management  team will follow-up within the next few weeks.   Horris Latino [facility] [phone]

## 2020-01-30 NOTE — Patient Instructions (Signed)
Thank you for allowing the Chronic Care Management team to participate in your care.   Goals Addressed            This Visit's Progress    Home Safety and Fall Prevention       CARE PLAN ENTRY (see longitudinal plan of care for additional care plan information)  Current Barriers:   High Fall Risk related to  Parkinson's Disease, Idiopathic Peripheral Neuropathy and Hemorrhagic Infarction (Right Side)   Clinical Goal(s):   Over the next 120 days, patient will not require hospitalization or emergent care for fall related injuries.  Over the next 90 days, patient will take medications as prescribed.  Over the next 90 days, patient will attend all scheduled medical appointments.  Over the next 90 days, patient will use assistive devices when ambulating.  Over the next 90 days, caregiver will work with care management team and provide updates with care needs and changes in patient's health status.    Interventions:  Discussed medications, current treatment recommendations and home safety. Discussed plan for ongoing care management and follow-up. Per [name], Mr. Obie is receiving home health services with [facility] but has not been evaluated by a Physical Therapist. Per referral review, this service was ordered/approved by his PCP. He is also pending receipt of a hospital bed. Reports the agency manager was made aware of these concerns earlier today. They are hoping to initiate PT services this week. No recent falls. [name] reports taking him to medical appointments as scheduled but expressed concerns that he does not seem motivated to perform simple tasks in the home. Reports he often refuses medications. Must be prompted to take as prescribed and perform simple self-care activities. [name] and her grandson are currently available to provide in-home assistance as needed. He is also receiving assistance from a nursing aide three times a week through [facility]. Will follow up regarding status of  PT services. [name] agreed to notify team if he status declines and additional assistance is needed.     Patient Self Care Activities:   Unable to perform ADLs independently  Unable to perform IADLs independently     Please see past updates related to this goal by clicking on the "Past Updates" button in the selected goal         Mr. Noxon's spouse [name] verbalized understanding of the information discussed during the telephonic outreach today. Declined need for a mailed/printed copy of the instructions.   The care management team will follow-up within the next few weeks.   Horris Latino [facility] [phone]

## 2020-02-18 ENCOUNTER — Ambulatory Visit: Payer: Self-pay

## 2020-02-18 DIAGNOSIS — Z9181 History of falling: Secondary | ICD-10-CM

## 2020-02-18 DIAGNOSIS — G20 Parkinson's disease: Secondary | ICD-10-CM

## 2020-02-18 NOTE — Chronic Care Management (AMB) (Signed)
  Chronic Care Management   Follow Up Note   02/18/2020 Name: KENDAN CORNFORTH MRN: [id number] DOB: 1956-06-01  Primary Care Provider: Doreen Beam, FNP Reason for referral : Chronic Care Management   Patricio Popwell Templer is a 64 y.o. year old male who is a primary care patient of Flinchum, Kelby Aline, FNP. He is currently engaged with the chronic care management team. A routine outreach was conducted today with his caregiver/spouse [name].  Review of Mr. Lorson's status, including review of consultants reports, relevant labs and test results was conducted today. Collaboration with appropriate care team members was performed as part of the comprehensive evaluation and provision of chronic care management services.    SDOH (Social Determinants of Health) assessments performed: No     Outpatient Encounter Medications as of 02/18/2020  Medication Sig  . carbidopa-levodopa (SINEMET IR) 25-100 MG tablet Take 0.5 tab three times a day for one week, then 1 tab three times a day.  . Cholecalciferol (VITAMIN D3) 1.25 MG (50000 UT) CAPS Take 50,000 Units by mouth every Monday.   [name] PRESCRIPTION MEDICATION Place 1 patch onto the skin daily as needed (wound care). Silvercel patches   No facility-administered encounter medications on file as of 02/18/2020.       Goals Addressed            This Visit's Progress   . Home Safety and Fall Prevention       CARE PLAN ENTRY (see longitudinal plan of care for additional care plan information)  Current Barriers:  . High Fall Risk related to  Parkinson's Disease, Idiopathic Peripheral Neuropathy and Hemorrhagic Infarction (Right Side)   Clinical Goal(s):  [name] Over the next 120 days, patient will not require hospitalization or emergent care for fall related injuries. . Over the next 90 days, patient will take medications as prescribed. . Over the next 90 days, patient will attend all scheduled medical appointments. . Over the next 90 days, patient  will use assistive devices when ambulating. . Over the next 90 days, caregiver will work with care management team and provide updates with care needs and changes in patient's health status.    Interventions: . Discussed medications, current treatment recommendations and home safety. Discussed plan for ongoing care management and follow-up. Discussed Mr. Swarey's status with his caregiver/spouse [name]. During our previous outreach she indicated the need for Physical Therapy services. Reports that he is now receiving in-home PT services. The current plan is to complete sessions twice a week. States he is still waiting to receive his hospital bed but the [facility] staff are assisting. Discussed need for adult briefs and option to order this item via his [facility])  benefit. She will attempt to order via [facility] and notify the team if additional assistance is needed. Denies significant changes or decline in Mr. Caillouet's status. No recent falls. She agreed to notify the team assistance is needed prior to the outreach next month.    Patient Self Care Activities:  . Unable to perform ADLs independently . Unable to perform IADLs independently     Please see past updates related to this goal by clicking on the "Past Updates" button in the selected goal         PLAN The care management team will follow-up next month.   [facility] Care [facility] [phone]

## 2020-02-24 NOTE — Patient Instructions (Addendum)
Thank you for allowing the Chronic Care Management team to participate in your care.   Goals Addressed            This Visit's Progress   . Home Safety and Fall Prevention       CARE PLAN ENTRY (see longitudinal plan of care for additional care plan information)  Current Barriers:  . High Fall Risk related to  Parkinson's Disease, Idiopathic Peripheral Neuropathy and Hemorrhagic Infarction (Right Side)   Clinical Goal(s):  [name] Over the next 120 days, patient will not require hospitalization or emergent care for fall related injuries. . Over the next 90 days, patient will take medications as prescribed. . Over the next 90 days, patient will attend all scheduled medical appointments. . Over the next 90 days, patient will use assistive devices when ambulating. . Over the next 90 days, caregiver will work with care management team and provide updates with care needs and changes in patient's health status.    Interventions: . Discussed medications, current treatment recommendations and home safety. Discussed plan for ongoing care management and follow-up. Discussed [name]'s status with his caregiver/spouse [name]. During our previous outreach she indicated the need for Physical Therapy services. Reports that he is now receiving in-home PT services. The current plan is to complete sessions twice a week. States he is still waiting to receive his hospital bed but the [facility] staff are assisting. Discussed need for adult briefs and option to order this item via his [facility])  benefit. She will attempt to order via [facility] and notify the team if additional assistance is needed. Denies significant changes or decline in [name]'s status. No recent falls. She agreed to notify the team assistance is needed prior to the outreach next month.    Patient Self Care Activities:  . Unable to perform ADLs independently . Unable to perform IADLs independently     Please see past updates  related to this goal by clicking on the "Past Updates" button in the selected goal         [name]'s caregiver [name] verbalized understanding of the information discussed during the telephonic outreach today. Declined need for a mailed/printed copy of the plan.   The care management team will follow-up next month.   [facility] Care [facility] [phone]

## 2020-03-05 ENCOUNTER — Ambulatory Visit: Payer: Self-pay

## 2020-03-11 NOTE — Chronic Care Management (AMB) (Signed)
  Chronic Care Management   Note     Name: [name] MRN: [id number] DOB: 1956/04/03    Brief outreach with Mr. Coda spouse [name]. Discussed status of Mr. Micajah previous request for a hospital bed. States the [facility] team confirmed that the bed was requested. She is currently waiting for a reply regarding the status of the order and the anticipated delivery date. She agreed to follow up with the care management team within the next few weeks if the bed is not received and additional assistance is needed.    Follow up plan: A member of the care management team will follow up with [name] within the next few weeks.    [facility] [phone]

## 2020-03-18 ENCOUNTER — Telehealth: Payer: Self-pay

## 2020-03-18 NOTE — Telephone Encounter (Signed)
Advised patient's wife of this and she verbally understands. She agrees with treatment plan, but pt is still declining to go to the ER. She reports that she has already called EMS and pt would not go to the hospital for evaluation.

## 2020-03-18 NOTE — Telephone Encounter (Signed)
Patient's wife Zakir Henner called stating that patient has had a fever for the past 3 days of 101.9.  She denies any cold/ cough/ respiratory symptoms. [name] doesn't know if he has a UTI and it is causing the fever. She says he has urinary incontinence and his urine is dark with an odor. Within the past 3 days patient has had extreme weakness in his legs to where his "legs are not working". [name] says he also has a wound/ bed sore on his buttocks that may be infected.  Patient is refusing to go to the hospital or ER. EMS has been called out to the house twice and patient has refused to go with them to the ER. Patients wife states she is unable to get him in the car to take him to an urgent care because he is unable to use his legs. She has had one of her male friends try to help lift him and they were unable to pick him up.  [name] wants to know if we could do a virtual visit and call in an antibiotic into the pharmacy.   Call back: [phone]

## 2020-03-18 NOTE — Telephone Encounter (Signed)
Absolutely not. All of this is extremely concerning, especially the fact that he does not have use of his legs. He needs an in hospital evaluation and a virtual video visit calling in an antibiotic is not enough to evaluate him. He needs to go to the ER and we can;t do anything for him virtually.

## 2020-03-20 ENCOUNTER — Telehealth: Payer: Self-pay | Admitting: Family Medicine

## 2020-03-20 ENCOUNTER — Telehealth: Payer: Self-pay | Admitting: Adult Health

## 2020-03-20 NOTE — Telephone Encounter (Signed)
Please advise 

## 2020-03-20 NOTE — Telephone Encounter (Signed)
Rx written and can be faxed 

## 2020-03-20 NOTE — Telephone Encounter (Signed)
Order faxed.

## 2020-03-20 NOTE — Telephone Encounter (Signed)
[name] with [facility] called to get order for electric hospital bed but also to let us know that [name]'s caregiver (who is a friend of family) is having back issues and cannot physically acommodate the patient's needs in changing soiled depends or clothes.    A nurse found [name] sitting in saturated diapers on Wed of this week.   [name] wanted to bring this to our attention that he may need other care.

## 2020-03-20 NOTE — Telephone Encounter (Signed)
[name] from [facility] called about an order for a hospital bed. They have been waiting for about 3-4 months/ he stated that the OT and care giver have called about this as well/ they were at one time advised it had been sent but they have heard or received nothing/ please advise today

## 2020-03-21 ENCOUNTER — Emergency Department: Payer: Medicare Other

## 2020-03-21 ENCOUNTER — Inpatient Hospital Stay
Admission: EM | Admit: 2020-03-21 | Discharge: 2020-04-10 | DRG: 853 | Disposition: E | Payer: Medicare Other | Attending: Internal Medicine | Admitting: Internal Medicine

## 2020-03-21 ENCOUNTER — Encounter: Payer: Self-pay | Admitting: Internal Medicine

## 2020-03-21 ENCOUNTER — Other Ambulatory Visit: Payer: Self-pay

## 2020-03-21 DIAGNOSIS — J930 Spontaneous tension pneumothorax: Secondary | ICD-10-CM | POA: Diagnosis not present

## 2020-03-21 DIAGNOSIS — L89159 Pressure ulcer of sacral region, unspecified stage: Secondary | ICD-10-CM | POA: Diagnosis not present

## 2020-03-21 DIAGNOSIS — L89154 Pressure ulcer of sacral region, stage 4: Secondary | ICD-10-CM | POA: Diagnosis present

## 2020-03-21 DIAGNOSIS — Z79899 Other long term (current) drug therapy: Secondary | ICD-10-CM

## 2020-03-21 DIAGNOSIS — A419 Sepsis, unspecified organism: Secondary | ICD-10-CM | POA: Diagnosis not present

## 2020-03-21 DIAGNOSIS — E440 Moderate protein-calorie malnutrition: Secondary | ICD-10-CM | POA: Diagnosis not present

## 2020-03-21 DIAGNOSIS — G92 Toxic encephalopathy: Secondary | ICD-10-CM | POA: Diagnosis not present

## 2020-03-21 DIAGNOSIS — R627 Adult failure to thrive: Secondary | ICD-10-CM | POA: Diagnosis not present

## 2020-03-21 DIAGNOSIS — R6520 Severe sepsis without septic shock: Secondary | ICD-10-CM | POA: Diagnosis not present

## 2020-03-21 DIAGNOSIS — E876 Hypokalemia: Secondary | ICD-10-CM | POA: Diagnosis present

## 2020-03-21 DIAGNOSIS — J9382 Other air leak: Secondary | ICD-10-CM | POA: Diagnosis not present

## 2020-03-21 DIAGNOSIS — J9601 Acute respiratory failure with hypoxia: Secondary | ICD-10-CM | POA: Diagnosis present

## 2020-03-21 DIAGNOSIS — E785 Hyperlipidemia, unspecified: Secondary | ICD-10-CM | POA: Diagnosis present

## 2020-03-21 DIAGNOSIS — G20 Parkinson's disease: Secondary | ICD-10-CM | POA: Diagnosis present

## 2020-03-21 DIAGNOSIS — J189 Pneumonia, unspecified organism: Secondary | ICD-10-CM

## 2020-03-21 DIAGNOSIS — Z9689 Presence of other specified functional implants: Secondary | ICD-10-CM

## 2020-03-21 DIAGNOSIS — R531 Weakness: Secondary | ICD-10-CM

## 2020-03-21 DIAGNOSIS — D696 Thrombocytopenia, unspecified: Secondary | ICD-10-CM | POA: Diagnosis present

## 2020-03-21 DIAGNOSIS — L03317 Cellulitis of buttock: Secondary | ICD-10-CM | POA: Diagnosis not present

## 2020-03-21 DIAGNOSIS — D649 Anemia, unspecified: Secondary | ICD-10-CM | POA: Diagnosis not present

## 2020-03-21 DIAGNOSIS — Z66 Do not resuscitate: Secondary | ICD-10-CM | POA: Diagnosis not present

## 2020-03-21 DIAGNOSIS — Z818 Family history of other mental and behavioral disorders: Secondary | ICD-10-CM

## 2020-03-21 DIAGNOSIS — E559 Vitamin D deficiency, unspecified: Secondary | ICD-10-CM | POA: Diagnosis present

## 2020-03-21 DIAGNOSIS — F419 Anxiety disorder, unspecified: Secondary | ICD-10-CM | POA: Diagnosis present

## 2020-03-21 DIAGNOSIS — G609 Hereditary and idiopathic neuropathy, unspecified: Secondary | ICD-10-CM | POA: Diagnosis present

## 2020-03-21 DIAGNOSIS — R7881 Bacteremia: Secondary | ICD-10-CM | POA: Diagnosis not present

## 2020-03-21 DIAGNOSIS — Z515 Encounter for palliative care: Secondary | ICD-10-CM | POA: Diagnosis not present

## 2020-03-21 DIAGNOSIS — Z20822 Contact with and (suspected) exposure to covid-19: Secondary | ICD-10-CM | POA: Diagnosis present

## 2020-03-21 DIAGNOSIS — L89153 Pressure ulcer of sacral region, stage 3: Secondary | ICD-10-CM

## 2020-03-21 DIAGNOSIS — A4102 Sepsis due to Methicillin resistant Staphylococcus aureus: Secondary | ICD-10-CM | POA: Diagnosis present

## 2020-03-21 DIAGNOSIS — N179 Acute kidney failure, unspecified: Secondary | ICD-10-CM | POA: Diagnosis present

## 2020-03-21 DIAGNOSIS — E860 Dehydration: Secondary | ICD-10-CM | POA: Diagnosis present

## 2020-03-21 DIAGNOSIS — Z8673 Personal history of transient ischemic attack (TIA), and cerebral infarction without residual deficits: Secondary | ICD-10-CM

## 2020-03-21 DIAGNOSIS — R6521 Severe sepsis with septic shock: Secondary | ICD-10-CM | POA: Diagnosis present

## 2020-03-21 DIAGNOSIS — F332 Major depressive disorder, recurrent severe without psychotic features: Secondary | ICD-10-CM | POA: Diagnosis present

## 2020-03-21 DIAGNOSIS — E870 Hyperosmolality and hypernatremia: Secondary | ICD-10-CM | POA: Diagnosis not present

## 2020-03-21 DIAGNOSIS — Z6822 Body mass index (BMI) 22.0-22.9, adult: Secondary | ICD-10-CM

## 2020-03-21 DIAGNOSIS — Z87891 Personal history of nicotine dependence: Secondary | ICD-10-CM

## 2020-03-21 DIAGNOSIS — E43 Unspecified severe protein-calorie malnutrition: Secondary | ICD-10-CM | POA: Diagnosis present

## 2020-03-21 DIAGNOSIS — N400 Enlarged prostate without lower urinary tract symptoms: Secondary | ICD-10-CM | POA: Diagnosis present

## 2020-03-21 DIAGNOSIS — B9562 Methicillin resistant Staphylococcus aureus infection as the cause of diseases classified elsewhere: Secondary | ICD-10-CM | POA: Diagnosis not present

## 2020-03-21 DIAGNOSIS — E871 Hypo-osmolality and hyponatremia: Secondary | ICD-10-CM | POA: Diagnosis present

## 2020-03-21 DIAGNOSIS — J9690 Respiratory failure, unspecified, unspecified whether with hypoxia or hypercapnia: Secondary | ICD-10-CM

## 2020-03-21 DIAGNOSIS — Z4659 Encounter for fitting and adjustment of other gastrointestinal appliance and device: Secondary | ICD-10-CM

## 2020-03-21 DIAGNOSIS — L89150 Pressure ulcer of sacral region, unstageable: Secondary | ICD-10-CM | POA: Diagnosis not present

## 2020-03-21 DIAGNOSIS — Z7189 Other specified counseling: Secondary | ICD-10-CM | POA: Diagnosis not present

## 2020-03-21 HISTORY — DX: Acute kidney failure, unspecified: N17.9

## 2020-03-21 LAB — URINALYSIS, COMPLETE (UACMP) WITH MICROSCOPIC
Bilirubin Urine: NEGATIVE
Glucose, UA: NEGATIVE mg/dL
Ketones, ur: NEGATIVE mg/dL
Leukocytes,Ua: NEGATIVE
Nitrite: NEGATIVE
Protein, ur: 30 mg/dL — AB
Specific Gravity, Urine: 1.016 (ref 1.005–1.030)
pH: 5 (ref 5.0–8.0)

## 2020-03-21 LAB — CBC WITH DIFFERENTIAL/PLATELET
Abs Immature Granulocytes: 0.1 10*3/uL — ABNORMAL HIGH (ref 0.00–0.07)
Basophils Absolute: 0.1 10*3/uL (ref 0.0–0.1)
Basophils Relative: 1 %
Eosinophils Absolute: 0 10*3/uL (ref 0.0–0.5)
Eosinophils Relative: 0 %
HCT: 35.3 % — ABNORMAL LOW (ref 39.0–52.0)
Hemoglobin: 12.2 g/dL — ABNORMAL LOW (ref 13.0–17.0)
Immature Granulocytes: 1 %
Lymphocytes Relative: 3 %
Lymphs Abs: 0.3 10*3/uL — ABNORMAL LOW (ref 0.7–4.0)
MCH: 30.2 pg (ref 26.0–34.0)
MCHC: 34.6 g/dL (ref 30.0–36.0)
MCV: 87.4 fL (ref 80.0–100.0)
Monocytes Absolute: 0.2 10*3/uL (ref 0.1–1.0)
Monocytes Relative: 2 %
Neutro Abs: 9.7 10*3/uL — ABNORMAL HIGH (ref 1.7–7.7)
Neutrophils Relative %: 93 %
Platelets: 84 10*3/uL — ABNORMAL LOW (ref 150–400)
RBC: 4.04 MIL/uL — ABNORMAL LOW (ref 4.22–5.81)
RDW: 14.5 % (ref 11.5–15.5)
Smear Review: DECREASED
WBC: 10.3 10*3/uL (ref 4.0–10.5)
nRBC: 0 % (ref 0.0–0.2)

## 2020-03-21 LAB — COMPREHENSIVE METABOLIC PANEL
ALT: 20 U/L (ref 0–44)
AST: 98 U/L — ABNORMAL HIGH (ref 15–41)
Albumin: 2.4 g/dL — ABNORMAL LOW (ref 3.5–5.0)
Alkaline Phosphatase: 94 U/L (ref 38–126)
Anion gap: 14 (ref 5–15)
BUN: 85 mg/dL — ABNORMAL HIGH (ref 8–23)
CO2: 23 mmol/L (ref 22–32)
Calcium: 8.4 mg/dL — ABNORMAL LOW (ref 8.9–10.3)
Chloride: 97 mmol/L — ABNORMAL LOW (ref 98–111)
Creatinine, Ser: 4.01 mg/dL — ABNORMAL HIGH (ref 0.61–1.24)
GFR calc Af Amer: 17 mL/min — ABNORMAL LOW (ref >60)
GFR calc non Af Amer: 15 mL/min — ABNORMAL LOW (ref >60)
Glucose, Bld: 115 mg/dL — ABNORMAL HIGH (ref 70–99)
Potassium: 3.4 mmol/L — ABNORMAL LOW (ref 3.5–5.1)
Sodium: 134 mmol/L — ABNORMAL LOW (ref 135–145)
Total Bilirubin: 1.8 mg/dL — ABNORMAL HIGH (ref 0.3–1.2)
Total Protein: 5.9 g/dL — ABNORMAL LOW (ref 6.5–8.1)

## 2020-03-21 LAB — SARS CORONAVIRUS 2 BY RT PCR (HOSPITAL ORDER, PERFORMED IN ~~LOC~~ HOSPITAL LAB): SARS Coronavirus 2: NEGATIVE

## 2020-03-21 LAB — PROTIME-INR
INR: 1.1 (ref 0.8–1.2)
Prothrombin Time: 13.6 seconds (ref 11.4–15.2)

## 2020-03-21 LAB — APTT: aPTT: 35 seconds (ref 24–36)

## 2020-03-21 LAB — LACTIC ACID, PLASMA: Lactic Acid, Venous: 2.7 mmol/L (ref 0.5–1.9)

## 2020-03-21 MED ORDER — VANCOMYCIN HCL 750 MG/150ML IV SOLN
750.0000 mg | Freq: Once | INTRAVENOUS | Status: DC
  Filled 2020-03-21 (×2): qty 150

## 2020-03-21 MED ORDER — NEPRO/CARBSTEADY PO LIQD: 237.0000 mL | Freq: Three times a day (TID) | ORAL | Status: DC | PRN

## 2020-03-21 MED ORDER — SODIUM CHLORIDE 0.9 % IV BOLUS
500.0000 mL | Freq: Once | INTRAVENOUS | Status: AC
  Administered 2020-03-21: 500 mL via INTRAVENOUS

## 2020-03-21 MED ORDER — ACETAMINOPHEN 650 MG RE SUPP: 650.0000 mg | Freq: Four times a day (QID) | RECTAL | Status: DC | PRN

## 2020-03-21 MED ORDER — SODIUM CHLORIDE 0.9 % IV SOLN: 2.0000 g | INTRAVENOUS | Status: DC

## 2020-03-21 MED ORDER — HYDROXYZINE HCL 25 MG PO TABS
25.0000 mg | ORAL_TABLET | Freq: Three times a day (TID) | ORAL | Status: DC | PRN
  Filled 2020-03-21: qty 1

## 2020-03-21 MED ORDER — SODIUM CHLORIDE 0.9 % IV SOLN
500.0000 mg | Freq: Once | INTRAVENOUS | Status: AC
  Administered 2020-03-21: 500 mg via INTRAVENOUS
  Filled 2020-03-21: qty 500

## 2020-03-21 MED ORDER — SODIUM CHLORIDE 0.9 % IV BOLUS (SEPSIS)
1000.0000 mL | Freq: Once | INTRAVENOUS | Status: AC
  Administered 2020-03-21: 1000 mL via INTRAVENOUS

## 2020-03-21 MED ORDER — CARBIDOPA-LEVODOPA 25-100 MG PO TABS
1.0000 | ORAL_TABLET | Freq: Three times a day (TID) | ORAL | Status: DC
  Administered 2020-03-22 – 2020-03-28 (×16): 1 via ORAL
  Filled 2020-03-21 (×23): qty 1

## 2020-03-21 MED ORDER — ONDANSETRON HCL 4 MG/2ML IJ SOLN: 4.0000 mg | Freq: Four times a day (QID) | INTRAMUSCULAR | Status: DC | PRN

## 2020-03-21 MED ORDER — DOCUSATE SODIUM 283 MG RE ENEM: 1.0000 | ENEMA | RECTAL | Status: DC | PRN

## 2020-03-21 MED ORDER — CALCIUM CARBONATE ANTACID 500 MG PO CHEW: 500.0000 mg | CHEWABLE_TABLET | Freq: Four times a day (QID) | ORAL | Status: DC | PRN

## 2020-03-21 MED ORDER — ZOLPIDEM TARTRATE 5 MG PO TABS: 5.0000 mg | ORAL_TABLET | Freq: Every evening | ORAL | Status: DC | PRN

## 2020-03-21 MED ORDER — HEPARIN SODIUM (PORCINE) 5000 UNIT/ML IJ SOLN
5000.0000 [IU] | Freq: Three times a day (TID) | INTRAMUSCULAR | Status: DC
  Administered 2020-03-22 – 2020-03-28 (×18): 5000 [IU] via SUBCUTANEOUS
  Filled 2020-03-21 (×18): qty 1

## 2020-03-21 MED ORDER — VANCOMYCIN VARIABLE DOSE PER UNSTABLE RENAL FUNCTION (PHARMACIST DOSING): Status: DC

## 2020-03-21 MED ORDER — CAMPHOR-MENTHOL 0.5-0.5 % EX LOTN
1.0000 "application " | TOPICAL_LOTION | Freq: Three times a day (TID) | CUTANEOUS | Status: DC | PRN
  Filled 2020-03-21: qty 222

## 2020-03-21 MED ORDER — SODIUM CHLORIDE 0.9 % IV SOLN
2.0000 g | Freq: Once | INTRAVENOUS | Status: AC
  Administered 2020-03-21: 2 g via INTRAVENOUS
  Filled 2020-03-21: qty 20

## 2020-03-21 MED ORDER — DEXTROSE IN LACTATED RINGERS 5 % IV SOLN: INTRAVENOUS | Status: DC

## 2020-03-21 MED ORDER — SORBITOL 70 % SOLN
30.0000 mL | Status: DC | PRN
  Filled 2020-03-21: qty 30

## 2020-03-21 MED ORDER — VITAMIN D (ERGOCALCIFEROL) 1.25 MG (50000 UNIT) PO CAPS
50000.0000 [IU] | ORAL_CAPSULE | ORAL | Status: DC
  Filled 2020-03-21: qty 1

## 2020-03-21 MED ORDER — VANCOMYCIN HCL IN DEXTROSE 1-5 GM/200ML-% IV SOLN
1000.0000 mg | Freq: Once | INTRAVENOUS | Status: AC
  Administered 2020-03-21: 1000 mg via INTRAVENOUS
  Filled 2020-03-21: qty 200

## 2020-03-21 MED ORDER — ONDANSETRON HCL 4 MG PO TABS: 4.0000 mg | ORAL_TABLET | Freq: Four times a day (QID) | ORAL | Status: DC | PRN

## 2020-03-21 MED ORDER — ACETAMINOPHEN 325 MG PO TABS
650.0000 mg | ORAL_TABLET | Freq: Four times a day (QID) | ORAL | Status: DC | PRN
  Administered 2020-03-24 – 2020-03-25 (×3): 650 mg via ORAL
  Filled 2020-03-21 (×4): qty 2

## 2020-03-21 NOTE — H&P (Signed)
History and Physical   [name]:[id number] DOB: [birth year] DOA: 03/16/2020  Referring MD/NP/PA: Dr. Joni Fears  PCP: Doreen Beam, FNP   Outpatient Specialists: None  Patient coming from: Home  Chief Complaint: Failure to thrive  HPI: [name] is a 64 y.o. male with medical history significant of Parkinson's disease, malnutrition, prosthetic hypertrophy, previous CVA, depression, hyperlipidemia and anxiety disorder who has apparently been weak decreased appetite and depressed.  He has lost appetite in the last 3 days.  He was noted to have dark urine and decreased urine output at home.  Patient also has had sacral wound for the last 6 months.  He is having home health apparently with some wound care for some sort.  It however looks to be stage IV and purulent discharge was noted by EMS on arrival.  Patient currently not a good historian.  He is weak and debilitated.  He appears to be having early sepsis but also significant worsening of his renal function.  Previous creatinine in April was 0.97 but currently 4.01.  Also noted hypokalemia generalized debility and decreased albumin to 2.4.  There is concomitant hyponatremia.  Patient appears to be markedly dehydrated which may be contributing to his acute kidney injury.  He is being admitted for further evaluation and treatment..  ED Course: Temperature is 98.2 blood pressure 91/61 pulse 103 respirate of 32 oxygen sats 92% room air.  White count is 10.3 hemoglobin 12.2 and platelets 84 sodium 134 potassium 3.4 chloride 97 CO2 23 BUN 85 creatinine 4.01 and calcium 8.4.  Chest x-ray showed patchy bilateral pulmonary infiltrate consistent with multifocal pneumonia.  COVID-19 screen however is negative.  CT abdomen pelvis shows numerous small cavitary lesion with mild moderately thickened surrounding walls.  This is in the bilateral lung bases.  Septic emboli or atypical infections cannot be excluded.  There is also 1.2 cm  noncalcified lung nodule.  Bilateral pleural effusions and multiple compression fractures especially L1.  This is of unknown chronicity.  Patient is being admitted to the hospital for further evaluation and treatment.  Review of Systems: As per HPI otherwise 10 point review of systems negative.    Past Medical History:  Diagnosis Date  . AKI (acute kidney injury) ([place]) 04/02/2020  . Anxiety   . Cataract   . Depression   . Hyperlipidemia 01/10/2019  . Idiopathic peripheral neuropathy 12/25/2018  . Prostate disease   . Stroke [facility])     Past Surgical History:  Procedure Laterality Date  . COLONOSCOPY WITH PROPOFOL N/A 03/22/2018   Procedure: COLONOSCOPY WITH PROPOFOL;  Surgeon: Jonathon Bellows, MD;  Location: [facility] ENDOSCOPY;  Service: Gastroenterology;  Laterality: N/A;  . HERNIA REPAIR       reports that he has quit smoking. He quit smokeless tobacco use about 3 years ago. He reports previous alcohol use. He reports previous drug use.  No Known Allergies  Family History  Problem Relation Age of Onset  . Depression Father      Prior to Admission medications   Medication Sig Start Date End Date Taking? Authorizing Provider  carbidopa-levodopa (SINEMET IR) 25-100 MG tablet Take 0.5 tab three times a day for one week, then 1 tab three times a day. 12/12/19   [provider]  Cholecalciferol (VITAMIN D3) 1.25 MG (50000 UT) CAPS Take 50,000 Units by mouth every Monday.  09/13/19   [provider]  PRESCRIPTION MEDICATION Place 1 patch onto the skin daily as needed (wound care). Silvercel patches  [provider]    Physical Exam: Vitals:   03/11/2020 2200 03/30/2020 2256 04/02/2020 2300 04/08/2020 2350  BP:  91/61 108/69   Pulse:  98 100   Resp:  (!) 30 (!) 32   Temp:    98 F (36.7 C)  TempSrc:    Oral  SpO2:  93% 92%   Weight: 68 kg         Constitutional: Chronically ill looking, confused no distress Vitals:   04/06/2020 2200 04/05/2020 2256 04/07/2020 2300  04/09/2020 2350  BP:  91/61 108/69   Pulse:  98 100   Resp:  (!) 30 (!) 32   Temp:    98 F (36.7 C)  TempSrc:    Oral  SpO2:  93% 92%   Weight: 68 kg      Eyes: PERRL, lids and conjunctivae normal ENMT: Mucous membranes are dry posterior pharynx clear of any exudate or lesions.Normal dentition.  Neck: normal, supple, no masses, no thyromegaly Respiratory: Decreased air entry bilaterally no crackles. Normal respiratory effort. No accessory muscle use.  Cardiovascular: Sinus tachycardia, no murmurs / rubs / gallops. No extremity edema. 2+ pedal pulses. No carotid bruits.  Abdomen: no tenderness, no masses palpated. No hepatosplenomegaly. Bowel sounds positive.  Musculoskeletal: no clubbing / cyanosis. No joint deformity upper and lower extremities. Good ROM, no contractures. Normal muscle tone.  Skin: Dry coarse skin no rashes, lesions, ulcers. No induration Neurologic: CN 2-12 grossly intact. Sensation intact, DTR normal. Strength 5/5 in all 4.  Psychiatric: Decreased mentation, depressed and withdrawn.     Labs on Admission: I have personally reviewed following labs and imaging studies  CBC: Recent Labs  Lab 03/22/2020 1744  WBC 10.3  NEUTROABS 9.7*  HGB 12.2*  HCT 35.3*  MCV 87.4  PLT 84*   Basic Metabolic Panel: Recent Labs  Lab 03/29/2020 1744  NA 134*  K 3.4*  CL 97*  CO2 23  GLUCOSE 115*  BUN 85*  CREATININE 4.01*  CALCIUM 8.4*   GFR: Estimated Creatinine Clearance: 17.2 mL/min (A) (by C-G formula based on SCr of 4.01 mg/dL (H)). Liver Function Tests: Recent Labs  Lab 03/24/2020 1744  AST 98*  ALT 20  ALKPHOS 94  BILITOT 1.8*  PROT 5.9*  ALBUMIN 2.4*   No results for input(s): LIPASE, AMYLASE in the last 168 hours. No results for input(s): AMMONIA in the last 168 hours. Coagulation Profile: Recent Labs  Lab 03/27/2020 1744  INR 1.1   Cardiac Enzymes: No results for input(s): CKTOTAL, CKMB, CKMBINDEX, TROPONINI in the last 168 hours. BNP (last 3  results) No results for input(s): PROBNP in the last 8760 hours. HbA1C: No results for input(s): HGBA1C in the last 72 hours. CBG: No results for input(s): GLUCAP in the last 168 hours. Lipid Profile: No results for input(s): CHOL, HDL, LDLCALC, TRIG, CHOLHDL, LDLDIRECT in the last 72 hours. Thyroid Function Tests: No results for input(s): TSH, T4TOTAL, FREET4, T3FREE, THYROIDAB in the last 72 hours. Anemia Panel: No results for input(s): VITAMINB12, FOLATE, FERRITIN, TIBC, IRON, RETICCTPCT in the last 72 hours. Urine analysis:    Component Value Date/Time   COLORURINE AMBER (A) 04/03/2020 1852   APPEARANCEUR CLOUDY (A) 03/17/2020 1852   LABSPEC 1.016 03/25/2020 1852   PHURINE 5.0 03/24/2020 1852   GLUCOSEU NEGATIVE 03/11/2020 1852   HGBUR MODERATE (A) 04/01/2020 1852   BILIRUBINUR NEGATIVE 04/05/2020 [place] NEGATIVE 03/30/2020 1852   PROTEINUR 30 (A) 04/08/2020 1852   NITRITE NEGATIVE 03/13/2020 1852  LEUKOCYTESUR NEGATIVE 03/24/2020 1852   Sepsis Labs: '@LABRCNTIP' (procalcitonin:4,lacticidven:4) ) Recent Results (from the past 240 hour(s))  SARS Coronavirus 2 by RT PCR (hospital order, performed in [facility] hospital lab) Nasopharyngeal Nasopharyngeal Swab     Status: None   Collection Time: 03/27/2020  5:43 PM   Specimen: Nasopharyngeal Swab  Result Value Ref Range Status   SARS Coronavirus 2 NEGATIVE NEGATIVE Final    Comment: (NOTE) SARS-CoV-2 target nucleic acids are NOT DETECTED.  The SARS-CoV-2 RNA is generally detectable in upper and lower respiratory specimens during the acute phase of infection. The lowest concentration of SARS-CoV-2 viral copies this assay can detect is 250 copies / mL. A negative result does not preclude SARS-CoV-2 infection and should not be used as the sole basis for treatment or other patient management decisions.  A negative result may occur with improper specimen collection / handling, submission of specimen other than  nasopharyngeal swab, presence of viral mutation(s) within the areas targeted by this assay, and inadequate number of viral copies (<250 copies / mL). A negative result must be combined with clinical observations, patient history, and epidemiological information.  Fact Sheet for Patients:   StrictlyIdeas.no  Fact Sheet for Healthcare Providers: BankingDealers.co.za  This test is not yet approved or  cleared by the [place] FDA and has been authorized for detection and/or diagnosis of SARS-CoV-2 by FDA under an Emergency Use Authorization (EUA).  This EUA will remain in effect (meaning this test can be used) for the duration of the COVID-19 declaration under Section 564(b)(1) of the Act, 21 U.S.C. section 360bbb-3(b)(1), unless the authorization is terminated or revoked sooner.  Performed at [facility], [address] [place], [place] [address]      Radiological Exams on Admission: CT ABDOMEN PELVIS WO CONTRAST  Result Date: 03/18/2020 CLINICAL DATA:  Abdominal pain and decreased appetite x2 days. EXAM: CT ABDOMEN AND PELVIS WITHOUT CONTRAST TECHNIQUE: Multidetector CT imaging of the abdomen and pelvis was performed following the standard protocol without IV contrast. COMPARISON:  Lumbar spine MRI, dated January 08, 2018. FINDINGS: Lower chest: Numerous small cavitary lesions, with mild to moderately thickened surrounding walls, are seen scattered throughout the bilateral lung bases. A 1.2 cm noncalcified lung nodule is seen within the right lung base (axial CT image 10, CT series number 3). Mild to moderate severity areas of atelectasis and/or infiltrate are noted along the posterior aspects of the bilateral lung bases. Very small bilateral pleural effusions are seen. A very small predominantly anterior pericardial effusion is noted. Hepatobiliary: No focal liver abnormality is seen. No gallstones, gallbladder wall thickening, or  biliary dilatation. Pancreas: Unremarkable. No pancreatic ductal dilatation or surrounding inflammatory changes. Spleen: Normal in size without focal abnormality. Adrenals/Urinary Tract: The right adrenal gland is normal in size and appearance. A 3.4 cm x 1.3 cm low-attenuation left adrenal mass is seen (axial CT image 33, CT series number 2). Kidneys are normal, without renal calculi, focal lesion, or hydronephrosis. Bladder is unremarkable. Stomach/Bowel: Stomach is within normal limits. Appendix appears normal. No evidence of bowel dilatation. Noninflamed diverticula are seen within the descending and sigmoid colon. Vascular/Lymphatic: There is mild to moderate severity calcification of the abdominal aorta and bilateral common iliac arteries, without evidence of aneurysmal dilatation. No enlarged abdominal or pelvic lymph nodes. Reproductive: There is mild to moderate severity enlargement of the prostate gland. Other: Multiple small surgical coils are seen along the anterior pelvic wall. No abdominopelvic ascites. Musculoskeletal: A compression fracture deformity of indeterminate age  is seen at the level of L1. The this is not seen on the prior lumbar spine MRI, dated January 08, 2018. No retropulsion of fracture fragments is identified. Mild-to-moderate severity multilevel degenerative changes seen throughout the lumbar spine IMPRESSION: 1. Numerous small cavitary lesions, with mild to moderately thickened surrounding walls, scattered throughout the bilateral lung bases. While this may be secondary to an atypical infectious process, sequelae associated with septic emboli cannot be excluded. 2. 1.2 cm noncalcified lung nodule within the right lung base. Correlation with follow-up chest CT is recommended to determine the presence or absence of additional noncalcified lung nodules. Additional follow-up with a nuclear medicine PET/CT should be considered. 3. Very small bilateral pleural effusions. 4. Colonic  diverticulosis. 5. Compression fracture deformity of indeterminate age at the level of L1. The this is not seen on the prior lumbar spine MRI, dated January 08, 2018. Correlation with point tenderness is recommended. Additional MRI follow-up should be considered. 6. Mild to moderate severity enlargement of the prostate gland. 7. Aortic atherosclerosis. Aortic Atherosclerosis (ICD10-I70.0). Electronically Signed   By: Virgina Norfolk M.D.   On: 04/06/2020 20:19   DG Chest Port 1 View  Result Date: 04/04/2020 CLINICAL DATA:  Sepsis fever, decreased appetite, dark foul-smelling urine, Parkinson's disease, former smoker EXAM: PORTABLE CHEST 1 VIEW COMPARISON:  Portable exam 1753 hours compared to 09/30/2019 FINDINGS: Normal heart size, mediastinal contours, and pulmonary vascularity. Patchy airspace infiltrates bilaterally consistent with multifocal pneumonia. No pleural effusion or pneumothorax. Osseous structures unremarkable. IMPRESSION: Patchy BILATERAL pulmonary infiltrates consistent with multifocal pneumonia; cannot exclude COVID-19 with this appearance. Electronically Signed   By: Lavonia Dana M.D.   On: 03/12/2020 18:12      Assessment/Plan Principal Problem:   AKI (acute kidney injury) ([place]) Active Problems:   Malnutrition of moderate degree   MDD (major depressive disorder), recurrent episode, severe (HCC)   Hyperlipidemia   Primary Parkinsonism ([place])   Weakness generalized   Hyponatremia   Hypokalemia   FTT (failure to thrive) in adult   Thrombocytopenia ([place])   Community acquired pneumonia   Sepsis ([place])   Sacral decubitus ulcer, stage IV ([place])     #1 acute kidney injury: Most likely prerenal due to severe dehydration.  Decreased oral intake may be the cause.  This could be related to his Parkinson's disease with dysphagia.  Could also be due to acute illness with infections especially pneumonia.  May of also have UTI will check urinalysis.  Aggressive hydration.  Avoid nephrotoxic  medications.  Follow renal function.  #2 sepsis syndrome with endorgan damage: Has met sepsis criteria with evidence of infection and based on his heart rate respiratory rate.  Patient is also hypotensive which makes his severe sepsis.  At this point will initiate cefepime.  May add vancomycin as well due to his infected sacral ulcer.  The cause of the sepsis could also be pneumonia or infectious decubitus ulcer.  Continue management.  #3 suspected pneumonia: Based on chest x-ray findings and CT findings of lower lobes lesions.  Patient is COVID-19 negative.  Continue empiric treatment for pneumonia.  #4 infected decubitus ulcer stage IV: Will need wound care consult.  Possibly surgical consult.  #5 adult failure to thrive: Continue improving nutrition.  #6 hypotension: Most likely due to severe dehydration.  Continue monitoring.  #7 Parkinson's disease: Resume home regimen when practicable.  #8 major depressive disorder: Resume home regimen once confirmed.  #9 thrombocytopenia: Probably reactive.  Watch closely especially on anticoagulation.  #10 hyponatremia:  Mild.  Probably due to dehydration.  Hydrate with saline and monitor.  #11 hypokalemia: Replete potassium   DVT prophylaxis: Heparin Code Status: Full code Family Communication: No family at bedside Disposition Plan: To be determined Consults called: None but may need nephrology consult if no improvement Admission status: Inpatient  Severity of Illness: The appropriate patient status for this patient is INPATIENT. Inpatient status is judged to be reasonable and necessary in order to provide the required intensity of service to ensure the patient's safety. The patient's presenting symptoms, physical exam findings, and initial radiographic and laboratory data in the context of their chronic comorbidities is felt to place them at high risk for further clinical deterioration. Furthermore, it is not anticipated that the patient will  be medically stable for discharge from the hospital within 2 midnights of admission. The following factors support the patient status of inpatient.   " The patient's presenting symptoms include generalized weakness . " The worrisome physical exam findings include altered mental status. " The initial radiographic and laboratory data are worrisome because of possible pneumonia and sepsis. " The chronic co-morbidities include Parkinson's disease.   * I certify that at the point of admission it is my clinical judgment that the patient will require inpatient hospital care spanning beyond 2 midnights from the point of admission due to high intensity of service, high risk for further deterioration and high frequency of surveillance required.Barbette Merino MD [facility] Pager [phone]  If 7PM-7AM, please contact night-coverage [email or web address] Password [facility]  03/22/2020, 12:34 AM

## 2020-03-21 NOTE — ED Provider Notes (Signed)
[facility] Emergency Department Provider Note  ____________________________________________  Time seen: Approximately 7:08 PM  I have reviewed the triage vital signs and the nursing notes.   HISTORY  Chief Complaint Urinary Tract Infection    Level 5 Caveat: Portions of the History and Physical including HPI and review of systems are unable to be completely obtained due to patient being a poor historian    [name] is a 64 y.o. male with a history of stroke, Parkinson's disease, malnutrition who is brought to the ED due to decreased appetite for the past 2 to 3 days, decreased oral intake, generalized weakness.  Also has dark urine and decreased urine output.  Also noted to have a sacral wound for the past 6 months and EMS report the it has purulent discharge and foul smell.  Patient denies any acute pain.      Past Medical History:  Diagnosis Date  . Anxiety   . Cataract   . Depression   . Hyperlipidemia 01/10/2019  . Idiopathic peripheral neuropathy 12/25/2018  . Prostate disease   . Stroke [facility])      Patient Active Problem List   Diagnosis Date Noted  . Bradykinesia 11/22/2019  . Tremor 11/22/2019  . At risk for falling 11/22/2019  . Frequency of urination and polyuria 11/22/2019  . Weakness generalized 11/22/2019  . Primary Parkinsonism ([place]) 11/13/2019  . Hyperlipidemia 01/10/2019  . 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 ([place]) 12/27/2018  . Gait difficulty 12/25/2018  . Idiopathic peripheral neuropathy 12/25/2018  . Benign prostatic hyperplasia with urinary frequency 04/04/2018  . Erectile dysfunction 04/04/2018  . Vitamin D insufficiency 03/14/2018  . Weakness of back 03/14/2018  . Elevated CK 03/14/2018  . B12 deficiency 03/14/2018  . High risk medications (not anticoagulants) long-term use 03/14/2018  . Onychomycosis 03/14/2018  . Catatonia 01/12/2018  . Malnutrition of moderate  degree 01/09/2018  . Pressure injury of skin 01/09/2018  . MDD (major depressive disorder), recurrent episode, severe ([place]) 01/09/2018  . Major depressive disorder, recurrent severe without psychotic features ([place]) 01/09/2018  . Back pain 01/08/2018  . Shuffling gait 01/08/2018     Past Surgical History:  Procedure Laterality Date  . COLONOSCOPY WITH PROPOFOL N/A 03/22/2018   Procedure: COLONOSCOPY WITH PROPOFOL;  Surgeon: Jonathon Bellows, MD;  Location: [facility] ENDOSCOPY;  Service: Gastroenterology;  Laterality: N/A;  . HERNIA REPAIR       Prior to Admission medications   Medication Sig Start Date End Date Taking? Authorizing Provider  carbidopa-levodopa (SINEMET IR) 25-100 MG tablet Take 0.5 tab three times a day for one week, then 1 tab three times a day. 12/12/19   [provider]  Cholecalciferol (VITAMIN D3) 1.25 MG (50000 UT) CAPS Take 50,000 Units by mouth every Monday.  09/13/19   [provider]  PRESCRIPTION MEDICATION Place 1 patch onto the skin daily as needed (wound care). Silvercel patches    [provider]     Allergies Patient has no known allergies.   Family History  Problem Relation Age of Onset  . Depression Father     Social History Social History   Tobacco Use  . Smoking status: Former Research scientist (life sciences)  . Smokeless tobacco: Former Systems developer    Quit date: 01/10/2017  Vaping Use  . Vaping Use: Never used  Substance Use Topics  . Alcohol use: Not Currently  . Drug use: Not Currently    Review of Systems Level 5 Caveat: Portions of the History and  Physical including HPI and review of systems are unable to be completely obtained due to patient being a poor historian   Constitutional:   No known fever.  ENT:   No rhinorrhea. Cardiovascular:   No chest pain or syncope. Respiratory:   No dyspnea positive cough. Gastrointestinal:   Negative for abdominal pain, vomiting and diarrhea.  Musculoskeletal: Positive sacral  wound ____________________________________________   PHYSICAL EXAM:  VITAL SIGNS: ED Triage Vitals  Enc Vitals Group     BP 03/23/2020 1728 96/67     Pulse Rate 03/31/2020 1728 95     Resp 03/24/2020 1728 (!) 25     Temp 04/05/2020 1728 98.2 F (36.8 C)     Temp Source 03/26/2020 1728 Oral     SpO2 03/14/2020 1728 94 %     Weight --      Height --      Head Circumference --      Peak Flow --      Pain Score 03/11/2020 1729 3     Pain Loc --      Pain Edu? --      Excl. in [place]? --     Vital signs reviewed, nursing assessments reviewed.   Constitutional:   Alert and oriented.  Ill-appearing Eyes:   Conjunctivae are normal. EOMI. PERRL. ENT      Head:   Normocephalic and atraumatic.      Nose:   No congestion/rhinnorhea.       Mouth/Throat:   Dry mucous membranes, no pharyngeal erythema. No peritonsillar mass.       Neck:   No meningismus. Full ROM. Hematological/Lymphatic/Immunilogical:   No cervical lymphadenopathy. Cardiovascular:   Tachycardia heart rate 95. Symmetric bilateral radial and DP pulses.  No murmurs. Cap refill less than 2 seconds. Respiratory: Tachypnea.  Bilateral basilar crackles.  No wheezing.. Gastrointestinal:   Soft, nontender but generalized guarding.  There is lower abdominal fullness..  Musculoskeletal:   Normal range of motion in all extremities. No joint effusions.  No lower extremity tenderness.  No edema.  There is a broad sacral decubitus ulcer, mostly stage III but with some areas that are unstageable due to necrotic tissue.  There is purulent discharge and foul smell. Neurologic:   Normal speech and language.  Motor grossly intact. No acute focal neurologic deficits are appreciated.  Skin:    Skin is warm, dry with sacral wound as above.. No rash noted.  No petechiae, purpura, or bullae.  ____________________________________________    LABS (pertinent positives/negatives) (all labs ordered are listed, but only abnormal results are displayed) Labs  Reviewed  LACTIC ACID, PLASMA - Abnormal; Notable for the following components:      Result Value   Lactic Acid, Venous 2.7 (*)    All other components within normal limits  COMPREHENSIVE METABOLIC PANEL - Abnormal; Notable for the following components:   Sodium 134 (*)    Potassium 3.4 (*)    Chloride 97 (*)    Glucose, Bld 115 (*)    BUN 85 (*)    Creatinine, Ser 4.01 (*)    Calcium 8.4 (*)    Total Protein 5.9 (*)    Albumin 2.4 (*)    AST 98 (*)    Total Bilirubin 1.8 (*)    GFR calc non Af Amer 15 (*)    GFR calc Af Amer 17 (*)    All other components within normal limits  CBC WITH DIFFERENTIAL/PLATELET - Abnormal; Notable for the following components:  RBC 4.04 (*)    Hemoglobin 12.2 (*)    HCT 35.3 (*)    Platelets 84 (*)    Neutro Abs 9.7 (*)    Lymphs Abs 0.3 (*)    Abs Immature Granulocytes 0.10 (*)    All other components within normal limits  URINALYSIS, COMPLETE (UACMP) WITH MICROSCOPIC - Abnormal; Notable for the following components:   Color, Urine AMBER (*)    APPearance CLOUDY (*)    Hgb urine dipstick MODERATE (*)    Protein, ur 30 (*)    Bacteria, UA RARE (*)    All other components within normal limits  SARS CORONAVIRUS 2 BY RT PCR (HOSPITAL ORDER, [place] [facility])  CULTURE, BLOOD (ROUTINE X 2)  CULTURE, BLOOD (ROUTINE X 2)  URINE CULTURE  PROTIME-INR  APTT  LACTIC ACID, PLASMA   ____________________________________________   EKG    ____________________________________________    RADIOLOGY  CT ABDOMEN PELVIS WO CONTRAST  Result Date: 03/13/2020 CLINICAL DATA:  Abdominal pain and decreased appetite x2 days. EXAM: CT ABDOMEN AND PELVIS WITHOUT CONTRAST TECHNIQUE: Multidetector CT imaging of the abdomen and pelvis was performed following the standard protocol without IV contrast. COMPARISON:  Lumbar spine MRI, dated January 08, 2018. FINDINGS: Lower chest: Numerous small cavitary lesions, with mild to moderately thickened  surrounding walls, are seen scattered throughout the bilateral lung bases. A 1.2 cm noncalcified lung nodule is seen within the right lung base (axial CT image 10, CT series number 3). Mild to moderate severity areas of atelectasis and/or infiltrate are noted along the posterior aspects of the bilateral lung bases. Very small bilateral pleural effusions are seen. A very small predominantly anterior pericardial effusion is noted. Hepatobiliary: No focal liver abnormality is seen. No gallstones, gallbladder wall thickening, or biliary dilatation. Pancreas: Unremarkable. No pancreatic ductal dilatation or surrounding inflammatory changes. Spleen: Normal in size without focal abnormality. Adrenals/Urinary Tract: The right adrenal gland is normal in size and appearance. A 3.4 cm x 1.3 cm low-attenuation left adrenal mass is seen (axial CT image 33, CT series number 2). Kidneys are normal, without renal calculi, focal lesion, or hydronephrosis. Bladder is unremarkable. Stomach/Bowel: Stomach is within normal limits. Appendix appears normal. No evidence of bowel dilatation. Noninflamed diverticula are seen within the descending and sigmoid colon. Vascular/Lymphatic: There is mild to moderate severity calcification of the abdominal aorta and bilateral common iliac arteries, without evidence of aneurysmal dilatation. No enlarged abdominal or pelvic lymph nodes. Reproductive: There is mild to moderate severity enlargement of the prostate gland. Other: Multiple small surgical coils are seen along the anterior pelvic wall. No abdominopelvic ascites. Musculoskeletal: A compression fracture deformity of indeterminate age is seen at the level of L1. The this is not seen on the prior lumbar spine MRI, dated January 08, 2018. No retropulsion of fracture fragments is identified. Mild-to-moderate severity multilevel degenerative changes seen throughout the lumbar spine IMPRESSION: 1. Numerous small cavitary lesions, with mild to  moderately thickened surrounding walls, scattered throughout the bilateral lung bases. While this may be secondary to an atypical infectious process, sequelae associated with septic emboli cannot be excluded. 2. 1.2 cm noncalcified lung nodule within the right lung base. Correlation with follow-up chest CT is recommended to determine the presence or absence of additional noncalcified lung nodules. Additional follow-up with a nuclear medicine PET/CT should be considered. 3. Very small bilateral pleural effusions. 4. Colonic diverticulosis. 5. Compression fracture deformity of indeterminate age at the level of L1. The this is not  seen on the prior lumbar spine MRI, dated January 08, 2018. Correlation with point tenderness is recommended. Additional MRI follow-up should be considered. 6. Mild to moderate severity enlargement of the prostate gland. 7. Aortic atherosclerosis. Aortic Atherosclerosis (ICD10-I70.0). Electronically Signed   By: Virgina Norfolk M.D.   On: 04/07/2020 20:19   DG Chest Port 1 View  Result Date: 03/17/2020 CLINICAL DATA:  Sepsis fever, decreased appetite, dark foul-smelling urine, Parkinson's disease, former smoker EXAM: PORTABLE CHEST 1 VIEW COMPARISON:  Portable exam 1753 hours compared to 09/30/2019 FINDINGS: Normal heart size, mediastinal contours, and pulmonary vascularity. Patchy airspace infiltrates bilaterally consistent with multifocal pneumonia. No pleural effusion or pneumothorax. Osseous structures unremarkable. IMPRESSION: Patchy BILATERAL pulmonary infiltrates consistent with multifocal pneumonia; cannot exclude COVID-19 with this appearance. Electronically Signed   By: Lavonia Dana M.D.   On: 03/26/2020 18:12    ____________________________________________   PROCEDURES .Critical Care Performed by: Carrie Mew, MD Authorized by: Carrie Mew, MD   Critical care provider statement:    Critical care time (minutes):  35   Critical care time was exclusive of:   Separately billable procedures and treating other patients   Critical care was necessary to treat or prevent imminent or life-threatening deterioration of the following conditions:  Sepsis, dehydration and renal failure   Critical care was time spent personally by me on the following activities:  Development of treatment plan with patient or surrogate, discussions with consultants, evaluation of patient's response to treatment, examination of patient, obtaining history from patient or surrogate, ordering and performing treatments and interventions, ordering and review of laboratory studies, ordering and review of radiographic studies, pulse oximetry, re-evaluation of patient's condition and review of old charts    ____________________________________________  DIFFERENTIAL DIAGNOSIS   Sacral cellulitis, pelvic abscess, pneumonia, COVID-19, UTI, sepsis, dehydration, electrolyte abnormality  CLINICAL IMPRESSION / ASSESSMENT AND PLAN / ED COURSE  Medications ordered in the ED: Medications  azithromycin (ZITHROMAX) 500 mg in sodium chloride 0.9 % 250 mL IVPB (has no administration in time range)  sodium chloride 0.9 % bolus 1,000 mL (0 mLs Intravenous Stopped 03/29/2020 1947)  vancomycin (VANCOCIN) IVPB 1000 mg/200 mL premix (1,000 mg Intravenous New Bag/Given 04/02/2020 1947)  cefTRIAXone (ROCEPHIN) 2 g in sodium chloride 0.9 % 100 mL IVPB (0 g Intravenous Stopped 03/27/2020 1945)    Pertinent labs & imaging results that were available during my care of the patient were reviewed by me and considered in my medical decision making (see chart for details).   Bronc Brosseau Rasp was evaluated in Emergency Department on 03/15/2020 for the symptoms described in the history of present illness. He was evaluated in the context of the global COVID-19 pandemic, which necessitated consideration that the patient might be at risk for infection with the SARS-CoV-2 virus that causes COVID-19. Institutional protocols and  algorithms that pertain to the evaluation of patients at risk for COVID-19 are in a state of rapid change based on information released by regulatory bodies including the CDC and federal and state organizations. These policies and algorithms were followed during the patient's care in the ED.     Clinical Course as of Mar 21 2048  Sat Mar 21, 2020  1735 Patient presents with generalized weakness, poor oral intake for the last 3 days, appears dehydrated.  Not septic based on initial vital signs, but I am worried about UTI versus cellulitis and deep tissue infection from large sacral decubitus wound.  Will check labs, CT scan, give IV fluids.   [PS]  2040 Chest x-ray shows multifocal pneumonia, but Covid test is negative.  Labs significant for acute kidney injury with a creatinine of 4 compared to a baseline of 1.  CT abdomen pelvis does not show any intra-abdominal pathology or pelvic abscesses.  Add on azithromycin for atypical pneumonia coverage, plan to admit.   [PS]    Clinical Course User Index [PS] Carrie Mew, MD     ____________________________________________   FINAL CLINICAL IMPRESSION(S) / ED DIAGNOSES    Final diagnoses:  AKI (acute kidney injury) ([place])  Sacral decubitus ulcer, stage III ([place])  Cellulitis of buttock  Multifocal pneumonia  Sepsis with acute renal failure without septic shock, due to unspecified organism, unspecified acute renal failure type [facility])     ED Discharge Orders    None      Portions of this note were generated with dragon dictation software. Dictation errors may occur despite best attempts at proofreading.   Carrie Mew, MD 04/03/2020 2049

## 2020-03-21 NOTE — Consult Note (Signed)
PHARMACY -  BRIEF ANTIBIOTIC NOTE   Pharmacy has received consult(s) for Vancomycin from an ED provider.  The patient's profile has been reviewed for ht/wt/allergies/indication/available labs.    One time order(s) placed for Vancomycin 1g IV x 1 dose.  Further antibiotics/pharmacy consults should be ordered by admitting physician if indicated.                       Thank you, [name] 03/26/2020  5:53 PM

## 2020-03-21 NOTE — ED Triage Notes (Signed)
Pt to ED via [facility] from home. Per EMS pt has had decreased appetite x2 days and has only a small amount of water today. EMS also pt has had intermittent fever x1wk. Per EMS pt's wife stating dark and foul smelling urine. Pt in end stage PD. Pt also presents with sacral wound x6 months. VSS. CBG 117.

## 2020-03-21 NOTE — ED Notes (Signed)
This RN updated pt's wife, [name], on POC.

## 2020-03-21 NOTE — ED Notes (Signed)
MD at bedside; BP 86/61, bolus of NS 500 mL ordered.

## 2020-03-21 NOTE — Progress Notes (Signed)
Pharmacy Antibiotic Note  [name] is a 64 y.o. male admitted on 03/29/2020 with pneumonia.  Pharmacy has been consulted for vanc/cefepime dosing.  Plan: Patient received vanc 1.75g IV load and cefepime 2g IV x 1 in ED  Will dose per unstable renal function for now and check CMP w/ am labs to assess renal function as patient is in AKI.  Will continue cefepime 2g IV q24h per CrCl 11 - 29 ml/min and will continue to monitor renal function.  Weight: 68 kg (149 lb 14.6 oz)  Temp (24hrs), Avg:98.2 F (36.8 C), Min:98.2 F (36.8 C), Max:98.2 F (36.8 C)  Recent Labs  Lab 03/12/2020 1744  WBC 10.3  CREATININE 4.01*  LATICACIDVEN 2.7*    Estimated Creatinine Clearance: 17.2 mL/min (A) (by C-G formula based on SCr of 4.01 mg/dL (H)).    No Known Allergies   Thank you for allowing pharmacy to be a part of this patient's care.  Tobie Lords, PharmD, BCPS Clinical Pharmacist 03/26/2020 10:38 PM

## 2020-03-22 ENCOUNTER — Inpatient Hospital Stay: Payer: Medicare Other

## 2020-03-22 ENCOUNTER — Inpatient Hospital Stay: Admit: 2020-03-22 | Payer: Medicare Other

## 2020-03-22 ENCOUNTER — Encounter: Payer: Self-pay | Admitting: Internal Medicine

## 2020-03-22 DIAGNOSIS — D696 Thrombocytopenia, unspecified: Secondary | ICD-10-CM | POA: Diagnosis present

## 2020-03-22 DIAGNOSIS — L89150 Pressure ulcer of sacral region, unstageable: Secondary | ICD-10-CM

## 2020-03-22 DIAGNOSIS — L89154 Pressure ulcer of sacral region, stage 4: Secondary | ICD-10-CM | POA: Diagnosis present

## 2020-03-22 DIAGNOSIS — R6520 Severe sepsis without septic shock: Secondary | ICD-10-CM | POA: Diagnosis present

## 2020-03-22 DIAGNOSIS — R627 Adult failure to thrive: Secondary | ICD-10-CM | POA: Diagnosis present

## 2020-03-22 DIAGNOSIS — E876 Hypokalemia: Secondary | ICD-10-CM | POA: Diagnosis present

## 2020-03-22 DIAGNOSIS — A419 Sepsis, unspecified organism: Secondary | ICD-10-CM | POA: Diagnosis present

## 2020-03-22 DIAGNOSIS — E871 Hypo-osmolality and hyponatremia: Secondary | ICD-10-CM | POA: Diagnosis present

## 2020-03-22 DIAGNOSIS — J189 Pneumonia, unspecified organism: Secondary | ICD-10-CM | POA: Diagnosis present

## 2020-03-22 LAB — COMPREHENSIVE METABOLIC PANEL
ALT: 17 U/L (ref 0–44)
AST: 93 U/L — ABNORMAL HIGH (ref 15–41)
Albumin: 1.7 g/dL — ABNORMAL LOW (ref 3.5–5.0)
Alkaline Phosphatase: 78 U/L (ref 38–126)
Anion gap: 16 — ABNORMAL HIGH (ref 5–15)
BUN: 85 mg/dL — ABNORMAL HIGH (ref 8–23)
CO2: 19 mmol/L — ABNORMAL LOW (ref 22–32)
Calcium: 7.8 mg/dL — ABNORMAL LOW (ref 8.9–10.3)
Chloride: 101 mmol/L (ref 98–111)
Creatinine, Ser: 3.94 mg/dL — ABNORMAL HIGH (ref 0.61–1.24)
GFR calc Af Amer: 17 mL/min — ABNORMAL LOW (ref >60)
GFR calc non Af Amer: 15 mL/min — ABNORMAL LOW (ref >60)
Glucose, Bld: 179 mg/dL — ABNORMAL HIGH (ref 70–99)
Potassium: 3.3 mmol/L — ABNORMAL LOW (ref 3.5–5.1)
Sodium: 136 mmol/L (ref 135–145)
Total Bilirubin: 1.8 mg/dL — ABNORMAL HIGH (ref 0.3–1.2)
Total Protein: 4.8 g/dL — ABNORMAL LOW (ref 6.5–8.1)

## 2020-03-22 LAB — CBC
HCT: 29.6 % — ABNORMAL LOW (ref 39.0–52.0)
Hemoglobin: 10.2 g/dL — ABNORMAL LOW (ref 13.0–17.0)
MCH: 30.7 pg (ref 26.0–34.0)
MCHC: 34.5 g/dL (ref 30.0–36.0)
MCV: 89.2 fL (ref 80.0–100.0)
Platelets: 58 10*3/uL — ABNORMAL LOW (ref 150–400)
RBC: 3.32 MIL/uL — ABNORMAL LOW (ref 4.22–5.81)
RDW: 15 % (ref 11.5–15.5)
WBC: 11.5 10*3/uL — ABNORMAL HIGH (ref 4.0–10.5)
nRBC: 0 % (ref 0.0–0.2)

## 2020-03-22 LAB — BLOOD CULTURE ID PANEL (REFLEXED) - BCID2

## 2020-03-22 LAB — NA AND K (SODIUM & POTASSIUM), RAND UR
Potassium Urine: 69 mmol/L
Sodium, Ur: <10 mmol/L

## 2020-03-22 LAB — CREATININE, URINE, RANDOM: Creatinine, Urine: 193 mg/dL

## 2020-03-22 LAB — PREALBUMIN: Prealbumin: <5 mg/dL — ABNORMAL LOW (ref 18–38)

## 2020-03-22 LAB — C-REACTIVE PROTEIN: CRP: 31.9 mg/dL — ABNORMAL HIGH (ref <1.0)

## 2020-03-22 LAB — MRSA PCR SCREENING: MRSA by PCR: POSITIVE — AB

## 2020-03-22 LAB — LACTIC ACID, PLASMA
Lactic Acid, Venous: 3.2 mmol/L (ref 0.5–1.9)
Lactic Acid, Venous: 4.3 mmol/L (ref 0.5–1.9)

## 2020-03-22 LAB — SEDIMENTATION RATE: Sed Rate: 2 mm/hr (ref 0–20)

## 2020-03-22 LAB — STREP PNEUMONIAE URINARY ANTIGEN: Strep Pneumo Urinary Antigen: NEGATIVE

## 2020-03-22 MED ORDER — SODIUM CHLORIDE 0.9 % IV BOLUS
1000.0000 mL | Freq: Once | INTRAVENOUS | Status: AC
  Administered 2020-03-22: 1000 mL via INTRAVENOUS

## 2020-03-22 MED ORDER — ORAL CARE MOUTH RINSE
15.0000 mL | Freq: Two times a day (BID) | OROMUCOSAL | Status: DC
  Administered 2020-03-22 – 2020-03-23 (×3): 15 mL via OROMUCOSAL

## 2020-03-22 MED ORDER — MUPIROCIN 2 % EX OINT
1.0000 "application " | TOPICAL_OINTMENT | Freq: Two times a day (BID) | CUTANEOUS | Status: AC
  Administered 2020-03-22 – 2020-03-26 (×10): 1 via NASAL
  Filled 2020-03-22 (×2): qty 22

## 2020-03-22 MED ORDER — CHLORHEXIDINE GLUCONATE CLOTH 2 % EX PADS
6.0000 | MEDICATED_PAD | Freq: Every day | CUTANEOUS | Status: DC
  Administered 2020-03-22 – 2020-03-28 (×7): 6 via TOPICAL

## 2020-03-22 MED ORDER — NOREPINEPHRINE 4 MG/250ML-% IV SOLN
0.0000 ug/min | INTRAVENOUS | Status: DC
  Administered 2020-03-22 (×2): 2 ug/min via INTRAVENOUS
  Administered 2020-03-24: 30 ug/min via INTRAVENOUS
  Administered 2020-03-24 (×2): 20 ug/min via INTRAVENOUS
  Administered 2020-03-24: 8 ug/min via INTRAVENOUS
  Administered 2020-03-24: 4 ug/min via INTRAVENOUS
  Administered 2020-03-25: 15 ug/min via INTRAVENOUS
  Filled 2020-03-22 (×8): qty 250

## 2020-03-22 MED ORDER — SODIUM CHLORIDE 0.9 % IV SOLN
3.0000 g | Freq: Two times a day (BID) | INTRAVENOUS | Status: DC
  Administered 2020-03-23 – 2020-03-24 (×4): 3 g via INTRAVENOUS
  Filled 2020-03-22 (×4): qty 3
  Filled 2020-03-22: qty 8

## 2020-03-22 MED ORDER — CHLORHEXIDINE GLUCONATE CLOTH 2 % EX PADS
6.0000 | MEDICATED_PAD | Freq: Every day | CUTANEOUS | Status: DC
  Administered 2020-03-23 – 2020-03-27 (×5): 6 via TOPICAL
  Filled 2020-03-22: qty 6

## 2020-03-22 MED ORDER — CHLORHEXIDINE GLUCONATE 0.12 % MT SOLN
15.0000 mL | Freq: Two times a day (BID) | OROMUCOSAL | Status: DC
  Administered 2020-03-22 – 2020-03-28 (×13): 15 mL via OROMUCOSAL
  Filled 2020-03-22 (×10): qty 15

## 2020-03-22 MED ORDER — PIPERACILLIN-TAZOBACTAM 3.375 G IVPB 30 MIN: 3.3750 g | Freq: Three times a day (TID) | INTRAVENOUS | Status: DC

## 2020-03-22 MED ORDER — SODIUM CHLORIDE 0.9 % IV SOLN
250.0000 mL | INTRAVENOUS | Status: DC
  Administered 2020-03-22 – 2020-03-23 (×2): 250 mL via INTRAVENOUS

## 2020-03-22 MED ORDER — PIPERACILLIN-TAZOBACTAM 3.375 G IVPB
3.3750 g | Freq: Two times a day (BID) | INTRAVENOUS | Status: DC
  Administered 2020-03-22 (×2): 3.375 g via INTRAVENOUS
  Filled 2020-03-22 (×2): qty 50

## 2020-03-22 MED ORDER — LACTATED RINGERS IV BOLUS
1000.0000 mL | Freq: Once | INTRAVENOUS | Status: AC
  Administered 2020-03-22: 1000 mL via INTRAVENOUS

## 2020-03-22 MED ORDER — SODIUM CHLORIDE 0.9 % IV SOLN
500.0000 mg | INTRAVENOUS | Status: DC
  Filled 2020-03-22: qty 500

## 2020-03-22 MED ORDER — POTASSIUM CHLORIDE IN NACL 20-0.9 MEQ/L-% IV SOLN
INTRAVENOUS | Status: DC
  Filled 2020-03-22 (×8): qty 1000

## 2020-03-22 NOTE — Progress Notes (Signed)
Shift summary:  - Patient admitted with PNA and AKI. - Airborne Precautions DC'd by MD.

## 2020-03-22 NOTE — Consult Note (Signed)
Reason for Consult:unstageable sacral pressure ulcer (infected) Referring Physician: Dr. Patsey Berthold, PCCM  [name] is an 64 y.o. male.  HPI: He was admitted to the ICU overnight after presenting to the emergency department with weakness, malaise, and poor appetite.  He also apparently had decreased urine output.  He was found to have severe sepsis and acute kidney injury.  He has a history of Parkinson's disease, prior stroke, severe depression (status post ECT treatment) and other comorbidities.  EMS noted purulent drainage and foul odor from a large sacral pressure ulcer.  General surgery has been consulted for further evaluation of an unstageable, likely infected sacral decubitus ulcer.    The entirety of this history was obtained from the electronic medical record, as well as from his wife, [name].  She reports that [name] has been in a number of care facilities for his depression.  He was at the [facility] in June and early July of last year.  She says that he came home from that stay with a sore on his sacral area.  He has received home health care; she says they put a silver dressing on it.  It had improved to some extent, but recently it worsened significantly.  She says he also had a fall and landed on his sacrococcygeal area.  In addition to the pressure ulcer, he has bilateral pulmonary infiltrates and numerous small pulmonary cavitary lesions.  He is COVID-19 negative but has gram-positive cocci in his blood cultures.  He is currently requiring vasopressor support.  Past Medical History:  Diagnosis Date  . AKI (acute kidney injury) ([place]) 04/05/2020  . Anxiety   . Cataract   . Depression   . Hyperlipidemia 01/10/2019  . Idiopathic peripheral neuropathy 12/25/2018  . Prostate disease   . Stroke [facility])     Past Surgical History:  Procedure Laterality Date  . COLONOSCOPY WITH PROPOFOL N/A 03/22/2018   Procedure: COLONOSCOPY WITH PROPOFOL;  Surgeon: Jonathon Bellows, MD;  Location: [facility] ENDOSCOPY;  Service: Gastroenterology;  Laterality: N/A;  . HERNIA REPAIR      Family History  Problem Relation Age of Onset  . Depression Father     Social History:  reports that he has quit smoking. He quit smokeless tobacco use about 3 years ago. He reports previous alcohol use. He reports previous drug use.  Allergies: No Known Allergies  Medications: I have reviewed the patient's current medications.  Results for orders placed or performed during the hospital encounter of 03/14/2020 (from the past 48 hour(s))  Blood Culture (routine x 2)     Status: None (Preliminary result)   Collection Time: 03/17/2020  5:43 PM   Specimen: BLOOD  Result Value Ref Range   Specimen Description BLOOD LEFT FORE ARM    Special Requests      BOTTLES DRAWN AEROBIC AND ANAEROBIC Blood Culture adequate volume   Culture  Setup Time      GRAM POSITIVE COCCI IN BOTH AEROBIC AND ANAEROBIC BOTTLES CRITICAL VALUE NOTED.  VALUE IS CONSISTENT WITH PREVIOUSLY REPORTED AND CALLED VALUE. Performed at [facility], [address] [place], [place] [address]    Culture GRAM POSITIVE COCCI    Report Status PENDING   SARS Coronavirus 2 by RT PCR (hospital order, performed in [facility] hospital lab) Nasopharyngeal Nasopharyngeal Swab     Status: None   Collection Time: 03/26/2020  5:43 PM   Specimen: Nasopharyngeal Swab  Result Value Ref Range   SARS Coronavirus  2 NEGATIVE NEGATIVE    Comment: (NOTE) SARS-CoV-2 target nucleic acids are NOT DETECTED.  The SARS-CoV-2 RNA is generally detectable in upper and lower respiratory specimens during the acute phase of infection. The lowest concentration of SARS-CoV-2 viral copies this assay can detect is 250 copies / mL. A negative result does not preclude SARS-CoV-2 infection and should not be used as the sole basis for treatment or other patient management decisions.  A negative result may occur with improper specimen collection /  handling, submission of specimen other than nasopharyngeal swab, presence of viral mutation(s) within the areas targeted by this assay, and inadequate number of viral copies (<250 copies / mL). A negative result must be combined with clinical observations, patient history, and epidemiological information.  Fact Sheet for Patients:   StrictlyIdeas.no  Fact Sheet for Healthcare Providers: BankingDealers.co.za  This test is not yet approved or  cleared by the [place] FDA and has been authorized for detection and/or diagnosis of SARS-CoV-2 by FDA under an Emergency Use Authorization (EUA).  This EUA will remain in effect (meaning this test can be used) for the duration of the COVID-19 declaration under Section 564(b)(1) of the Act, 21 U.S.C. section 360bbb-3(b)(1), unless the authorization is terminated or revoked sooner.  Performed at [facility], [address] [place], [place] [address]   Lactic acid, plasma     Status: Abnormal   Collection Time: 03/16/2020  5:44 PM  Result Value Ref Range   Lactic Acid, Venous 2.7 (HH) 0.5 - 1.9 mmol/L    Comment: CRITICAL RESULT CALLED TO, READ BACK BY AND VERIFIED WITH KELLY WILLIAMS ON 03/19/2020 AT 1831 BY JAG Performed at [facility], [address] [place], [place] [address]   Comprehensive metabolic panel     Status: Abnormal   Collection Time: 03/30/2020  5:44 PM  Result Value Ref Range   Sodium 134 (L) 135 - 145 mmol/L   Potassium 3.4 (L) 3.5 - 5.1 mmol/L   Chloride 97 (L) 98 - 111 mmol/L   CO2 23 22 - 32 mmol/L   Glucose, Bld 115 (H) 70 - 99 mg/dL    Comment: Glucose reference range applies only to samples taken after fasting for at least 8 hours.   BUN 85 (H) 8 - 23 mg/dL   Creatinine, Ser 4.01 (H) 0.61 - 1.24 mg/dL   Calcium 8.4 (L) 8.9 - 10.3 mg/dL   Total Protein 5.9 (L) 6.5 - 8.1 g/dL   Albumin 2.4 (L) 3.5 - 5.0 g/dL   AST 98 (H) 15 - 41 U/L   ALT 20 0  - 44 U/L   Alkaline Phosphatase 94 38 - 126 U/L   Total Bilirubin 1.8 (H) 0.3 - 1.2 mg/dL   GFR calc non Af Amer 15 (L) >60 mL/min   GFR calc Af Amer 17 (L) >60 mL/min   Anion gap 14 5 - 15    Comment: Performed at [facility], [address] [place], [place] [address]  CBC WITH DIFFERENTIAL     Status: Abnormal   Collection Time: 03/18/2020  5:44 PM  Result Value Ref Range   WBC 10.3 4.0 - 10.5 K/uL   RBC 4.04 (L) 4.22 - 5.81 MIL/uL   Hemoglobin 12.2 (L) 13.0 - 17.0 g/dL   HCT 35.3 (L) 39 - 52 %   MCV 87.4 80.0 - 100.0 fL   MCH 30.2 26.0 - 34.0 pg   MCHC 34.6 30.0 - 36.0 g/dL   RDW 14.5  11.5 - 15.5 %   Platelets 84 (L) 150 - 400 K/uL    Comment: REPEATED TO VERIFY PLATELET COUNT CONFIRMED BY SMEAR Immature Platelet Fraction may be clinically indicated, consider ordering this additional test [id number]    nRBC 0.0 0.0 - 0.2 %   Neutrophils Relative % 93 %   Neutro Abs 9.7 (H) 1.7 - 7.7 K/uL   Lymphocytes Relative 3 %   Lymphs Abs 0.3 (L) 0.7 - 4.0 K/uL   Monocytes Relative 2 %   Monocytes Absolute 0.2 0 - 1 K/uL   Eosinophils Relative 0 %   Eosinophils Absolute 0.0 0 - 0 K/uL   Basophils Relative 1 %   Basophils Absolute 0.1 0 - 0 K/uL   WBC Morphology MORPHOLOGY UNREMARKABLE    RBC Morphology MORPHOLOGY UNREMARKABLE    Smear Review PLATELETS APPEAR DECREASED    Immature Granulocytes 1 %   Abs Immature Granulocytes 0.10 (H) 0.00 - 0.07 K/uL    Comment: Performed at [facility], [address] [place], [place] [address]  Protime-INR     Status: None   Collection Time: 04/06/2020  5:44 PM  Result Value Ref Range   Prothrombin Time 13.6 11.4 - 15.2 seconds   INR 1.1 0.8 - 1.2    Comment: (NOTE) INR goal varies based on device and disease states. Performed at [facility], [address] [place], [place] [address]   APTT     Status: None   Collection Time: 03/14/2020  5:44 PM  Result Value Ref Range   aPTT 35 24 - 36 seconds    Comment:  Performed at [facility], [address] [place], [place] [address]  Blood Culture (routine x 2)     Status: None (Preliminary result)   Collection Time: 03/29/2020  5:44 PM   Specimen: BLOOD  Result Value Ref Range   Specimen Description BLOOD RIGHT FORE ARM    Special Requests      BOTTLES DRAWN AEROBIC AND ANAEROBIC Blood Culture adequate volume   Culture  Setup Time      Organism ID to follow GRAM POSITIVE COCCI IN BOTH AEROBIC AND ANAEROBIC BOTTLES CRITICAL RESULT CALLED TO, READ BACK BY AND VERIFIED WITH: DAVID BESANTI AT 0607 03/22/20 [place] Performed at [facility], [address] [place], [place] [address]    Culture GRAM POSITIVE COCCI    Report Status PENDING   Blood Culture ID Panel (Reflexed)     Status: Abnormal   Collection Time: 04/01/2020  5:44 PM  Result Value Ref Range   Enterococcus faecalis NOT DETECTED NOT DETECTED   Enterococcus Faecium NOT DETECTED NOT DETECTED   Listeria monocytogenes NOT DETECTED NOT DETECTED   Staphylococcus species DETECTED (A) NOT DETECTED    Comment: CRITICAL RESULT CALLED TO, READ BACK BY AND VERIFIED WITH:  DAVID BESANTI AT 0607 03/22/20 SDR    Staphylococcus aureus (BCID) DETECTED (A) NOT DETECTED    Comment: Methicillin (oxacillin)-resistant Staphylococcus aureus (MRSA). MRSA is predictably resistant to beta-lactam antibiotics (except ceftaroline). Preferred therapy is vancomycin unless clinically contraindicated. Patient requires contact precautions if  hospitalized. CRITICAL RESULT CALLED TO, READ BACK BY AND VERIFIED WITH:  DAVID BESANTI AT 0607 03/22/20 SDR    Staphylococcus epidermidis NOT DETECTED NOT DETECTED   Staphylococcus lugdunensis NOT DETECTED NOT DETECTED   Streptococcus species NOT DETECTED NOT DETECTED   Streptococcus agalactiae NOT DETECTED NOT DETECTED   Streptococcus pneumoniae NOT DETECTED NOT DETECTED   Streptococcus pyogenes NOT DETECTED NOT DETECTED  A.calcoaceticus-baumannii NOT DETECTED  NOT DETECTED   Bacteroides fragilis NOT DETECTED NOT DETECTED   Enterobacterales NOT DETECTED NOT DETECTED   Enterobacter cloacae complex NOT DETECTED NOT DETECTED   Escherichia coli NOT DETECTED NOT DETECTED   Klebsiella aerogenes NOT DETECTED NOT DETECTED   Klebsiella oxytoca NOT DETECTED NOT DETECTED   Klebsiella pneumoniae NOT DETECTED NOT DETECTED   Proteus species NOT DETECTED NOT DETECTED   Salmonella species NOT DETECTED NOT DETECTED   Serratia marcescens NOT DETECTED NOT DETECTED   Haemophilus influenzae NOT DETECTED NOT DETECTED   Neisseria meningitidis NOT DETECTED NOT DETECTED   Pseudomonas aeruginosa NOT DETECTED NOT DETECTED   Stenotrophomonas maltophilia NOT DETECTED NOT DETECTED   Candida albicans NOT DETECTED NOT DETECTED   Candida auris NOT DETECTED NOT DETECTED   Candida glabrata NOT DETECTED NOT DETECTED   Candida krusei NOT DETECTED NOT DETECTED   Candida parapsilosis NOT DETECTED NOT DETECTED   Candida tropicalis NOT DETECTED NOT DETECTED   Cryptococcus neoformans/gattii NOT DETECTED NOT DETECTED   Meth resistant mecA/C and MREJ DETECTED (A) NOT DETECTED    Comment: CRITICAL RESULT CALLED TO, READ BACK BY AND VERIFIED WITH:  DAVID BESANTI AT 0607 03/22/20 SDR Performed at [facility], [address] [place], [place] [address]   Urinalysis, Complete w Microscopic     Status: Abnormal   Collection Time: 04/04/2020  6:52 PM  Result Value Ref Range   Color, Urine AMBER (A) YELLOW    Comment: BIOCHEMICALS MAY BE AFFECTED BY COLOR   APPearance CLOUDY (A) CLEAR   Specific Gravity, Urine 1.016 1.005 - 1.030   pH 5.0 5.0 - 8.0   Glucose, UA NEGATIVE NEGATIVE mg/dL   Hgb urine dipstick MODERATE (A) NEGATIVE   Bilirubin Urine NEGATIVE NEGATIVE   Ketones, ur NEGATIVE NEGATIVE mg/dL   Protein, ur 30 (A) NEGATIVE mg/dL   Nitrite NEGATIVE NEGATIVE   Leukocytes,Ua NEGATIVE NEGATIVE   RBC / HPF 0-5 0 - 5 RBC/hpf   WBC, UA 6-10 0 - 5 WBC/hpf   Bacteria, UA  RARE (A) NONE SEEN   Squamous Epithelial / LPF 0-5 0 - 5   Hyaline Casts, UA PRESENT    Sperm, UA PRESENT     Comment: Performed at [facility], [address] [place], [place] [address]  Lactic acid, plasma     Status: Abnormal   Collection Time: 03/22/20  1:06 AM  Result Value Ref Range   Lactic Acid, Venous 3.2 (HH) 0.5 - 1.9 mmol/L    Comment: CRITICAL VALUE NOTED. VALUE IS CONSISTENT WITH PREVIOUSLY REPORTED/CALLED VALUE / JAG Performed at [facility], [address] [place], [place] [address]   Strep pneumoniae urinary antigen     Status: None   Collection Time: 03/22/20  3:16 AM  Result Value Ref Range   Strep Pneumo Urinary Antigen NEGATIVE NEGATIVE    Comment:        Infection due to S. pneumoniae cannot be absolutely ruled out since the antigen present may be below the detection limit of the test. Performed at [facility], [address] [place], [place] [address]   Comprehensive metabolic panel     Status: Abnormal   Collection Time: 03/22/20  5:17 AM  Result Value Ref Range   Sodium 136 135 - 145 mmol/L   Potassium 3.3 (L) 3.5 - 5.1 mmol/L   Chloride 101 98 - 111 mmol/L   CO2 19 (L) 22 - 32 mmol/L   Glucose, Bld 179 (  H) 70 - 99 mg/dL    Comment: Glucose reference range applies only to samples taken after fasting for at least 8 hours.   BUN 85 (H) 8 - 23 mg/dL   Creatinine, Ser 3.94 (H) 0.61 - 1.24 mg/dL   Calcium 7.8 (L) 8.9 - 10.3 mg/dL   Total Protein 4.8 (L) 6.5 - 8.1 g/dL   Albumin 1.7 (L) 3.5 - 5.0 g/dL   AST 93 (H) 15 - 41 U/L   ALT 17 0 - 44 U/L   Alkaline Phosphatase 78 38 - 126 U/L   Total Bilirubin 1.8 (H) 0.3 - 1.2 mg/dL   GFR calc non Af Amer 15 (L) >60 mL/min   GFR calc Af Amer 17 (L) >60 mL/min   Anion gap 16 (H) 5 - 15    Comment: Performed at [facility], [address] [place], [place] [address]  CBC     Status: Abnormal   Collection Time: 03/22/20  5:17 AM  Result Value Ref Range   WBC 11.5 (H)  4.0 - 10.5 K/uL   RBC 3.32 (L) 4.22 - 5.81 MIL/uL   Hemoglobin 10.2 (L) 13.0 - 17.0 g/dL   HCT 29.6 (L) 39 - 52 %   MCV 89.2 80.0 - 100.0 fL   MCH 30.7 26.0 - 34.0 pg   MCHC 34.5 30.0 - 36.0 g/dL   RDW 15.0 11.5 - 15.5 %   Platelets 58 (L) 150 - 400 K/uL    Comment: REPEATED TO VERIFY Immature Platelet Fraction may be clinically indicated, consider ordering this additional test [id number] CONSISTENT WITH PREVIOUS RESULT    nRBC 0.0 0.0 - 0.2 %    Comment: Performed at [facility], [address] [place], [place] [address]  Creatinine, urine, random     Status: None   Collection Time: 03/22/20  5:17 AM  Result Value Ref Range   Creatinine, Urine 193 mg/dL    Comment: Performed at [facility], [address] [place], [place] [address]  Na and K (sodium & potassium), rand urine     Status: None   Collection Time: 03/22/20  5:17 AM  Result Value Ref Range   Sodium, Ur <10 mmol/L   Potassium Urine 69 mmol/L    Comment: Performed at [facility], [address] [place], [place] [address]  C-reactive protein     Status: Abnormal   Collection Time: 03/22/20  5:17 AM  Result Value Ref Range   CRP 31.9 (H) <1.0 mg/dL    Comment: Performed at [facility], [address], [place], [place] [address]  ESR     Status: None   Collection Time: 03/22/20  5:17 AM  Result Value Ref Range   Sed Rate 2 0 - 20 mm/hr    Comment: Performed at [facility], [address] [place], [place] [address]  Prealbumin     Status: Abnormal   Collection Time: 03/22/20  5:17 AM  Result Value Ref Range   Prealbumin <5 (L) 18 - 38 mg/dL    Comment: Performed at [facility], [place] [address] [place], [place] [address]  Lactic acid, plasma     Status: Abnormal   Collection Time: 03/22/20  5:17 AM  Result Value Ref Range   Lactic Acid, Venous 4.3 (HH) 0.5 - 1.9 mmol/L    Comment: CRITICAL VALUE NOTED. VALUE IS CONSISTENT WITH PREVIOUSLY REPORTED/CALLED  VALUE / JAG Performed at [facility], [address] [place], [place] [address]   MRSA PCR Screening     Status: Abnormal   Collection Time: 03/22/20  8:30 AM   Specimen: Nasopharyngeal  Result Value Ref Range   MRSA by PCR POSITIVE (A) NEGATIVE    Comment:        The GeneXpert MRSA Assay (FDA approved for NASAL specimens only), is one component of a comprehensive MRSA colonization surveillance program. It is not intended to diagnose MRSA infection nor to guide or monitor treatment for MRSA infections. RESULT CALLED TO, READ BACK BY AND VERIFIED WITH:  [facility] WHITE AT 1007 03/22/20 SDR Performed at [facility], [address] [place], [place] [address]     CT ABDOMEN PELVIS WO CONTRAST  Result Date: 04/04/2020 CLINICAL DATA:  Abdominal pain and decreased appetite x2 days. EXAM: CT ABDOMEN AND PELVIS WITHOUT CONTRAST TECHNIQUE: Multidetector CT imaging of the abdomen and pelvis was performed following the standard protocol without IV contrast. COMPARISON:  Lumbar spine MRI, dated January 08, 2018. FINDINGS: Lower chest: Numerous small cavitary lesions, with mild to moderately thickened surrounding walls, are seen scattered throughout the bilateral lung bases. A 1.2 cm noncalcified lung nodule is seen within the right lung base (axial CT image 10, CT series number 3). Mild to moderate severity areas of atelectasis and/or infiltrate are noted along the posterior aspects of the bilateral lung bases. Very small bilateral pleural effusions are seen. A very small predominantly anterior pericardial effusion is noted. Hepatobiliary: No focal liver abnormality is seen. No gallstones, gallbladder wall thickening, or biliary dilatation. Pancreas: Unremarkable. No pancreatic ductal dilatation or surrounding inflammatory changes. Spleen: Normal in size without focal abnormality. Adrenals/Urinary Tract: The right adrenal gland is normal in size and appearance. A 3.4 cm x 1.3 cm  low-attenuation left adrenal mass is seen (axial CT image 33, CT series number 2). Kidneys are normal, without renal calculi, focal lesion, or hydronephrosis. Bladder is unremarkable. Stomach/Bowel: Stomach is within normal limits. Appendix appears normal. No evidence of bowel dilatation. Noninflamed diverticula are seen within the descending and sigmoid colon. Vascular/Lymphatic: There is mild to moderate severity calcification of the abdominal aorta and bilateral common iliac arteries, without evidence of aneurysmal dilatation. No enlarged abdominal or pelvic lymph nodes. Reproductive: There is mild to moderate severity enlargement of the prostate gland. Other: Multiple small surgical coils are seen along the anterior pelvic wall. No abdominopelvic ascites. Musculoskeletal: A compression fracture deformity of indeterminate age is seen at the level of L1. The this is not seen on the prior lumbar spine MRI, dated January 08, 2018. No retropulsion of fracture fragments is identified. Mild-to-moderate severity multilevel degenerative changes seen throughout the lumbar spine IMPRESSION: 1. Numerous small cavitary lesions, with mild to moderately thickened surrounding walls, scattered throughout the bilateral lung bases. While this may be secondary to an atypical infectious process, sequelae associated with septic emboli cannot be excluded. 2. 1.2 cm noncalcified lung nodule within the right lung base. Correlation with follow-up chest CT is recommended to determine the presence or absence of additional noncalcified lung nodules. Additional follow-up with a nuclear medicine PET/CT should be considered. 3. Very small bilateral pleural effusions. 4. Colonic diverticulosis. 5. Compression fracture deformity of indeterminate age at the level of L1. The this is not seen on the prior lumbar spine MRI, dated January 08, 2018. Correlation with point tenderness is recommended. Additional MRI follow-up should be considered. 6. Mild to  moderate severity enlargement of the prostate gland. 7. Aortic atherosclerosis. Aortic Atherosclerosis (ICD10-I70.0). Electronically Signed  By: Virgina Norfolk M.D.   On: 03/13/2020 20:19    CT CHEST WO CONTRAST  Result Date: 03/22/2020 CLINICAL DATA:  Decreased appetite for 2 days. Intermittent fever for 1 week. Cavitary lesion seen in the lung bases on CT abdomen and pelvis. Completion imaging of the chest. EXAM: CT CHEST WITHOUT CONTRAST TECHNIQUE: Multidetector CT imaging of the chest was performed following the standard protocol without IV contrast. COMPARISON:  CT abdomen and pelvis 04/06/2020.  Chest 03/24/2020 FINDINGS: Cardiovascular: Normal heart size. No pericardial effusions. Dilated ascending thoracic aorta measuring 4.1 cm diameter. Scattered aortic calcification. Mediastinum/Nodes: Scattered mediastinal lymph nodes are not pathologically enlarged. Esophagus is decompressed. Visualized thyroid gland is unremarkable. Mucoid material demonstrated in the trachea. Lungs/Pleura: Both lungs demonstrate multiple nodular and confluent areas of consolidation. Many of the nodular areas demonstrate central cavitation. Changes are diffusely scattered throughout the lungs. There is more prominent atelectasis or consolidation in the lung bases and in the right upper lung. Small bilateral pleural effusions. Differential diagnosis would include either septic emboli or atypical infectious inflammatory process such as TB or fungal infection. Cavitary metastatic disease would also be possible. Upper Abdomen: No acute process demonstrated in the visualized upper abdomen. Musculoskeletal: No destructive bone lesions. IMPRESSION: 1. Both lungs demonstrate multiple nodular and confluent areas of consolidation. Most of the nodular areas demonstrate central cavitation. Differential diagnosis would include either septic emboli or atypical infectious or inflammatory process such as TB or fungal infection. Cavitary  metastatic disease would also be possible. Pulmonology referral is suggested. 2. Small bilateral pleural effusions. 3. Dilated ascending thoracic aorta measuring 4.1 cm diameter. 4. Aortic atherosclerosis. Aortic Atherosclerosis (ICD10-I70.0). Electronically Signed   By: Lucienne Capers M.D.   On: 03/22/2020 04:03   US RENAL  Result Date: 03/22/2020 CLINICAL DATA:  Acute kidney injury EXAM: RENAL / URINARY TRACT ULTRASOUND COMPLETE COMPARISON:  CT abdomen and pelvis 04/05/2020 FINDINGS: Right Kidney: Renal measurements: 11.1 x 5.0 x 5.9 cm = volume: 170 mL. Cortical thinning. Slightly increased cortical echogenicity. No mass, hydronephrosis, or shadowing calcification. Left Kidney: Renal measurements: 12.4 x 5.2 x 5.5 cm = volume: 185 mL. Cortical thinning. Normal cortical echogenicity. No mass, hydronephrosis, or shadowing calcification. Bladder: Decompressed, unable to adequately evaluate Other: N/A IMPRESSION: Question medical renal disease changes noted at RIGHT kidney. No evidence of renal mass or hydronephrosis. Electronically Signed   By: Lavonia Dana M.D.   On: 03/22/2020 10:13   DG Chest Port 1 View  Result Date: 03/29/2020 CLINICAL DATA:  Sepsis fever, decreased appetite, dark foul-smelling urine, Parkinson's disease, former smoker EXAM: PORTABLE CHEST 1 VIEW COMPARISON:  Portable exam 1753 hours compared to 09/30/2019 FINDINGS: Normal heart size, mediastinal contours, and pulmonary vascularity. Patchy airspace infiltrates bilaterally consistent with multifocal pneumonia. No pleural effusion or pneumothorax. Osseous structures unremarkable. IMPRESSION: Patchy BILATERAL pulmonary infiltrates consistent with multifocal pneumonia; cannot exclude COVID-19 with this appearance. Electronically Signed   By: Lavonia Dana M.D.   On: 03/26/2020 18:12    Review of Systems  Unable to perform ROS: Mental status change   Blood pressure (!) 97/53, pulse 100, temperature 98.8 F (37.1 C), temperature source  Oral, resp. rate (!) 30, height '5\' 11"'  (1.803 m), weight 71 kg, SpO2 95 %. Physical Exam Vitals reviewed.  Constitutional:      Comments: He is awake and opens his eyes to voice, but does not give much in the way of verbal response.  HENT:     Head: Normocephalic and atraumatic.  Nose: Nose normal.     Mouth/Throat:     Comments: Mucous membranes tacky Eyes:     General: No scleral icterus.       Right eye: No discharge.        Left eye: No discharge.  Cardiovascular:     Rate and Rhythm: Regular rhythm. Tachycardia present.  Pulmonary:     Effort: Pulmonary effort is normal. No respiratory distress.  Abdominal:     Palpations: Abdomen is soft.     Tenderness: There is no abdominal tenderness.  Genitourinary:    Comments: Deferred Musculoskeletal:     Right lower leg: No edema.     Left lower leg: No edema.  Skin:    Comments: Foul-smelling sacral pressure wound.    Neurological:     Comments: Unsure of baseline.  Psychiatric:     Comments: Flat affect, poorly responsive to questioning.     Assessment/Plan: This is a 64 year old man with multiple medical comorbidities who was admitted to the ICU for sepsis.  He has an unstageable sacral pressure wound which is in need of debridement.  I discussed this with his wife, [name].  I let her know that I planned to take him to the operating room tomorrow for examination under anesthesia, debridement, possible bone biopsy, and potential wound VAC placement.  I discussed the risks of the procedure with her which include, but are not limited to, bleeding, infection, need for long-term antibiotics, need for additional surgical procedures, failure of wound to heal.  She is interested in having [place] proceed with the surgery.  I will work on getting him scheduled, pending OR and anesthesia availability.  He should be made n.p.o. after midnight.  Fredirick Maudlin 03/22/2020, 11:14 AM
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
## 2020-03-22 NOTE — H&P (Addendum)
PULMONARY / CRITICAL CARE MEDICINE HISTORY & PHYSICAL  Name: [name] MRN: [id number] DOB: 1956-03-06    LOS: 1  Admitting Provider:  Samuella Cota. Reinaldo Berber, MD Reason for Referral:  Septic shock   Brief patient description:  [name] is a 64 year old male with history of Parkinson's disease, previous stroke, hyperlipidemia, BPH, malnutrition, vitamin D deficiency, severe depression, s/p ECT treatment (2019) and anxiety who is being admitted with working diagnosis of multifocal pneumonia with cavitary lesions, concern for atypical infection, rule out TB, septic shock requiring low-dose vasopressors and severe AKI.    HPI:  [name] is a 64 year old male with history of Parkinson's disease, previous stroke, hyperlipidemia, BPH, malnutrition, vitamin D deficiency, severe depression, s/p ECT treatment (2019) and anxiety who presents with weakness, malaise and loss of appetite x 3 days.    The patient is a poor historian.  There are no family bedside.  Apparently, he lives with his wife.  I called Mrs. Rita Klecka 2 different numbers but no answer, left message.  The patient was brought to the ED by EMS due to reported intermittent fevers x 1 week, decreased appetite x 2 days, dark foul smelling and reduced urine noted by wife.  The patient apparently has sacral wounds for the past 6 months, and had been receiving wound care by home health.  He was noted to have stage IV sacral decubitus ulcers with purulent drainage noted by EMS.   The patient denies any chest pain, abdominal pain or SOB.  He was noted to be coughing during exam.  Workup in the ED reveals WBC of 10.3, lactic acid 2.7, Cr 4.01 (normal baseline 0.97), bicarb 23, K 3.4 and neg COVID swab.  UA shows negative nitrite, neg leukocytes, 6-10 WBC, rare bacteria.  CXR shows patchy bilateral pulmonary infiltrates consistent with multifocal pneumonia.  CT abd/pelvis was significant for numerous small cavitary lesions,  with mild to moderately thickened surrounding walls, scattered throughout the bilateral lung bases.   1.2 cm noncalcified lung nodule within the right lung base.  In the ED, he received 1.5 L of NS IVF, Vancomycin, Ceftriaxone and Azithromycin.  Most recent MAP is 70 (BP 86/60).  He was just started on Levophed 52mg.    Past Medical History:  Diagnosis Date  . AKI (acute kidney injury) ([place] 04/02/2020  . Anxiety   . Cataract   . Depression   . Hyperlipidemia 01/10/2019  . Idiopathic peripheral neuropathy 12/25/2018  . Prostate disease   . Stroke ([facility]    Past Surgical History:  Procedure Laterality Date  . COLONOSCOPY WITH PROPOFOL N/A 03/22/2018   Procedure: COLONOSCOPY WITH PROPOFOL;  Surgeon: AJonathon Bellows MD;  [facility];  Service: Gastroenterology;  Laterality: N/A;  . HERNIA REPAIR     No current facility-administered medications on file prior to encounter.   Current Outpatient Medications on File Prior to Encounter  Medication Sig  . carbidopa-levodopa (SINEMET IR) 25-100 MG tablet Take 1 tablet by mouth 3 (three) times daily. Crush and put in food.  . Cholecalciferol (VITAMIN D3) 1.25 MG (50000 UT) CAPS Take 50,000 Units by mouth every Monday.   .Marland KitchenPRESCRIPTION MEDICATION Place 1 patch onto the skin daily as needed (wound care). Silvercel patches    Allergies No Known Allergies  Family History Family History  Problem Relation Age of Onset  . Depression Father    Social History  reports that he has quit smoking. He quit smokeless  tobacco use about 3 years ago. He reports previous alcohol use. He reports previous drug use.  Review Of Systems:  All systems reviewed, and except as noted under HPI, were negative.   VITAL SIGNS: BP (!) 83/54   Pulse 97   Temp 98 F (36.7 C) (Oral)   Resp (!) 30   Wt 68 kg   SpO2 92%   BMI 23.48 kg/m   HEMODYNAMICS: MAP currently 65    VENTILATOR SETTINGS:  Not applicable; on room air     INTAKE / OUTPUT: No  intake/output data recorded.  PHYSICAL EXAMINATION: General:  Acutely ill appearing elderly male who appears older than stated age, fairly poor historian, appears somewhat lethargic HEENT:  NCAT, sclera anicteric, dry MM, neck supple Neuro:  Generalized weakness, verbal Cardiovascular:  S1S2 present, RRR Lungs:  Non-labored, mild bilateral rales; no wheezing Abdomen:  Soft, non-distended, non-tender, no guarding, condom foley in place Musculoskeletal:  No LE edema, cyanosis or clubbing Skin:  Warm and dry; ++large area of unstageable sacral decubitus ulcers with eschar and necrosis -- foul smelling and some purulent discharge    LABS:  BMET Recent Labs  Lab 03/29/2020 1744  NA 134*  K 3.4*  CL 97*  CO2 23  BUN 85*  CREATININE 4.01*  GLUCOSE 115*    Electrolytes Recent Labs  Lab 03/20/2020 1744  CALCIUM 8.4*    CBC Recent Labs  Lab 03/30/2020 1744  WBC 10.3  HGB 12.2*  HCT 35.3*  PLT 84*    Coag's Recent Labs  Lab 03/27/2020 1744  APTT 35  INR 1.1    Sepsis Markers Recent Labs  Lab 03/19/2020 1744  LATICACIDVEN 2.7*    ABG No results for input(s): PHART, PCO2ART, PO2ART in the last 168 hours.  Liver Enzymes Recent Labs  Lab 04/07/2020 1744  AST 98*  ALT 20  ALKPHOS 94  BILITOT 1.8*  ALBUMIN 2.4*    Cardiac Enzymes No results for input(s): TROPONINI, PROBNP in the last 168 hours.  Glucose No results for input(s): GLUCAP in the last 168 hours.  Imaging CT ABDOMEN PELVIS WO CONTRAST Result Date: 03/17/2020 IMPRESSION: 1. Numerous small cavitary lesions, with mild to moderately thickened surrounding walls, scattered throughout the bilateral lung bases. While this may be secondary to an atypical infectious process, sequelae associated with septic emboli cannot be excluded. 2. 1.2 cm noncalcified lung nodule within the right lung base. Correlation with follow-up chest CT is recommended to determine the presence or absence of additional noncalcified lung  nodules. Additional follow-up with a nuclear medicine PET/CT should be considered. 3. Very small bilateral pleural effusions. 4. Colonic diverticulosis. 5. Compression fracture deformity of indeterminate age at the level of L1. The this is not seen on the prior lumbar spine MRI, dated January 08, 2018. Correlation with point tenderness is recommended. Additional MRI follow-up should be considered. 6. Mild to moderate severity enlargement of the prostate gland. 7. Aortic atherosclerosis. Aortic Atherosclerosis  DG Chest Port 1 View Result Date: 04/06/2020 IMPRESSION: Patchy BILATERAL pulmonary infiltrates consistent with multifocal pneumonia; cannot exclude COVID-19 with this appearance.     CULTURES: Blood cultures:  Pending Urine cultures:  Pending    ANTIBIOTICS: Vancomycin Ceftriaxone Azithromycin   LINES/TUBES:  Peripheral IV x 2 left forearm    ASSESSMENT  and PLAN:  Mr. ANDRIK SANDT is a 64 year old male with history of Parkinson's disease, previous stroke, hyperlipidemia, BPH, malnutrition, vitamin D deficiency, severe depression, s/p ECT treatment (2019) and anxiety who  is being admitted with working diagnosis of multifocal pneumonia with cavitary lesions, concern for atypical infection, rule out TB, septic shock requiring low-dose vasopressors and severe AKI.    Septic shock due to: Multi-focal pneumonia Pulmonary cavitary lesions, concern for atypical infection, r/o TB Sacral decubitus ulcers with soft tissue infection CXR shows patchy bilateral pulmonary infiltrates consistent with multifocal pneumonia.  COVID negative.  CT abd/pelvis was significant for numerous small cavitary lesions, with mild to moderately thickened surrounding walls, scattered throughout the bilateral lung bases.  Differential includes septic emboli, TB, NTM, fungal (ie aspergillus, histoplasmosis, crypto, blasto, coccid), GPA, necrotizing pneumonia, nocardia, actinomyces, unlikely malignancy.  Place in  airborne negative pressure room to rule out TB  Would cover with Vancomycin, Zosyn (lung infection and decub ulcers which is most likely polymicrobial including anaerobic), and Azithromycin to cover for atypical  AFB smears x 3, at least 8 hours apart, and at least one early morning collection (ordered)  Sputum for gram stain, C&S (ordered)  Serum quantiferon (ordered)  Urine legionella, strept, histo and blasto  Give another liter bolus LR at this time (informed ED nurse)  Obtain CT chest w/o contrast for whole lung view cavitary lesions seen in the bases on CT abd (ordered)  Levophed to maintain MAP > 65, may need central line if requires dose of Levophed > 10-15 mcg  Check CRP and ESR  Echocardiogram due to multiple cavitary lesions to screen for endocarditis  Surgical consult in AM -- possible need for debridement  AKI Likely pre-renal due to poor oral intake, but in setting of sepsis with shock, could be a component of intrinsic with ATN.  Cr 4.01 (normal baseline 0.97), bicarb 23, K 3.4.  UA shows negative nitrite, neg leukocytes, 6-10 WBC, rare bacteria.   No emergent indication for dialysis at this time  Check bladder scan, if any retention, place foley  Renal US  CMP in AM  Avoid nephrotoxic agents  Consider renal consult in AM  Urine electrolytes, Cr  Renally dose medications, pharmacy consult for Vanc dosing   IV fluids, monitor I/Os  Acute encephalopathy Likely multifactorial including metabolic/renal with AKI and infection, hypotension  Correct underlying illness and organ dysfunction  Avoid or minimize sedatives  Noncalcified lung nodule 1.2cm, right lung base Incidental finding on abd/pelvis CT, no old CT imaging for comparison.  He has brief 3 year smoking history in the past.   Further assessment on CT chest  Based on CT chest result, can arrange outpatient follow-up with repeat CT imaging vs PET/CT vs biopsy  Parkinson's disease:  PTA  carbidopa/levodopa Previous stroke:  Stable  Malnutrition:  Nutrition consult, check prealbumin Severe depression, s/p ECT treatment (2019) and anxiety: stable       Best Practice: Code Status: Full Diet: Renal  GI prophylaxis: not indicated VTE prophylaxis:  Heparin SQ  FAMILY  - Updates: called wife [name] Balestrieri x 2, left message on voicemail to return call  ___________________ Samuella Cota. Reinaldo Berber, MD Pulmonary and [place]     03/22/2020, 1:16 AM

## 2020-03-22 NOTE — Progress Notes (Signed)
Pharmacy Antibiotic Note  [name] is a 64 y.o. male admitted on 03/22/2020 with pneumonia/staph aureus bacteremia/ sacral decubitus.  Pharmacy has been consulted for vanc/unasyn dosing. ID is following.  Plan: Will d/c zosyn and change to unasyn:  Unasyn 3g q12h   Per Vancomycin: 09/12 renal function not improving so will check a 24 hour random vanc level and re-assess need for dosing.   Goal random < 20 mcg/mL.   Height: 5\' 11"  (180.3 cm) Weight: 71 kg (156 lb 8.4 oz) IBW/kg (Calculated) : 75.3  Temp (24hrs), Avg:98.4 F (36.9 C), Min:97.9 F (36.6 C), Max:99.1 F (37.3 C)  Recent Labs  Lab 03/22/2020 1744 03/22/20 0106 03/22/20 0517  WBC 10.3  --  11.5*  CREATININE 4.01*  --  3.94*  LATICACIDVEN 2.7* 3.2* 4.3*    Estimated Creatinine Clearance: 18.8 mL/min (A) (by C-G formula based on SCr of 3.94 mg/dL (H)).    No Known Allergies  9/12 MRSA PCR + 9/12 BCx MRSA 4/4 bottles  Thank you for allowing pharmacy to be a part of this patient's care.  Lu Duffel, PharmD, BCPS Clinical Pharmacist 03/22/2020 1:13 PM

## 2020-03-22 NOTE — Progress Notes (Signed)
Patient has unstageable sacral decubitus which is foul-smelling with purulent discharge.  He will need surgical evaluation for debridement.  See associated scans photographs of the decubitus ulcer.  Present on admission.  Renold Don, MD [facility]

## 2020-03-22 NOTE — Progress Notes (Addendum)
ID Admitted from home with intermittent fever MRSA bacteremia Multiple cavitary lesions lungs- likely due to MRSA- this is not TB  Also has  sacral decubitus AKI Parkinsons disease CVA No hardware  Need to r/o endocarditis Currently on vanco/zosyn and azithromycin Will avoid vanco/zosyn combo DC zosyn and azithromycin Can give unasyn for the sacral decub until it is debrided No need for airborne precaution Discussed with intensivist

## 2020-03-22 NOTE — ED Notes (Signed)
Pt's lactic increased from 3.2 to 4.3. This RN messaged admitting MD. No new orders.

## 2020-03-22 NOTE — Progress Notes (Signed)
PHARMACY - PHYSICIAN COMMUNICATION CRITICAL VALUE ALERT - BLOOD CULTURE IDENTIFICATION (BCID)  [name] is an 64 y.o. male who presented to [facility] on [facility]17/2020 with a chief complaint of SOB   Assessment:  Tachycardia, hypotensive systolic 54'T, LA 2.7 >> 3.2 >> 4.3, WBC 11.5 >> 10.3, plts 84 >> 58. CXR IMPRESSION: Patchy BILATERAL pulmonary infiltrates consistent with multifocal pneumonia; cannot exclude COVID-19 with this appearance.   4/4 GPC Staph Aureus Mec A/C, MREJ positive.  Name of physician (or Provider) Contacted: Sharion Settler  Current antibiotics: Vanc, zosyn, azithromycin  Changes to prescribed antibiotics recommended:  Patient is on recommended antibiotics -- recommending to discontinue azithromycin since MRSA+, provider would like to continue zosyn since patient in septic shock.  Results for orders placed or performed during the hospital encounter of 03/20/2020  Blood Culture ID Panel (Reflexed) (Collected: 03/15/2020  5:44 PM)  Result Value Ref Range   Enterococcus faecalis NOT DETECTED NOT DETECTED   Enterococcus Faecium NOT DETECTED NOT DETECTED   Listeria monocytogenes NOT DETECTED NOT DETECTED   Staphylococcus species DETECTED (A) NOT DETECTED   Staphylococcus aureus (BCID) DETECTED (A) NOT DETECTED   Staphylococcus epidermidis NOT DETECTED NOT DETECTED   Staphylococcus lugdunensis NOT DETECTED NOT DETECTED   Streptococcus species NOT DETECTED NOT DETECTED   Streptococcus agalactiae NOT DETECTED NOT DETECTED   Streptococcus pneumoniae NOT DETECTED NOT DETECTED   Streptococcus pyogenes NOT DETECTED NOT DETECTED   A.calcoaceticus-baumannii NOT DETECTED NOT DETECTED   Bacteroides fragilis NOT DETECTED NOT DETECTED   Enterobacterales NOT DETECTED NOT DETECTED   Enterobacter cloacae complex NOT DETECTED NOT DETECTED   Escherichia coli NOT DETECTED NOT DETECTED   Klebsiella aerogenes NOT DETECTED NOT DETECTED   Klebsiella oxytoca NOT DETECTED NOT DETECTED    Klebsiella pneumoniae NOT DETECTED NOT DETECTED   Proteus species NOT DETECTED NOT DETECTED   Salmonella species NOT DETECTED NOT DETECTED   Serratia marcescens NOT DETECTED NOT DETECTED   Haemophilus influenzae NOT DETECTED NOT DETECTED   Neisseria meningitidis NOT DETECTED NOT DETECTED   Pseudomonas aeruginosa NOT DETECTED NOT DETECTED   Stenotrophomonas maltophilia NOT DETECTED NOT DETECTED   Candida albicans NOT DETECTED NOT DETECTED   Candida auris NOT DETECTED NOT DETECTED   Candida glabrata NOT DETECTED NOT DETECTED   Candida krusei NOT DETECTED NOT DETECTED   Candida parapsilosis NOT DETECTED NOT DETECTED   Candida tropicalis NOT DETECTED NOT DETECTED   Cryptococcus neoformans/gattii NOT DETECTED NOT DETECTED   Meth resistant mecA/C and MREJ DETECTED (A) NOT DETECTED   Tobie Lords, PharmD, BCPS Clinical Pharmacist 03/22/2020  6:52 AM

## 2020-03-22 NOTE — Consult Note (Signed)
WOC Nurse Consult Note: Reason for Consult:Unstageable pressure injury to sacrum, likely infectious. EMS noted purulent drainage and foul odor from wound during transport. Wound type:Pressure, infectious Pressure Injury POA: Yes  Surgery is simultaneously consulted for this severe pressure injury; Dr. Bearl Mulberry has seen earlier today.  Plan is to take patient to OR tomorrow for examination under anesthesia, debridement, possible bone biopsy and potential negative pressure wound therapy placement. There are photos on the record from the surgical MD visit.  Dr. Delaine Lame of Infectious Disease is also consulted and has seen the patient today. He is on systemic antibiotics.  It is noted that the patient is currently critically ill.  [facility] nursing team will not follow, but will remain available to this patient, the nursing, surgical and medical teams.  Please re-consult if needed. Thanks, Maudie Flakes, MSN, RN, [place], Arther Abbott  Pager# [phone]

## 2020-03-22 NOTE — Progress Notes (Signed)
Pharmacy Antibiotic Note  [name] is a 64 y.o. male admitted on 03/17/2020 with pneumonia.  Pharmacy has been consulted for vanc/cefepime dosing.  Plan: Patient received vanc 1.75g IV load and cefepime 2g IV x 1 in ED  Will continue cefepime 2g IV q24h per CrCl 11 - 29 ml/min and will continue to monitor renal function.  09/12 renal function not improving so will check a 24 hour random vanc level and re-assess need for dosing.   Goal random < 20 mcg/mL.   Weight: 68 kg (149 lb 14.6 oz)  Temp (24hrs), Avg:98.1 F (36.7 C), Min:98 F (36.7 C), Max:98.2 F (36.8 C)  Recent Labs  Lab 03/24/2020 1744 03/22/20 0106 03/22/20 0517  WBC 10.3  --  11.5*  CREATININE 4.01*  --  3.94*  LATICACIDVEN 2.7* 3.2* 4.3*    Estimated Creatinine Clearance: 17.5 mL/min (A) (by C-G formula based on SCr of 3.94 mg/dL (H)).    No Known Allergies   Thank you for allowing pharmacy to be a part of this patient's care.  Tobie Lords, PharmD, BCPS Clinical Pharmacist 03/22/2020 7:12 AM

## 2020-03-22 NOTE — ED Notes (Addendum)
D5 in LR stopped per Dr. Reinaldo Berber

## 2020-03-22 NOTE — Plan of Care (Signed)

## 2020-03-22 NOTE — Progress Notes (Signed)
Follow up - Critical Care Medicine Note  Patient Details:    [name] is an 64 y.o. male  with history of Parkinson's disease, previous stroke, hyperlipidemia, BPH, malnutrition, vitamin D deficiency, severe depression, s/p ECT treatment (2019) and anxiety who is being admitted with working diagnosis of multifocal pneumonia with cavitary lesions, concern for atypical infection, rule out TB, septic shock requiring low-dose vasopressors and severe AKI.  Lines, Airways, Drains: Airway (Active)     Airway (Active)     Urethral Catheter Waldo;Latex 14 Fr. (Active)  Indication for Insertion or Continuance of Catheter Healing of stage 3 or 4 pressure injuries AND incontinence 03/22/20 1600  Site Assessment Intact;Clean;Dry 03/22/20 1600  Catheter Maintenance Bag below level of bladder 03/22/20 1600  Collection Container Standard drainage bag 03/22/20 1600  Securement Method Securing device (Describe) 03/22/20 1600  Urinary Catheter Interventions (if applicable) Unclamped 40/98/11 1600  Output (mL) 40 mL 03/22/20 1800    Anti-infectives:  Anti-infectives (From admission, onward)   Start     Dose/Rate Route Frequency Ordered Stop   03/22/20 2300  azithromycin (ZITHROMAX) 500 mg in sodium chloride 0.9 % 250 mL IVPB  Status:  Discontinued        500 mg 250 mL/hr over 60 Minutes Intravenous Every 24 hours 03/22/20 0318 03/22/20 1300   03/22/20 2200  Ampicillin-Sulbactam (UNASYN) 3 g in sodium chloride 0.9 % 100 mL IVPB        3 g 200 mL/hr over 30 Minutes Intravenous Every 12 hours 03/22/20 1316     03/22/20 1900  ceFEPIme (MAXIPIME) 2 g in sodium chloride 0.9 % 100 mL IVPB  Status:  Discontinued        2 g 200 mL/hr over 30 Minutes Intravenous Every 24 hours 04/08/2020 2227 03/22/20 0332   03/22/20 0600  piperacillin-tazobactam (ZOSYN) IVPB 3.375 g  Status:  Discontinued        3.375 g 100 mL/hr over 30 Minutes Intravenous Every 8 hours 03/22/20 0318 03/22/20 0331    03/22/20 0345  piperacillin-tazobactam (ZOSYN) IVPB 3.375 g  Status:  Discontinued        3.375 g 12.5 mL/hr over 240 Minutes Intravenous Every 12 hours 03/22/20 0332 03/22/20 1300   03/29/2020 2245  vancomycin (VANCOREADY) IVPB 750 mg/150 mL        750 mg 150 mL/hr over 60 Minutes Intravenous  Once 03/17/2020 2231     03/11/2020 2235  vancomycin variable dose per unstable renal function (pharmacist dosing)         Does not apply See admin instructions 03/16/2020 2235     04/02/2020 2045  azithromycin (ZITHROMAX) 500 mg in sodium chloride 0.9 % 250 mL IVPB        500 mg 250 mL/hr over 60 Minutes Intravenous  Once 04/06/2020 2040 03/22/20 0049   03/25/2020 1745  vancomycin (VANCOCIN) IVPB 1000 mg/200 mL premix        1,000 mg 200 mL/hr over 60 Minutes Intravenous  Once 03/25/2020 1737 03/14/2020 2145   03/20/2020 1745  cefTRIAXone (ROCEPHIN) 2 g in sodium chloride 0.9 % 100 mL IVPB        2 g 200 mL/hr over 30 Minutes Intravenous  Once 04/05/2020 1737 03/14/2020 1945      Microbiology: Results for orders placed or performed during the hospital encounter of 03/19/2020  Blood Culture (routine x 2)     Status: None (Preliminary result)   Collection Time: 03/23/2020  5:43 PM   Specimen: BLOOD  Result Value Ref Range Status   Specimen Description BLOOD LEFT FORE ARM  Final   Special Requests   Final    BOTTLES DRAWN AEROBIC AND ANAEROBIC Blood Culture adequate volume   Culture  Setup Time   Final    GRAM POSITIVE COCCI IN BOTH AEROBIC AND ANAEROBIC BOTTLES CRITICAL VALUE NOTED.  VALUE IS CONSISTENT WITH PREVIOUSLY REPORTED AND CALLED VALUE. Performed at [facility], [address] [place], [place] [address]    Culture [facility] POSITIVE COCCI  Final   Report Status PENDING  Incomplete  SARS Coronavirus 2 by RT PCR (hospital order, performed in [facility] hospital lab) Nasopharyngeal Nasopharyngeal Swab     Status: None   Collection Time: 03/15/2020  5:43 PM   Specimen: Nasopharyngeal Swab  Result Value  Ref Range Status   SARS Coronavirus 2 NEGATIVE NEGATIVE Final    Comment: (NOTE) SARS-CoV-2 target nucleic acids are NOT DETECTED.  The SARS-CoV-2 RNA is generally detectable in upper and lower respiratory specimens during the acute phase of infection. The lowest concentration of SARS-CoV-2 viral copies this assay can detect is 250 copies / mL. A negative result does not preclude SARS-CoV-2 infection and should not be used as the sole basis for treatment or other patient management decisions.  A negative result may occur with improper specimen collection / handling, submission of specimen other than nasopharyngeal swab, presence of viral mutation(s) within the areas targeted by this assay, and inadequate number of viral copies (<250 copies / mL). A negative result must be combined with clinical observations, patient history, and epidemiological information.  Fact Sheet for Patients:   StrictlyIdeas.no  Fact Sheet for Healthcare Providers: BankingDealers.co.za  This test is not yet approved or  cleared by the [place] FDA and has been authorized for detection and/or diagnosis of SARS-CoV-2 by FDA under an Emergency Use Authorization (EUA).  This EUA will remain in effect (meaning this test can be used) for the duration of the COVID-19 declaration under Section 564(b)(1) of the Act, 21 U.S.C. section 360bbb-3(b)(1), unless the authorization is terminated or revoked sooner.  Performed at [facility], [address] [place], [place] [address]   Blood Culture (routine x 2)     Status: None (Preliminary result)   Collection Time: 03/12/2020  5:44 PM   Specimen: BLOOD  Result Value Ref Range Status   Specimen Description BLOOD RIGHT FORE ARM  Final   Special Requests   Final    BOTTLES DRAWN AEROBIC AND ANAEROBIC Blood Culture adequate volume   Culture  Setup Time   Final    Organism ID to follow GRAM POSITIVE COCCI IN  BOTH AEROBIC AND ANAEROBIC BOTTLES CRITICAL RESULT CALLED TO, READ BACK BY AND VERIFIED WITH: DAVID BESANTI AT 0607 03/22/20 [place] Performed at [facility], [address] [place], [place] [address]    Culture [facility] POSITIVE COCCI  Final   Report Status PENDING  Incomplete  Blood Culture ID Panel (Reflexed)     Status: Abnormal   Collection Time: 03/12/2020  5:44 PM  Result Value Ref Range Status   Enterococcus faecalis NOT DETECTED NOT DETECTED Final   Enterococcus Faecium NOT DETECTED NOT DETECTED Final   Listeria monocytogenes NOT DETECTED NOT DETECTED Final   Staphylococcus species DETECTED (A) NOT DETECTED Final    Comment: CRITICAL RESULT CALLED TO, READ BACK BY AND VERIFIED WITH:  DAVID BESANTI AT 0607 03/22/20 SDR    Staphylococcus aureus (BCID) DETECTED (A) NOT DETECTED Final  Comment: Methicillin (oxacillin)-resistant Staphylococcus aureus (MRSA). MRSA is predictably resistant to beta-lactam antibiotics (except ceftaroline). Preferred therapy is vancomycin unless clinically contraindicated. Patient requires contact precautions if  hospitalized. CRITICAL RESULT CALLED TO, READ BACK BY AND VERIFIED WITH:  DAVID BESANTI AT 0607 03/22/20 SDR    Staphylococcus epidermidis NOT DETECTED NOT DETECTED Final   Staphylococcus lugdunensis NOT DETECTED NOT DETECTED Final   Streptococcus species NOT DETECTED NOT DETECTED Final   Streptococcus agalactiae NOT DETECTED NOT DETECTED Final   Streptococcus pneumoniae NOT DETECTED NOT DETECTED Final   Streptococcus pyogenes NOT DETECTED NOT DETECTED Final   A.calcoaceticus-baumannii NOT DETECTED NOT DETECTED Final   Bacteroides fragilis NOT DETECTED NOT DETECTED Final   Enterobacterales NOT DETECTED NOT DETECTED Final   Enterobacter cloacae complex NOT DETECTED NOT DETECTED Final   Escherichia coli NOT DETECTED NOT DETECTED Final   Klebsiella aerogenes NOT DETECTED NOT DETECTED Final   Klebsiella oxytoca NOT DETECTED NOT DETECTED Final    Klebsiella pneumoniae NOT DETECTED NOT DETECTED Final   Proteus species NOT DETECTED NOT DETECTED Final   Salmonella species NOT DETECTED NOT DETECTED Final   Serratia marcescens NOT DETECTED NOT DETECTED Final   Haemophilus influenzae NOT DETECTED NOT DETECTED Final   Neisseria meningitidis NOT DETECTED NOT DETECTED Final   Pseudomonas aeruginosa NOT DETECTED NOT DETECTED Final   Stenotrophomonas maltophilia NOT DETECTED NOT DETECTED Final   Candida albicans NOT DETECTED NOT DETECTED Final   Candida auris NOT DETECTED NOT DETECTED Final   Candida glabrata NOT DETECTED NOT DETECTED Final   Candida krusei NOT DETECTED NOT DETECTED Final   Candida parapsilosis NOT DETECTED NOT DETECTED Final   Candida tropicalis NOT DETECTED NOT DETECTED Final   Cryptococcus neoformans/gattii NOT DETECTED NOT DETECTED Final   Meth resistant mecA/C and MREJ DETECTED (A) NOT DETECTED Final    Comment: CRITICAL RESULT CALLED TO, READ BACK BY AND VERIFIED WITH:  DAVID BESANTI AT 0607 03/22/20 SDR Performed at [facility], [address] [place], [place] [address]   MRSA PCR Screening     Status: Abnormal   Collection Time: 03/22/20  8:30 AM   Specimen: Nasopharyngeal  Result Value Ref Range Status   MRSA by PCR POSITIVE (A) NEGATIVE Final    Comment:        The GeneXpert MRSA Assay (FDA approved for NASAL specimens only), is one component of a comprehensive MRSA colonization surveillance program. It is not intended to diagnose MRSA infection nor to guide or monitor treatment for MRSA infections. RESULT CALLED TO, READ BACK BY AND VERIFIED WITH:  [facility] WHITE AT 1007 03/22/20 SDR Performed at [facility], [address] [place], [place] [address]     Best Practice/Protocols:  VTE Prophylaxis: Heparin (SQ)   Events: 09/11 admitted due to septic shock, multifocal pneumonia with cavitation, large decubitus ulcer 09/12 MRSA bacteremia identified likely source sacral wound  (see attached photos)  Studies: CT ABDOMEN PELVIS WO CONTRAST  Result Date: 03/13/2020 CLINICAL DATA:  Abdominal pain and decreased appetite x2 days. EXAM: CT ABDOMEN AND PELVIS WITHOUT CONTRAST TECHNIQUE: Multidetector CT imaging of the abdomen and pelvis was performed following the standard protocol without IV contrast. COMPARISON:  Lumbar spine MRI, dated January 08, 2018. FINDINGS: Lower chest: Numerous small cavitary lesions, with mild to moderately thickened surrounding walls, are seen scattered throughout the bilateral lung bases. A 1.2 cm noncalcified lung nodule is seen within the right lung base (axial CT image 10, CT series number 3). Mild to moderate severity areas  of atelectasis and/or infiltrate are noted along the posterior aspects of the bilateral lung bases. Very small bilateral pleural effusions are seen. A very small predominantly anterior pericardial effusion is noted. Hepatobiliary: No focal liver abnormality is seen. No gallstones, gallbladder wall thickening, or biliary dilatation. Pancreas: Unremarkable. No pancreatic ductal dilatation or surrounding inflammatory changes. Spleen: Normal in size without focal abnormality. Adrenals/Urinary Tract: The right adrenal gland is normal in size and appearance. A 3.4 cm x 1.3 cm low-attenuation left adrenal mass is seen (axial CT image 33, CT series number 2). Kidneys are normal, without renal calculi, focal lesion, or hydronephrosis. Bladder is unremarkable. Stomach/Bowel: Stomach is within normal limits. Appendix appears normal. No evidence of bowel dilatation. Noninflamed diverticula are seen within the descending and sigmoid colon. Vascular/Lymphatic: There is mild to moderate severity calcification of the abdominal aorta and bilateral common iliac arteries, without evidence of aneurysmal dilatation. No enlarged abdominal or pelvic lymph nodes. Reproductive: There is mild to moderate severity enlargement of the prostate gland. Other: Multiple  small surgical coils are seen along the anterior pelvic wall. No abdominopelvic ascites. Musculoskeletal: A compression fracture deformity of indeterminate age is seen at the level of L1. The this is not seen on the prior lumbar spine MRI, dated January 08, 2018. No retropulsion of fracture fragments is identified. Mild-to-moderate severity multilevel degenerative changes seen throughout the lumbar spine IMPRESSION: 1. Numerous small cavitary lesions, with mild to moderately thickened surrounding walls, scattered throughout the bilateral lung bases. While this may be secondary to an atypical infectious process, sequelae associated with septic emboli cannot be excluded. 2. 1.2 cm noncalcified lung nodule within the right lung base. Correlation with follow-up chest CT is recommended to determine the presence or absence of additional noncalcified lung nodules. Additional follow-up with a nuclear medicine PET/CT should be considered. 3. Very small bilateral pleural effusions. 4. Colonic diverticulosis. 5. Compression fracture deformity of indeterminate age at the level of L1. The this is not seen on the prior lumbar spine MRI, dated January 08, 2018. Correlation with point tenderness is recommended. Additional MRI follow-up should be considered. 6. Mild to moderate severity enlargement of the prostate gland. 7. Aortic atherosclerosis. Aortic Atherosclerosis (ICD10-I70.0). Electronically Signed   By: Virgina Norfolk M.D.   On: 03/11/2020 20:19   CT CHEST WO CONTRAST  Result Date: 03/22/2020 CLINICAL DATA:  Decreased appetite for 2 days. Intermittent fever for 1 week. Cavitary lesion seen in the lung bases on CT abdomen and pelvis. Completion imaging of the chest. EXAM: CT CHEST WITHOUT CONTRAST TECHNIQUE: Multidetector CT imaging of the chest was performed following the standard protocol without IV contrast. COMPARISON:  CT abdomen and pelvis 03/14/2020.  Chest 03/16/2020 FINDINGS: Cardiovascular: Normal heart size. No  pericardial effusions. Dilated ascending thoracic aorta measuring 4.1 cm diameter. Scattered aortic calcification. Mediastinum/Nodes: Scattered mediastinal lymph nodes are not pathologically enlarged. Esophagus is decompressed. Visualized thyroid gland is unremarkable. Mucoid material demonstrated in the trachea. Lungs/Pleura: Both lungs demonstrate multiple nodular and confluent areas of consolidation. Many of the nodular areas demonstrate central cavitation. Changes are diffusely scattered throughout the lungs. There is more prominent atelectasis or consolidation in the lung bases and in the right upper lung. Small bilateral pleural effusions. Differential diagnosis would include either septic emboli or atypical infectious inflammatory process such as TB or fungal infection. Cavitary metastatic disease would also be possible. Upper Abdomen: No acute process demonstrated in the visualized upper abdomen. Musculoskeletal: No destructive bone lesions. IMPRESSION: 1. Both lungs demonstrate multiple nodular and  confluent areas of consolidation. Most of the nodular areas demonstrate central cavitation. Differential diagnosis would include either septic emboli or atypical infectious or inflammatory process such as TB or fungal infection. Cavitary metastatic disease would also be possible. Pulmonology referral is suggested. 2. Small bilateral pleural effusions. 3. Dilated ascending thoracic aorta measuring 4.1 cm diameter. 4. Aortic atherosclerosis. Aortic Atherosclerosis (ICD10-I70.0). Electronically Signed   By: Lucienne Capers M.D.   On: 03/22/2020 04:03   US RENAL  Result Date: 03/22/2020 CLINICAL DATA:  Acute kidney injury EXAM: RENAL / URINARY TRACT ULTRASOUND COMPLETE COMPARISON:  CT abdomen and pelvis 04/02/2020 FINDINGS: Right Kidney: Renal measurements: 11.1 x 5.0 x 5.9 cm = volume: 170 mL. Cortical thinning. Slightly increased cortical echogenicity. No mass, hydronephrosis, or shadowing calcification. Left  Kidney: Renal measurements: 12.4 x 5.2 x 5.5 cm = volume: 185 mL. Cortical thinning. Normal cortical echogenicity. No mass, hydronephrosis, or shadowing calcification. Bladder: Decompressed, unable to adequately evaluate Other: N/A IMPRESSION: Question medical renal disease changes noted at RIGHT kidney. No evidence of renal mass or hydronephrosis. Electronically Signed   By: Lavonia Dana M.D.   On: 03/22/2020 10:13   DG Chest Port 1 View  Result Date: 04/01/2020 CLINICAL DATA:  Sepsis fever, decreased appetite, dark foul-smelling urine, Parkinson's disease, former smoker EXAM: PORTABLE CHEST 1 VIEW COMPARISON:  Portable exam 1753 hours compared to 09/30/2019 FINDINGS: Normal heart size, mediastinal contours, and pulmonary vascularity. Patchy airspace infiltrates bilaterally consistent with multifocal pneumonia. No pleural effusion or pneumothorax. Osseous structures unremarkable. IMPRESSION: Patchy BILATERAL pulmonary infiltrates consistent with multifocal pneumonia; cannot exclude COVID-19 with this appearance. Electronically Signed   By: Lavonia Dana M.D.   On: 03/20/2020 18:12    Consults: Treatment Team:  Pccm, Ander Gaster, MD Fredirick Maudlin, MD   Subjective:    Overnight Issues: Overnight no new issues.  Still pressor dependent.  Getting volume resuscitation.  Consultation with general surgery to be done today due to need for debridement of large decubitus ulcer.  Objective:  Vital signs for last 24 hours: Temp:  [97.9 F (36.6 C)-99.1 F (37.3 C)] 98.6 F (37 C) (09/12 1557) Pulse Rate:  [67-277] 113 (09/12 1900) Resp:  [22-36] 30 (09/12 1900) BP: (83-124)/(36-78) 98/47 (09/12 1900) SpO2:  [87 %-100 %] 93 % (09/12 1900) Weight:  [68 kg-71 kg] 71 kg (09/12 0826)  Hemodynamic parameters for last 24 hours:    Intake/Output from previous day: 09/11 0701 - 09/12 0700 In: 1223.9 [I.V.:257.2; IV Piggyback:966.7] Out: -   Intake/Output this shift: No intake/output data  recorded.  Vent settings for last 24 hours:    Physical Exam:  GENERAL: Acutely on chronically ill-appearing male, looks older than stated age, mostly nonverbal. HEAD: Normocephalic, atraumatic.  EYES: Pupils equal, round, reactive to light.  No scleral icterus.  MOUTH: Dry mucous membranes, poor dentition. NECK: Supple. No thyromegaly. Trachea midline. No JVD.  No adenopathy. PULMONARY: Good air entry bilaterally.  Coarse breath sounds, rhonchi at bases, no wheezes.   CARDIOVASCULAR: S1 and S2. Regular rate and rhythm.  ABDOMEN: Nondistended, soft, no guarding.  Bowel sounds present. MUSCULOSKELETAL: No joint deformity, no clubbing, no edema.  NEUROLOGIC: No overt focal deficit however patient's mentation is decreased mostly nonverbal. SKIN: Large decubitus ulcer, foul-smelling with purulent drainage.  See attached photos. PSYCH: Withdrawn, flat affect.  Assessment/Plan:   Septic shock due to: Multi-focal pneumonia Pulmonary cavitary lesions: These are classic for MRSA septic emboli Discontinue TB isolation Sacral decubitus ulcer with soft tissue infection CXR shows patchy bilateral  pulmonary infiltrates consistent with multifocal pneumonia.  COVID negative.  CT abd/pelvis was significant for numerous small cavitary lesions, with mild to moderately thickened surrounding walls, scattered throughout the bilateral lung bases.   The findings are consistent with MRSA septic emboli  Continue vancomycin due to MRSA bacteremia, ID consult pending  Sputum for gram stain, C&S (ordered)  Continue volume repletion  Wean off pressors as tolerated  Levophed to maintain MAP > 65  Echocardiogram due to multiple cavitary lesions to screen for endocarditis  Consulted general surgery for debridement, discussed with Dr. Celine Ahr  AKI Likely pre-renal due to poor oral intake, but in setting of sepsis with shock, could be a component of intrinsic with ATN.  Cr 4.01 (normal baseline 0.97), bicarb  23, K 3.4.  UA shows negative nitrite, neg leukocytes, 6-10 WBC, rare bacteria.   No emergent indication for dialysis at this time  Check bladder scan, if any retention, place foley  Renal US  CMP in AM  Avoid nephrotoxic agents  Urine electrolytes, Cr  Renally dose medications, pharmacy consult for Vanc dosing   IV fluids, monitor I/Os  Acute encephalopathy Likely multifactorial including metabolic/renal with AKI and infection, hypotension  Correct underlying illness and organ dysfunction  Avoid or minimize sedatives  Suspect underlying Parkinson's dementia   Noncalcified lung nodule 1.2cm, right lung base Incidental finding on abd/pelvis CT, no old CT imaging for comparison.  He has brief 3 year smoking history in the past.   Least of his worries at present  If he survives this hospitalization follow-up with CT chest in 6 months.  Parkinson's disease:  PTA carbidopa/levodopa Previous stroke:  Stable  Malnutrition:  Severe protein calorie malnutrition , nutrition consult, check prealbumin Severe depression, s/p ECT treatment (2019) and anxiety: Appears withdrawn, suspect due to acute illness      Additional comments: Discussed with Dr. Celine Ahr, surgery.  We will continue to try to update wife, have not been able to contact via phone.    Renold Don, MD [place] [facility] 03/22/2020  *Care during the described time interval was provided by me and/or other providers on the critical care team.  I have reviewed this patient's available data, including medical history, events of note, physical examination and test results as part of my evaluation.  **This note was dictated using voice recognition software/Dragon.  Despite best efforts to proofread, errors can occur which can change the meaning.  Any change was purely unintentional.

## 2020-03-22 NOTE — ED Notes (Signed)
BP 83/54, map 64.  Dr. Jonelle Sidle notified.  Will place order for Levophed.

## 2020-03-23 ENCOUNTER — Inpatient Hospital Stay: Payer: Medicare Other

## 2020-03-23 ENCOUNTER — Inpatient Hospital Stay: Payer: Medicare Other | Admitting: Anesthesiology

## 2020-03-23 ENCOUNTER — Encounter: Admission: EM | Disposition: E | Payer: Self-pay | Source: Home / Self Care | Attending: Internal Medicine

## 2020-03-23 ENCOUNTER — Inpatient Hospital Stay (HOSPITAL_COMMUNITY)
Admit: 2020-03-23 | Discharge: 2020-03-23 | Disposition: A | Discharge status: Home / Self Care | Payer: Medicare Other | Attending: Pulmonary Disease | Admitting: Pulmonary Disease

## 2020-03-23 ENCOUNTER — Encounter: Payer: Self-pay | Admitting: Internal Medicine

## 2020-03-23 DIAGNOSIS — L89159 Pressure ulcer of sacral region, unspecified stage: Secondary | ICD-10-CM

## 2020-03-23 DIAGNOSIS — R6521 Severe sepsis with septic shock: Secondary | ICD-10-CM

## 2020-03-23 DIAGNOSIS — Z515 Encounter for palliative care: Secondary | ICD-10-CM

## 2020-03-23 DIAGNOSIS — N179 Acute kidney failure, unspecified: Secondary | ICD-10-CM

## 2020-03-23 DIAGNOSIS — E440 Moderate protein-calorie malnutrition: Secondary | ICD-10-CM

## 2020-03-23 DIAGNOSIS — A419 Sepsis, unspecified organism: Secondary | ICD-10-CM

## 2020-03-23 DIAGNOSIS — R627 Adult failure to thrive: Secondary | ICD-10-CM

## 2020-03-23 DIAGNOSIS — Z7189 Other specified counseling: Secondary | ICD-10-CM

## 2020-03-23 HISTORY — PX: DEBRIDMENT OF DECUBITUS ULCER: SHX6276

## 2020-03-23 LAB — BASIC METABOLIC PANEL
Anion gap: 14 (ref 5–15)
Anion gap: 12 (ref 5–15)
BUN: 85 mg/dL — ABNORMAL HIGH (ref 8–23)
BUN: 83 mg/dL — ABNORMAL HIGH (ref 8–23)
CO2: 16 mmol/L — ABNORMAL LOW (ref 22–32)
CO2: 20 mmol/L — ABNORMAL LOW (ref 22–32)
Calcium: 7.8 mg/dL — ABNORMAL LOW (ref 8.9–10.3)
Calcium: 7.7 mg/dL — ABNORMAL LOW (ref 8.9–10.3)
Chloride: 110 mmol/L (ref 98–111)
Chloride: 114 mmol/L — ABNORMAL HIGH (ref 98–111)
Creatinine, Ser: 3.09 mg/dL — ABNORMAL HIGH (ref 0.61–1.24)
Creatinine, Ser: 2.42 mg/dL — ABNORMAL HIGH (ref 0.61–1.24)
GFR calc Af Amer: 23 mL/min — ABNORMAL LOW (ref >60)
GFR calc Af Amer: 31 mL/min — ABNORMAL LOW (ref >60)
GFR calc non Af Amer: 20 mL/min — ABNORMAL LOW (ref >60)
GFR calc non Af Amer: 27 mL/min — ABNORMAL LOW (ref >60)
Glucose, Bld: 61 mg/dL — ABNORMAL LOW (ref 70–99)
Glucose, Bld: 105 mg/dL — ABNORMAL HIGH (ref 70–99)
Potassium: 3.5 mmol/L (ref 3.5–5.1)
Potassium: 3.5 mmol/L (ref 3.5–5.1)
Sodium: 140 mmol/L (ref 135–145)
Sodium: 146 mmol/L — ABNORMAL HIGH (ref 135–145)

## 2020-03-23 LAB — ECHOCARDIOGRAM COMPLETE
AR max vel: 2.09 cm2
AV Area VTI: 2.42 cm2
AV Area mean vel: 2.06 cm2
AV Mean grad: 5 mmHg
AV Peak grad: 10.1 mmHg
Ao pk vel: 1.59 m/s
Area-P 1/2: 6.96 cm2
Height: 71 in
S' Lateral: 2.99 cm
Weight: 2610.25 oz

## 2020-03-23 LAB — CBC WITH DIFFERENTIAL/PLATELET
Abs Immature Granulocytes: 0.66 10*3/uL — ABNORMAL HIGH (ref 0.00–0.07)
Basophils Absolute: 0.1 10*3/uL (ref 0.0–0.1)
Basophils Relative: 1 %
Eosinophils Absolute: 0 10*3/uL (ref 0.0–0.5)
Eosinophils Relative: 0 %
HCT: 28.2 % — ABNORMAL LOW (ref 39.0–52.0)
Hemoglobin: 9.8 g/dL — ABNORMAL LOW (ref 13.0–17.0)
Immature Granulocytes: 4 %
Lymphocytes Relative: 3 %
Lymphs Abs: 0.6 10*3/uL — ABNORMAL LOW (ref 0.7–4.0)
MCH: 30.5 pg (ref 26.0–34.0)
MCHC: 34.8 g/dL (ref 30.0–36.0)
MCV: 87.9 fL (ref 80.0–100.0)
Monocytes Absolute: 0.2 10*3/uL (ref 0.1–1.0)
Monocytes Relative: 1 %
Neutro Abs: 16.3 10*3/uL — ABNORMAL HIGH (ref 1.7–7.7)
Neutrophils Relative %: 91 %
Platelets: 62 10*3/uL — ABNORMAL LOW (ref 150–400)
RBC: 3.21 MIL/uL — ABNORMAL LOW (ref 4.22–5.81)
RDW: 15.4 % (ref 11.5–15.5)
Smear Review: DECREASED
WBC: 17.8 10*3/uL — ABNORMAL HIGH (ref 4.0–10.5)
nRBC: 0 % (ref 0.0–0.2)

## 2020-03-23 LAB — BLOOD GAS, ARTERIAL
Acid-base deficit: 8.4 mmol/L — ABNORMAL HIGH (ref 0.0–2.0)
Bicarbonate: 19.2 mmol/L — ABNORMAL LOW (ref 20.0–28.0)
FIO2: 0.5
MECHVT: 500 mL
O2 Saturation: 94.8 %
Patient temperature: 37
RATE: 15 resp/min
pCO2 arterial: 47 mmHg (ref 32.0–48.0)
pH, Arterial: 7.22 — ABNORMAL LOW (ref 7.350–7.450)
pO2, Arterial: 89 mmHg (ref 83.0–108.0)

## 2020-03-23 LAB — URINE CULTURE

## 2020-03-23 LAB — CBC
HCT: 28.9 % — ABNORMAL LOW (ref 39.0–52.0)
HCT: 29.1 % — ABNORMAL LOW (ref 39.0–52.0)
Hemoglobin: 10.6 g/dL — ABNORMAL LOW (ref 13.0–17.0)
Hemoglobin: 9.8 g/dL — ABNORMAL LOW (ref 13.0–17.0)
MCH: 31.4 pg (ref 26.0–34.0)
MCH: 30.1 pg (ref 26.0–34.0)
MCHC: 36.7 g/dL — ABNORMAL HIGH (ref 30.0–36.0)
MCHC: 33.7 g/dL (ref 30.0–36.0)
MCV: 85.5 fL (ref 80.0–100.0)
MCV: 89.3 fL (ref 80.0–100.0)
Platelets: 49 10*3/uL — ABNORMAL LOW (ref 150–400)
Platelets: 51 10*3/uL — ABNORMAL LOW (ref 150–400)
RBC: 3.38 MIL/uL — ABNORMAL LOW (ref 4.22–5.81)
RBC: 3.26 MIL/uL — ABNORMAL LOW (ref 4.22–5.81)
RDW: 15.3 % (ref 11.5–15.5)
RDW: 15.8 % — ABNORMAL HIGH (ref 11.5–15.5)
WBC: 17.9 10*3/uL — ABNORMAL HIGH (ref 4.0–10.5)
WBC: 14.8 10*3/uL — ABNORMAL HIGH (ref 4.0–10.5)
nRBC: 0 % (ref 0.0–0.2)
nRBC: 0 % (ref 0.0–0.2)

## 2020-03-23 LAB — ABO/RH
ABO/RH(D): A POS
ABO/RH(D): A POS

## 2020-03-23 LAB — LEGIONELLA PNEUMOPHILA SEROGP 1 UR AG: L. pneumophila Serogp 1 Ur Ag: NEGATIVE

## 2020-03-23 LAB — GLUCOSE, CAPILLARY
Glucose-Capillary: 106 mg/dL — ABNORMAL HIGH (ref 70–99)
Glucose-Capillary: 95 mg/dL (ref 70–99)
Glucose-Capillary: 106 mg/dL — ABNORMAL HIGH (ref 70–99)

## 2020-03-23 LAB — VANCOMYCIN, RANDOM: Vancomycin Rm: 8

## 2020-03-23 LAB — LACTIC ACID, PLASMA
Lactic Acid, Venous: 2.4 mmol/L (ref 0.5–1.9)
Lactic Acid, Venous: 2.2 mmol/L (ref 0.5–1.9)

## 2020-03-23 LAB — MAGNESIUM: Magnesium: 2.4 mg/dL (ref 1.7–2.4)

## 2020-03-23 SURGERY — DEBRIDMENT OF DECUBITUS ULCER
Anesthesia: General

## 2020-03-23 MED ORDER — FENTANYL CITRATE (PF) 100 MCG/2ML IJ SOLN
INTRAMUSCULAR | Status: DC | PRN
Start: 2020-03-23 — End: 2020-03-23
  Administered 2020-03-23 (×2): 50 ug via INTRAVENOUS

## 2020-03-23 MED ORDER — SODIUM CHLORIDE 0.9 % IV SOLN
INTRAVENOUS | Status: DC | PRN
  Administered 2020-03-23: 100 mL

## 2020-03-23 MED ORDER — ROCURONIUM BROMIDE 100 MG/10ML IV SOLN
INTRAVENOUS | Status: DC | PRN
  Administered 2020-03-23: 30 mg via INTRAVENOUS
  Administered 2020-03-23: 50 mg via INTRAVENOUS

## 2020-03-23 MED ORDER — EPINEPHRINE PF 1 MG/ML IJ SOLN
INTRAMUSCULAR | Status: DC | PRN
  Administered 2020-03-23: 4 mg via SUBCUTANEOUS

## 2020-03-23 MED ORDER — VANCOMYCIN HCL 1500 MG/300ML IV SOLN
1500.0000 mg | Freq: Once | INTRAVENOUS | Status: AC
  Administered 2020-03-23: 1500 mg via INTRAVENOUS
  Filled 2020-03-23: qty 300

## 2020-03-23 MED ORDER — FENTANYL 2500MCG IN NS 250ML (10MCG/ML) PREMIX INFUSION
0.0000 ug/h | INTRAVENOUS | Status: DC
  Administered 2020-03-23: 25 ug/h via INTRAVENOUS
  Administered 2020-03-24 – 2020-03-26 (×2): 75 ug/h via INTRAVENOUS
  Administered 2020-03-27 – 2020-03-28 (×3): 150 ug/h via INTRAVENOUS
  Filled 2020-03-23 (×7): qty 250

## 2020-03-23 MED ORDER — ETOMIDATE 2 MG/ML IV SOLN
INTRAVENOUS | Status: DC | PRN
  Administered 2020-03-23: 20 mg via INTRAVENOUS

## 2020-03-23 MED ORDER — FENTANYL CITRATE (PF) 100 MCG/2ML IJ SOLN
INTRAMUSCULAR | Status: AC
  Filled 2020-03-23: qty 2

## 2020-03-23 MED ORDER — MIDAZOLAM HCL 2 MG/2ML IJ SOLN
INTRAMUSCULAR | Status: AC
  Filled 2020-03-23: qty 2

## 2020-03-23 MED ORDER — MIDAZOLAM HCL 2 MG/2ML IJ SOLN
INTRAMUSCULAR | Status: DC | PRN
  Administered 2020-03-23 (×2): 1 mg via INTRAVENOUS

## 2020-03-23 MED ORDER — ETOMIDATE 2 MG/ML IV SOLN
INTRAVENOUS | Status: AC
  Filled 2020-03-23: qty 10

## 2020-03-23 MED ORDER — PHENYLEPHRINE HCL (PRESSORS) 10 MG/ML IV SOLN
INTRAVENOUS | Status: DC | PRN
  Administered 2020-03-23 (×5): 100 ug via INTRAVENOUS

## 2020-03-23 MED ORDER — SUCCINYLCHOLINE CHLORIDE 20 MG/ML IJ SOLN
INTRAMUSCULAR | Status: DC | PRN
  Administered 2020-03-23: 100 mg via INTRAVENOUS

## 2020-03-23 SURGICAL SUPPLY — 44 items
BLADE SURG 15 STRL LF DISP TIS (BLADE) ×1 IMPLANT
BLADE SURG 15 STRL SS (BLADE) ×2
BNDG GAUZE 4.5X4.1 6PLY STRL (MISCELLANEOUS) ×1 IMPLANT
BRIEF STRETCH MATERNITY 2XLG (MISCELLANEOUS) ×2 IMPLANT
BRUSH SCRUB EZ  4% CHG (MISCELLANEOUS)
BRUSH SCRUB EZ 4% CHG (MISCELLANEOUS) ×1 IMPLANT
CANISTER SUCT 1200ML W/VALVE (MISCELLANEOUS) ×2 IMPLANT
CANISTER WOUND CARE 500ML ATS (WOUND CARE) ×2 IMPLANT
COVER WAND RF STERILE (DRAPES) ×2 IMPLANT
DRAPE LAPAROTOMY 100X77 ABD (DRAPES) ×2 IMPLANT
DRSG VAC ATS MED SENSATRAC (GAUZE/BANDAGES/DRESSINGS) ×2 IMPLANT
ELECT CAUTERY BLADE TIP 2.5 (TIP) ×2
ELECT REM PT RETURN 9FT ADLT (ELECTROSURGICAL) ×2
ELECTRODE CAUTERY BLDE TIP 2.5 (TIP) ×1 IMPLANT
ELECTRODE REM PT RTRN 9FT ADLT (ELECTROSURGICAL) ×1 IMPLANT
GAUZE PACKING IODOFORM 1X5 (PACKING) IMPLANT
GAUZE SPONGE 4X4 12PLY STRL (GAUZE/BANDAGES/DRESSINGS) ×2 IMPLANT
GLOVE BIO SURGEON STRL SZ 6.5 (GLOVE) ×2 IMPLANT
GLOVE INDICATOR 7.0 STRL GRN (GLOVE) ×4 IMPLANT
GOWN STRL REUS W/ TWL LRG LVL3 (GOWN DISPOSABLE) ×2 IMPLANT
GOWN STRL REUS W/TWL LRG LVL3 (GOWN DISPOSABLE) ×4
KIT TURNOVER KIT A (KITS) ×2 IMPLANT
LABEL OR SOLS (LABEL) ×2 IMPLANT
NEEDLE HYPO 22GX1.5 SAFETY (NEEDLE) ×2 IMPLANT
NS IRRIG 1000ML POUR BTL (IV SOLUTION) ×2 IMPLANT
PACK BASIN MINOR (MISCELLANEOUS) ×2 IMPLANT
PAD ABD DERMACEA PRESS 5X9 (GAUZE/BANDAGES/DRESSINGS) ×1 IMPLANT
PULSAVAC PLUS IRRIG FAN TIP (DISPOSABLE)
SOL .9 NS 3000ML IRR  AL (IV SOLUTION) ×2
SOL .9 NS 3000ML IRR AL (IV SOLUTION) ×1
SOL .9 NS 3000ML IRR UROMATIC (IV SOLUTION) ×1 IMPLANT
SOL PREP PVP 2OZ (MISCELLANEOUS) ×2
SOLUTION PREP PVP 2OZ (MISCELLANEOUS) ×1 IMPLANT
SPONGE LAP 18X18 RF (DISPOSABLE) ×2 IMPLANT
SUT ETHILON 3-0 FS-10 30 BLK (SUTURE)
SUT VICRYL+ 3-0 36IN CT-1 (SUTURE) ×1 IMPLANT
SUTURE EHLN 3-0 FS-10 30 BLK (SUTURE) IMPLANT
SWAB DUAL CULTURE TRANS RED ST (MISCELLANEOUS) IMPLANT
SYR 10ML LL (SYRINGE) ×2 IMPLANT
SYR 20ML LL LF (SYRINGE) ×2 IMPLANT
SYR BULB 3OZ (MISCELLANEOUS) IMPLANT
SYR BULB IRRIG 60ML STRL (SYRINGE) ×2 IMPLANT
TAPE CLOTH SURG 4X10 WHT LF (GAUZE/BANDAGES/DRESSINGS) ×1 IMPLANT
TIP FAN IRRIG PULSAVAC PLUS (DISPOSABLE) IMPLANT

## 2020-03-23 NOTE — Plan of Care (Signed)
Pt turned Q2H this shift and bed pad changed PRN for significant wound drainage.  At this time debridement on hold pending possible transition to comfort care.  Pt did receive 1 unit platelets in anticipation of surgery but the second is pending for the possible change of plans.  Famiyl at bedside talking w MD and pall care NP at this time.  Pt mumbles to himself but seems generally unaware of his surroundings, VSS on 3 liters [place]

## 2020-03-23 NOTE — Progress Notes (Signed)
Pharmacy Antibiotic Note  [name] is a 64 y.o. male admitted on 03/30/2020 with pneumonia/staph aureus bacteremia/ sacral decubitus.  Pharmacy has been consulted for vanc/unasyn dosing. ID is following.  Plan: 09/12 @ 2018 VR 8 mcg/mL, patient did not received the vanc 750 mg IV x 1 to complete the vanc 1.75g IV load. Patient only got vanc 1g IV total. Updated Scr not drawn since yesterday morning am labs, renal function most likely improving, will reassess w/ this am labs, and give a vanc 1.5g IV x 1 and recheck 24 hour vanc random and continue to monitor.  Goal random < 20 mcg/mL.   Height: 5\' 11"  (180.3 cm) Weight: 71 kg (156 lb 8.4 oz) IBW/kg (Calculated) : 75.3  Temp (24hrs), Avg:98.7 F (37.1 C), Min:97.9 F (36.6 C), Max:99.1 F (37.3 C)  Recent Labs  Lab 04/09/2020 1744 03/22/20 0106 03/22/20 0517 03/22/20 2018  WBC 10.3  --  11.5*  --   CREATININE 4.01*  --  3.94*  --   LATICACIDVEN 2.7* 3.2* 4.3*  --   VANCORANDOM  --   --   --  8    Estimated Creatinine Clearance: 18.8 mL/min (A) (by C-G formula based on SCr of 3.94 mg/dL (H)).    No Known Allergies  9/12 MRSA PCR + 9/12 BCx MRSA 4/4 bottles  Thank you for allowing pharmacy to be a part of this patient's care.  Tobie Lords, PharmD, BCPS Clinical Pharmacist 03/31/2020 2:39 AM

## 2020-03-23 NOTE — Progress Notes (Signed)
Initial Nutrition Assessment  DOCUMENTATION CODES:   Severe malnutrition in context of chronic illness  INTERVENTION:   RD will monitor for GOC  NUTRITION DIAGNOSIS:   Severe Malnutrition related to chronic illness (parkinson's) as evidenced by moderate to severe fat depletions, moderate to severe muscle depletions.  GOAL:   Patient will meet greater than or equal to 90% of their needs  MONITOR:   Diet advancement, Labs, Weight trends, Skin, I & O's, Other (Comment) (GOC)  REASON FOR ASSESSMENT:   Consult Assessment of nutrition requirement/status  ASSESSMENT:   64 year old male with history of Parkinson's disease, previous stroke, hyperlipidemia, BPH, malnutrition, vitamin D deficiency, severe depression, s/p ECT treatment (2019) and anxiety who is being admitted with working diagnosis of multifocal pneumonia with cavitary lesions, concern for atypical infection, rule out TB, septic shock requiring low-dose vasopressors and severe AKI.  Met with pt at bedside today. Pt is non-verbal and unable to provide any history. Pt has been NPO today for possible I & D. Per MD note, pt is dying; family is coming in to decide about [place].   There are no recent documented weights in chart to determine if any significant weight changes. Pt weighed 184lbs in 2019.   Medications reviewed and include: heparin, vitamin D, NaCl w/ KCl '@100ml' /hr, unasyn, LRS w/ 5% dextrose '@125ml' /hr, levophed  Labs reviewed: BUN 85(H), creat 3.09(H) Prealbumin <5(L) Wbc- 17.8(H), Hgb 9.8(L), Hct 28.2(L)  NUTRITION - FOCUSED PHYSICAL EXAM:    Most Recent Value  Orbital Region Moderate depletion  Upper Arm Region Severe depletion  Thoracic and Lumbar Region Moderate depletion  Buccal Region Moderate depletion  Temple Region Moderate depletion  Clavicle Bone Region Moderate depletion  Clavicle and Acromion Bone Region Moderate depletion  Scapular Bone Region Moderate depletion  Dorsal Hand Severe depletion   Patellar Region Severe depletion  Anterior Thigh Region Severe depletion  Posterior Calf Region Severe depletion  Edema (RD Assessment) None  Hair Reviewed  Eyes Reviewed  Mouth Reviewed  Skin Reviewed  Nails Reviewed     Diet Order:   Diet Order            Diet NPO time specified  Diet effective midnight                EDUCATION NEEDS:   Not appropriate for education at this time  Skin:  Skin Assessment: Reviewed RN Assessment (unstageable sacral wound)  Last BM:  pta  Height:   Ht Readings from Last 1 Encounters:  03/22/20 '5\' 11"'  (1.803 m)    Weight:   Wt Readings from Last 1 Encounters:  03/20/2020 74 kg    Ideal Body Weight:  78.18 kg  BMI:  Body mass index is 22.75 kg/m.  Estimated Nutritional Needs:   Kcal:  2100-2400kcal/day  Protein:  105-120g/day  Fluid:  >1.9L/day  Koleen Distance MS, RD, LDN Please refer to [facility] for RD and/or RD on-call/weekend/after hours pager

## 2020-03-23 NOTE — Transfer of Care (Signed)
Immediate Anesthesia Transfer of Care Note  Patient: [name]  Procedure(s) Performed: DEBRIDMENT OF DECUBITUS ULCER (N/A )  Patient Location: PACU  Anesthesia Type:General  Level of Consciousness: Patient remains intubated per anesthesia plan  Airway & Oxygen Therapy: Patient remains intubated per anesthesia plan  Post-op Assessment: Report given to RN and Post -op Vital signs reviewed and stable  Post vital signs: Reviewed and stable  Last Vitals:  Vitals Value Taken Time  BP    Temp    Pulse 83 04/06/2020 1852  Resp    SpO2 92 % 03/12/2020 1852  Vitals shown include unvalidated device data.  Last Pain:  Vitals:   04/05/2020 1600  TempSrc: Oral  PainSc:          Complications: No complications documented.

## 2020-03-23 NOTE — Op Note (Signed)
Operative Note  Preoperative Diagnosis:  Unstageable sacral pressure ulcer   Postoperative Diagnosis:  Stage IV sacral pressure ulcer  Operation:  Excisional debridment of skin, soft tissue, muscle totaling 208 sq cm; biopsy of sacral bone  Surgeon: Fredirick Maudlin, MD  Assistant: none  Anesthesia: GETA  Findings: liquifactive necrosis of subcutaneous fat, exposed sacral bone. Post-debridement wound = 16 x 13 x 1.5 cm.   Indications: patient admitted to hospital with unstageable sacral pressure ulcer. He is septic from the wound.     Procedure In Detail: Consent was obtained from the patient's family. He was brought to the OR and intubated on his hospital bed.  He was turned prone on the OR table. Bony prominences were padded. His arms were supported on the OR table arm boards and his body supported on a purpose-made chest support.  The wound was prepped with betadine and draped in standard fashion.  A time out was performed, confirming the patient's identity, the procedure being performed, all necessary equipment was available, and that maintenance anesthesia was adequate. He is receiving scheduled antibiotics in the ICU.  The skin surrounding the lesion was sharply excised.  Liquified necrotic fat was encountered.  A pocket of pus was cultured.  I continued to excise tissue until all visibly necrotic areas were excised down to bleeding tissue. The sacral bone was exposed.  A rongeur was used to take bone biopsies for culture and histopathology.  Hemostasis was achieved with limited electrocautery use, as well as applying a Telfa soaked with epinephrine. Liposomal bupivicaine was injected throughout the wound bed. The wound was then packed with saline-moistened Kerlix, then covered with ABD pads, which were secured with Medipore tape.   The patient was turned supine on his ICU bed and transported back to the unit, intubated.  Will plan for a second look procedure with possible VAC placement  on Wednesday.  EBL: approximately 30 cc  IVF: see anesthesia record  Specimen(s): deep sacral bone for histopathology and culture; sample of excised tissue for histopathology; deep wound swab for culture.  Complications: none immediately apparent.   Counts: all needles, instruments, and sponges were counted and reported to be correct in number at the end of the case.   I was present for and participated in the entire operation.  Fredirick Maudlin 7:02 PM

## 2020-03-23 NOTE — Progress Notes (Signed)
CRITICAL CARE NOTE  CC  follow up severe sepsis  SUBJECTIVE Patient remains critically ill Prognosis is guarded +MRSA bacteremia + SACRAL wound foul smelling  Patient nonverbal, patient is in dying process Patient is suffering Findings relayed to wife    BP 107/61   Pulse 92   Temp 98.7 F (37.1 C) (Axillary)   Resp (!) 29   Ht 5\' 11"  (1.803 m)   Wt 74 kg   SpO2 93%   BMI 22.75 kg/m    I/O last 3 completed shifts: In: 5166.5 [I.V.:2698.5; IV Piggyback:2468] Out: 6962 [Urine:1085] Total I/O In: 411.9 [I.V.:320.8; IV Piggyback:91.1] Out: 300 [Urine:300]  SpO2: 93 % O2 Flow Rate (L/min): 3 L/min  Estimated body mass index is 22.75 kg/m as calculated from the following:   Height as of this encounter: 5\' 11"  (1.803 m).   Weight as of this encounter: 74 kg.  SIGNIFICANT EVENTS   REVIEW OF SYSTEMS  PATIENT IS UNABLE TO PROVIDE COMPLETE REVIEW OF SYSTEMS DUE TO SEVERE CRITICAL ILLNESS   Pressure Injury 01/08/18 Stage I -  Intact skin with non-blanchable redness of a localized area usually over a bony prominence. Bilateral unblanchable redness on buttocks (Active)  01/08/18 2351  Location: Buttocks  Location Orientation: Medial;Bilateral  Staging: Stage I -  Intact skin with non-blanchable redness of a localized area usually over a bony prominence.  Wound Description (Comments): Bilateral unblanchable redness on buttocks  Present on Admission: Yes     Pressure Injury 04/05/2020 Sacrum Right;Left;Posterior Unstageable - Full thickness tissue loss in which the base of the injury is covered by slough (yellow, tan, gray, green or brown) and/or eschar (tan, brown or black) in the wound bed. Pt with necrosis  (Active)  04/08/2020 1744  Location: Sacrum  Location Orientation: Right;Left;Posterior  Staging: Unstageable - Full thickness tissue loss in which the base of the injury is covered by slough (yellow, tan, gray, green or brown) and/or eschar (tan, brown or black) in  the wound bed.  Wound Description (Comments): Pt with necrosis on sacral wound   Present on Admission:       PHYSICAL EXAMINATION:  GENERAL:critically ill appearing, HEAD: Normocephalic, atraumatic.  EYES: Pupils equal, round, reactive to light.  No scleral icterus.  MOUTH: Moist mucosal membrane. NECK: Supple.  PULMONARY: +rhonchi,  CARDIOVASCULAR: S1 and S2. Regular rate and rhythm. No murmurs, rubs, or gallops.  GASTROINTESTINAL: Soft, nontender, -distended.  Positive bowel sounds.   MUSCULOSKELETAL: No swelling, clubbing, or edema.  NEUROLOGIC: obtunded SKIN:stage 4 sacral wound  MEDICATIONS: I have reviewed all medications and confirmed regimen as documented   CULTURE RESULTS   Recent Results (from the past 240 hour(s))  Blood Culture (routine x 2)     Status: Abnormal (Preliminary result)   Collection Time: 03/16/2020  5:43 PM   Specimen: BLOOD  Result Value Ref Range Status   Specimen Description   Final    BLOOD LEFT FORE ARM Performed at [facility], [address], [place], [place] [address]    Special Requests   Final    BOTTLES DRAWN AEROBIC AND ANAEROBIC Blood Culture adequate volume Performed at [facility], [address], [place], [place] [address]    Culture  Setup Time   Final    GRAM POSITIVE COCCI IN BOTH AEROBIC AND ANAEROBIC BOTTLES CRITICAL VALUE NOTED.  VALUE IS CONSISTENT WITH PREVIOUSLY REPORTED AND CALLED VALUE. Performed at [facility], [address] [place], [place] [address]    Culture STAPHYLOCOCCUS AUREUS (  A)  Final   Report Status PENDING  Incomplete  SARS Coronavirus 2 by RT PCR (hospital order, performed in [facility] hospital lab) Nasopharyngeal Nasopharyngeal Swab     Status: None   Collection Time: 04/05/2020  5:43 PM   Specimen: Nasopharyngeal Swab  Result Value Ref Range Status   SARS Coronavirus 2 NEGATIVE NEGATIVE Final    Comment: (NOTE) SARS-CoV-2 target nucleic acids are NOT  DETECTED.  The SARS-CoV-2 RNA is generally detectable in upper and lower respiratory specimens during the acute phase of infection. The lowest concentration of SARS-CoV-2 viral copies this assay can detect is 250 copies / mL. A negative result does not preclude SARS-CoV-2 infection and should not be used as the sole basis for treatment or other patient management decisions.  A negative result may occur with improper specimen collection / handling, submission of specimen other than nasopharyngeal swab, presence of viral mutation(s) within the areas targeted by this assay, and inadequate number of viral copies (<250 copies / mL). A negative result must be combined with clinical observations, patient history, and epidemiological information.  Fact Sheet for Patients:   StrictlyIdeas.no  Fact Sheet for Healthcare Providers: BankingDealers.co.za  This test is not yet approved or  cleared by the [place] FDA and has been authorized for detection and/or diagnosis of SARS-CoV-2 by FDA under an Emergency Use Authorization (EUA).  This EUA will remain in effect (meaning this test can be used) for the duration of the COVID-19 declaration under Section 564(b)(1) of the Act, 21 U.S.C. section 360bbb-3(b)(1), unless the authorization is terminated or revoked sooner.  Performed at [facility], [address] [place], [place] [address]   Blood Culture (routine x 2)     Status: Abnormal (Preliminary result)   Collection Time: 03/11/2020  5:44 PM   Specimen: BLOOD  Result Value Ref Range Status   Specimen Description   Final    BLOOD RIGHT FORE ARM Performed at [facility], [address] [place], [place] [address]    Special Requests   Final    BOTTLES DRAWN AEROBIC AND ANAEROBIC Blood Culture adequate volume Performed at [facility], [address] [place], [place] [address]    Culture  Setup Time   Final     Organism ID to follow [place] AND ANAEROBIC BOTTLES CRITICAL RESULT CALLED TO, READ BACK BY AND VERIFIED WITH: DAVID BESANTI AT 0607 03/22/20 [place] Performed at [facility], [address] [place], [place] [address]    Culture STAPHYLOCOCCUS AUREUS (A)  Final   Report Status PENDING  Incomplete  Blood Culture ID Panel (Reflexed)     Status: Abnormal   Collection Time: 03/30/2020  5:44 PM  Result Value Ref Range Status   Enterococcus faecalis NOT DETECTED NOT DETECTED Final   Enterococcus Faecium NOT DETECTED NOT DETECTED Final   Listeria monocytogenes NOT DETECTED NOT DETECTED Final   Staphylococcus species DETECTED (A) NOT DETECTED Final    Comment: CRITICAL RESULT CALLED TO, READ BACK BY AND VERIFIED WITH:  DAVID BESANTI AT 0607 03/22/20 SDR    Staphylococcus aureus (BCID) DETECTED (A) NOT DETECTED Final    Comment: Methicillin (oxacillin)-resistant Staphylococcus aureus (MRSA). MRSA is predictably resistant to beta-lactam antibiotics (except ceftaroline). Preferred therapy is vancomycin unless clinically contraindicated. Patient requires contact precautions if  hospitalized. CRITICAL RESULT CALLED TO, READ BACK BY AND VERIFIED WITH:  DAVID BESANTI AT 7628 03/22/20 SDR    Staphylococcus epidermidis NOT DETECTED NOT DETECTED  Final   Staphylococcus lugdunensis NOT DETECTED NOT DETECTED Final   Streptococcus species NOT DETECTED NOT DETECTED Final   Streptococcus agalactiae NOT DETECTED NOT DETECTED Final   Streptococcus pneumoniae NOT DETECTED NOT DETECTED Final   Streptococcus pyogenes NOT DETECTED NOT DETECTED Final   A.calcoaceticus-baumannii NOT DETECTED NOT DETECTED Final   Bacteroides fragilis NOT DETECTED NOT DETECTED Final   Enterobacterales NOT DETECTED NOT DETECTED Final   Enterobacter cloacae complex NOT DETECTED NOT DETECTED Final   Escherichia coli NOT DETECTED NOT DETECTED Final   Klebsiella aerogenes NOT DETECTED NOT DETECTED  Final   Klebsiella oxytoca NOT DETECTED NOT DETECTED Final   Klebsiella pneumoniae NOT DETECTED NOT DETECTED Final   Proteus species NOT DETECTED NOT DETECTED Final   Salmonella species NOT DETECTED NOT DETECTED Final   Serratia marcescens NOT DETECTED NOT DETECTED Final   Haemophilus influenzae NOT DETECTED NOT DETECTED Final   Neisseria meningitidis NOT DETECTED NOT DETECTED Final   Pseudomonas aeruginosa NOT DETECTED NOT DETECTED Final   Stenotrophomonas maltophilia NOT DETECTED NOT DETECTED Final   Candida albicans NOT DETECTED NOT DETECTED Final   Candida auris NOT DETECTED NOT DETECTED Final   Candida glabrata NOT DETECTED NOT DETECTED Final   Candida krusei NOT DETECTED NOT DETECTED Final   Candida parapsilosis NOT DETECTED NOT DETECTED Final   Candida tropicalis NOT DETECTED NOT DETECTED Final   Cryptococcus neoformans/gattii NOT DETECTED NOT DETECTED Final   Meth resistant mecA/C and MREJ DETECTED (A) NOT DETECTED Final    Comment: CRITICAL RESULT CALLED TO, READ BACK BY AND VERIFIED WITH:  DAVID BESANTI AT 0607 03/22/20 SDR Performed at [facility], [address], [place], [place] [address]   Urine culture     Status: Abnormal   Collection Time: 03/29/2020  6:52 PM   Specimen: Urine, Random  Result Value Ref Range Status   Specimen Description   Final    URINE, RANDOM Performed at [facility], [address] [place], [place] [address]    Special Requests   Final    NONE Performed at [facility], [address] [place], [place] [address]    Culture MULTIPLE SPECIES PRESENT, SUGGEST RECOLLECTION (A)  Final   Report Status 03/12/2020 FINAL  Final  MRSA PCR Screening     Status: Abnormal   Collection Time: 03/22/20  8:30 AM   Specimen: Nasopharyngeal  Result Value Ref Range Status   MRSA by PCR POSITIVE (A) NEGATIVE Final    Comment:        The GeneXpert MRSA Assay (FDA approved for NASAL specimens only), is one component of  a comprehensive MRSA colonization surveillance program. It is not intended to diagnose MRSA infection nor to guide or monitor treatment for MRSA infections. RESULT CALLED TO, READ BACK BY AND VERIFIED WITH:  [facility] WHITE AT 1007 03/22/20 SDR Performed at [facility], [address] [place], [place] [address]           IMAGING    ECHOCARDIOGRAM COMPLETE  Result Date: 03/15/2020    ECHOCARDIOGRAM REPORT   Patient Name:   [name] Date of Exam: 03/15/2020 Medical Rec #:  [id number]       Height:       71.0 in Accession #:    [id number]      Weight:       163.1 lb Date of Birth:  Oct 24, 1955        BSA:  1.934 m Patient Age:    24 years        BP:           107/64 mmHg Patient Gender: M               HR:           96 bpm. Exam Location:  [facility] Procedure: 2D Echo, Color Doppler and Cardiac Doppler Indications:     R50.9 Fever  History:         Patient has no prior history of Echocardiogram examinations.                  Stroke; Risk Factors:Dyslipidemia.  Sonographer:     Charmayne Sheer RDCS (AE) Referring Phys:  [id number] Cheryll Dessert Diagnosing Phys: Kathlyn Sacramento MD IMPRESSIONS  1. Left ventricular ejection fraction, by estimation, is 60 to 65%. The left ventricle has normal function. The left ventricle has no regional wall motion abnormalities. Left ventricular diastolic parameters were normal.  2. Right ventricular systolic function is normal. The right ventricular size is normal. Tricuspid regurgitation signal is inadequate for assessing PA pressure.  3. The mitral valve is normal in structure. No evidence of mitral valve regurgitation. No evidence of mitral stenosis.  4. The aortic valve is normal in structure. Aortic valve regurgitation is not visualized. Mild aortic valve sclerosis is present, with no evidence of aortic valve stenosis.  5. The inferior vena cava is normal in size with greater than 50% respiratory variability, suggesting right atrial pressure of 3 mmHg.  Conclusion(s)/Recommendation(s): No evidence of valvular vegetations on this transthoracic echocardiogram. FINDINGS  Left Ventricle: Left ventricular ejection fraction, by estimation, is 60 to 65%. The left ventricle has normal function. The left ventricle has no regional wall motion abnormalities. The left ventricular internal cavity size was normal in size. There is  no left ventricular hypertrophy. Left ventricular diastolic parameters were normal. Right Ventricle: The right ventricular size is normal. No increase in right ventricular wall thickness. Right ventricular systolic function is normal. Tricuspid regurgitation signal is inadequate for assessing PA pressure. Left Atrium: Left atrial size was normal in size. Right Atrium: Right atrial size was normal in size. Pericardium: There is no evidence of pericardial effusion. Mitral Valve: The mitral valve is normal in structure. No evidence of mitral valve regurgitation. No evidence of mitral valve stenosis. MV peak gradient, 5.8 mmHg. The mean mitral valve gradient is 3.0 mmHg. Tricuspid Valve: The tricuspid valve is normal in structure. Tricuspid valve regurgitation is not demonstrated. No evidence of tricuspid stenosis. Aortic Valve: The aortic valve is normal in structure. Aortic valve regurgitation is not visualized. Mild aortic valve sclerosis is present, with no evidence of aortic valve stenosis. Aortic valve mean gradient measures 5.0 mmHg. Aortic valve peak gradient measures 10.1 mmHg. Aortic valve area, by VTI measures 2.42 cm. Pulmonic Valve: The pulmonic valve was normal in structure. Pulmonic valve regurgitation is not visualized. No evidence of pulmonic stenosis. Aorta: The aortic root is normal in size and structure. Venous: The inferior vena cava is normal in size with greater than 50% respiratory variability, suggesting right atrial pressure of 3 mmHg. IAS/Shunts: No atrial level shunt detected by color flow Doppler.  LEFT VENTRICLE PLAX 2D  LVIDd:         4.59 cm  Diastology LVIDs:         2.99 cm  LV e' medial:    9.14 cm/s LV PW:  0.92 cm  LV E/e' medial:  11.4 LV IVS:        0.81 cm  LV e' lateral:   10.60 cm/s LVOT diam:     1.90 cm  LV E/e' lateral: 9.8 LV SV:         64 LV SV Index:   33 LVOT Area:     2.84 cm  RIGHT VENTRICLE RV Basal diam:  3.02 cm LEFT ATRIUM             Index       RIGHT ATRIUM           Index LA diam:        3.40 cm 1.76 cm/m  RA Area:     13.00 cm LA Vol (A2C):   31.8 ml 16.45 ml/m RA Volume:   30.90 ml  15.98 ml/m LA Vol (A4C):   29.8 ml 15.41 ml/m LA Biplane Vol: 34.2 ml 17.69 ml/m  AORTIC VALVE                    PULMONIC VALVE AV Area (Vmax):    2.09 cm     PV Vmax:       1.18 m/s AV Area (Vmean):   2.06 cm     PV Vmean:      69.600 cm/s AV Area (VTI):     2.42 cm     PV VTI:        0.174 m AV Vmax:           159.00 cm/s  PV Peak grad:  5.6 mmHg AV Vmean:          107.000 cm/s PV Mean grad:  2.0 mmHg AV VTI:            0.266 m AV Peak Grad:      10.1 mmHg AV Mean Grad:      5.0 mmHg LVOT Vmax:         117.00 cm/s LVOT Vmean:        77.800 cm/s LVOT VTI:          0.227 m LVOT/AV VTI ratio: 0.85  AORTA Ao Root diam: 3.20 cm MITRAL VALVE MV Area (PHT): 6.96 cm     SHUNTS MV Peak grad:  5.8 mmHg     Systemic VTI:  0.23 m MV Mean grad:  3.0 mmHg     Systemic Diam: 1.90 cm MV Vmax:       1.20 m/s MV Vmean:      82.1 cm/s MV Decel Time: 109 msec MV E velocity: 104.00 cm/s MV A velocity: 85.40 cm/s MV E/A ratio:  1.22 Kathlyn Sacramento MD Electronically signed by Kathlyn Sacramento MD Signature Date/Time: 03/16/2020/8:55:45 AM    Final      Nutrition Status:           Indwelling Urinary Catheter continued, requirement due to   Reason to continue Indwelling Urinary Catheter strict Intake/Output monitoring for hemodynamic instability   Central Line/ continued, requirement due to  Reason to continue Reagan of central venous pressure or other hemodynamic parameters and poor IV access    Ventilator continued, requirement due to severe respiratory failure   Ventilator Sedation RASS 0 to -2      ASSESSMENT AND PLAN SYNOPSIS  SEVERE SEPSIS MRSA BACTEREMIA WITH END STAGE PARKINSONS DISEASE GANGRENOUS SACRAL WOUND PATIENT IS SUFFERING AND DYING PATIENT HIGH RISK FOR SURGERY  CONTINUE IV ABX DNR/DNI RECOMMEND COMFORT CARE MEASURES WIFE UPDATE-WILL  BE VIVISTING TODAY PALLIATIVE CARE TEAM CONSULTED   Critical Care Time devoted to patient care services described in this note is 34 minutes.   Overall, patient is critically ill, prognosis is guarded.  Patient with Multiorgan failure and at high risk for cardiac arrest and death.    Corrin Parker, M.D.  [facility]  Medical Director [place] Director [facility] Cardio-Pulmonary Department

## 2020-03-23 NOTE — Anesthesia Postprocedure Evaluation (Signed)
Anesthesia Post Note  Patient: [name]  Procedure(s) Performed: DEBRIDMENT OF DECUBITUS ULCER (N/A )  Patient location during evaluation: SICU Anesthesia Type: General Level of consciousness: sedated Pain management: pain level controlled Vital Signs Assessment: post-procedure vital signs reviewed and stable Respiratory status: patient remains intubated per anesthesia plan Cardiovascular status: stable Postop Assessment: no apparent nausea or vomiting Anesthetic complications: no   No complications documented.   Last Vitals:  Vitals:   03/18/2020 1700 03/17/2020 1847  BP: (!) 98/59   Pulse: 92   Resp: (!) 31   Temp:    SpO2: 91% 95%    Last Pain:  Vitals:   03/29/2020 1600  TempSrc: Oral  PainSc:                  Precious Haws Alohilani Levenhagen

## 2020-03-23 NOTE — Anesthesia Procedure Notes (Signed)
Procedure Name: Intubation Date/Time: 03/31/2020 5:25 PM Performed by: Allean Found, CRNA Pre-anesthesia Checklist: Patient identified, Patient being monitored, Timeout performed, Emergency Drugs available and Suction available Patient Re-evaluated:Patient Re-evaluated prior to induction Oxygen Delivery Method: Circle system utilized Preoxygenation: Pre-oxygenation with 100% oxygen Induction Type: IV induction Ventilation: Mask ventilation without difficulty Laryngoscope Size: McGraph and 4 Grade View: Grade I Tube type: Oral Tube size: 7.5 mm Number of attempts: 1 Airway Equipment and Method: Stylet Placement Confirmation: ETT inserted through vocal cords under direct vision,  positive ETCO2 and breath sounds checked- equal and bilateral Secured at: 21 cm Tube secured with: Tape Dental Injury: Teeth and Oropharynx as per pre-operative assessment

## 2020-03-23 NOTE — Progress Notes (Addendum)
[name] visited pt. per RN request; family invited by Dr. Mortimer Fries to come to hopsital w/news that pt. is in multiorgan failure; wife [name] and grandson [name] at bedside.  Pt. lying down in bed, drowsy/lethargic, able to mumble responses to questions but did not seem to be fully alert/oriented; suffers from Parkinson's and depression, per family.  Wife shared, 'he's a very religious man; he prays all the time, and the last two years he's been praying "God please let me go walk the streets [place] so I'm not a burden to [place]."'  Nanci Pina was raised by his grandparents from 20 mo. old, [name] shared; [name] tearful, leaning over pt. talking to him, stroking his hair; [name] similarly upset, experiencing anticipatory grief.  Family asked pt. if he wanted prayer and pt. nodded; [place] prayed for pt. to experience divine peace and presence and for family to experience divine comfort. CH present for meeting w/family, Dr. Mortimer Fries, and surgical team; plan to take pt. to surgery was discussed --> grandson concerned that not opting for surgery is 'giving up'; wife tearfully opposed to surgery, but defers to grandson in this decision; grandson attempted repeatedly to ask pt. his wishes, but this was difficult to ascertain.  Palliative Care NP Crystal also visited w/family.  Ultimately, grandson decided to move forward w/surgery.  [facility] remains available as needed; follow-up recommended for family support.

## 2020-03-23 NOTE — Progress Notes (Signed)
*  PRELIMINARY RESULTS* Echocardiogram 2D Echocardiogram has been performed.  [name] 03/25/2020, 8:31 AM

## 2020-03-23 NOTE — Telephone Encounter (Signed)
Please see encounter from 9-10 at 3:04PM order was refaxed. KW

## 2020-03-23 NOTE — Anesthesia Preprocedure Evaluation (Addendum)
Anesthesia Evaluation  Patient identified by MRN, date of birth, ID band Patient confused    Reviewed: Allergy & Precautions, NPO status , Patient's Chart, lab work & pertinent test results  Airway Mallampati: III  TM Distance: >3 FB Neck ROM: limited    Dental  (+) Poor Dentition, Chipped, Missing   Pulmonary pneumonia, unresolved, former smoker,    + rhonchi        Cardiovascular negative cardio ROS       Neuro/Psych PSYCHIATRIC DISORDERS Anxiety Depression Parkinsons dz  Neuromuscular disease CVA, Residual Symptoms    GI/Hepatic malnutrition   Endo/Other    Renal/GU Renal disease  negative genitourinary   Musculoskeletal  (+) Arthritis , Osteoarthritis,    Abdominal Normal abdominal exam  (+)   Peds negative pediatric ROS (+)  Hematology  (+) Blood dyscrasia, ,   Anesthesia Other Findings Past Medical History: 04/01/2020: AKI (acute kidney injury) ([place]) No date: Anxiety No date: Cataract No date: Depression 01/10/2019: Hyperlipidemia 12/25/2018: Idiopathic peripheral neuropathy No date: Prostate disease No date: Stroke [facility])  Reproductive/Obstetrics                           Anesthesia Physical Anesthesia Plan  ASA: V  Anesthesia Plan: General   Post-op Pain Management:    Induction: Intravenous  PONV Risk Score and Plan:   Airway Management Planned: Oral ETT  Additional Equipment:   Intra-op Plan:   Post-operative Plan: Extubation in OR and Post-operative intubation/ventilation  Informed Consent: I have reviewed the patients History and Physical, chart, labs and discussed the procedure including the risks, benefits and alternatives for the proposed anesthesia with the patient or authorized representative who has indicated his/her understanding and acceptance.   Patient has DNR.  Discussed DNR with power of attorney and Suspend DNR.   Dental advisory given  Plan  Discussed with: CRNA and Surgeon  Anesthesia Plan Comments: (Wife informed that they are higher risk for complications from anesthesia during this procedure due to their medical history.  Patient voiced understanding.  Wife consented for risks of anesthesia including but not limited to:  - adverse reactions to medications - damage to eyes, teeth, lips or other oral mucosa - nerve damage due to positioning  - sore throat or hoarseness - Damage to heart, brain, nerves, lungs, other parts of body or loss of life  She voiced understanding.)      Anesthesia Quick Evaluation

## 2020-03-23 NOTE — Plan of Care (Signed)
Patient is resting in bed. Per CCM, he has just spoken with family. Patient is in dying process and family is coming to spend time with him.

## 2020-03-23 NOTE — Consult Note (Addendum)
NAME: [name]  DOB: 1956-02-15  MRN: [id number]  Date/Time: 03/15/2020 12:59 PM  REQUESTING PROVIDER: MRSA bacteremia Subjective:  REASON FOR CONSULT: Dr. Patsey Berthold ? [name] is a 64 y.o. male with a history of Depression with ECT, Parkinson's disease followed by Dr. 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  As per wife patient was also having dark and foul-smelling urine.  In the ED his vitals were temperature of 98.2, BP of 91/61, respiratory rate of 32, HR of 103 and oxygen sats of 92% on room air. Labs revealed hemoglobin 12.2, platelet 84, white count of 10.3, sodium of 134, K of 3.4, CL of 97, CO2 23 and creatinine 4.  Chest x-ray showed bilateral pulmonary infiltrates consistent with multifocal pneumonia.  COVID-19 screen was negative. CT abdomen pelvis showed numerous small cavitary lesions in the lung bases, bilateral pleural effusion and multiple compression fractures especially of L1. He was also found to have a sacral decubitus on examination. He was started on vancomycin and Zosyn and Zithromax.  Patient is currently in ICU Blood cultures came back as MRSA and I am seeing the patient for the same.  Past Medical History:  Diagnosis Date  . AKI (acute kidney injury) ([place]) 03/30/2020  . Anxiety   . Cataract   . Depression   . Hyperlipidemia 01/10/2019  . Idiopathic peripheral neuropathy 12/25/2018  . Prostate disease   . Stroke [facility])     Past Surgical History:  Procedure Laterality Date  . COLONOSCOPY WITH PROPOFOL N/A 03/22/2018   Procedure: COLONOSCOPY WITH PROPOFOL;  Surgeon: Jonathon Bellows, MD;  Location[facility] ENDOSCOPY;  Service: Gastroenterology;  Laterality: N/A;  . HERNIA REPAIR        Social History   Socioeconomic History  . Marital status: Married    Spouse name: Reitta  . Number of children: 1  . Years of education: Not on file  . Highest education level: Not on file  Occupational History  . Occupation: retired  Tobacco Use  . Smoking status: Former Research scientist (life sciences)  . Smokeless tobacco: Former Systems developer    Quit date: 01/10/2017  Vaping Use  . Vaping Use: Never used  Substance and Sexual Activity  . Alcohol use: Not Currently  . Drug use: Not Currently  . Sexual activity: Not Currently  Other Topics Concern  . Not on file  Social History Narrative  . Not on file   Social Determinants of Health   Financial Resource Strain: Low Risk   . Difficulty of Paying Living Expenses: Not hard at all  Food Insecurity: No Food Insecurity  . Worried About Charity fundraiser in the Last Year: Never true  . Ran Out of Food in the Last Year: Never true  Transportation Needs: No Transportation Needs  . Lack of Transportation (Medical): No  . Lack of Transportation (Non-Medical): No  Physical Activity: Inactive  . Days of Exercise per Week: 0 days  . Minutes of Exercise per Session: 0 min  Stress: Stress Concern Present  . Feeling of Stress : To some extent  Social Connections: Socially Isolated  . Frequency of Communication with Friends and Family: Once a week  . Frequency of Social Gatherings with Friends and Family: Three times a week  . Attends Religious  Services: Never  . Active Member of Clubs or Organizations: No  . Attends Archivist Meetings: Never  . Marital Status: Separated  Intimate Partner Violence:   . Fear of Current or Ex-Partner: Not on file  . Emotionally Abused: Not on file  . Physically Abused: Not on file  . Sexually Abused: Not on file    Family History  Problem Relation Age of Onset  . Depression Father    No Known Allergies  ? Current Facility-Administered Medications  Medication Dose Route Frequency Provider Last Rate Last Admin   . 0.9 %  sodium chloride infusion  250 mL Intravenous Continuous Elwyn Reach, MD   Paused at 03/11/2020 0905  . 0.9 % NaCl with KCl 20 mEq/ L  infusion   Intravenous Continuous Tyler Pita, MD 100 mL/hr at 03/15/2020 1000 Rate Verify at 03/14/2020 1000  . acetaminophen (TYLENOL) tablet 650 mg  650 mg Oral Q6H PRN Elwyn Reach, MD       Or  . acetaminophen (TYLENOL) suppository 650 mg  650 mg Rectal Q6H PRN Gala Romney L, MD      . Ampicillin-Sulbactam (UNASYN) 3 g in sodium chloride 0.9 % 100 mL IVPB  3 g Intravenous Q12H Lu Duffel, RPH 100 mL/hr at 03/21/2020 1000 Rate Verify at 03/21/2020 1000  . calcium carbonate (TUMS - dosed in mg elemental calcium) chewable tablet 500 mg of elemental calcium  500 mg of elemental calcium Oral Q6H PRN Elwyn Reach, MD      . camphor-menthol (SARNA) lotion 1 application  1 application Topical Q6P PRN Elwyn Reach, MD       And  . hydrOXYzine (ATARAX/VISTARIL) tablet 25 mg  25 mg Oral Q8H PRN Gala Romney L, MD      . carbidopa-levodopa (SINEMET IR) 25-100 MG per tablet immediate release 1 tablet  1 tablet Oral TID Sharion Settler, NP   1 tablet at 03/22/20 0129  . chlorhexidine (PERIDEX) 0.12 % solution 15 mL  15 mL Mouth Rinse BID Tyler Pita, MD   15 mL at 03/31/2020 0903  . Chlorhexidine Gluconate Cloth 2 % PADS 6 each  6 each Topical Daily Tyler Pita, MD   6 each at 03/24/2020 0600  . Chlorhexidine Gluconate Cloth 2 % PADS 6 each  6 each Topical Q0600 Tyler Pita, MD   6 each at 04/04/2020 0245  . dextrose 5 % in lactated ringers infusion   Intravenous Continuous Elwyn Reach, MD   Stopped at 03/22/20 0310  . feeding supplement (NEPRO CARB STEADY) liquid 237 mL  237 mL Oral TID PRN Elwyn Reach, MD      . heparin injection 5,000 Units  5,000 Units Subcutaneous Q8H Elwyn Reach, MD   5,000 Units at 04/01/2020 0039  . MEDLINE mouth rinse  15 mL Mouth Rinse q12n4p Tyler Pita, MD   15 mL  at 03/27/2020 1244  . mupirocin ointment (BACTROBAN) 2 % 1 application  1 application Nasal BID Tyler Pita, MD   1 application at 61/95/09 [phone]  . norepinephrine (LEVOPHED) 4mg  in 2110mL premix infusion  2-10 mcg/min Intravenous Titrated Elwyn Reach, MD   Stopped at 03/17/2020 0606  . ondansetron (ZOFRAN) tablet 4 mg  4 mg Oral Q6H PRN Elwyn Reach, MD       Or  . ondansetron (ZOFRAN) injection 4 mg  4 mg Intravenous Q6H PRN Elwyn Reach, MD      .  sorbitol 70 % solution 30 mL  30 mL Oral PRN Gala Romney L, MD      . vancomycin variable dose per unstable renal function (pharmacist dosing)   Does not apply See admin instructions Sharion Settler, NP      . Vitamin D (Ergocalciferol) (DRISDOL) capsule 50,000 Units  50,000 Units Oral Q Isidore Moos, Hassan Rowan, NP         Abtx:  Anti-infectives (From admission, onward)   Start     Dose/Rate Route Frequency Ordered Stop   03/15/2020 0245  vancomycin (VANCOREADY) IVPB 1500 mg/300 mL        1,500 mg 150 mL/hr over 120 Minutes Intravenous  Once 04/04/2020 0237 03/29/2020 0500   03/22/20 2300  azithromycin (ZITHROMAX) 500 mg in sodium chloride 0.9 % 250 mL IVPB  Status:  Discontinued        500 mg 250 mL/hr over 60 Minutes Intravenous Every 24 hours 03/22/20 0318 03/22/20 1300   03/22/20 2200  Ampicillin-Sulbactam (UNASYN) 3 g in sodium chloride 0.9 % 100 mL IVPB        3 g 200 mL/hr over 30 Minutes Intravenous Every 12 hours 03/22/20 1316     03/22/20 1900  ceFEPIme (MAXIPIME) 2 g in sodium chloride 0.9 % 100 mL IVPB  Status:  Discontinued        2 g 200 mL/hr over 30 Minutes Intravenous Every 24 hours 03/24/2020 2227 03/22/20 0332   03/22/20 0600  piperacillin-tazobactam (ZOSYN) IVPB 3.375 g  Status:  Discontinued        3.375 g 100 mL/hr over 30 Minutes Intravenous Every 8 hours 03/22/20 0318 03/22/20 0331   03/22/20 0345  piperacillin-tazobactam (ZOSYN) IVPB 3.375 g  Status:  Discontinued        3.375 g 12.5 mL/hr over 240  Minutes Intravenous Every 12 hours 03/22/20 0332 03/22/20 1300   03/14/2020 2245  vancomycin (VANCOREADY) IVPB 750 mg/150 mL  Status:  Discontinued        750 mg 150 mL/hr over 60 Minutes Intravenous  Once 04/09/2020 2231 04/02/2020 0236   04/05/2020 2235  vancomycin variable dose per unstable renal function (pharmacist dosing)         Does not apply See admin instructions 03/12/2020 2235     03/20/2020 2045  azithromycin (ZITHROMAX) 500 mg in sodium chloride 0.9 % 250 mL IVPB        500 mg 250 mL/hr over 60 Minutes Intravenous  Once 04/08/2020 2040 03/22/20 0049   03/19/2020 1745  vancomycin (VANCOCIN) IVPB 1000 mg/200 mL premix        1,000 mg 200 mL/hr over 60 Minutes Intravenous  Once 03/30/2020 1737 03/30/2020 2145   03/27/2020 1745  cefTRIAXone (ROCEPHIN) 2 g in sodium chloride 0.9 % 100 mL IVPB        2 g 200 mL/hr over 30 Minutes Intravenous  Once 03/27/2020 1737 03/18/2020 1945      REVIEW OF SYSTEMS:  NA Objective:  VITALS:  BP (!) 103/56   Pulse 93   Temp 98.9 F (37.2 C) (Axillary)   Resp (!) 25   Ht 5\' 11"  (1.803 m)   Wt 74 kg   SpO2 95%   BMI 22.75 kg/m  PHYSICAL EXAM:  General: intubated Head: Normocephalic, without obvious abnormality, atraumatic. Eyes: Conjunctivae clear, anicteric sclerae. Pupils are equal ENT cannot examine Neck: Supple,  Back: did not examine Lungs: b/l air entry crepts Heart: Tachycardia Abdomen: Soft, non-tender,not distended. Bowel sounds normal. No masses Extremities: atraumatic, no  cyanosis. No edema. No clubbing Skin: No rashes or lesions. Or bruising Lymph: Cervical, supraclavicular normal. Neurologic: cannot assess  Pertinent Labs Lab Results CBC    Component Value Date/Time   WBC 17.9 (H) 03/27/2020 0441   RBC 3.38 (L) 03/26/2020 0441   HGB 10.6 (L) 03/11/2020 0441   HGB 13.0 10/15/2019 1617   HCT 28.9 (L) 03/15/2020 0441   HCT 39.0 10/15/2019 1617   PLT 49 (L) 03/19/2020 0441   PLT 190 10/15/2019 1617   MCV 85.5 03/14/2020 0441   MCV  93 10/15/2019 1617   MCH 31.4 03/29/2020 0441   MCHC 36.7 (H) 04/09/2020 0441   RDW 15.3 03/17/2020 0441   RDW 12.3 10/15/2019 1617   LYMPHSABS 0.3 (L) 04/02/2020 1744   LYMPHSABS 1.2 10/15/2019 1617   MONOABS 0.2 03/22/2020 1744   EOSABS 0.0 04/03/2020 1744   EOSABS 0.1 10/15/2019 1617   BASOSABS 0.1 03/16/2020 1744   BASOSABS 0.0 10/15/2019 1617    CMP Latest Ref Rng & Units 03/15/2020 03/22/2020 03/20/2020  Glucose 70 - 99 mg/dL 61(L) 179(H) 115(H)  BUN 8 - 23 mg/dL 85(H) 85(H) 85(H)  Creatinine 0.61 - 1.24 mg/dL 3.09(H) 3.94(H) 4.01(H)  Sodium 135 - 145 mmol/L 140 136 134(L)  Potassium 3.5 - 5.1 mmol/L 3.5 3.3(L) 3.4(L)  Chloride 98 - 111 mmol/L 110 101 97(L)  CO2 22 - 32 mmol/L 16(L) 19(L) 23  Calcium 8.9 - 10.3 mg/dL 7.8(L) 7.8(L) 8.4(L)  Total Protein 6.5 - 8.1 g/dL - 4.8(L) 5.9(L)  Total Bilirubin 0.3 - 1.2 mg/dL - 1.8(H) 1.8(H)  Alkaline Phos 38 - 126 U/L - 78 94  AST 15 - 41 U/L - 93(H) 98(H)  ALT 0 - 44 U/L - 17 20      Microbiology: Recent Results (from the past 240 hour(s))  Blood Culture (routine x 2)     Status: Abnormal (Preliminary result)   Collection Time: 03/13/2020  5:43 PM   Specimen: BLOOD  Result Value Ref Range Status   Specimen Description   Final    BLOOD LEFT FORE ARM Performed at [facility], [address], [place], [place] [address]    Special Requests   Final    BOTTLES DRAWN AEROBIC AND ANAEROBIC Blood Culture adequate volume Performed at [facility], [address], [place], [place] [address]    Culture  Setup Time   Final    GRAM POSITIVE COCCI IN BOTH AEROBIC AND ANAEROBIC BOTTLES CRITICAL VALUE NOTED.  VALUE IS CONSISTENT WITH PREVIOUSLY REPORTED AND CALLED VALUE. Performed at [facility], [address] [place],  [address]    Culture STAPHYLOCOCCUS AUREUS (A)  Final   Report Status PENDING  Incomplete  SARS Coronavirus 2 by RT PCR (hospital order, performed in [facility] hospital lab)  Nasopharyngeal Nasopharyngeal Swab     Status: None   Collection Time: 04/03/2020  5:43 PM   Specimen: Nasopharyngeal Swab  Result Value Ref Range Status   SARS Coronavirus 2 NEGATIVE NEGATIVE Final    Comment: (NOTE) SARS-CoV-2 target nucleic acids are NOT DETECTED.  The SARS-CoV-2 RNA is generally detectable in upper and lower respiratory specimens during the acute phase of infection. The lowest concentration of SARS-CoV-2 viral copies this assay can detect is 250 copies / mL. A negative result does not preclude SARS-CoV-2 infection and should not be used as the sole basis for treatment or other patient management decisions.  A negative result may occur with improper specimen collection / handling, submission of  specimen other than nasopharyngeal swab, presence of viral mutation(s) within the areas targeted by this assay, and inadequate number of viral copies (<250 copies / mL). A negative result must be combined with clinical observations, patient history, and epidemiological information.  Fact Sheet for Patients:   StrictlyIdeas.no  Fact Sheet for Healthcare Providers: BankingDealers.co.za  This test is not yet approved or  cleared by the [place] FDA and has been authorized for detection and/or diagnosis of SARS-CoV-2 by FDA under an Emergency Use Authorization (EUA).  This EUA will remain in effect (meaning this test can be used) for the duration of the COVID-19 declaration under Section 564(b)(1) of the Act, 21 U.S.C. section 360bbb-3(b)(1), unless the authorization is terminated or revoked sooner.  Performed at [facility], [address] [place], [place] [address]   Blood Culture (routine x 2)     Status: Abnormal (Preliminary result)   Collection Time: 03/18/2020  5:44 PM   Specimen: BLOOD  Result Value Ref Range Status   Specimen Description   Final    BLOOD RIGHT FORE ARM Performed at [facility], [address], [place], [place] [address]    Special Requests   Final    BOTTLES DRAWN AEROBIC AND ANAEROBIC Blood Culture adequate volume Performed at [facility], [address] [place], [place] [address]    Culture  Setup Time   Final    Organism ID to follow [place] AND ANAEROBIC BOTTLES CRITICAL RESULT CALLED TO, READ BACK BY AND VERIFIED WITH: DAVID BESANTI AT 0607 03/22/20 [place] Performed at [facility], [address] [place], [place] [address]    Culture STAPHYLOCOCCUS AUREUS (A)  Final   Report Status PENDING  Incomplete  Blood Culture ID Panel (Reflexed)     Status: Abnormal   Collection Time: 03/24/2020  5:44 PM  Result Value Ref Range Status   Enterococcus faecalis NOT DETECTED NOT DETECTED Final   Enterococcus Faecium NOT DETECTED NOT DETECTED Final   Listeria monocytogenes NOT DETECTED NOT DETECTED Final   Staphylococcus species DETECTED (A) NOT DETECTED Final    Comment: CRITICAL RESULT CALLED TO, READ BACK BY AND VERIFIED WITH:  DAVID BESANTI AT 0607 03/22/20 SDR    Staphylococcus aureus (BCID) DETECTED (A) NOT DETECTED Final    Comment: Methicillin (oxacillin)-resistant Staphylococcus aureus (MRSA). MRSA is predictably resistant to beta-lactam antibiotics (except ceftaroline). Preferred therapy is vancomycin unless clinically contraindicated. Patient requires contact precautions if  hospitalized. CRITICAL RESULT CALLED TO, READ BACK BY AND VERIFIED WITH:  DAVID BESANTI AT 0607 03/22/20 SDR    Staphylococcus epidermidis NOT DETECTED NOT DETECTED Final   Staphylococcus lugdunensis NOT DETECTED NOT DETECTED Final   Streptococcus species NOT DETECTED NOT DETECTED Final   Streptococcus agalactiae NOT DETECTED NOT DETECTED Final   Streptococcus pneumoniae NOT DETECTED NOT DETECTED Final   Streptococcus pyogenes NOT DETECTED NOT DETECTED Final   A.calcoaceticus-baumannii NOT DETECTED NOT DETECTED Final   Bacteroides  fragilis NOT DETECTED NOT DETECTED Final   Enterobacterales NOT DETECTED NOT DETECTED Final   Enterobacter cloacae complex NOT DETECTED NOT DETECTED Final   Escherichia coli NOT DETECTED NOT DETECTED Final   Klebsiella aerogenes NOT DETECTED NOT DETECTED Final   Klebsiella oxytoca NOT DETECTED NOT DETECTED Final   Klebsiella pneumoniae NOT DETECTED NOT DETECTED Final   Proteus species NOT DETECTED NOT DETECTED Final   Salmonella species NOT DETECTED NOT DETECTED Final   Serratia marcescens NOT DETECTED NOT DETECTED Final  Haemophilus influenzae NOT DETECTED NOT DETECTED Final   Neisseria meningitidis NOT DETECTED NOT DETECTED Final   Pseudomonas aeruginosa NOT DETECTED NOT DETECTED Final   Stenotrophomonas maltophilia NOT DETECTED NOT DETECTED Final   Candida albicans NOT DETECTED NOT DETECTED Final   Candida auris NOT DETECTED NOT DETECTED Final   Candida glabrata NOT DETECTED NOT DETECTED Final   Candida krusei NOT DETECTED NOT DETECTED Final   Candida parapsilosis NOT DETECTED NOT DETECTED Final   Candida tropicalis NOT DETECTED NOT DETECTED Final   Cryptococcus neoformans/gattii NOT DETECTED NOT DETECTED Final   Meth resistant mecA/C and MREJ DETECTED (A) NOT DETECTED Final    Comment: CRITICAL RESULT CALLED TO, READ BACK BY AND VERIFIED WITH:  DAVID BESANTI AT 0607 03/22/20 SDR Performed at [facility], [address], [place], [place] [address]   Urine culture     Status: Abnormal   Collection Time: 04/09/2020  6:52 PM   Specimen: Urine, Random  Result Value Ref Range Status   Specimen Description   Final    URINE, RANDOM Performed at [facility], [address], [place], [place] [address]    Special Requests   Final    NONE Performed at [facility], [address] [place], [place] [address]    Culture MULTIPLE SPECIES PRESENT, SUGGEST RECOLLECTION (A)  Final   Report Status 03/27/2020 FINAL  Final  MRSA PCR Screening     Status: Abnormal     Collection Time: 03/22/20  8:30 AM   Specimen: Nasopharyngeal  Result Value Ref Range Status   MRSA by PCR POSITIVE (A) NEGATIVE Final    Comment:        The GeneXpert MRSA Assay (FDA approved for NASAL specimens only), is one component of a comprehensive MRSA colonization surveillance program. It is not intended to diagnose MRSA infection nor to guide or monitor treatment for MRSA infections. RESULT CALLED TO, READ BACK BY AND VERIFIED WITH:  [facility] WHITE AT 1007 03/22/20 SDR Performed at [facility], [address] [place], [place] [address]     IMAGING RESULTS:  I have personally reviewed the films ? Impression/Recommendation ? ?MRSA bacteremia Bilateral cavitating infiltrates in the lung concerns concerning for septic emboli with MRSA. Patient will need 2D echo/TEE We will repeat cultures Patient was on Vanco and Zosyn and Zithromax.  Zithromax was stopped and Zosyn changed to Unasyn.   Sacral decubitus -unstageable-  Patient has been seen by surgery and underwent debridement- stage IV. Culture sent On low dose levophed Intubated post surgery  AKI- due to infection- improving  Anemia     Parkinsons disease on carbidopa-levodopa ___________________________________________________ Discussed with his nurse Note:  This document was prepared using Dragon voice recognition software and may include unintentional dictation errors.

## 2020-03-24 ENCOUNTER — Encounter: Payer: Self-pay | Admitting: General Surgery

## 2020-03-24 DIAGNOSIS — L89154 Pressure ulcer of sacral region, stage 4: Secondary | ICD-10-CM

## 2020-03-24 DIAGNOSIS — R7881 Bacteremia: Secondary | ICD-10-CM

## 2020-03-24 DIAGNOSIS — A4102 Sepsis due to Methicillin resistant Staphylococcus aureus: Principal | ICD-10-CM

## 2020-03-24 DIAGNOSIS — B9562 Methicillin resistant Staphylococcus aureus infection as the cause of diseases classified elsewhere: Secondary | ICD-10-CM

## 2020-03-24 DIAGNOSIS — R6520 Severe sepsis without septic shock: Secondary | ICD-10-CM

## 2020-03-24 LAB — CULTURE, BLOOD (ROUTINE X 2)
Special Requests: ADEQUATE
Special Requests: ADEQUATE

## 2020-03-24 LAB — BPAM PLATELET PHERESIS
Blood Product Expiration Date: 202109142359
ISSUE DATE / TIME: 202109131229
Unit Type and Rh: 5100

## 2020-03-24 LAB — PREPARE PLATELET PHERESIS: Unit division: 0

## 2020-03-24 LAB — QUANTIFERON-TB GOLD PLUS (RQFGPL)
QuantiFERON Mitogen Value: 0.03 IU/mL
QuantiFERON Nil Value: 0 IU/mL
QuantiFERON TB1 Ag Value: 0 IU/mL
QuantiFERON TB2 Ag Value: 0.01 IU/mL

## 2020-03-24 LAB — GLUCOSE, CAPILLARY
Glucose-Capillary: 78 mg/dL (ref 70–99)
Glucose-Capillary: 60 mg/dL — ABNORMAL LOW (ref 70–99)
Glucose-Capillary: 81 mg/dL (ref 70–99)
Glucose-Capillary: 101 mg/dL — ABNORMAL HIGH (ref 70–99)
Glucose-Capillary: 59 mg/dL — ABNORMAL LOW (ref 70–99)

## 2020-03-24 LAB — QUANTIFERON-TB GOLD PLUS: QuantiFERON-TB Gold Plus: UNDETERMINED — AB

## 2020-03-24 LAB — VANCOMYCIN, RANDOM: Vancomycin Rm: 16

## 2020-03-24 MED ORDER — VASOPRESSIN 20 UNITS/100 ML INFUSION FOR SHOCK
0.0000 [IU]/min | INTRAVENOUS | Status: DC
  Administered 2020-03-24 – 2020-03-25 (×2): 0.03 [IU]/min via INTRAVENOUS
  Administered 2020-03-27: 0.02 [IU]/min via INTRAVENOUS
  Filled 2020-03-24 (×4): qty 100

## 2020-03-24 MED ORDER — DEXTROSE 50 % IV SOLN
INTRAVENOUS | Status: AC
  Administered 2020-03-24: 12.5 g via INTRAVENOUS
  Filled 2020-03-24: qty 50

## 2020-03-24 MED ORDER — LORAZEPAM 2 MG/ML IJ SOLN
INTRAMUSCULAR | Status: AC
  Administered 2020-03-24: 4 mg via INTRAVENOUS
  Filled 2020-03-24: qty 2

## 2020-03-24 MED ORDER — VANCOMYCIN HCL 1250 MG/250ML IV SOLN
1250.0000 mg | Freq: Once | INTRAVENOUS | Status: AC
  Administered 2020-03-24: 1250 mg via INTRAVENOUS
  Filled 2020-03-24: qty 250

## 2020-03-24 MED ORDER — DEXTROSE 50 % IV SOLN: 0.5000 | Freq: Once | INTRAVENOUS | Status: AC

## 2020-03-24 MED ORDER — DEXTROSE 50 % IV SOLN: 12.5000 g | INTRAVENOUS | Status: AC

## 2020-03-24 MED ORDER — LORAZEPAM 2 MG/ML IJ SOLN: 4.0000 mg | Freq: Once | INTRAMUSCULAR | Status: AC

## 2020-03-24 MED ORDER — DEXTROSE 50 % IV SOLN
INTRAVENOUS | Status: AC
  Administered 2020-03-24: 25 mL via INTRAVENOUS
  Filled 2020-03-24: qty 50

## 2020-03-24 MED ORDER — PHENTOLAMINE MESYLATE 5 MG IJ SOLR
5.0000 mg | Freq: Once | INTRAMUSCULAR | Status: AC
  Administered 2020-03-24: 5 mg via SUBCUTANEOUS
  Filled 2020-03-24: qty 5

## 2020-03-24 MED ORDER — NITROGLYCERIN 2 % TD OINT
1.0000 [in_us] | TOPICAL_OINTMENT | Freq: Three times a day (TID) | TRANSDERMAL | Status: AC
  Administered 2020-03-24 – 2020-03-26 (×6): 1 [in_us] via TOPICAL
  Filled 2020-03-24 (×5): qty 1

## 2020-03-24 MED ORDER — ORAL CARE MOUTH RINSE
15.0000 mL | OROMUCOSAL | Status: DC
  Administered 2020-03-24 – 2020-03-28 (×51): 15 mL via OROMUCOSAL

## 2020-03-24 MED ORDER — DEXTROSE 10 % IV SOLN: INTRAVENOUS | Status: DC

## 2020-03-24 MED ORDER — SODIUM CHLORIDE 0.9 % IV SOLN
3.0000 g | Freq: Four times a day (QID) | INTRAVENOUS | Status: DC
  Administered 2020-03-24 – 2020-03-28 (×16): 3 g via INTRAVENOUS
  Filled 2020-03-24: qty 8
  Filled 2020-03-24 (×3): qty 3
  Filled 2020-03-24: qty 8
  Filled 2020-03-24 (×3): qty 3
  Filled 2020-03-24: qty 8
  Filled 2020-03-24 (×6): qty 3
  Filled 2020-03-24: qty 8
  Filled 2020-03-24 (×3): qty 3

## 2020-03-24 NOTE — Progress Notes (Signed)
Hypoglycemic Event  CBG: 59 mg/dL  Treatment: 1/2 Amp d50W  Symptoms: Tachycardia  Follow-up CBG: Time: 1720 CBG Result: 101  Possible Reasons for Event: Sepsis  Comments/MD notified: Dr. Mortimer Fries notified. D10W infusion started.    [place]

## 2020-03-24 NOTE — Progress Notes (Addendum)
Pharmacy Antibiotic Note  [name] is a 64 y.o. male admitted on 04/08/2020 with pneumonia/staph aureus bacteremia/ sacral decubitus.  Pharmacy has been consulted for vanc/unasyn dosing. ID is following.  Today, 03/24/2020 Day #3 antibiotics: vancomycin, also on ampicillin/sulbactam  Tm 100.3  WBC 14.8 on last check  SCr improving on last check, recheck in am  Random vancomycin level today at 02:44 = 16 mcg/mL. Last vanco dose 1500mg  9/13 at 03:00  MRSA bacteremia,  Repeat blood cultures pending  OR cultures (bone and tissue): Pending (Gram stain GPC pairs, GNR)  Plan:  Vancomycin 1250mg  IV x 1 today based on random level drawn ~11h ago  Check SCr in am  Hopeful can start routine maintenance dose 9/15  Ampicillin/Sulbactam, increase dose to 3gm IV q6h per current renal function  Follow-up OR cultures and repeat blood cultures   Height: 5' 10.98" (180.3 cm) Weight: 76.8 kg (169 lb 5 oz) IBW/kg (Calculated) : 75.26  Temp (24hrs), Avg:98.7 F (37.1 C), Min:97.3 F (36.3 C), Max:100.3 F (37.9 C)  Recent Labs  Lab 04/01/2020 1744 03/22/20 0106 03/22/20 0517 03/22/20 2018 03/27/2020 0441 03/16/2020 1346 04/05/2020 1922 04/04/2020 2211 03/24/20 0244  WBC 10.3  --  11.5*  --  17.9* 17.8* 14.8*  --   --   CREATININE 4.01*  --  3.94*  --  3.09*  --  2.42*  --   --   LATICACIDVEN 2.7* 3.2* 4.3*  --   --   --  2.4* 2.2*  --   VANCORANDOM  --   --   --  8  --   --   --   --  16    Estimated Creatinine Clearance: 32.4 mL/min (A) (by C-G formula based on SCr of 2.42 mg/dL (H)).    No Known Allergies  Vancomycin 9/11 >> Zosyn 9/12 >> 9/12 Unasyn 9/13 >>  9/12 MRSA PCR: + 9/11 BCID: MRSA 9/11 Bcx: 4/4 bottled MRSA 9/11 Ucx: Multiple spp 9/11 SARS-CoV-2: Negative 9/13 Bcx: 9/13 Bone cx: 9/13 Wound cx:   9/13 Random level = 8 mcg/ml (patient given 1gm x 1, additional 750mg  x 1 not given) 9/14 Random level at 0244 = 16 mcg/ml s/p vanco 1.5gm x 1 9/13 at  03:00   Thank you for allowing pharmacy to be a part of this patient's care.  Doreene Eland, PharmD, BCPS.   Work Cell: [phone] 03/24/2020 1:34 PM

## 2020-03-24 NOTE — Consult Note (Signed)
Consultation Note Date: 03/24/2020   Patient Name: [name]  DOB: Nov 19, 1955  MRN: [id number]  Age / Sex: 64 y.o., male  PCP: Doreen Beam, FNP Referring Physician: Flora Lipps, MD  Reason for Consultation: Establishing goals of care  HPI/Patient Profile: [name] is a 64 y.o. male with medical history significant of Parkinson's disease, malnutrition, prosthetic hypertrophy, previous CVA, depression, hyperlipidemia and anxiety disorder who has apparently been weak decreased appetite and depressed.    Clinical Assessment and Goals of Care: Patient is resting in bed. His wife [name] and grandson are at bedside. Wife shares that she and her husband raised her grandson, and notes that he is the patient's step grandson. She tells me the patient and grandson "are very close, and he is protective over him".  She states they are currently in the middle of a very time sensitive decision of whether or not to take [name] to the OR for debridement, or if they should focus on comfort. [name] states she and her husband have had conversations recently, and he "is ready to walk the streets [place]." She states their grandson wants to continue life prolonging measures. Attempted to speak with patient, but he does not answer questions.   We discussed, diagnosis, prognosis, GOC, EOL wishes disposition and options. We discussed QOL. Yolanda Bonine tells me patient has discussed just wanting to go home and be at home. He states "I know I'm being selfish" and states "I can't give up on him." He tells me he understands comfort vs life prolonging care, but needs more time, and to speak with his grandfather. Chaplain in to bedside for support. Patient's wife understands she is [name]'s decision maker, and knows her husband's feelings, but is deferring to their grandson to make decisions.    SUMMARY OF RECOMMENDATIONS     Wife is surrogate decision maker, and feels her husband is ready to focus on comfort based care, but is deferring to her grandson who wants to continue current care.   Prognosis:   Very poor.       Primary Diagnoses: Present on Admission: . AKI (acute kidney injury) ([place]) . Hyperlipidemia . Protein-calorie malnutrition, severe ([place]) . MDD (major depressive disorder), recurrent episode, severe ([place]) . Primary Parkinsonism ([place]) . Hyponatremia . Hypokalemia . FTT (failure to thrive) in adult . Thrombocytopenia ([place]) . Community acquired pneumonia . Sepsis ([place]) . Sacral decubitus ulcer, stage IV ([place]) . Severe sepsis ([place])   I have reviewed the medical record, interviewed the patient and family, and examined the patient. The following aspects are pertinent.  Past Medical History:  Diagnosis Date  . AKI (acute kidney injury) ([place]) 03/27/2020  . Anxiety   . Cataract   . Depression   . Hyperlipidemia 01/10/2019  . Idiopathic peripheral neuropathy 12/25/2018  . Prostate disease   . Stroke [facility])    Social History   Socioeconomic History  . Marital status: Married    Spouse name: Reitta  . Number of children: 1  . Years  of education: Not on file  . Highest education level: Not on file  Occupational History  . Occupation: retired  Tobacco Use  . Smoking status: Former Research scientist (life sciences)  . Smokeless tobacco: Former Systems developer    Quit date: 01/10/2017  Vaping Use  . Vaping Use: Never used  Substance and Sexual Activity  . Alcohol use: Not Currently  . Drug use: Not Currently  . Sexual activity: Not Currently  Other Topics Concern  . Not on file  Social History Narrative  . Not on file   Social Determinants of Health   Financial Resource Strain: Low Risk   . Difficulty of Paying Living Expenses: Not hard at all  Food Insecurity: No Food Insecurity  . Worried About Charity fundraiser in the Last Year: Never true  . Ran Out of Food in the Last Year: Never true  Transportation  Needs: No Transportation Needs  . Lack of Transportation (Medical): No  . Lack of Transportation (Non-Medical): No  Physical Activity: Inactive  . Days of Exercise per Week: 0 days  . Minutes of Exercise per Session: 0 min  Stress: Stress Concern Present  . Feeling of Stress : To some extent  Social Connections: Socially Isolated  . Frequency of Communication with Friends and Family: Once a week  . Frequency of Social Gatherings with Friends and Family: Three times a week  . Attends Religious Services: Never  . Active Member of Clubs or Organizations: No  . Attends Archivist Meetings: Never  . Marital Status: Separated   Family History  Problem Relation Age of Onset  . Depression Father    Scheduled Meds: . carbidopa-levodopa  1 tablet Oral TID  . chlorhexidine  15 mL Mouth Rinse BID  . Chlorhexidine Gluconate Cloth  6 each Topical Daily  . Chlorhexidine Gluconate Cloth  6 each Topical Q0600  . heparin  5,000 Units Subcutaneous Q8H  . mouth rinse  15 mL Mouth Rinse Q2H  . mupirocin ointment  1 application Nasal BID  . nitroGLYCERIN  1 inch Topical Q8H  . vancomycin variable dose per unstable renal function (pharmacist dosing)   Does not apply See admin instructions  . Vitamin D (Ergocalciferol)  50,000 Units Oral Q Mon   Continuous Infusions: . sodium chloride 200 mL/hr at 03/22/2020 1832  . 0.9 % NaCl with KCl 20 mEq / L 100 mL/hr at 03/24/20 1100  . ampicillin-sulbactam (UNASYN) IV 3 g (03/24/20 1149)  . fentaNYL infusion INTRAVENOUS 75 mcg/hr (03/24/20 1100)  . norepinephrine (LEVOPHED) Adult infusion 15 mcg/min (03/24/20 1100)   PRN Meds:.acetaminophen **OR** acetaminophen, calcium carbonate, camphor-menthol **AND** hydrOXYzine, feeding supplement (NEPRO CARB STEADY), ondansetron **OR** ondansetron (ZOFRAN) IV, sorbitol Medications Prior to Admission:  Prior to Admission medications   Medication Sig Start Date End Date Taking? Authorizing Provider    carbidopa-levodopa (SINEMET IR) 25-100 MG tablet Take 1 tablet by mouth 3 (three) times daily. Crush and put in food. 12/12/19  Yes [provider]  Cholecalciferol (VITAMIN D3) 1.25 MG (50000 UT) CAPS Take 50,000 Units by mouth every Monday.  09/13/19  Yes [provider]  PRESCRIPTION MEDICATION Place 1 patch onto the skin daily as needed (wound care). Silvercel patches   Yes [provider]   No Known Allergies Review of Systems  Unable to perform ROS   Physical Exam Pulmonary:     Effort: Pulmonary effort is normal.  Neurological:     Mental Status: He is alert.     Vital Signs:  BP (!) 88/59   Pulse (!) 135   Temp 100.1 F (37.8 C) (Axillary)   Resp 18   Ht 5' 10.98" (1.803 m)   Wt 76.8 kg   SpO2 94%   BMI 23.63 kg/m  Pain Scale: CPOT   Pain Score: 0-No pain   SpO2: SpO2: 94 % O2 Device:SpO2: 94 % O2 Flow Rate: .O2 Flow Rate (L/min): 3 L/min  IO: Intake/output summary:   Intake/Output Summary (Last 24 hours) at 03/24/2020 1226 Last data filed at 03/24/2020 1211 Gross per 24 hour  Intake 2428.75 ml  Output 1050 ml  Net 1378.75 ml    LBM:   Baseline Weight: Weight: 68 kg Most recent weight: Weight: 76.8 kg     Palliative Assessment/Data:     Time In: 4:30 Time Out: 5:00 Time Total: 30 min Greater than 50%  of this time was spent counseling and coordinating care related to the above assessment and plan.  Signed by: Asencion Gowda, NP   Please contact Palliative Medicine Team phone at [phone] for questions and concerns.  For individual provider: See Shea Evans

## 2020-03-24 NOTE — Plan of Care (Signed)
  Problem: Health Behavior/Discharge Planning: Goal: Ability to manage health-related needs will improve Outcome: Not Progressing   Problem: Clinical Measurements: Goal: Ability to maintain clinical measurements within normal limits will improve Outcome: Not Progressing Goal: Will remain free from infection Outcome: Not Progressing Goal: Diagnostic test results will improve Outcome: Not Progressing Goal: Respiratory complications will improve Outcome: Not Progressing Goal: Cardiovascular complication will be avoided Outcome: Not Progressing   Problem: Activity: Goal: Risk for activity intolerance will decrease Outcome: Not Progressing   Problem: Nutrition: Goal: Adequate nutrition will be maintained Outcome: Not Progressing   Problem: Coping: Goal: Level of anxiety will decrease Outcome: Not Progressing   Problem: Elimination: Goal: Will not experience complications related to bowel motility Outcome: Not Progressing Goal: Will not experience complications related to urinary retention Outcome: Not Progressing   Problem: Pain Managment: Goal: General experience of comfort will improve Outcome: Not Progressing   Problem: Safety: Goal: Ability to remain free from injury will improve Outcome: Not Progressing   Problem: Skin Integrity: Goal: Risk for impaired skin integrity will decrease Outcome: Not Progressing   

## 2020-03-24 NOTE — Op Note (Signed)
  Procedure Date:  03/24/2020  Pre-operative Diagnosis:  Sepsis  Post-operative Diagnosis: Sepsis  Procedure:  Right femoral triple lumen central line placement  Surgeon:  Melvyn Neth, MD  Anesthesia:  Patient was already sedated and intubated  Estimated Blood Loss:  5 ml  Specimens:  None  Complications:  None   Indications for Procedure:  This is a 64 y.o. male admitted with sepsis with MRSA bacteremia and infected decubitus wound.  He just lost IV access and requires pressors.  I was at bedside for dressing change for his decubitus wound and am available for central line placement.  This is an emergent procedure and implied consent was done with members of the ICU team.  Description of Procedure: The patient was correctly identified at bedside.  Appropriate timeouts were performed.  The patient was supine in Trendelenburg position.  The patient's right groin was prepped and draped in usual sterile fashion.  Using ultrasound guidance, the right common femoral vein and artery were identified.  Using large bore hollow needle, the right femoral vein was accessed.  Guidewire was inserted and Seldinger technique was used to insert a triple lumen central line catheter.  The ports had been previously flushed and were flushed again upon insertion with good return of blood.  The catheter was secured to the right groin using 2-0 Silk sutures.  BioPatch was placed, and sterile dressing was placed.  The patient tolerated the procedure well and all sharps were appropriately disposed of at the end of the case.   Melvyn Neth, MD

## 2020-03-24 NOTE — Progress Notes (Addendum)
Shift summary:  - Patient remains intubated, sedated, on pressors via peripheral IV.  - Upon AM exam, patient's Levophed infusion appears to have extravasated. Infusion stopped. MD and Pharmacist notified. PIV removed. Extremity elevated. Area of pale discoloration outlined. See MAR for additional interventions.  - Nursing unable to re-establish peripheral access. Surgery at bedside to emergently place a CVC.  - MD changed dressing at bedside this evening. Patient began exhibiting apparent seizure symptoms; resolved after 4mg  IV ativan. Duration ~ 3 mins.

## 2020-03-24 NOTE — Progress Notes (Signed)
03/24/20  Patient now on two pressors, tachycardic, but relatively stable at this point.  Was able to turn patient and do dressing change with wet to dry gauze.  Wound bed with some areas of oozing.  The mid portion with bone exposed, showing some mild necrosis of the fascia/ligament surrounding, but no overt necrosis of fatty or muscular tissue.  While he was turned, he developed twitching movement of the face and his neck was more stiff, concern for possible seizure like activity.  Dressing finished and turned back supine.  He's on add-on schedule for tomorrow as a precaution, but may not be stable enough to take to the OR tomorrow and may need to do wet to dry or wound vac at bedside instead.  Olean Ree, MD

## 2020-03-24 NOTE — Progress Notes (Addendum)
Daily Progress Note   Patient Name: [name]       Date: 03/24/2020 DOB: [birth year]  Age: 64 y.o. MRN#: [id number] Attending Physician: Flora Lipps, MD Primary Care Physician: Doreen Beam, FNP Admit Date: 04/07/2020  Reason for Consultation/Follow-up: Establishing goals of care  Subjective: Patient is resting in bed on ventilator. Spoke with wife. She states she and Harel have been separated for 2 years; she states she had to leave when he became anxious and depressed. She tells me grandson [name] continued to stay with him until he moved out 4 months ago to live with his girlfriend. She states patient stayed in his apartment and only left for doctor's appointments, and they no longer trust her since she moved out. He spent a lot of time in the chair and the bed. She states [name] has been supportive and has helped as he could.   Mrs. Olivares states she and [name] are trying to make decisions together, but she wants comfort and [name] wants to continue life prolonging interventions. She does tell me that yesterday [name] asked the patient if he wanted the surgery to try to stay on earth longer to be there for him and he said "yes". She states she will remain the decision maker, but will abide by [name]'s decisions. She is unable to provide a timeline for if/when she would change paths if they continue to disagree.  Continue to treat the treatable.     Length of Stay: 3  Current Medications: Scheduled Meds:  . carbidopa-levodopa  1 tablet Oral TID  . chlorhexidine  15 mL Mouth Rinse BID  . Chlorhexidine Gluconate Cloth  6 each Topical Daily  . Chlorhexidine Gluconate Cloth  6 each Topical Q0600  . heparin  5,000 Units Subcutaneous Q8H  . mouth rinse  15 mL Mouth Rinse Q2H  . mupirocin  ointment  1 application Nasal BID  . nitroGLYCERIN  1 inch Topical Q8H  . vancomycin variable dose per unstable renal function (pharmacist dosing)   Does not apply See admin instructions  . Vitamin D (Ergocalciferol)  50,000 Units Oral Q Mon    Continuous Infusions: . sodium chloride 200 mL/hr at 03/14/2020 1832  . 0.9 % NaCl with KCl 20 mEq / L 100 mL/hr at 03/24/20 1300  . ampicillin-sulbactam (UNASYN) IV    .  fentaNYL infusion INTRAVENOUS 75 mcg/hr (03/24/20 1300)  . norepinephrine (LEVOPHED) Adult infusion 30 mcg/min (03/24/20 1332)  . vancomycin    . vasopressin 0.03 Units/min (03/24/20 1334)    PRN Meds: acetaminophen **OR** acetaminophen, calcium carbonate, camphor-menthol **AND** hydrOXYzine, feeding supplement (NEPRO CARB STEADY), ondansetron **OR** ondansetron (ZOFRAN) IV, sorbitol  Physical Exam Constitutional:      Comments: On ventilator.   Musculoskeletal:     Right lower leg: Edema present.     Left lower leg: Edema present.             Vital Signs: BP (!) 113/54   Pulse (!) 150   Temp 100.1 F (37.8 C) (Axillary)   Resp 18   Ht 5' 10.98" (1.803 m)   Wt 76.8 kg   SpO2 95%   BMI 23.63 kg/m  SpO2: SpO2: 95 % O2 Device: O2 Device: Ventilator O2 Flow Rate: O2 Flow Rate (L/min): 3 L/min  Intake/output summary:   Intake/Output Summary (Last 24 hours) at 03/24/2020 1342 Last data filed at 03/24/2020 1334 Gross per 24 hour  Intake 2851.35 ml  Output 1150 ml  Net 1701.35 ml   LBM:   Baseline Weight: Weight: 68 kg Most recent weight: Weight: 76.8 kg       Palliative Assessment/Data:      Patient Active Problem List   Diagnosis Date Noted  . Hyponatremia 03/22/2020  . Hypokalemia 03/22/2020  . FTT (failure to thrive) in adult 03/22/2020  . Thrombocytopenia ([place]) 03/22/2020  . Community acquired pneumonia 03/22/2020  . Sepsis ([place]) 03/22/2020  . Sacral decubitus ulcer, stage IV ([place]) 03/22/2020  . Severe sepsis ([place]) 03/22/2020  . AKI (acute  kidney injury) ([place]) 04/09/2020  . Bradykinesia 11/22/2019  . Tremor 11/22/2019  . At risk for falling 11/22/2019  . Frequency of urination and polyuria 11/22/2019  . Weakness generalized 11/22/2019  . Primary Parkinsonism ([place]) 11/13/2019  . Hyperlipidemia 01/10/2019  . 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 ([place]) 12/27/2018  . Gait difficulty 12/25/2018  . Idiopathic peripheral neuropathy 12/25/2018  . Benign prostatic hyperplasia with urinary frequency 04/04/2018  . Erectile dysfunction 04/04/2018  . Vitamin D insufficiency 03/14/2018  . Weakness of back 03/14/2018  . Elevated CK 03/14/2018  . B12 deficiency 03/14/2018  . High risk medications (not anticoagulants) long-term use 03/14/2018  . Onychomycosis 03/14/2018  . Catatonia 01/12/2018  . Protein-calorie malnutrition, severe ([place]) 01/09/2018  . Pressure injury of skin 01/09/2018  . MDD (major depressive disorder), recurrent episode, severe ([place]) 01/09/2018  . Major depressive disorder, recurrent severe without psychotic features ([place]) 01/09/2018  . Back pain 01/08/2018  . Shuffling gait 01/08/2018    Palliative Care Assessment & Plan    Recommendations/Plan:  Continue current care.     Code Status:    Code Status Orders  (From admission, onward)         Start     Ordered   03/22/20 1322  Do not attempt resuscitation (DNR)  Continuous       Question Answer Comment  In the event of cardiac or respiratory ARREST Do not call a "code blue"   In the event of cardiac or respiratory ARREST Do not perform Intubation, CPR, defibrillation or ACLS   In the event of cardiac or respiratory ARREST Use medication by any route, position, wound care, and other measures to relive pain and suffering. May use oxygen, suction and manual treatment of airway obstruction as needed for comfort.      03/22/20 1323  Code Status History    Date Active Date Inactive Code Status Order ID  Comments User Context   03/31/2020 2314 03/22/2020 1323 Full Code [id number]  Elwyn Reach, MD ED   01/12/2018 1331 02/08/2018 1731 Full Code [id number]  Gonzella Lex, MD Inpatient   01/08/2018 2350 01/12/2018 1308 Full Code [id number]  Lance Coon, MD Inpatient   Advance Care Planning Activity       Prognosis:  Very poor.    Thank you for allowing the Palliative Medicine Team to assist in the care of this patient.   Time In: 1:00 Time Out: 1:50 Total Time 50 min Prolonged Time Billed no       Greater than 50%  of this time was spent counseling and coordinating care related to the above assessment and plan.  Asencion Gowda, NP  Please contact Palliative Medicine Team phone at [phone] for questions and concerns.

## 2020-03-24 NOTE — Progress Notes (Signed)
Hypoglycemic Event  CBG: 60 mg/dL  Treatment: 1/2 AMP d50w  Symptoms: tachycardia  Follow-up CBG: Time: 1234 hrs CBG Result: 81 mg/dL  Possible Reasons for Event: Sepsis  Comments/MD notified: Dr. Isidor Holts

## 2020-03-24 NOTE — Progress Notes (Signed)
Nutrition Follow-up  DOCUMENTATION CODES:   Severe malnutrition in context of chronic illness  INTERVENTION:  Plan is to hold initiation of tube feeds at this time as patient is on increasing doses of pressors.   Once plan is for initiation of tube feeds recommend initiating Vital 1.5 Cal at 15 mL/hr and advancing by 20 mL/hr every 12 hours to goal rate of 55 mL/hr (1320 mL goal daily volume). Also provide PROSource TF 45 mL BID per tube. Goal regimen provides 2060 kcal, 111 grams of protein, 1003 mL H2O daily.  NUTRITION DIAGNOSIS:   Severe Malnutrition related to chronic illness (parkinson's) as evidenced by moderate fat depletion, severe fat depletion, moderate muscle depletion, severe muscle depletion.  Ongoing.  GOAL:   Patient will meet greater than or equal to 90% of their needs  Progressing.  MONITOR:   Diet advancement, Labs, Weight trends, Skin, I & O's, Other (Comment) (GOC)  REASON FOR ASSESSMENT:   Consult Assessment of nutrition requirement/status  ASSESSMENT:   64 year old male with history of Parkinson's disease, previous stroke, hyperlipidemia, BPH, malnutrition, vitamin D deficiency, severe depression, s/p ECT treatment (2019) and anxiety who is being admitted with working diagnosis of multifocal pneumonia with cavitary lesions, concern for atypical infection, rule out TB, septic shock requiring low-dose vasopressors and severe AKI.  9/13 intubated 9/13 s/p excisional debridement of skin, soft tissue, muscle totaling 208 sq cm + biopsy of sacral bone  Patient is currently intubated on ventilator support MV: 9 L/min Temp (24hrs), Avg:98.8 F (37.1 C), Min:97.3 F (36.3 C), Max:100.3 F (37.9 C)  Medications reviewed and include: NS with KCl 20 mEq/L at 100 mL/hr, Unasyn, fentanyl gtt, norepinephrine gtt at 30 mcg/min, vancomycin, vasopressin gtt.  Labs reviewed: CBG 60-106, Sodium 146, Chloride 114, CO2 20, BUN 83, Creatinine 2.42.  I/O: 1050 mL UOP  yesterday (0.6 mL/kg/hr)  Weight trend: 76.8 kg on 9/14; +1.7 kg on 9/12  Enteral Access: OGT; terminates in gastric body per abdominal x-ray 9/13  Discussed with RN and on rounds. Plan was to start tube feeds today as at time of rounds patient was only on norepinephrine at 10 mcg/min. Now patient on increasing doses of pressors and plan is to hold tube feeds.  Diet Order:   Diet Order            Diet NPO time specified  Diet effective midnight                EDUCATION NEEDS:   Not appropriate for education at this time  Skin:  Skin Assessment: Skin Integrity Issues: Skin Integrity Issues:: Unstageable, Stage I, Incisions Stage I: bilateral buttocks Unstageable: sacrum (9cm x 7.5cm x 0.5cm) Incisions: closed incision to buttocks  Last BM:  Unknown/PTA  Height:   Ht Readings from Last 1 Encounters:  03/24/20 5' 10.98" (1.803 m)   Weight:   Wt Readings from Last 1 Encounters:  03/24/20 76.8 kg   Ideal Body Weight:  78.18 kg  BMI:  Body mass index is 23.63 kg/m.  Estimated Nutritional Needs:   Kcal:  2130  Protein:  105-120g/day  Fluid:  >1.9L/day  Jacklynn Barnacle, MS, RD, LDN Pager number available on Am[facility]

## 2020-03-24 NOTE — Progress Notes (Addendum)
[facility] Day(s): 3.   Post op day(s): 1 Day Post-Op.   Interval History:  Patient seen and examined no acute events or new complaints overnight.  Patient remains intubated and sedated post-operatively He is requiring 4 mcg/min norepinephrine  No new additional labs this morning Cx and bone biopsy from OR yesterday pending He remains on Unasyn and Vancomycin   Patient did lose access this morning and needed emergent central line placement   Vital signs in last 24 hours: [min-max] current  Temp:  [97.3 F (36.3 C)-99.5 F (37.5 C)] 98.3 F (36.8 C) (09/14 0400) Pulse Rate:  [82-115] 114 (09/14 0700) Resp:  [12-38] 16 (09/14 0700) BP: (73-116)/(51-79) 99/58 (09/14 0700) SpO2:  [91 %-99 %] 99 % (09/14 0700) FiO2 (%):  [50 %-55 %] 55 % (09/14 0156) Weight:  [76.8 kg] 76.8 kg (09/14 0500)     Height: 5' 10.98" (180.3 cm) Weight: 76.8 kg BMI (Calculated): 23.62   Intake/Output last 2 shifts:  09/13 0701 - 09/14 0700 In: 1411.4 [I.V.:975.3; Blood:336; IV Piggyback:100] Out: 1050 [Urine:1050]   Physical Exam:  Constitutional: Intubated Respiratory:On ventilator Cardiovascular: regular rate and sinus rhythm  Integumentary: Large sacral wound, packing in place  Labs:  CBC Latest Ref Rng & Units 03/31/2020 03/20/2020 03/25/2020  WBC 4.0 - 10.5 K/uL 14.8(H) 17.8(H) 17.9(H)  Hemoglobin 13.0 - 17.0 g/dL 9.8(L) 9.8(L) 10.6(L)  Hematocrit 39 - 52 % 29.1(L) 28.2(L) 28.9(L)  Platelets 150 - 400 K/uL 51(L) 62(L) 49(L)   CMP Latest Ref Rng & Units 04/02/2020 04/03/2020 03/22/2020  Glucose 70 - 99 mg/dL 105(H) 61(L) 179(H)  BUN 8 - 23 mg/dL 83(H) 85(H) 85(H)  Creatinine 0.61 - 1.24 mg/dL 2.42(H) 3.09(H) 3.94(H)  Sodium 135 - 145 mmol/L 146(H) 140 136  Potassium 3.5 - 5.1 mmol/L 3.5 3.5 3.3(L)  Chloride 98 - 111 mmol/L 114(H) 110 101  CO2 22 - 32 mmol/L 20(L) 16(L) 19(L)  Calcium 8.9 - 10.3 mg/dL 7.7(L) 7.8(L) 7.8(L)  Total Protein 6.5  - 8.1 g/dL - - 4.8(L)  Total Bilirubin 0.3 - 1.2 mg/dL - - 1.8(H)  Alkaline Phos 38 - 126 U/L - - 78  AST 15 - 41 U/L - - 93(H)  ALT 0 - 44 U/L - - 17    Imaging studies: No new pertinent imaging studies   Assessment/Plan:  64 y.o. male intubated, sedated, requiring vasopressor support 1 Day Post-Op s/p Excisional debridment of skin, soft tissue, muscle totaling 208 sq cm; biopsy of sacral bone for unstageable sacral decubitus ulceration   - Central line emergently placed at bedside by Dr Hampton Abbot. Please refer to his procedure note for documentation of this   - We will tentatively plan for return to OR tomorrow for second look +/- wound vac placement pending OR/Anesthesia availability   - Will plan for dressing change later in the day today   - Continue IV Abx (Unasyn + Vancomycin)  - Continue supportive care per PCCM; we will follow    All of the above findings and recommendations were discussed with the medical team.   -- Edison Simon, PA-C [facility] 03/24/2020, 7:39 AM [phone] M-F: 7am - 4pm

## 2020-03-24 NOTE — Progress Notes (Addendum)
CRITICAL CARE NOTE  9/12 admitted for septic shock MRSA bacteremia with sacral decub 9/13 post opresp failure, septic shock and sepsis 9/13 Liquified necrotic fat was encountered.  A pocket of pus was cultured.  excise tissue until all visibly necrotic areas were excised down to bleeding tissue. The sacral bone was exposed.  A rongeur was used to take bone biopsies for culture and histopathology. 9/14 post op resp failure due to septic shock  CC  Follow up septic shock and resp failure  SUBJECTIVE Remains critically ill Prognosis is guarded very poor prognosis and low chance pf meaningful recovery +OSTEO On vent, on pressors High chance of cardiac arrest   BP (!) 99/58   Pulse (!) 114   Temp 98.3 F (36.8 C) (Oral)   Resp 16   Ht 5' 10.98" (1.803 m)   Wt 76.8 kg   SpO2 99%   BMI 23.63 kg/m    I/O last 3 completed shifts: In: 3176.9 [I.V.:2341.2; Blood:336; IV Piggyback:499.8] Out: 5176 [Urine:1660] No intake/output data recorded.  SpO2: 99 % O2 Flow Rate (L/min): 3 L/min FiO2 (%): 55 %  Estimated body mass index is 23.63 kg/m as calculated from the following:   Height as of this encounter: 5' 10.98" (1.803 m).   Weight as of this encounter: 76.8 kg.  SIGNIFICANT EVENTS   REVIEW OF SYSTEMS  PATIENT IS UNABLE TO PROVIDE COMPLETE REVIEW OF SYSTEMS DUE TO SEVERE CRITICAL ILLNESS   Pressure Injury 01/08/18 Stage I -  Intact skin with non-blanchable redness of a localized area usually over a bony prominence. Bilateral unblanchable redness on buttocks (Active)  01/08/18 2351  Location: Buttocks  Location Orientation: Medial;Bilateral  Staging: Stage I -  Intact skin with non-blanchable redness of a localized area usually over a bony prominence.  Wound Description (Comments): Bilateral unblanchable redness on buttocks  Present on Admission: Yes     Pressure Injury 04/08/2020 Sacrum Right;Left;Posterior Unstageable - Full thickness tissue loss in which the base of the  injury is covered by slough (yellow, tan, gray, green or brown) and/or eschar (tan, brown or black) in the wound bed. Pt with necrosis  (Active)  03/19/2020 1744  Location: Sacrum  Location Orientation: Right;Left;Posterior  Staging: Unstageable - Full thickness tissue loss in which the base of the injury is covered by slough (yellow, tan, gray, green or brown) and/or eschar (tan, brown or black) in the wound bed.  Wound Description (Comments): Pt with necrosis on sacral wound   Present on Admission:    PHYSICAL EXAMINATION:  GENERAL:critically ill appearing, +resp distress HEAD: Normocephalic, atraumatic.  EYES: Pupils equal, round, reactive to light.  No scleral icterus.  MOUTH: Moist mucosal membrane. NECK: Supple. No thyromegaly. No nodules. No JVD.  PULMONARY: +rhonchi, +wheezing CARDIOVASCULAR: S1 and S2. Regular rate and rhythm. No murmurs, rubs, or gallops.  GASTROINTESTINAL: Soft, nontender, -distended. Positive bowel sounds.  MUSCULOSKELETAL: No swelling, clubbing, or edema.  NEUROLOGIC: obtunded SKIN:extensive sacral wound      CULTURE RESULTS   Recent Results (from the past 240 hour(s))  Blood Culture (routine x 2)     Status: Abnormal   Collection Time: 04/09/2020  5:43 PM   Specimen: BLOOD  Result Value Ref Range Status   Specimen Description   Final    BLOOD LEFT FORE ARM Performed at [facility], [address] [place], [place] [address]    Special Requests   Final    BOTTLES DRAWN AEROBIC AND ANAEROBIC Blood Culture adequate volume Performed at [facility], [address], [place], [place] [address]    Culture  Setup Time   Final    GRAM POSITIVE COCCI IN BOTH AEROBIC AND ANAEROBIC BOTTLES CRITICAL VALUE NOTED.  VALUE IS CONSISTENT WITH PREVIOUSLY REPORTED AND CALLED VALUE. Performed at [facility], [address] [place], [place] [address]    Culture (A)  Final    STAPHYLOCOCCUS AUREUS SUSCEPTIBILITIES PERFORMED ON  PREVIOUS CULTURE WITHIN THE LAST 5 DAYS. Performed at [facility], [place] [address] [place], [place] [address]    Report Status 03/24/2020 FINAL  Final  SARS Coronavirus 2 by RT PCR (hospital order, performed in [facility] hospital lab) Nasopharyngeal Nasopharyngeal Swab     Status: None   Collection Time: 04/07/2020  5:43 PM   Specimen: Nasopharyngeal Swab  Result Value Ref Range Status   SARS Coronavirus 2 NEGATIVE NEGATIVE Final    Comment: (NOTE) SARS-CoV-2 target nucleic acids are NOT DETECTED.  The SARS-CoV-2 RNA is generally detectable in upper and lower respiratory specimens during the acute phase of infection. The lowest concentration of SARS-CoV-2 viral copies this assay can detect is 250 copies / mL. A negative result does not preclude SARS-CoV-2 infection and should not be used as the sole basis for treatment or other patient management decisions.  A negative result may occur with improper specimen collection / handling, submission of specimen other than nasopharyngeal swab, presence of viral mutation(s) within the areas targeted by this assay, and inadequate number of viral copies (<250 copies / mL). A negative result must be combined with clinical observations, patient history, and epidemiological information.  Fact Sheet for Patients:   StrictlyIdeas.no  Fact Sheet for Healthcare Providers: BankingDealers.co.za  This test is not yet approved or  cleared by the [place] FDA and has been authorized for detection and/or diagnosis of SARS-CoV-2 by FDA under an Emergency Use Authorization (EUA).  This EUA will remain in effect (meaning this test can be used) for the duration of the COVID-19 declaration under Section 564(b)(1) of the Act, 21 U.S.C. section 360bbb-3(b)(1), unless the authorization is terminated or revoked sooner.  Performed at [facility], [address], [place], [place] [address]    Blood Culture (routine x 2)     Status: Abnormal   Collection Time: 04/06/2020  5:44 PM   Specimen: BLOOD  Result Value Ref Range Status   Specimen Description   Final    BLOOD RIGHT FORE ARM Performed at [facility], [address], [place], [place] [address]    Special Requests   Final    BOTTLES DRAWN AEROBIC AND ANAEROBIC Blood Culture adequate volume Performed at [facility], [address], [place], [place] [address]    Culture  Setup Time   Final    Organism ID to follow [place] AEROBIC AND ANAEROBIC BOTTLES CRITICAL RESULT CALLED TO, READ BACK BY AND VERIFIED WITH: DAVID BESANTI AT 0607 03/22/20 [place] Performed at [facility], [address] [place],  [address]    Culture METHICILLIN RESISTANT STAPHYLOCOCCUS AUREUS (A)  Final   Report Status 03/24/2020 FINAL  Final   Organism ID, Bacteria METHICILLIN RESISTANT STAPHYLOCOCCUS AUREUS  Final      Susceptibility   Methicillin resistant staphylococcus aureus - MIC*    CIPROFLOXACIN >=8 RESISTANT Resistant     ERYTHROMYCIN >=8 RESISTANT Resistant     GENTAMICIN <=0.5 SENSITIVE Sensitive     OXACILLIN >=4 RESISTANT Resistant     TETRACYCLINE <=  1 SENSITIVE Sensitive     VANCOMYCIN 1 SENSITIVE Sensitive     TRIMETH/SULFA <=10 SENSITIVE Sensitive     CLINDAMYCIN <=0.25 SENSITIVE Sensitive     RIFAMPIN <=0.5 SENSITIVE Sensitive     Inducible Clindamycin NEGATIVE Sensitive     * METHICILLIN RESISTANT STAPHYLOCOCCUS AUREUS  Blood Culture ID Panel (Reflexed)     Status: Abnormal   Collection Time: 04/03/2020  5:44 PM  Result Value Ref Range Status   Enterococcus faecalis NOT DETECTED NOT DETECTED Final   Enterococcus Faecium NOT DETECTED NOT DETECTED Final   Listeria monocytogenes NOT DETECTED NOT DETECTED Final   Staphylococcus species DETECTED (A) NOT DETECTED Final    Comment: CRITICAL RESULT CALLED TO, READ BACK BY AND VERIFIED WITH:  DAVID BESANTI AT 0607 03/22/20 SDR     Staphylococcus aureus (BCID) DETECTED (A) NOT DETECTED Final    Comment: Methicillin (oxacillin)-resistant Staphylococcus aureus (MRSA). MRSA is predictably resistant to beta-lactam antibiotics (except ceftaroline). Preferred therapy is vancomycin unless clinically contraindicated. Patient requires contact precautions if  hospitalized. CRITICAL RESULT CALLED TO, READ BACK BY AND VERIFIED WITH:  DAVID BESANTI AT 0607 03/22/20 SDR    Staphylococcus epidermidis NOT DETECTED NOT DETECTED Final   Staphylococcus lugdunensis NOT DETECTED NOT DETECTED Final   Streptococcus species NOT DETECTED NOT DETECTED Final   Streptococcus agalactiae NOT DETECTED NOT DETECTED Final   Streptococcus pneumoniae NOT DETECTED NOT DETECTED Final   Streptococcus pyogenes NOT DETECTED NOT DETECTED Final   A.calcoaceticus-baumannii NOT DETECTED NOT DETECTED Final   Bacteroides fragilis NOT DETECTED NOT DETECTED Final   Enterobacterales NOT DETECTED NOT DETECTED Final   Enterobacter cloacae complex NOT DETECTED NOT DETECTED Final   Escherichia coli NOT DETECTED NOT DETECTED Final   Klebsiella aerogenes NOT DETECTED NOT DETECTED Final   Klebsiella oxytoca NOT DETECTED NOT DETECTED Final   Klebsiella pneumoniae NOT DETECTED NOT DETECTED Final   Proteus species NOT DETECTED NOT DETECTED Final   Salmonella species NOT DETECTED NOT DETECTED Final   Serratia marcescens NOT DETECTED NOT DETECTED Final   Haemophilus influenzae NOT DETECTED NOT DETECTED Final   Neisseria meningitidis NOT DETECTED NOT DETECTED Final   Pseudomonas aeruginosa NOT DETECTED NOT DETECTED Final   Stenotrophomonas maltophilia NOT DETECTED NOT DETECTED Final   Candida albicans NOT DETECTED NOT DETECTED Final   Candida auris NOT DETECTED NOT DETECTED Final   Candida glabrata NOT DETECTED NOT DETECTED Final   Candida krusei NOT DETECTED NOT DETECTED Final   Candida parapsilosis NOT DETECTED NOT DETECTED Final   Candida tropicalis NOT DETECTED NOT  DETECTED Final   Cryptococcus neoformans/gattii NOT DETECTED NOT DETECTED Final   Meth resistant mecA/C and MREJ DETECTED (A) NOT DETECTED Final    Comment: CRITICAL RESULT CALLED TO, READ BACK BY AND VERIFIED WITH:  DAVID BESANTI AT 0607 03/22/20 SDR Performed at [facility], [address], [place], [place] [address]   Urine culture     Status: Abnormal   Collection Time: 03/30/2020  6:52 PM   Specimen: Urine, Random  Result Value Ref Range Status   Specimen Description   Final    URINE, RANDOM Performed at [facility], [address], [place], [place] [address]    Special Requests   Final    NONE Performed at [facility], [address] [place], [place] [address]    Culture MULTIPLE SPECIES PRESENT, SUGGEST RECOLLECTION (A)  Final   Report Status 03/22/2020 FINAL  Final  MRSA PCR Screening     Status:  Abnormal   Collection Time: 03/22/20  8:30 AM   Specimen: Nasopharyngeal  Result Value Ref Range Status   MRSA by PCR POSITIVE (A) NEGATIVE Final    Comment:        The GeneXpert MRSA Assay (FDA approved for NASAL specimens only), is one component of a comprehensive MRSA colonization surveillance program. It is not intended to diagnose MRSA infection nor to guide or monitor treatment for MRSA infections. RESULT CALLED TO, READ BACK BY AND VERIFIED WITH:  [facility] WHITE AT 1007 03/22/20 SDR Performed at [facility], [address] [place], [place] [address]   Aerobic/Anaerobic Culture (surgical/deep wound)     Status: None (Preliminary result)   Collection Time: 03/21/2020  6:14 PM   Specimen: PATH Other; Wound  Result Value Ref Range Status   Specimen Description   Final    WOUND Performed at [facility], [address] [place], [place] [address]    Special Requests   Final    DEEP TISSUE SACRAL CULTURE Performed at [facility], [address] [place], [place] [address]    Gram Stain   Final    NO WBC  SEEN ABUNDANT GRAM POSITIVE COCCI IN PAIRS FEW GRAM NEGATIVE RODS Performed at [facility], [place] [address], [place], [place] [address]    Culture PENDING  Incomplete   Report Status PENDING  Incomplete  Aerobic/Anaerobic Culture (surgical/deep wound)     Status: None (Preliminary result)   Collection Time: 03/31/2020  6:14 PM   Specimen: PATH Other; Wound  Result Value Ref Range Status   Specimen Description   Final    WOUND Performed at [facility], [address], [place], [place] [address]    Special Requests   Final    DEEP SACRAL BONE CULTURE Performed at [facility], [address] [place], [place] [address]    Gram Stain   Final    NO WBC SEEN NO ORGANISMS SEEN Performed at [facility], [place] [address] [place], [place] [address]    Culture PENDING  Incomplete   Report Status PENDING  Incomplete  Culture, blood (Routine X 2) w Reflex to ID Panel     Status: None (Preliminary result)   Collection Time: 03/26/2020 10:12 PM   Specimen: BLOOD  Result Value Ref Range Status   Specimen Description BLOOD RIGHT HAND  Final   Special Requests   Final    BOTTLES DRAWN AEROBIC AND ANAEROBIC Blood Culture adequate volume   Culture   Final    NO GROWTH < 12 HOURS Performed at [facility], [address], [place], [place] [address]    Report Status PENDING  Incomplete  Culture, blood (Routine X 2) w Reflex to ID Panel     Status: None (Preliminary result)   Collection Time: 03/12/2020 10:13 PM   Specimen: BLOOD  Result Value Ref Range Status   Specimen Description BLOOD LEFT HAND  Final   Special Requests   Final    BOTTLES DRAWN AEROBIC AND ANAEROBIC Blood Culture adequate volume   Culture   Final    NO GROWTH < 12 HOURS Performed at [facility], [address] [place], [place] [address]    Report Status PENDING  Incomplete          IMAGING    DG Abd Portable 1V  Result Date: 04/02/2020 CLINICAL DATA:  OG tube  placement EXAM: PORTABLE ABDOMEN - 1 VIEW COMPARISON:  CT  03/27/2020 FINDINGS: Esophageal tube tip and side port overlie the gastric body. Air-filled but nondilated central small bowel with scattered colon gas. Questionable fluid level beneath the right diaphragm IMPRESSION: 1. Esophageal tube tip and side port overlie the gastric body. 2. Possible fluid level beneath the right diaphragm, suggest decubitus view. These results will be called to the ordering clinician or representative by the Radiologist Assistant, and communication documented in the PACS or [employer]. Electronically Signed   By: Donavan Foil M.D.   On: 04/09/2020 19:33   ECHOCARDIOGRAM COMPLETE  Result Date: 03/21/2020    ECHOCARDIOGRAM REPORT   Patient Name:   [name] Braman Date of Exam: 03/12/2020 Medical Rec #:  [id number]       Height:       71.0 in Accession #:    [id number]      Weight:       163.1 lb Date of Birth:  03-22-1956        BSA:          1.934 m Patient Age:    64 years        BP:           107/64 mmHg Patient Gender: M               HR:           96 bpm. Exam Location:  [facility] Procedure: 2D Echo, Color Doppler and Cardiac Doppler Indications:     R50.9 Fever  History:         Patient has no prior history of Echocardiogram examinations.                  Stroke; Risk Factors:Dyslipidemia.  Sonographer:     Charmayne Sheer RDCS (AE) Referring Phys:  [id number] Cheryll Dessert Diagnosing Phys: Kathlyn Sacramento MD IMPRESSIONS  1. Left ventricular ejection fraction, by estimation, is 60 to 65%. The left ventricle has normal function. The left ventricle has no regional wall motion abnormalities. Left ventricular diastolic parameters were normal.  2. Right ventricular systolic function is normal. The right ventricular size is normal. Tricuspid regurgitation signal is inadequate for assessing PA pressure.  3. The mitral valve is normal in structure. No evidence of mitral valve regurgitation. No evidence of mitral stenosis.  4. The aortic  valve is normal in structure. Aortic valve regurgitation is not visualized. Mild aortic valve sclerosis is present, with no evidence of aortic valve stenosis.  5. The inferior vena cava is normal in size with greater than 50% respiratory variability, suggesting right atrial pressure of 3 mmHg. Conclusion(s)/Recommendation(s): No evidence of valvular vegetations on this transthoracic echocardiogram. FINDINGS  Left Ventricle: Left ventricular ejection fraction, by estimation, is 60 to 65%. The left ventricle has normal function. The left ventricle has no regional wall motion abnormalities. The left ventricular internal cavity size was normal in size. There is  no left ventricular hypertrophy. Left ventricular diastolic parameters were normal. Right Ventricle: The right ventricular size is normal. No increase in right ventricular wall thickness. Right ventricular systolic function is normal. Tricuspid regurgitation signal is inadequate for assessing PA pressure. Left Atrium: Left atrial size was normal in size. Right Atrium: Right atrial size was normal in size. Pericardium: There is no evidence of pericardial effusion. Mitral Valve: The mitral valve is normal in structure. No evidence of mitral valve regurgitation. No evidence of mitral valve stenosis. MV peak gradient, 5.8 mmHg. The mean mitral valve gradient is 3.0 mmHg. Tricuspid Valve: The  tricuspid valve is normal in structure. Tricuspid valve regurgitation is not demonstrated. No evidence of tricuspid stenosis. Aortic Valve: The aortic valve is normal in structure. Aortic valve regurgitation is not visualized. Mild aortic valve sclerosis is present, with no evidence of aortic valve stenosis. Aortic valve mean gradient measures 5.0 mmHg. Aortic valve peak gradient measures 10.1 mmHg. Aortic valve area, by VTI measures 2.42 cm. Pulmonic Valve: The pulmonic valve was normal in structure. Pulmonic valve regurgitation is not visualized. No evidence of pulmonic  stenosis. Aorta: The aortic root is normal in size and structure. Venous: The inferior vena cava is normal in size with greater than 50% respiratory variability, suggesting right atrial pressure of 3 mmHg. IAS/Shunts: No atrial level shunt detected by color flow Doppler.  LEFT VENTRICLE PLAX 2D LVIDd:         4.59 cm  Diastology LVIDs:         2.99 cm  LV e' medial:    9.14 cm/s LV PW:         0.92 cm  LV E/e' medial:  11.4 LV IVS:        0.81 cm  LV e' lateral:   10.60 cm/s LVOT diam:     1.90 cm  LV E/e' lateral: 9.8 LV SV:         64 LV SV Index:   33 LVOT Area:     2.84 cm  RIGHT VENTRICLE RV Basal diam:  3.02 cm LEFT ATRIUM             Index       RIGHT ATRIUM           Index LA diam:        3.40 cm 1.76 cm/m  RA Area:     13.00 cm LA Vol (A2C):   31.8 ml 16.45 ml/m RA Volume:   30.90 ml  15.98 ml/m LA Vol (A4C):   29.8 ml 15.41 ml/m LA Biplane Vol: 34.2 ml 17.69 ml/m  AORTIC VALVE                    PULMONIC VALVE AV Area (Vmax):    2.09 cm     PV Vmax:       1.18 m/s AV Area (Vmean):   2.06 cm     PV Vmean:      69.600 cm/s AV Area (VTI):     2.42 cm     PV VTI:        0.174 m AV Vmax:           159.00 cm/s  PV Peak grad:  5.6 mmHg AV Vmean:          107.000 cm/s PV Mean grad:  2.0 mmHg AV VTI:            0.266 m AV Peak Grad:      10.1 mmHg AV Mean Grad:      5.0 mmHg LVOT Vmax:         117.00 cm/s LVOT Vmean:        77.800 cm/s LVOT VTI:          0.227 m LVOT/AV VTI ratio: 0.85  AORTA Ao Root diam: 3.20 cm MITRAL VALVE MV Area (PHT): 6.96 cm     SHUNTS MV Peak grad:  5.8 mmHg     Systemic VTI:  0.23 m MV Mean grad:  3.0 mmHg     Systemic Diam: 1.90 cm MV Vmax:  1.20 m/s MV Vmean:      82.1 cm/s MV Decel Time: 109 msec MV E velocity: 104.00 cm/s MV A velocity: 85.40 cm/s MV E/A ratio:  1.22 Kathlyn Sacramento MD Electronically signed by Kathlyn Sacramento MD Signature Date/Time: 03/31/2020/8:55:45 AM    Final      Nutrition Status: Nutrition Problem: Severe Malnutrition Etiology: chronic illness  (parkinson's) Signs/Symptoms: moderate fat depletion, severe fat depletion, moderate muscle depletion, severe muscle depletion Interventions: Ensure Enlive (each supplement provides 350kcal and 20 grams of protein), MVI, Magic cup     Indwelling Urinary Catheter continued, requirement due to   Reason to continue Indwelling Urinary Catheter strict Intake/Output monitoring for hemodynamic instability   Central Line/ continued, requirement due to  Reason to continue [place] of central venous pressure or other hemodynamic parameters and poor IV access   Ventilator continued, requirement due to severe respiratory failure   Ventilator Sedation RASS 0 to -2      ASSESSMENT AND PLAN SYNOPSIS  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   Overall, patient is critically ill, prognosis is guarded.  Patient with Multiorgan failure and at high risk for cardiac arrest and death.    Corrin Parker, M.D.  [facility]  Medical Director [place] Director [facility] [place] Cardio-Pulmonary Department

## 2020-03-25 ENCOUNTER — Encounter: Payer: Self-pay | Admitting: Anesthesiology

## 2020-03-25 ENCOUNTER — Encounter: Payer: Self-pay | Admitting: Internal Medicine

## 2020-03-25 ENCOUNTER — Encounter: Admission: EM | Disposition: E | Payer: Self-pay | Source: Home / Self Care | Attending: Internal Medicine

## 2020-03-25 ENCOUNTER — Inpatient Hospital Stay: Payer: Medicare Other

## 2020-03-25 DIAGNOSIS — E43 Unspecified severe protein-calorie malnutrition: Secondary | ICD-10-CM

## 2020-03-25 LAB — BASIC METABOLIC PANEL
Anion gap: 9 (ref 5–15)
BUN: 78 mg/dL — ABNORMAL HIGH (ref 8–23)
CO2: 21 mmol/L — ABNORMAL LOW (ref 22–32)
Calcium: 7.4 mg/dL — ABNORMAL LOW (ref 8.9–10.3)
Chloride: 120 mmol/L — ABNORMAL HIGH (ref 98–111)
Creatinine, Ser: 2.2 mg/dL — ABNORMAL HIGH (ref 0.61–1.24)
GFR calc Af Amer: 35 mL/min — ABNORMAL LOW (ref >60)
GFR calc non Af Amer: 30 mL/min — ABNORMAL LOW (ref >60)
Glucose, Bld: 129 mg/dL — ABNORMAL HIGH (ref 70–99)
Potassium: 4.2 mmol/L (ref 3.5–5.1)
Sodium: 150 mmol/L — ABNORMAL HIGH (ref 135–145)

## 2020-03-25 LAB — BLOOD GAS, ARTERIAL
Acid-base deficit: 7.6 mmol/L — ABNORMAL HIGH (ref 0.0–2.0)
Bicarbonate: 20.9 mmol/L (ref 20.0–28.0)
FIO2: 100
MECHVT: 500 mL
O2 Saturation: 98 %
Patient temperature: 37
RATE: 15 resp/min
pCO2 arterial: 56 mmHg — ABNORMAL HIGH (ref 32.0–48.0)
pH, Arterial: 7.18 — CL (ref 7.350–7.450)
pO2, Arterial: 126 mmHg — ABNORMAL HIGH (ref 83.0–108.0)

## 2020-03-25 LAB — GLUCOSE, CAPILLARY
Glucose-Capillary: 99 mg/dL (ref 70–99)
Glucose-Capillary: 110 mg/dL — ABNORMAL HIGH (ref 70–99)
Glucose-Capillary: 124 mg/dL — ABNORMAL HIGH (ref 70–99)
Glucose-Capillary: 123 mg/dL — ABNORMAL HIGH (ref 70–99)
Glucose-Capillary: 127 mg/dL — ABNORMAL HIGH (ref 70–99)
Glucose-Capillary: 111 mg/dL — ABNORMAL HIGH (ref 70–99)
Glucose-Capillary: 126 mg/dL — ABNORMAL HIGH (ref 70–99)

## 2020-03-25 LAB — CBC
HCT: 27.9 % — ABNORMAL LOW (ref 39.0–52.0)
Hemoglobin: 8.8 g/dL — ABNORMAL LOW (ref 13.0–17.0)
MCH: 30.1 pg (ref 26.0–34.0)
MCHC: 31.5 g/dL (ref 30.0–36.0)
MCV: 95.5 fL (ref 80.0–100.0)
Platelets: 51 10*3/uL — ABNORMAL LOW (ref 150–400)
RBC: 2.92 MIL/uL — ABNORMAL LOW (ref 4.22–5.81)
RDW: 17.4 % — ABNORMAL HIGH (ref 11.5–15.5)
WBC: 23 10*3/uL — ABNORMAL HIGH (ref 4.0–10.5)
nRBC: 0 % (ref 0.0–0.2)

## 2020-03-25 LAB — SURGICAL PATHOLOGY

## 2020-03-25 LAB — HISTOPLASMA ANTIGEN, URINE: Histoplasma Antigen, urine: <0.5 (ref <0.5)

## 2020-03-25 LAB — PHOSPHORUS: Phosphorus: 6.1 mg/dL — ABNORMAL HIGH (ref 2.5–4.6)

## 2020-03-25 LAB — MAGNESIUM: Magnesium: 2.2 mg/dL (ref 1.7–2.4)

## 2020-03-25 SURGERY — IRRIGATION AND DEBRIDEMENT WOUND
Anesthesia: General

## 2020-03-25 MED ORDER — LIDOCAINE HCL 1 % IJ SOLN
5.0000 mL | Freq: Once | INTRAMUSCULAR | Status: DC
  Filled 2020-03-25: qty 10

## 2020-03-25 MED ORDER — NOREPINEPHRINE 16 MG/250ML-% IV SOLN
0.0000 ug/min | INTRAVENOUS | Status: DC
  Administered 2020-03-25: 15 ug/min via INTRAVENOUS
  Administered 2020-03-25: 30 ug/min via INTRAVENOUS
  Administered 2020-03-26: 12 ug/min via INTRAVENOUS
  Administered 2020-03-28: 4 ug/min via INTRAVENOUS
  Filled 2020-03-25 (×6): qty 250

## 2020-03-25 MED ORDER — VANCOMYCIN HCL IN DEXTROSE 1-5 GM/200ML-% IV SOLN
1000.0000 mg | INTRAVENOUS | Status: DC
  Administered 2020-03-25 – 2020-03-28 (×4): 1000 mg via INTRAVENOUS
  Filled 2020-03-25 (×5): qty 200

## 2020-03-25 SURGICAL SUPPLY — 41 items
BLADE SURG 15 STRL LF DISP TIS (BLADE) ×2 IMPLANT
BLADE SURG 15 STRL SS (BLADE) ×2
BRIEF STRETCH MATERNITY 2XLG (MISCELLANEOUS) ×3 IMPLANT
BRUSH SCRUB EZ  4% CHG (MISCELLANEOUS) ×1
BRUSH SCRUB EZ 4% CHG (MISCELLANEOUS) ×2 IMPLANT
CANISTER SUCT 1200ML W/VALVE (MISCELLANEOUS) ×3 IMPLANT
CANISTER WOUND CARE 500ML ATS (WOUND CARE) ×3 IMPLANT
COVER WAND RF STERILE (DRAPES) ×3 IMPLANT
DRAPE LAPAROTOMY 100X77 ABD (DRAPES) ×3 IMPLANT
DRSG VAC ATS MED SENSATRAC (GAUZE/BANDAGES/DRESSINGS) ×3 IMPLANT
ELECT CAUTERY BLADE TIP 2.5 (TIP) ×2
ELECT REM PT RETURN 9FT ADLT (ELECTROSURGICAL) ×2
ELECTRODE CAUTERY BLDE TIP 2.5 (TIP) ×2 IMPLANT
ELECTRODE REM PT RTRN 9FT ADLT (ELECTROSURGICAL) ×2 IMPLANT
GAUZE PACKING IODOFORM 1X5 (PACKING) IMPLANT
GAUZE SPONGE 4X4 12PLY STRL (GAUZE/BANDAGES/DRESSINGS) ×3 IMPLANT
GLOVE BIO SURGEON STRL SZ 6.5 (GLOVE) ×3 IMPLANT
GLOVE INDICATOR 7.0 STRL GRN (GLOVE) ×6 IMPLANT
GOWN STRL REUS W/ TWL LRG LVL3 (GOWN DISPOSABLE) ×4 IMPLANT
GOWN STRL REUS W/TWL LRG LVL3 (GOWN DISPOSABLE) ×4
KIT TURNOVER KIT A (KITS) ×3 IMPLANT
LABEL OR SOLS (LABEL) ×3 IMPLANT
NEEDLE HYPO 22GX1.5 SAFETY (NEEDLE) ×3 IMPLANT
NS IRRIG 1000ML POUR BTL (IV SOLUTION) ×3 IMPLANT
PACK BASIN MINOR (MISCELLANEOUS) ×3 IMPLANT
PULSAVAC PLUS IRRIG FAN TIP (DISPOSABLE)
SOL .9 NS 3000ML IRR  AL (IV SOLUTION) ×1
SOL .9 NS 3000ML IRR AL (IV SOLUTION) ×1
SOL .9 NS 3000ML IRR UROMATIC (IV SOLUTION) ×2 IMPLANT
SOL PREP PVP 2OZ (MISCELLANEOUS) ×2
SOLUTION PREP PVP 2OZ (MISCELLANEOUS) ×2 IMPLANT
SPONGE LAP 18X18 RF (DISPOSABLE) ×3 IMPLANT
SUT ETHILON 3-0 FS-10 30 BLK (SUTURE)
SUT VICRYL+ 3-0 36IN CT-1 (SUTURE) IMPLANT
SUTURE EHLN 3-0 FS-10 30 BLK (SUTURE) IMPLANT
SWAB DUAL CULTURE TRANS RED ST (MISCELLANEOUS) IMPLANT
SYR 10ML LL (SYRINGE) ×3 IMPLANT
SYR 20ML LL LF (SYRINGE) ×3 IMPLANT
SYR BULB 3OZ (MISCELLANEOUS) IMPLANT
SYR BULB IRRIG 60ML STRL (SYRINGE) ×3 IMPLANT
TIP FAN IRRIG PULSAVAC PLUS (DISPOSABLE) IMPLANT

## 2020-03-25 NOTE — Progress Notes (Signed)
Date of Admission:  03/20/2020     ID: [name] Mooradian is a 64 y.o. male with  Principal Problem:   AKI (acute kidney injury) ([place]) Active Problems:   Protein-calorie malnutrition, severe ([place])   MDD (major depressive disorder), recurrent episode, severe ([place])   Hyperlipidemia   Primary Parkinsonism ([place])   Weakness generalized   Hyponatremia   Hypokalemia   FTT (failure to thrive) in adult   Thrombocytopenia ([place])   Community acquired pneumonia   Sepsis ([place])   Sacral decubitus ulcer, stage IV (HCC)   Severe sepsis (HCC)    Subjective: Intubated  No history from patient  Medications:  . carbidopa-levodopa  1 tablet Oral TID  . chlorhexidine  15 mL Mouth Rinse BID  . Chlorhexidine Gluconate Cloth  6 each Topical Daily  . Chlorhexidine Gluconate Cloth  6 each Topical Q0600  . heparin  5,000 Units Subcutaneous Q8H  . mouth rinse  15 mL Mouth Rinse Q2H  . mupirocin ointment  1 application Nasal BID  . nitroGLYCERIN  1 inch Topical Q8H  . Vitamin D (Ergocalciferol)  50,000 Units Oral Q Mon    Objective: Vital signs in last 24 hours: Temp:  [96.8 F (36 C)-101.8 F (38.8 C)] 97.9 F (36.6 C) (09/15 1300) Pulse Rate:  [96-145] 123 (09/15 1300) Resp:  [13-23] 17 (09/15 1300) BP: (68-143)/(45-121) 98/58 (09/15 1300) SpO2:  [92 %-100 %] 92 % (09/15 1300) FiO2 (%):  [45 %] 45 % (09/15 1200) Weight:  [76.3 kg] 76.3 kg (09/15 0425)  PHYSICAL EXAM:  General: intubated Head: Normocephalic,  Eyes: Conjunctivae clear, anicteric sclerae. Pupils are equal   Neck: Supple,  Back:saw pictures    Lungs: b/l air entry Heart: tachycardia Abdomen: Soft, non-tender,not distended. Bowel sounds normal. No masses foley Extremities: edema legs Skin: No rashes or lesions. Or bruising Lymph: Cervical, supraclavicular normal. Neurologic: cannot be assessed  Lab Results Recent Labs    04/04/2020 1922 04/04/2020 0420 03/11/2020 0819  WBC 14.8*  --  23.0*  HGB 9.8*  --  8.8*    HCT 29.1*  --  27.9*  NA 146* 150*  --   K 3.5 4.2  --   CL 114* 120*  --   CO2 20* 21*  --   BUN 83* 78*  --   CREATININE 2.42* 2.20*  --     Microbiology: MRSA in blood culture 03/11/2020 MRSA sacral wound culture Bone culture ng so far  Studies/Results: DG Abd Portable 1V  Result Date: 03/22/2020 CLINICAL DATA:  OG tube placement EXAM: PORTABLE ABDOMEN - 1 VIEW COMPARISON:  CT 03/23/2020 FINDINGS: Esophageal tube tip and side port overlie the gastric body. Air-filled but nondilated central small bowel with scattered colon gas. Questionable fluid level beneath the right diaphragm IMPRESSION: 1. Esophageal tube tip and side port overlie the gastric body. 2. Possible fluid level beneath the right diaphragm, suggest decubitus view. These results will be called to the ordering clinician or representative by the Radiologist Assistant, and communication documented in the PACS or [employer]. Electronically Signed   By: Donavan Foil M.D.   On: 03/14/2020 19:33     Assessment/Plan: ?MRSA bacteremia with sepsis Bilateral cavitating infiltrates in the lung concerning for septic emboli with MRSA. Patient will need TEE as 2d echo normal  repeat blood culture from 9/13 neg so far Continue vancomycin  Sacral decubitus -underwent debridement- stage IV. Culture sent- continue unasyn for now On low dose levophed Intubated post surgery  AKI- due  to infection- improving  Anemia     Parkinsons disease on carbidopa-levodopa   Discussed with his nurse

## 2020-03-25 NOTE — Progress Notes (Signed)
Wet to dry dressing to sacral wound changed at bedside by Otho Ket, PA. Sats dropped to 83% while turned, came up to upper 80s with 100% oxygen, recovered quickly once supine. Pt suctioned after turning with copious thick secretions from ETT.

## 2020-03-25 NOTE — Progress Notes (Signed)
Pharmacy Antibiotic Note  [name] is a 64 y.o. male admitted on 03/13/2020 with pneumonia/MRSA bacteremia/sacral decubutis.  Pharmacy has been consulted for vancomycin/Unasyn dosing. ID is following.   Today, 03/15/2020  Day #4 antibiotics: vancomycin and ampicillin/sulbactam  Tmax: 97.2  WBC: 23 (increased from 14.8)  Scr: 2.20, CrCl 35.49mL/min   Cultures:  Blood cx 9/11: MRSA, 4/4 bottles  Repeat blood cx 9/13: NGTD x2 days  Wound Cx from 9/13 debridement: NGTD x2 days (gram stain GPC pairs, GNR)    Last vancomycin level: random level of 16 mcg/Lobtained 9/14 at 0244.  Last vancomycin dose: 1250mg  on 9/14 @ 1500.  Plan:  Initiate maintenance dose of vancomycin 1000mg  IV every 24 hours beginning 9/15 at 1500   Monitor Scr daily   Monitor vancomycin trough 30 minutes prior to 3rd dose  Continue ampicillin/sulbactam as renal function stable    Height: 5' 10.98" (180.3 cm) Weight: 76.3 kg (168 lb 3.4 oz) IBW/kg (Calculated) : 75.26  Temp (24hrs), Avg:98.4 F (36.9 C), Min:96.8 F (36 C), Max:101.8 F (38.8 C)  Recent Labs  Lab 03/17/2020 1744 03/20/2020 1744 03/22/20 0106 03/22/20 0517 03/22/20 2018 03/15/2020 0441 04/07/2020 1346 03/11/2020 1922 03/30/2020 2211 03/24/20 0244 03/18/2020 0420 03/24/2020 0819  WBC 10.3   < >  --  11.5*  --  17.9* 17.8* 14.8*  --   --   --  23.0*  CREATININE 4.01*  --   --  3.94*  --  3.09*  --  2.42*  --   --  2.20*  --   LATICACIDVEN 2.7*  --  3.2* 4.3*  --   --   --  2.4* 2.2*  --   --   --   VANCORANDOM  --   --   --   --  8  --   --   --   --  16  --   --    < > = values in this interval not displayed.    Estimated Creatinine Clearance: 35.7 mL/min (A) (by C-G formula based on SCr of 2.2 mg/dL (H)).    No Known Allergies  Antimicrobials this admission: Vancomycin 9/11 >> Unasyn 9/13 >>  Zosyn 9/12>>9/12 Dose adjustments this admission: Unasyn 3g every 12 hours changed to Unasyn 3g every 6 hours due to improved renal  function.  Thank you for allowing pharmacy to be a part of this patient's care.  [name] 03/29/2020 9:58 AM

## 2020-03-25 NOTE — Anesthesia Preprocedure Evaluation (Deleted)
Anesthesia Evaluation  Patient identified by MRN, date of birth, ID band  Reviewed: Allergy & Precautions, NPO status , Patient's Chart, lab work & pertinent test results  Airway Mallampati: III  TM Distance: >3 FB Neck ROM: limited    Dental  (+) Poor Dentition, Chipped, Missing   Pulmonary pneumonia, unresolved, former smoker,    + rhonchi        Cardiovascular negative cardio ROS       Neuro/Psych PSYCHIATRIC DISORDERS Anxiety Depression Parkinsons dz  Neuromuscular disease CVA, Residual Symptoms    GI/Hepatic malnutrition   Endo/Other    Renal/GU Renal disease  negative genitourinary   Musculoskeletal  (+) Arthritis , Osteoarthritis,    Abdominal Normal abdominal exam  (+)   Peds negative pediatric ROS (+)  Hematology  (+) Blood dyscrasia, ,   Anesthesia Other Findings Past Medical History: 03/25/2020: AKI (acute kidney injury) ([place]) No date: Anxiety No date: Cataract No date: Depression 01/10/2019: Hyperlipidemia 12/25/2018: Idiopathic peripheral neuropathy No date: Prostate disease No date: Stroke [facility])  Reproductive/Obstetrics                             Anesthesia Physical  Anesthesia Plan  ASA: V  Anesthesia Plan: General   Post-op Pain Management:    Induction: Intravenous  PONV Risk Score and Plan:   Airway Management Planned: Oral ETT  Additional Equipment:   Intra-op Plan:   Post-operative Plan: Extubation in OR and Post-operative intubation/ventilation  Informed Consent: I have reviewed the patients History and Physical, chart, labs and discussed the procedure including the risks, benefits and alternatives for the proposed anesthesia with the patient or authorized representative who has indicated his/her understanding and acceptance.   Patient has DNR.  Discussed DNR with power of attorney and Suspend DNR.   Dental advisory given  Plan Discussed with:  CRNA and Surgeon  Anesthesia Plan Comments: (Wife informed that they are higher risk for complications from anesthesia during this procedure due to their medical history.  Patient voiced understanding.  Wife consented for risks of anesthesia including but not limited to:  - adverse reactions to medications - damage to eyes, teeth, lips or other oral mucosa - nerve damage due to positioning  - sore throat or hoarseness - Damage to heart, brain, nerves, lungs, other parts of body or loss of life  She voiced understanding.)        Anesthesia Quick Evaluation

## 2020-03-25 NOTE — Progress Notes (Signed)
CRITICAL CARE NOTE  9/12 admitted for septic shock MRSA bacteremia with sacral decub 9/13 post opresp failure, septic shock and sepsis 9/13 Liquified necrotic fat was encountered.  A pocket of pus was cultured.  excise tissue until all visibly necrotic areas were excised down to bleeding tissue. The sacral bone was exposed.  A rongeur was used to take bone biopsies for culture and histopathology. 9/14 post op resp failure due to septic shock 9/15 severe septic shock  CC  Follow up septic shock and resp failure  SUBJECTIVE Remains with multiorgan failure Critically ill Prognosis is grave and poor Very LOW chance of meaningful recovery Recommend comfort care status with progressive end stage Parkinson disease  On vent, on pressors   BP 124/68   Pulse 100   Temp (!) 97.3 F (36.3 C)   Resp 13   Ht 5' 10.98" (1.803 m)   Wt 76.3 kg   SpO2 98%   BMI 23.47 kg/m    I/O last 3 completed shifts: In: 4955.6 [I.V.:4087.4; Other:50; NG/GT:70; IV Piggyback:748.2] Out: 1905 [Urine:1905] Total I/O In: 433.6 [I.V.:431.9; IV Piggyback:1.7] Out: 290 [Urine:290]  SpO2: 98 % O2 Flow Rate (L/min): 3 L/min FiO2 (%): 45 %  Estimated body mass index is 23.47 kg/m as calculated from the following:   Height as of this encounter: 5' 10.98" (1.803 m).   Weight as of this encounter: 76.3 kg.   REVIEW OF SYSTEMS  PATIENT IS UNABLE TO PROVIDE COMPLETE REVIEW OF SYSTEM S DUE TO SEVERE CRITICAL ILLNESS AND ENCEPHALOPATHY    Pressure Injury 01/08/18 Stage I -  Intact skin with non-blanchable redness of a localized area usually over a bony prominence. Bilateral unblanchable redness on buttocks (Active)  01/08/18 2351  Location: Buttocks  Location Orientation: Medial;Bilateral  Staging: Stage I -  Intact skin with non-blanchable redness of a localized area usually over a bony prominence.  Wound Description (Comments): Bilateral unblanchable redness on buttocks  Present on Admission: Yes      Pressure Injury 03/20/2020 Sacrum Right;Left;Posterior Unstageable - Full thickness tissue loss in which the base of the injury is covered by slough (yellow, tan, gray, green or brown) and/or eschar (tan, brown or black) in the wound bed. Pt with necrosis  (Active)  04/01/2020 1744  Location: Sacrum  Location Orientation: Right;Left;Posterior  Staging: Unstageable - Full thickness tissue loss in which the base of the injury is covered by slough (yellow, tan, gray, green or brown) and/or eschar (tan, brown or black) in the wound bed.  Wound Description (Comments): Pt with necrosis on sacral wound   Present on Admission:    PHYSICAL EXAMINATION:  GENERAL:critically ill appearing, +resp distress PULMONARY: +rhonchi, +wheezing MUSCULOSKELETAL: sacral woudn wound vac NEUROLOGIC: obtunded SKIN:intact,warm,dry INTUBATED/SEDATED      CULTURE RESULTS   Recent Results (from the past 240 hour(s))  Blood Culture (routine x 2)     Status: Abnormal   Collection Time: 03/27/2020  5:43 PM   Specimen: BLOOD  Result Value Ref Range Status   Specimen Description   Final    BLOOD LEFT FORE ARM Performed at [facility], [address], [place], [place] [address]    Special Requests   Final    BOTTLES DRAWN AEROBIC AND ANAEROBIC Blood Culture adequate volume Performed at [facility], [address] [place], [place] [address]    Culture  Setup Time   Final    GRAM POSITIVE COCCI IN BOTH AEROBIC AND ANAEROBIC BOTTLES CRITICAL VALUE NOTED.  VALUE IS  CONSISTENT WITH PREVIOUSLY REPORTED AND CALLED VALUE. Performed at [facility], [address] [place], [place] [address]    Culture (A)  Final    STAPHYLOCOCCUS AUREUS SUSCEPTIBILITIES PERFORMED ON PREVIOUS CULTURE WITHIN THE LAST 5 DAYS. Performed at [facility], [place] [address] [place], [place] [address]    Report Status 03/24/2020 FINAL  Final  SARS Coronavirus 2 by RT PCR (hospital order, performed in [facility] hospital lab) Nasopharyngeal Nasopharyngeal Swab     Status: None   Collection Time: 04/08/2020  5:43 PM   Specimen: Nasopharyngeal Swab  Result Value Ref Range Status   SARS Coronavirus 2 NEGATIVE NEGATIVE Final    Comment: (NOTE) SARS-CoV-2 target nucleic acids are NOT DETECTED.  The SARS-CoV-2 RNA is generally detectable in upper and lower respiratory specimens during the acute phase of infection. The lowest concentration of SARS-CoV-2 viral copies this assay can detect is 250 copies / mL. A negative result does not preclude SARS-CoV-2 infection and should not be used as the sole basis for treatment or other patient management decisions.  A negative result may occur with improper specimen collection / handling, submission of specimen other than nasopharyngeal swab, presence of viral mutation(s) within the areas targeted by this assay, and inadequate number of viral copies (<250 copies / mL). A negative result must be combined with clinical observations, patient history, and epidemiological information.  Fact Sheet for Patients:   StrictlyIdeas.no  Fact Sheet for Healthcare Providers: BankingDealers.co.za  This test is not yet approved or  cleared by the [place] FDA and has been authorized for detection and/or diagnosis of SARS-CoV-2 by FDA under an Emergency Use Authorization (EUA).  This EUA will remain in effect (meaning this test can be used) for the duration of the COVID-19 declaration under Section 564(b)(1) of the Act, 21 U.S.C. section 360bbb-3(b)(1), unless the authorization is terminated or revoked sooner.  Performed at [facility], [address], [place], [place] [address]   Blood Culture (routine x 2)     Status: Abnormal   Collection Time: 04/02/2020  5:44 PM   Specimen: BLOOD  Result Value Ref Range Status   Specimen Description   Final    BLOOD RIGHT FORE ARM Performed at [facility], [address], [place], [place] [address]    Special Requests   Final    BOTTLES DRAWN AEROBIC AND ANAEROBIC Blood Culture adequate volume Performed at [facility], [address] [place], [place] [address]    Culture  Setup Time   Final    Organism ID to follow GRAM POSITIVE COCCI IN BOTH AEROBIC AND ANAEROBIC BOTTLES CRITICAL RESULT CALLED TO, READ BACK BY AND VERIFIED WITH: DAVID BESANTI AT 0607 03/22/20 [place] Performed at [facility], [address] [place], [place] [address]    Culture METHICILLIN RESISTANT STAPHYLOCOCCUS AUREUS (A)  Final   Report Status 03/24/2020 FINAL  Final   Organism ID, Bacteria METHICILLIN RESISTANT STAPHYLOCOCCUS AUREUS  Final      Susceptibility   Methicillin resistant staphylococcus aureus - MIC*    CIPROFLOXACIN >=8 RESISTANT Resistant     ERYTHROMYCIN >=8 RESISTANT Resistant     GENTAMICIN <=0.5 SENSITIVE Sensitive     OXACILLIN >=4 RESISTANT Resistant     TETRACYCLINE <=1 SENSITIVE Sensitive     VANCOMYCIN 1 SENSITIVE Sensitive     TRIMETH/SULFA <=10 SENSITIVE Sensitive     CLINDAMYCIN <=0.25 SENSITIVE Sensitive     RIFAMPIN <=0.5 SENSITIVE Sensitive  Inducible Clindamycin NEGATIVE Sensitive     * METHICILLIN RESISTANT STAPHYLOCOCCUS AUREUS  Blood Culture ID Panel (Reflexed)     Status: Abnormal   Collection Time: 03/18/2020  5:44 PM  Result Value Ref Range Status   Enterococcus faecalis NOT DETECTED NOT DETECTED Final   Enterococcus Faecium NOT DETECTED NOT DETECTED Final   Listeria monocytogenes NOT DETECTED NOT DETECTED Final   Staphylococcus species DETECTED (A) NOT DETECTED Final    Comment: CRITICAL RESULT CALLED TO, READ BACK BY AND VERIFIED WITH:  DAVID BESANTI AT 0607 03/22/20 SDR    Staphylococcus aureus (BCID) DETECTED (A) NOT DETECTED Final    Comment: Methicillin (oxacillin)-resistant Staphylococcus aureus (MRSA). MRSA is predictably resistant to beta-lactam antibiotics (except ceftaroline).  Preferred therapy is vancomycin unless clinically contraindicated. Patient requires contact precautions if  hospitalized. CRITICAL RESULT CALLED TO, READ BACK BY AND VERIFIED WITH:  DAVID BESANTI AT 0607 03/22/20 SDR    Staphylococcus epidermidis NOT DETECTED NOT DETECTED Final   Staphylococcus lugdunensis NOT DETECTED NOT DETECTED Final   Streptococcus species NOT DETECTED NOT DETECTED Final   Streptococcus agalactiae NOT DETECTED NOT DETECTED Final   Streptococcus pneumoniae NOT DETECTED NOT DETECTED Final   Streptococcus pyogenes NOT DETECTED NOT DETECTED Final   A.calcoaceticus-baumannii NOT DETECTED NOT DETECTED Final   Bacteroides fragilis NOT DETECTED NOT DETECTED Final   Enterobacterales NOT DETECTED NOT DETECTED Final   Enterobacter cloacae complex NOT DETECTED NOT DETECTED Final   Escherichia coli NOT DETECTED NOT DETECTED Final   Klebsiella aerogenes NOT DETECTED NOT DETECTED Final   Klebsiella oxytoca NOT DETECTED NOT DETECTED Final   Klebsiella pneumoniae NOT DETECTED NOT DETECTED Final   Proteus species NOT DETECTED NOT DETECTED Final   Salmonella species NOT DETECTED NOT DETECTED Final   Serratia marcescens NOT DETECTED NOT DETECTED Final   Haemophilus influenzae NOT DETECTED NOT DETECTED Final   Neisseria meningitidis NOT DETECTED NOT DETECTED Final   Pseudomonas aeruginosa NOT DETECTED NOT DETECTED Final   Stenotrophomonas maltophilia NOT DETECTED NOT DETECTED Final   Candida albicans NOT DETECTED NOT DETECTED Final   Candida auris NOT DETECTED NOT DETECTED Final   Candida glabrata NOT DETECTED NOT DETECTED Final   Candida krusei NOT DETECTED NOT DETECTED Final   Candida parapsilosis NOT DETECTED NOT DETECTED Final   Candida tropicalis NOT DETECTED NOT DETECTED Final   Cryptococcus neoformans/gattii NOT DETECTED NOT DETECTED Final   Meth resistant mecA/C and MREJ DETECTED (A) NOT DETECTED Final    Comment: CRITICAL RESULT CALLED TO, READ BACK BY AND VERIFIED WITH:   DAVID BESANTI AT 0607 03/22/20 SDR Performed at [facility], [address], [place], [place] [address]   Urine culture     Status: Abnormal   Collection Time: 03/27/2020  6:52 PM   Specimen: Urine, Random  Result Value Ref Range Status   Specimen Description   Final    URINE, RANDOM Performed at [facility], [address], [place], [place] [address]    Special Requests   Final    NONE Performed at [facility], [address] [place], [place] [address]    Culture MULTIPLE SPECIES PRESENT, SUGGEST RECOLLECTION (A)  Final   Report Status 03/20/2020 FINAL  Final  MRSA PCR Screening     Status: Abnormal   Collection Time: 03/22/20  8:30 AM   Specimen: Nasopharyngeal  Result Value Ref Range Status   MRSA by PCR POSITIVE (A) NEGATIVE Final    Comment:  The GeneXpert MRSA Assay (FDA approved for NASAL specimens only), is one component of a comprehensive MRSA colonization surveillance program. It is not intended to diagnose MRSA infection nor to guide or monitor treatment for MRSA infections. RESULT CALLED TO, READ BACK BY AND VERIFIED WITH:  [facility] WHITE AT 1007 03/22/20 SDR Performed at [facility], [address] [place], [place] [address]   Aerobic/Anaerobic Culture (surgical/deep wound)     Status: None (Preliminary result)   Collection Time: 03/29/2020  6:14 PM   Specimen: PATH Other; Wound  Result Value Ref Range Status   Specimen Description   Final    WOUND Performed at [facility], [address] [place], [place] [address]    Special Requests   Final    DEEP TISSUE SACRAL CULTURE Performed at [facility], [address] [place], [place] [address]    Gram Stain   Final    NO WBC SEEN ABUNDANT GRAM POSITIVE COCCI IN PAIRS FEW GRAM NEGATIVE RODS Performed at [facility], [place] [address], [place], [place] [address]    Culture PENDING  Incomplete   Report Status PENDING  Incomplete   Aerobic/Anaerobic Culture (surgical/deep wound)     Status: None (Preliminary result)   Collection Time: 03/18/2020  6:14 PM   Specimen: PATH Other; Wound  Result Value Ref Range Status   Specimen Description   Final    WOUND Performed at [facility], [address] [place], [place] [address]    Special Requests   Final    DEEP SACRAL BONE CULTURE Performed at [facility], [address] [place], [place] [address]    Gram Stain   Final    NO WBC SEEN NO ORGANISMS SEEN Performed at [facility], [place] [address], [place], [place] [address]    Culture PENDING  Incomplete   Report Status PENDING  Incomplete  Culture, blood (Routine X 2) w Reflex to ID Panel     Status: None (Preliminary result)   Collection Time: 03/16/2020 10:12 PM   Specimen: BLOOD  Result Value Ref Range Status   Specimen Description BLOOD RIGHT HAND  Final   Special Requests   Final    BOTTLES DRAWN AEROBIC AND ANAEROBIC Blood Culture adequate volume   Culture   Final    NO GROWTH 2 DAYS Performed at [facility], [address], [place], [place] [address]    Report Status PENDING  Incomplete  Culture, blood (Routine X 2) w Reflex to ID Panel     Status: None (Preliminary result)   Collection Time: 03/18/2020 10:13 PM   Specimen: BLOOD  Result Value Ref Range Status   Specimen Description BLOOD LEFT HAND  Final   Special Requests   Final    BOTTLES DRAWN AEROBIC AND ANAEROBIC Blood Culture adequate volume   Culture   Final    NO GROWTH 2 DAYS Performed at [facility], [address], [place], [place] [address]    Report Status PENDING  Incomplete          IMAGING    No results found.   Nutrition Status: Nutrition Problem: Severe Malnutrition Etiology: chronic illness (parkinson's) Signs/Symptoms: moderate fat depletion, severe fat depletion, moderate muscle depletion, severe muscle depletion Interventions: Ensure Enlive (each supplement provides  350kcal and 20 grams of protein), MVI, Magic cup     Indwelling Urinary Catheter continued, requirement due to   Reason to continue Indwelling Urinary Catheter strict Intake/Output  monitoring for hemodynamic instability   Central Line/ continued, requirement due to  Reason to continue [employer] of central venous pressure or other hemodynamic parameters and poor IV access   Ventilator continued, requirement due to severe respiratory failure   Ventilator Sedation RASS 0 to -2      ASSESSMENT AND PLAN SYNOPSIS  SEVERE SEPSIS MRSA BACTEREMIA WITH END STAGE PARKINSONS DISEASE GANGRENOUS SACRAL WOUND/OSTEO with POST OP RESP FAILURE AND SEPTIC SHOCK-PROGNOSIS IS VERY POOR, VERY POOR CHANCE OF MEANINGFUL RECOVERY  Severe ACUTE Hypoxic and Hypercapnic Respiratory Failure -continue Mechanical Ventilator support -continue Bronchodilator Therapy -Wean Fio2 and PEEP as tolerated -VAP/VENT bundle implementation  MRSA bacteremia From wound  Poor prognosis and very low chance of meaningful recovery Recommend COMFORT MEASURES    Critical Care Time devoted to patient care services described in this note is 35 minutes.   Overall, patient is critically ill, prognosis is guarded.  Patient with Multiorgan failure and at high risk for cardiac arrest and death.    Corrin Parker, M.D.  [facility]  Medical Director [place] Director [facility] Cardio-Pulmonary Department

## 2020-03-25 NOTE — Progress Notes (Addendum)
[facility] Day(s): 4.   Post op day(s): 2 Days Post-Op.   Interval History:  Patient seen and examined No acute events or new complaints overnight.  Patient remains intubated, sedated, he is requiring 15 mcg/min of norepinephrine and is currently off vasopressin Leukocytosis worse this morning to 23.0K Renal function improved, sCr still 2.20, BUN - 78 UO - 1.3L Hypernatremia 150, hyperphosphatemia 6.1 OR Cx growing gram (+) cocci in pairs and gram (-) rods Bone biopsy WITHOUT signs of osteomyelitis. Tentatively posted for second look in the OR today pending clinical condition   Vital signs in last 24 hours: [min-max] current  Temp:  [96.8 F (36 C)-101.8 F (38.8 C)] 97.3 F (36.3 C) (09/15 1015) Pulse Rate:  [96-150] 100 (09/15 1015) Resp:  [13-18] 13 (09/15 1015) BP: (68-143)/(45-72) 124/68 (09/15 1015) SpO2:  [94 %-100 %] 98 % (09/15 1015) FiO2 (%):  [45 %] 45 % (09/15 1100) Weight:  [168 lb 3.4 oz (76.3 kg)] 168 lb 3.4 oz (76.3 kg) (09/15 0425)     Height: 5' 10.98" (180.3 cm) Weight: 168 lb 3.4 oz (76.3 kg) BMI (Calculated): 23.47   Intake/Output last 2 shifts:  09/14 0701 - 09/15 0700 In: 4955.6 [I.V.:4087.4; NG/GT:70; IV Piggyback:748.2] Out: 6213 [Urine:1305]   Physical Exam:  Constitutional: Intubated Respiratory:On ventilator Cardiovascular: regular rate and sinus rhythm  Genitourinary: foley in place Musculoskeletal: 2+ pitting edema bilateral LE Integumentary: Large sacral wound, packing in place  Labs:  CBC Latest Ref Rng & Units 03/22/2020 04/03/2020 04/05/2020  WBC 4.0 - 10.5 K/uL 23.0(H) 14.8(H) 17.8(H)  Hemoglobin 13.0 - 17.0 g/dL 8.8(L) 9.8(L) 9.8(L)  Hematocrit 39 - 52 % 27.9(L) 29.1(L) 28.2(L)  Platelets 150 - 400 K/uL 51(L) 51(L) 62(L)   CMP Latest Ref Rng & Units 03/19/2020 04/06/2020 03/17/2020  Glucose 70 - 99 mg/dL 129(H) 105(H) 61(L)  BUN 8 - 23 mg/dL 78(H) 83(H) 85(H)  Creatinine 0.61 - 1.24  mg/dL 2.20(H) 2.42(H) 3.09(H)  Sodium 135 - 145 mmol/L 150(H) 146(H) 140  Potassium 3.5 - 5.1 mmol/L 4.2 3.5 3.5  Chloride 98 - 111 mmol/L 120(H) 114(H) 110  CO2 22 - 32 mmol/L 21(L) 20(L) 16(L)  Calcium 8.9 - 10.3 mg/dL 7.4(L) 7.7(L) 7.8(L)  Total Protein 6.5 - 8.1 g/dL - - -  Total Bilirubin 0.3 - 1.2 mg/dL - - -  Alkaline Phos 38 - 126 U/L - - -  AST 15 - 41 U/L - - -  ALT 0 - 44 U/L - - -     Imaging studies: No new pertinent imaging studies   Assessment/Plan: 64 y.o. debilitated and critically ill male still intubated and requiring vasopressor support with Levophed 2 Days Post-Op s/p Excisional debridment of skin, soft tissue, muscle totaling 208 sq cm; biopsy of sacral bone for unstageable sacral decubitus ulceration   - Due to patient's tenuous clinical condition, we will not take him to the OR for a second look/debridement  - PA will perform bedside dressing change this afternoon.  - Continue IV Abx (Unasyn + Vancomycin)             - Continue supportive care per PCCM; we will follow     All of the above findings and recommendations were discussed with the medical team.   -- Fredirick Maudlin, PA-C [facility] 03/25/2020, 11:58 AM [phone] M-F: 7am - 4pm   I saw and evaluated the patient.  I agree with the above documentation, exam, and plan, which  I have edited where appropriate. Fredirick Maudlin  12:00 PM

## 2020-03-25 NOTE — Op Note (Signed)
Preoperative diagnosis: tension pneumothorax Postoperative diagnosis: same  Procedure: Left thoracostomy tube placement  Anesthesia: sedation  Surgeon: Lysle Pearl  Wound Classification: Contaminated  Indications: Patient is a 64 y.o. male  presented with above.  Called emergently by ICU NP for assistance after patient CXR for hypoxia noted tension pneumothorax.  Call received 2312.  Arrived to bedside by 2330.  Specimen: none  Complications: None  Estimated Blood Loss: 24mL  Findings:  1. 660ml clear serous fluid discharge along with air upon entry and dilation 2. Adequate hemostasis.   Description of procedure: The patient was placed in the supine position, intubated and sedated. The area was prepped and draped in the usual sterile fashion. A timeout was completed verifying correct patient, procedure, site, positioning, and implant(s) and/or special equipment prior to beginning this procedure.  Inicision made at 4th intercostal space, anterior axillary line and needle placed through chest wall until air bubbles noted.  Seldingter technique then used to dilate tract after placing guidewire easily through needle and needle removed.  Dilator tip only passed beyond chest wall.  Serial dilated to 75fr chest tube performed and chest tube placed with minimal resistance.  Serous fluid output noted, copius, total measured approximately 74ml.  Chest tube secured to skin using silk, covered area with vaseline gauze, 4x4, secured with large tegaderm.  Tube connected to water seal device on 20cm suction.  Post CXR confirm re-expansion of lung, possible persistent apical pneumo, but chest tube in proper position.    Continue 20cm suction.  Further care per critical care team.

## 2020-03-25 NOTE — Progress Notes (Signed)
Wound Care Note Sacral wound dressing changed at bedside. I preformed wet to dry dressing change with Kerlix gauze and covered with ABD. I did not think the wound would be amenable to wound vac given proximity to the rectum, skin breakdown around the wound itself, and he had some issues with desaturation while turning. The wound itself did not appear to have any further areas or purulence nor necrosis. There was some oozing which was controled with pressure.   Sacral Wound (04/09/2020):     -- Arthur Simon, PA-C [facility] 03/23/2020, 12:59 PM [phone] M-F: 7am - 4pm

## 2020-03-25 NOTE — Progress Notes (Signed)
Notified by RN that patient was de-satting into the high 80s, tachycardic, hypotensive requiring increase in vasopressors and febrile to 37.7. Patient evaluated at bedside and was on PEEP 5 and FIO2 45%. FIO2 increased to 100% and PEEP increased to 8. Oxygen saturation improved to 97%. Stat CXR and ABG ordered.   CXR revealed a left sided tension pneumothorax. PEEP turned to 0 and Surgery was called for placement of chest tube. See note from surgery for procedure details. ABG and CXR pending post chest tube placement.    Tonye Royalty ACNP-BC

## 2020-03-26 ENCOUNTER — Inpatient Hospital Stay: Payer: Medicare Other

## 2020-03-26 DIAGNOSIS — J189 Pneumonia, unspecified organism: Secondary | ICD-10-CM

## 2020-03-26 DIAGNOSIS — J9601 Acute respiratory failure with hypoxia: Secondary | ICD-10-CM

## 2020-03-26 LAB — GLUCOSE, CAPILLARY
Glucose-Capillary: 102 mg/dL — ABNORMAL HIGH (ref 70–99)
Glucose-Capillary: 110 mg/dL — ABNORMAL HIGH (ref 70–99)
Glucose-Capillary: 126 mg/dL — ABNORMAL HIGH (ref 70–99)
Glucose-Capillary: 127 mg/dL — ABNORMAL HIGH (ref 70–99)
Glucose-Capillary: 166 mg/dL — ABNORMAL HIGH (ref 70–99)
Glucose-Capillary: 93 mg/dL (ref 70–99)

## 2020-03-26 LAB — SEDIMENTATION RATE: Sed Rate: 65 mm/hr — ABNORMAL HIGH (ref 0–20)

## 2020-03-26 LAB — BLOOD GAS, ARTERIAL
Acid-base deficit: 5.9 mmol/L — ABNORMAL HIGH (ref 0.0–2.0)
Bicarbonate: 22.3 mmol/L (ref 20.0–28.0)
FIO2: 100
MECHVT: 500 mL
O2 Saturation: 99.8 %
PEEP: 5 cmH2O
Patient temperature: 37
RATE: 15 resp/min
pCO2 arterial: 57 mmHg — ABNORMAL HIGH (ref 32.0–48.0)
pH, Arterial: 7.2 — ABNORMAL LOW (ref 7.350–7.450)
pO2, Arterial: 284 mmHg — ABNORMAL HIGH (ref 83.0–108.0)

## 2020-03-26 LAB — CBC
HCT: 28.4 % — ABNORMAL LOW (ref 39.0–52.0)
Hemoglobin: 8.7 g/dL — ABNORMAL LOW (ref 13.0–17.0)
MCH: 30.2 pg (ref 26.0–34.0)
MCHC: 30.6 g/dL (ref 30.0–36.0)
MCV: 98.6 fL (ref 80.0–100.0)
Platelets: 40 10*3/uL — ABNORMAL LOW (ref 150–400)
RBC: 2.88 MIL/uL — ABNORMAL LOW (ref 4.22–5.81)
RDW: 18.1 % — ABNORMAL HIGH (ref 11.5–15.5)
WBC: 19.6 10*3/uL — ABNORMAL HIGH (ref 4.0–10.5)
nRBC: 0 % (ref 0.0–0.2)

## 2020-03-26 LAB — BASIC METABOLIC PANEL
Anion gap: 8 (ref 5–15)
BUN: 75 mg/dL — ABNORMAL HIGH (ref 8–23)
CO2: 23 mmol/L (ref 22–32)
Calcium: 7.6 mg/dL — ABNORMAL LOW (ref 8.9–10.3)
Chloride: 120 mmol/L — ABNORMAL HIGH (ref 98–111)
Creatinine, Ser: 2.23 mg/dL — ABNORMAL HIGH (ref 0.61–1.24)
GFR calc Af Amer: 35 mL/min — ABNORMAL LOW (ref >60)
GFR calc non Af Amer: 30 mL/min — ABNORMAL LOW (ref >60)
Glucose, Bld: 120 mg/dL — ABNORMAL HIGH (ref 70–99)
Potassium: 4.5 mmol/L (ref 3.5–5.1)
Sodium: 151 mmol/L — ABNORMAL HIGH (ref 135–145)

## 2020-03-26 LAB — BLASTOMYCES ANTIGEN: Blastomyces Antigen: NOT DETECTED ng/mL

## 2020-03-26 LAB — TROPONIN I (HIGH SENSITIVITY)
Troponin I (High Sensitivity): 66 ng/L — ABNORMAL HIGH (ref <18)
Troponin I (High Sensitivity): 56 ng/L — ABNORMAL HIGH (ref <18)

## 2020-03-26 LAB — C-REACTIVE PROTEIN: CRP: 31.8 mg/dL — ABNORMAL HIGH (ref <1.0)

## 2020-03-26 LAB — POTASSIUM: Potassium: 4.2 mmol/L (ref 3.5–5.1)

## 2020-03-26 LAB — MAGNESIUM: Magnesium: 2.3 mg/dL (ref 1.7–2.4)

## 2020-03-26 LAB — LACTIC ACID, PLASMA: Lactic Acid, Venous: 1.7 mmol/L (ref 0.5–1.9)

## 2020-03-26 MED ORDER — AMIODARONE HCL IN DEXTROSE 360-4.14 MG/200ML-% IV SOLN
INTRAVENOUS | Status: AC
  Filled 2020-03-26: qty 200

## 2020-03-26 MED ORDER — AMIODARONE HCL IN DEXTROSE 360-4.14 MG/200ML-% IV SOLN
30.0000 mg/h | INTRAVENOUS | Status: DC
  Administered 2020-03-27 – 2020-03-28 (×4): 30 mg/h via INTRAVENOUS
  Filled 2020-03-26 (×4): qty 200

## 2020-03-26 MED ORDER — POLYMYXIN B-TRIMETHOPRIM 10000-0.1 UNIT/ML-% OP SOLN
1.0000 [drp] | OPHTHALMIC | Status: DC
  Administered 2020-03-26 – 2020-03-28 (×11): 1 [drp] via OPHTHALMIC
  Filled 2020-03-26: qty 10

## 2020-03-26 MED ORDER — HYDROCORTISONE NA SUCCINATE PF 100 MG IJ SOLR
50.0000 mg | Freq: Four times a day (QID) | INTRAMUSCULAR | Status: DC
  Administered 2020-03-26 – 2020-03-28 (×10): 50 mg via INTRAVENOUS
  Filled 2020-03-26 (×10): qty 2

## 2020-03-26 MED ORDER — POLYMYXIN B-TRIMETHOPRIM 10000-0.1 UNIT/ML-% OP SOLN: 2.0000 [drp] | OPHTHALMIC | Status: DC

## 2020-03-26 MED ORDER — AMIODARONE LOAD VIA INFUSION
150.0000 mg | Freq: Once | INTRAVENOUS | Status: AC
  Administered 2020-03-26: 150 mg via INTRAVENOUS
  Filled 2020-03-26: qty 83.34

## 2020-03-26 MED ORDER — SODIUM CHLORIDE 0.9 % IV SOLN
0.0000 ug/min | INTRAVENOUS | Status: DC
  Administered 2020-03-26: 140 ug/min via INTRAVENOUS
  Administered 2020-03-26: 20 ug/min via INTRAVENOUS
  Administered 2020-03-27: 150 ug/min via INTRAVENOUS
  Administered 2020-03-27: 50 ug/min via INTRAVENOUS
  Administered 2020-03-27: 150 ug/min via INTRAVENOUS
  Filled 2020-03-26: qty 1
  Filled 2020-03-26: qty 10
  Filled 2020-03-26 (×2): qty 1
  Filled 2020-03-26 (×3): qty 10

## 2020-03-26 MED ORDER — METOPROLOL TARTRATE 5 MG/5ML IV SOLN: 2.5000 mg | Freq: Once | INTRAVENOUS | Status: AC

## 2020-03-26 MED ORDER — AMIODARONE HCL IN DEXTROSE 360-4.14 MG/200ML-% IV SOLN
60.0000 mg/h | INTRAVENOUS | Status: AC
  Administered 2020-03-26: 60 mg/h via INTRAVENOUS
  Filled 2020-03-26: qty 200

## 2020-03-26 MED ORDER — METOPROLOL TARTRATE 5 MG/5ML IV SOLN
INTRAVENOUS | Status: AC
  Administered 2020-03-26: 2.5 mg via INTRAVENOUS
  Filled 2020-03-26: qty 5

## 2020-03-26 NOTE — Progress Notes (Signed)
CRITICAL CARE NOTE 9/12 admitted for septic shock MRSA bacteremia with sacral decub 9/13 post opresp failure, septic shock and sepsis 9/13 Liquified necrotic fat was encountered. A pocket of pus was cultured. excise tissue until all visibly necrotic areas were excised down to bleeding tissue. The sacral bone was exposed. A rongeur was used to take bone biopsies for culture and histopathology. 9/14 post op resp failure due to septic shock 9/15 severe septic shock   CC  follow up respiratory failure  HPI Patient remains critically ill Prognosis is guarded Multiorgan failure Acute LEFT TENSION PTX CHEST TUBE PLACED EMERGENTLY  Prognosis is grave and poor Very LOW chance of meaningful recovery Recommend comfort care status with progressive end stage Parkinson disease    BP 137/69   Pulse (!) 125   Temp 98.6 F (37 C)   Resp 15   Ht 5' 10.98" (1.803 m)   Wt 77.2 kg   SpO2 91%   BMI 23.75 kg/m    I/O last 3 completed shifts: In: 3656.2 [I.V.:2956.2; IV [id number]:381] Out: 2665 [Urine:1890; Chest Tube:775] No intake/output data recorded.  SpO2: 91 % O2 Flow Rate (L/min): 3 L/min FiO2 (%): 55 %  Estimated body mass index is 23.75 kg/m as calculated from the following:   Height as of this encounter: 5' 10.98" (1.803 m).   Weight as of this encounter: 77.2 kg.  SIGNIFICANT EVENTS   REVIEW OF SYSTEMS  PATIENT IS UNABLE TO PROVIDE COMPLETE REVIEW OF SYSTEMS DUE TO SEVERE CRITICAL ILLNESS   Pressure Injury 01/08/18 Stage I -  Intact skin with non-blanchable redness of a localized area usually over a bony prominence. Bilateral unblanchable redness on buttocks (Active)  01/08/18 2351  Location: Buttocks  Location Orientation: Medial;Bilateral  Staging: Stage I -  Intact skin with non-blanchable redness of a localized area usually over a bony prominence.  Wound Description (Comments): Bilateral unblanchable redness on buttocks  Present on Admission: Yes      Pressure Injury 03/20/2020 Sacrum Right;Left;Posterior Unstageable - Full thickness tissue loss in which the base of the injury is covered by slough (yellow, tan, gray, green or brown) and/or eschar (tan, brown or black) in the wound bed. Pt with necrosis  (Active)  03/27/2020 1744  Location: Sacrum  Location Orientation: Right;Left;Posterior  Staging: Unstageable - Full thickness tissue loss in which the base of the injury is covered by slough (yellow, tan, gray, green or brown) and/or eschar (tan, brown or black) in the wound bed.  Wound Description (Comments): Pt with necrosis on sacral wound   Present on Admission:       PHYSICAL EXAMINATION:  GENERAL:critically ill appearing, +resp distress PULMONARY: +rhonchi, +wheezing LEFT CHEST TUBE CARDIOVASCULAR: S1 and S2. Regular rate and rhythm. No murmurs, rubs, or gallops.  GASTROINTESTINAL: Soft, nontender, -distended.  Positive bowel sounds.   MUSCULOSKELETAL: No swelling, clubbing, or edema.  NEUROLOGIC: obtunded, GCS<8 SKIN:stage 4 sacral wound, wound vac  MEDICATIONS: I have reviewed all medications and confirmed regimen as documented   CULTURE RESULTS   Recent Results (from the past 240 hour(s))  Blood Culture (routine x 2)     Status: Abnormal   Collection Time: 03/27/2020  5:43 PM   Specimen: BLOOD  Result Value Ref Range Status   Specimen Description   Final    BLOOD LEFT FORE ARM Performed at [facility], [address] [place], [place] [address]    Special Requests   Final    BOTTLES DRAWN AEROBIC AND ANAEROBIC Blood Culture adequate  volume Performed at [facility], [address] [place], [place] [address]    Culture  Setup Time   Final    GRAM POSITIVE COCCI IN BOTH AEROBIC AND ANAEROBIC BOTTLES CRITICAL VALUE NOTED.  VALUE IS CONSISTENT WITH PREVIOUSLY REPORTED AND CALLED VALUE. Performed at [facility], [address] [place], [place] [address]    Culture (A)  Final     STAPHYLOCOCCUS AUREUS SUSCEPTIBILITIES PERFORMED ON PREVIOUS CULTURE WITHIN THE LAST 5 DAYS. Performed at [facility], [place] [address], [place], [place] [address]    Report Status 03/24/2020 FINAL  Final  SARS Coronavirus 2 by RT PCR (hospital order, performed in [facility] hospital lab) Nasopharyngeal Nasopharyngeal Swab     Status: None   Collection Time: 03/16/2020  5:43 PM   Specimen: Nasopharyngeal Swab  Result Value Ref Range Status   SARS Coronavirus 2 NEGATIVE NEGATIVE Final    Comment: (NOTE) SARS-CoV-2 target nucleic acids are NOT DETECTED.  The SARS-CoV-2 RNA is generally detectable in upper and lower respiratory specimens during the acute phase of infection. The lowest concentration of SARS-CoV-2 viral copies this assay can detect is 250 copies / mL. A negative result does not preclude SARS-CoV-2 infection and should not be used as the sole basis for treatment or other patient management decisions.  A negative result may occur with improper specimen collection / handling, submission of specimen other than nasopharyngeal swab, presence of viral mutation(s) within the areas targeted by this assay, and inadequate number of viral copies (<250 copies / mL). A negative result must be combined with clinical observations, patient history, and epidemiological information.  Fact Sheet for Patients:   StrictlyIdeas.no  Fact Sheet for Healthcare Providers: BankingDealers.co.za  This test is not yet approved or  cleared by the [place] FDA and has been authorized for detection and/or diagnosis of SARS-CoV-2 by FDA under an Emergency Use Authorization (EUA).  This EUA will remain in effect (meaning this test can be used) for the duration of the COVID-19 declaration under Section 564(b)(1) of the Act, 21 U.S.C. section 360bbb-3(b)(1), unless the authorization is terminated or revoked sooner.  Performed at [facility], [address], [place], [place] [address]   Blood Culture (routine x 2)     Status: Abnormal   Collection Time: 03/18/2020  5:44 PM   Specimen: BLOOD  Result Value Ref Range Status   Specimen Description   Final    BLOOD RIGHT FORE ARM Performed at [facility], [address], [place], [place] [address]    Special Requests   Final    BOTTLES DRAWN AEROBIC AND ANAEROBIC Blood Culture adequate volume Performed at [facility], [address] [place],  [address]    Culture  Setup Time   Final    Organism ID to follow GRAM POSITIVE COCCI IN BOTH AEROBIC AND ANAEROBIC BOTTLES CRITICAL RESULT CALLED TO, READ BACK BY AND VERIFIED WITH: DAVID BESANTI AT 0607 03/22/20 [place] Performed at [facility], [address] [place],  [address]    Culture METHICILLIN RESISTANT STAPHYLOCOCCUS AUREUS (A)  Final   Report Status 03/24/2020 FINAL  Final   Organism ID, Bacteria METHICILLIN RESISTANT STAPHYLOCOCCUS AUREUS  Final      Susceptibility   Methicillin resistant staphylococcus aureus - MIC*    CIPROFLOXACIN >=8 RESISTANT Resistant     ERYTHROMYCIN >=8 RESISTANT Resistant     GENTAMICIN <=0.5 SENSITIVE Sensitive     OXACILLIN >=4 RESISTANT Resistant  TETRACYCLINE <=1 SENSITIVE Sensitive     VANCOMYCIN 1 SENSITIVE Sensitive     TRIMETH/SULFA <=10 SENSITIVE Sensitive     CLINDAMYCIN <=0.25 SENSITIVE Sensitive     RIFAMPIN <=0.5 SENSITIVE Sensitive     Inducible Clindamycin NEGATIVE Sensitive     * METHICILLIN RESISTANT STAPHYLOCOCCUS AUREUS  Blood Culture ID Panel (Reflexed)     Status: Abnormal   Collection Time: 04/02/2020  5:44 PM  Result Value Ref Range Status   Enterococcus faecalis NOT DETECTED NOT DETECTED Final   Enterococcus Faecium NOT DETECTED NOT DETECTED Final   Listeria monocytogenes NOT DETECTED NOT DETECTED Final   Staphylococcus species DETECTED (A) NOT DETECTED Final    Comment: CRITICAL RESULT CALLED TO, READ BACK BY AND  VERIFIED WITH:  DAVID BESANTI AT 0607 03/22/20 SDR    Staphylococcus aureus (BCID) DETECTED (A) NOT DETECTED Final    Comment: Methicillin (oxacillin)-resistant Staphylococcus aureus (MRSA). MRSA is predictably resistant to beta-lactam antibiotics (except ceftaroline). Preferred therapy is vancomycin unless clinically contraindicated. Patient requires contact precautions if  hospitalized. CRITICAL RESULT CALLED TO, READ BACK BY AND VERIFIED WITH:  DAVID BESANTI AT 0607 03/22/20 SDR    Staphylococcus epidermidis NOT DETECTED NOT DETECTED Final   Staphylococcus lugdunensis NOT DETECTED NOT DETECTED Final   Streptococcus species NOT DETECTED NOT DETECTED Final   Streptococcus agalactiae NOT DETECTED NOT DETECTED Final   Streptococcus pneumoniae NOT DETECTED NOT DETECTED Final   Streptococcus pyogenes NOT DETECTED NOT DETECTED Final   A.calcoaceticus-baumannii NOT DETECTED NOT DETECTED Final   Bacteroides fragilis NOT DETECTED NOT DETECTED Final   Enterobacterales NOT DETECTED NOT DETECTED Final   Enterobacter cloacae complex NOT DETECTED NOT DETECTED Final   Escherichia coli NOT DETECTED NOT DETECTED Final   Klebsiella aerogenes NOT DETECTED NOT DETECTED Final   Klebsiella oxytoca NOT DETECTED NOT DETECTED Final   Klebsiella pneumoniae NOT DETECTED NOT DETECTED Final   Proteus species NOT DETECTED NOT DETECTED Final   Salmonella species NOT DETECTED NOT DETECTED Final   Serratia marcescens NOT DETECTED NOT DETECTED Final   Haemophilus influenzae NOT DETECTED NOT DETECTED Final   Neisseria meningitidis NOT DETECTED NOT DETECTED Final   Pseudomonas aeruginosa NOT DETECTED NOT DETECTED Final   Stenotrophomonas maltophilia NOT DETECTED NOT DETECTED Final   Candida albicans NOT DETECTED NOT DETECTED Final   Candida auris NOT DETECTED NOT DETECTED Final   Candida glabrata NOT DETECTED NOT DETECTED Final   Candida krusei NOT DETECTED NOT DETECTED Final   Candida parapsilosis NOT DETECTED NOT  DETECTED Final   Candida tropicalis NOT DETECTED NOT DETECTED Final   Cryptococcus neoformans/gattii NOT DETECTED NOT DETECTED Final   Meth resistant mecA/C and MREJ DETECTED (A) NOT DETECTED Final    Comment: CRITICAL RESULT CALLED TO, READ BACK BY AND VERIFIED WITH:  DAVID BESANTI AT 0607 03/22/20 SDR Performed at [facility], [address] [place], [place] [address]   Urine culture     Status: Abnormal   Collection Time: 03/13/2020  6:52 PM   Specimen: Urine, Random  Result Value Ref Range Status   Specimen Description   Final    URINE, RANDOM Performed at [facility], [address] [place], [place] [address]    Special Requests   Final    NONE Performed at [facility], [address] [place], [place] [address]    Culture MULTIPLE SPECIES PRESENT, SUGGEST RECOLLECTION (A)  Final   Report Status 03/26/2020 FINAL  Final  MRSA PCR Screening  Status: Abnormal   Collection Time: 03/22/20  8:30 AM   Specimen: Nasopharyngeal  Result Value Ref Range Status   MRSA by PCR POSITIVE (A) NEGATIVE Final    Comment:        The GeneXpert MRSA Assay (FDA approved for NASAL specimens only), is one component of a comprehensive MRSA colonization surveillance program. It is not intended to diagnose MRSA infection nor to guide or monitor treatment for MRSA infections. RESULT CALLED TO, READ BACK BY AND VERIFIED WITH:  [facility] WHITE AT 1007 03/22/20 SDR Performed at [facility], [address] [place], [place] [address]   Aerobic/Anaerobic Culture (surgical/deep wound)     Status: None (Preliminary result)   Collection Time: 03/18/2020  6:14 PM   Specimen: PATH Other; Wound  Result Value Ref Range Status   Specimen Description   Final    WOUND Performed at [facility], [address] [place], [place] [address]    Special Requests   Final    DEEP TISSUE SACRAL CULTURE Performed at [facility], [address]  [place], [place] [address]    Gram Stain   Final    NO WBC SEEN ABUNDANT GRAM POSITIVE COCCI IN PAIRS FEW GRAM NEGATIVE RODS    Culture   Final    MODERATE STAPHYLOCOCCUS AUREUS CULTURE REINCUBATED FOR BETTER GROWTH Performed at [facility], [place] [address], [place], [place] [address]    Report Status PENDING  Incomplete  Aerobic/Anaerobic Culture (surgical/deep wound)     Status: None (Preliminary result)   Collection Time: 03/19/2020  6:14 PM   Specimen: PATH Other; Wound  Result Value Ref Range Status   Specimen Description   Final    WOUND Performed at [facility], [address] [place], [place] [address]    Special Requests   Final    DEEP SACRAL BONE CULTURE Performed at [facility], [address] [place], [place] [address]    Gram Stain NO WBC SEEN NO ORGANISMS SEEN   Final   Culture   Final    CULTURE REINCUBATED FOR BETTER GROWTH Performed at [facility], [place] [address], [place], [place] [address]    Report Status PENDING  Incomplete  Culture, blood (Routine X 2) w Reflex to ID Panel     Status: None (Preliminary result)   Collection Time: 03/18/2020 10:12 PM   Specimen: BLOOD  Result Value Ref Range Status   Specimen Description BLOOD RIGHT HAND  Final   Special Requests   Final    BOTTLES DRAWN AEROBIC AND ANAEROBIC Blood Culture adequate volume   Culture   Final    NO GROWTH 3 DAYS Performed at [facility], [address] [place], [place] [address]    Report Status PENDING  Incomplete  Culture, blood (Routine X 2) w Reflex to ID Panel     Status: None (Preliminary result)   Collection Time: 03/20/2020 10:13 PM   Specimen: BLOOD  Result Value Ref Range Status   Specimen Description BLOOD LEFT HAND  Final   Special Requests   Final    BOTTLES DRAWN AEROBIC AND ANAEROBIC Blood Culture adequate volume   Culture   Final    NO GROWTH 3 DAYS Performed at [facility], [address], [place], [place]  [address]    Report Status PENDING  Incomplete          IMAGING    DG Chest 1 View  Addendum Date:  04/09/2020   ADDENDUM REPORT: 03/11/2020 22:50 ADDENDUM: Critical Value/emergent results were called by telephone at the time of interpretation on 03/12/2020 At 2230 hours to provider [facility] , who verbally acknowledged these results. Electronically Signed   By: Genevie Ann M.D.   On: 03/29/2020 22:50   Result Date: 03/31/2020 CLINICAL DATA:  65 year old male with respiratory failure. Septic emboli versus atypical infection suspected on recent chest CT. EXAM: CHEST  1 VIEW COMPARISON:  Chest CT 03/22/2020 and earlier. FINDINGS: Portable AP semi upright view at 2213 hours. The patient is intubated now. Endotracheal tube tip at the level the clavicles. Enteric tube courses to the abdomen, tip not included. Large left pneumothorax is new, with mild rightward mediastinal shift compatible with tension. Superimposed widespread patchy bilateral pulmonary opacity. Superimposed veiling opacity in both lungs compatible with pleural effusions which may have increased from the recent CT. No acute osseous abnormality identified. IMPRESSION: 1. Positive for new left side Tension Pneumothorax. 2. Endotracheal tube and enteric tube placement appears satisfactory. 3. Underlying bilateral pneumonia and pleural effusions. Electronically Signed: By: Genevie Ann M.D. On: 04/05/2020 22:28   DG CHEST PORT 1 VIEW  Result Date: 03/26/2020 CLINICAL DATA:  Chest tube placement EXAM: PORTABLE CHEST 1 VIEW COMPARISON:  03/27/2020 FINDINGS: Endotracheal tube tip is about 6 cm superior to the carina. Esophageal tube tip below the diaphragm but incompletely visualized. Additional tube port catheter tip projecting over the mid to upper mediastinum. Interval placement of left-sided chest tube with tip at the pulmonary apex. Decreased left pneumothorax with small residual apicolateral pneumothorax visible. Small left and moderate right  pleural effusion. Extensive bilateral pulmonary infiltrates with worsening consolidation at the right base. Stable cardiomediastinal silhouette. New left chest wall emphysema. IMPRESSION: 1. Interval placement of left-sided chest tube with decreased size of left pneumothorax. Small residual left apicolateral pneumothorax. New left chest wall emphysema. 2. Extensive bilateral pulmonary infiltrates with worsening consolidation at the right base. Small left and moderate right pleural effusions. Electronically Signed   By: Donavan Foil M.D.   On: 03/26/2020 00:20     Nutrition Status: Nutrition Problem: Severe Malnutrition Etiology: chronic illness (parkinson's) Signs/Symptoms: moderate fat depletion, severe fat depletion, moderate muscle depletion, severe muscle depletion Interventions: Ensure Enlive (each supplement provides 350kcal and 20 grams of protein), MVI, Magic cup     Indwelling Urinary Catheter continued, requirement due to   Reason to continue Indwelling Urinary Catheter strict Intake/Output monitoring for hemodynamic instability   Central Line/ continued, requirement due to  Reason to continue [place] of central venous pressure or other hemodynamic parameters and poor IV access   Ventilator continued, requirement due to severe respiratory failure   Ventilator Sedation RASS 0 to -2      ASSESSMENT AND PLAN SYNOPSIS SEVERE SEPSIS MRSA BACTEREMIA WITH END STAGE PARKINSONS DISEASE GANGRENOUS SACRAL WOUND/OSTEO with POST OP RESP FAILURE AND SEPTIC SHOCK-PROGNOSIS IS VERY POOR, VERY POOR CHANCE OF MEANINGFUL RECOVERY, COMPLICATED BY ACUTE LEFT PTX FROM UNDERLYING MRSA CAVITARY LUNG LESIONS   Severe ACUTE Hypoxic and Hypercapnic Respiratory Failure -continue Full MV support -continue Bronchodilator Therapy -Wean Fio2 and PEEP as tolerated -VAP/VENT bundle implementation   NEUROLOGY Acute toxic metabolic encephalopathy, need for sedation Goal RASS -2 to  -3  SHOCK-SEPSIS -use vasopressors to keep MAP>65  CARDIAC ICU monitoring  ID MRSA BACTEREMIA -continue IV abx as prescibed -follow up cultures SACRAL WOUND GENERAL SURGERY FOLLOWING  GI GI PROPHYLAXIS as indicated  NUTRITIONAL STATUS Nutrition Status: Nutrition Problem: Severe Malnutrition  Etiology: chronic illness (parkinson's) Signs/Symptoms: moderate fat depletion, severe fat depletion, moderate muscle depletion, severe muscle depletion Interventions: Ensure Enlive (each supplement provides 350kcal and 20 grams of protein), MVI, Magic cup   DIET-->TF's as tolerated Constipation protocol as indicated  ENDO - will use ICU hypoglycemic\Hyperglycemia protocol if indicated     ELECTROLYTES -follow labs as needed -replace as needed -pharmacy consultation and following   DVT/GI PRX ordered and assessed TRANSFUSIONS AS NEEDED MONITOR FSBS I Assessed the need for Labs I Assessed the need for Foley I Assessed the need for Central Venous Line Family Discussion when available I Assessed the need for Mobilization I made an Assessment of medications to be adjusted accordingly Safety Risk assessment completed   CASE DISCUSSED IN MULTIDISCIPLINARY ROUNDS WITH ICU TEAM  Critical Care Time devoted to patient care services described in this note is 45 minutes.   Overall, patient is critically ill, prognosis is guarded.  Patient with Multiorgan failure and at high risk for cardiac arrest and death.  PATIENT WITH GRAVE PROGNOSIS PATIENT WILL NOT LIKELY SURVIVE THIS ADMISSION PATIENT IS SUFFERING AND DYING PROCESS  DNR status, recommend COMFORT CARE MEASURES  Keenan Dimitrov Patricia Pesa, M.D.  [facility]  Medical Director [place] Director [facility] Cardio-Pulmonary Department

## 2020-03-26 NOTE — Progress Notes (Addendum)
[facility] Hospital Day(s): 5.   Post op day(s): 3 Days Post-Op.   Interval History:  Patient seen and examined Overnight became hypoxic, tachycardic, and hypotensive and was found to have left sided tension pneumothorax, Dr. Lysle Pearl placed chest tube Patient remains intubated, requiring levophed at 16 mcg/min; off physiologic vasopressin at time of my examination Leukocytosis improved some to 19.6K Still with elevated renal function, sCr - 2.23, UO - 1.1L Hypernatremic to 151 Still acidotic on ABG with pH - 7.20, pCO2 - 57 Chest tube with 775 ccs out, serosanguinous OR Cx growing staph aureus, pathology negative for osteomyelitis Remains on IV Unasyn; ID following  Vital signs in last 24 hours: [min-max] current  Temp:  [97.2 F (36.2 C)-100.4 F (38 C)] 98.6 F (37 C) (09/16 0200) Pulse Rate:  [96-140] 125 (09/16 0200) Resp:  [13-24] 15 (09/16 0200) BP: (68-143)/(45-121) 137/69 (09/16 0200) SpO2:  [88 %-99 %] 91 % (09/16 0545) FiO2 (%):  [45 %-100 %] 55 % (09/16 0545) Weight:  [77.2 kg] 77.2 kg (09/16 0437)     Height: 5' 10.98" (180.3 cm) Weight: 77.2 kg BMI (Calculated): 23.75   Intake/Output last 2 shifts:  09/15 0701 - 09/16 0700 In: 2024.4 [I.V.:1522.7; IV Piggyback:501.7] Out: 1940 [Urine:1165; Chest Tube:775]   Physical Exam:  Constitutional: Intubated, sedated  Respiratory: On Ventilator Cardiovascular: Tachycardic and sinus rhythm  Chest: Chest tube to the left lateral chest wall, site appears CDI, there is a persistent air leak present, output serosanguinous Musculoskeletal: Upper and lower extremities are edematous Genitourinary: Foley in place  Integumentary: Dressing to sacral wound  Labs:  CBC Latest Ref Rng & Units 03/26/2020 04/03/2020 03/29/2020  WBC 4.0 - 10.5 K/uL 19.6(H) 23.0(H) 14.8(H)  Hemoglobin 13.0 - 17.0 g/dL 8.7(L) 8.8(L) 9.8(L)  Hematocrit 39 - 52 % 28.4(L) 27.9(L) 29.1(L)  Platelets 150 - 400 K/uL  40(L) 51(L) 51(L)   CMP Latest Ref Rng & Units 03/26/2020 03/24/2020 03/30/2020  Glucose 70 - 99 mg/dL 120(H) 129(H) 105(H)  BUN 8 - 23 mg/dL 75(H) 78(H) 83(H)  Creatinine 0.61 - 1.24 mg/dL 2.23(H) 2.20(H) 2.42(H)  Sodium 135 - 145 mmol/L 151(H) 150(H) 146(H)  Potassium 3.5 - 5.1 mmol/L 4.5 4.2 3.5  Chloride 98 - 111 mmol/L 120(H) 120(H) 114(H)  CO2 22 - 32 mmol/L 23 21(L) 20(L)  Calcium 8.9 - 10.3 mg/dL 7.6(L) 7.4(L) 7.7(L)  Total Protein 6.5 - 8.1 g/dL - - -  Total Bilirubin 0.3 - 1.2 mg/dL - - -  Alkaline Phos 38 - 126 U/L - - -  AST 15 - 41 U/L - - -  ALT 0 - 44 U/L - - -     Imaging studies:   CXR (04/05/2020) personally reviewed which shows large left sided pneumothorax with tracheal shift, and radiologist report reviewed:  IMPRESSION: 1. Positive for new left side tension pneumothorax. 2. Endotracheal tube and enteric tube placement appears satisfactory. 3. Underlying bilateral pneumonia and pleural effusions.  CXR (03/26/2020) personally reviewed which shows interval placement of left chest tube, resolution of PTX, and radiologist report reviewed:  IMPRESSION: 1. Interval placement of left-sided chest tube with decreased size of left pneumothorax. Small residual left apico-lateral pneumothorax. New left chest wall emphysema. 2. Extensive bilateral pulmonary infiltrates with worsening consolidation at the right base. Small left and moderate right pleural effusions.   Assessment/Plan:  64 y.o. critically ill and severely debilitated male  3 Days Post-Op s/p excisional debridment of skin, soft tissue, muscle totaling 208 sq  cm; biopsy of sacral bone for unstageable sacral decubitus ulceration, complicated by left sided tension pneumothorax overnight and subsequent placement of chest tube (09/16 - Dr Lysle Pearl).   - I will be happy to preform dressing change this afternoon. Plan to continue wet-to-dry dressings with 4x4 gauze vs Kerlix. I do not think the surrounding skin is  amenable to wound vac draping giving degree of breakdown and proximity to rectum. He has also not tolerated prolonged positional changes well on previous attempts at dressing changes.    - Continue chest tube to -20 cm suction; monitor and record output  - Daily CXR as needed  - Continue IV Abx (Unasyn + Vancomycin); ID following              - Continue supportive care per PCCM.  After today's dressing change, I think his wound care can be assumed by the nursing staff.  He is unlikely to tolerate any further surgical intervention for this wound, so we will sign off.    All of the above findings and recommendations were discussed with the medical team.  -- Edison Simon, PA-C [facility] 03/26/2020, 7:36 AM [phone] M-F: 7am - 4pm  I saw and evaluated the patient.  I agree with the above documentation, exam, and plan, which I have edited where appropriate. Ossie Yebra  1:32 PM

## 2020-03-26 NOTE — Progress Notes (Signed)
ID  BP 128/70   Pulse (!) 107   Temp (!) 97.3 F (36.3 C)   Resp 18   Ht 5' 10.98" (1.803 m)   Wt 77.2 kg   SpO2 92%   BMI 23.75 kg/m     Had tension pneumothorax last night and had a chest drain placed on the left side Intubated B/l air entry Tachycardia Abd soft Foley- debris B/l upper extremities edema Rt femoral triple lumen .  CBC Latest Ref Rng & Units 03/26/2020 03/20/2020 03/26/2020  WBC 4.0 - 10.5 K/uL 19.6(H) 23.0(H) 14.8(H)  Hemoglobin 13.0 - 17.0 g/dL 8.7(L) 8.8(L) 9.8(L)  Hematocrit 39 - 52 % 28.4(L) 27.9(L) 29.1(L)  Platelets 150 - 400 K/uL 40(L) 51(L) 51(L)          Microbiology: MRSA in blood culture 03/15/2020 MRSA sacral wound culture Bone culture ng so far     Assessment/Plan: ?MRSA bacteremia with sepsis Bilateral cavitating infiltrates in the lung concerning for septic emboli with MRSA. Patient will need TEE once stable  repeat blood culture from 9/13 neg so far Continue vancomycin  Sacral decubitus -underwent debridement- stage IV. Culture sent- continue unasyn for now On low dose levophed Intubated post surgery  Tension pneumothorax left- s/p chest drain  AKI- due to infection- improving  Anemia   Parkinsons disease on carbidopa-levodopa  Discussed with care team

## 2020-03-27 LAB — RENAL FUNCTION PANEL
Albumin: 1.3 g/dL — ABNORMAL LOW (ref 3.5–5.0)
Anion gap: 11 (ref 5–15)
BUN: 54 mg/dL — ABNORMAL HIGH (ref 8–23)
CO2: 23 mmol/L (ref 22–32)
Calcium: 7.5 mg/dL — ABNORMAL LOW (ref 8.9–10.3)
Chloride: 118 mmol/L — ABNORMAL HIGH (ref 98–111)
Creatinine, Ser: 1.52 mg/dL — ABNORMAL HIGH (ref 0.61–1.24)
GFR calc Af Amer: 55 mL/min — ABNORMAL LOW (ref >60)
GFR calc non Af Amer: 47 mL/min — ABNORMAL LOW (ref >60)
Glucose, Bld: 191 mg/dL — ABNORMAL HIGH (ref 70–99)
Phosphorus: 4.3 mg/dL (ref 2.5–4.6)
Potassium: 4.2 mmol/L (ref 3.5–5.1)
Sodium: 152 mmol/L — ABNORMAL HIGH (ref 135–145)

## 2020-03-27 LAB — CBC WITH DIFFERENTIAL/PLATELET
Abs Immature Granulocytes: 0.53 10*3/uL — ABNORMAL HIGH (ref 0.00–0.07)
Basophils Absolute: 0.1 10*3/uL (ref 0.0–0.1)
Basophils Relative: 0 %
Eosinophils Absolute: 0 10*3/uL (ref 0.0–0.5)
Eosinophils Relative: 0 %
HCT: 29.3 % — ABNORMAL LOW (ref 39.0–52.0)
Hemoglobin: 9 g/dL — ABNORMAL LOW (ref 13.0–17.0)
Immature Granulocytes: 3 %
Lymphocytes Relative: 4 %
Lymphs Abs: 0.7 10*3/uL (ref 0.7–4.0)
MCH: 30.2 pg (ref 26.0–34.0)
MCHC: 30.7 g/dL (ref 30.0–36.0)
MCV: 98.3 fL (ref 80.0–100.0)
Monocytes Absolute: 0.1 10*3/uL (ref 0.1–1.0)
Monocytes Relative: 1 %
Neutro Abs: 14.7 10*3/uL — ABNORMAL HIGH (ref 1.7–7.7)
Neutrophils Relative %: 92 %
Platelets: 56 10*3/uL — ABNORMAL LOW (ref 150–400)
RBC: 2.98 MIL/uL — ABNORMAL LOW (ref 4.22–5.81)
RDW: 18.6 % — ABNORMAL HIGH (ref 11.5–15.5)
Smear Review: DECREASED
WBC: 16.1 10*3/uL — ABNORMAL HIGH (ref 4.0–10.5)
nRBC: 0.1 % (ref 0.0–0.2)

## 2020-03-27 LAB — GLUCOSE, CAPILLARY
Glucose-Capillary: 147 mg/dL — ABNORMAL HIGH (ref 70–99)
Glucose-Capillary: 153 mg/dL — ABNORMAL HIGH (ref 70–99)
Glucose-Capillary: 155 mg/dL — ABNORMAL HIGH (ref 70–99)
Glucose-Capillary: 170 mg/dL — ABNORMAL HIGH (ref 70–99)
Glucose-Capillary: 179 mg/dL — ABNORMAL HIGH (ref 70–99)
Glucose-Capillary: 180 mg/dL — ABNORMAL HIGH (ref 70–99)
Glucose-Capillary: 182 mg/dL — ABNORMAL HIGH (ref 70–99)

## 2020-03-27 LAB — VANCOMYCIN, TROUGH: Vancomycin Tr: 20 ug/mL (ref 15–20)

## 2020-03-27 LAB — MAGNESIUM: Magnesium: 2.3 mg/dL (ref 1.7–2.4)

## 2020-03-27 MED ORDER — INSULIN ASPART 100 UNIT/ML ~~LOC~~ SOLN
0.0000 [IU] | SUBCUTANEOUS | Status: DC
  Administered 2020-03-27 – 2020-03-28 (×2): 3 [IU] via SUBCUTANEOUS
  Administered 2020-03-28: 2 [IU] via SUBCUTANEOUS
  Filled 2020-03-27 (×4): qty 1

## 2020-03-27 MED ORDER — PHENYLEPHRINE CONCENTRATED 100MG/250ML (0.4 MG/ML) INFUSION SIMPLE
0.0000 ug/min | INTRAVENOUS | Status: DC
  Administered 2020-03-27: 80 ug/min via INTRAVENOUS
  Filled 2020-03-27: qty 250

## 2020-03-27 NOTE — Progress Notes (Signed)
Brief Progress Note Discussed patient with primary RN this morning. They were able to preform dressing changes without issues last night. It does appear his atrium had tipped over and he continues to have a persistent air leak. I offered to provide a new atrium however it sounds like family is coming today to transition to comfort measures only.   For now, continue chest tube to suction while ventilated  We will sign off but be available if needed  -- Edison Simon, PA-C [facility] 03/27/2020, 8:51 AM [phone] M-F: 7am - 4pm

## 2020-03-27 NOTE — Progress Notes (Signed)
Nutrition Follow-up  DOCUMENTATION CODES:   Severe malnutrition in context of chronic illness  INTERVENTION:  Family is coming in today to see patient and possibly transition to comfort care so plan is to hold off on tube feeds at this time.  If tube feeds are initiated recommend: -Initiate Vital 1.5 Cal at 15 mL/hr and advance by 20 mL/hr every 12 hours to goal rate of 55 mL/hr (1320 mL goal daily volume) per tube -PROSource TF 45 mL BID per tube -Goal regimen provides 2060 kcal, 111 grams of protein, 1003 mL H2O daily  Monitor magnesium, potassium, and phosphorus daily for at least 3 days, MD to replete as needed, as pt is at risk for refeeding syndrome.  NUTRITION DIAGNOSIS:   Severe Malnutrition related to chronic illness (parkinson's) as evidenced by moderate fat depletion, severe fat depletion, moderate muscle depletion, severe muscle depletion.  Ongoing.  GOAL:   Patient will meet greater than or equal to 90% of their needs  Not met.  MONITOR:   Vent status, Labs, Weight trends, I & O's, Skin  REASON FOR ASSESSMENT:   Consult Assessment of nutrition requirement/status  ASSESSMENT:   64 year old male with history of Parkinson's disease, previous stroke, hyperlipidemia, BPH, malnutrition, vitamin D deficiency, severe depression, s/p ECT treatment (2019) and anxiety who is being admitted with working diagnosis of multifocal pneumonia with cavitary lesions, concern for atypical infection, rule out TB, septic shock requiring low-dose vasopressors and severe AKI.  9/13 intubated 9/13 s/p excisional debridement of skin, soft tissue, muscle totaling 208 sq cm + biopsy of sacral bone 9/15 s/p left thoracostomy tube placement  Patient is currently intubated on ventilator support MV: 7 L/min Temp (24hrs), Avg:97.7 F (36.5 C), Min:97 F (36.1 C), Max:98.6 F (37 C)  Medications reviewed and include: Solu-Cortef 50 mg Q6hrs IV, ergocalciferol 50000 units every Monday,  Unasyn, D10 at 50 mL/hr, fentanyl gtt, phenylephrine gtt at 140 mcg/min.  Labs reviewed: CBG 127-179, Sodium 152, Chloride 118, BUN 54, Creatinine 1.52.  I/O: 2045 mL UOP yesterday (1.1 mL/kg/hr)  Weight trend: 78.2 kg on 9/17; +7.2 kg from 9/12  Enteral Access: OGT; terminates in gastric body per abdominal x-ray 9/13  Discussed with RN and on rounds. Pressor dose decreasing now. However family coming in to see patient and possibly transition to comfort care. Plan is to hold off on tube feeds for now.   Diet Order:   Diet Order            Diet NPO time specified  Diet effective midnight                EDUCATION NEEDS:   Not appropriate for education at this time  Skin:  Skin Assessment: Skin Integrity Issues: Skin Integrity Issues:: Stage I, Unstageable, Incisions Stage I: bilateral buttocks Unstageable: sacrum (9cm x 7.5cm x 0.5cm) Incisions: closed incision to buttocks  Last BM:  04/02/2020 per chart  Height:   Ht Readings from Last 1 Encounters:  03/26/20 5' 10.98" (1.803 m)   Weight:   Wt Readings from Last 1 Encounters:  03/27/20 78.2 kg   Ideal Body Weight:  78.18 kg  BMI:  Body mass index is 24.06 kg/m.  Estimated Nutritional Needs:   Kcal:  2130  Protein:  105-120g/day  Fluid:  >1.9L/day  Jacklynn Barnacle, MS, RD, LDN Pager number available on Amion

## 2020-03-27 NOTE — Progress Notes (Signed)
Daily Progress Note   Patient Name: [name]       Date: 03/27/2020 DOB: 07/05/1956  Age: 64 y.o. MRN#: [id number] Attending Physician: Flora Lipps, MD Primary Care Physician: Doreen Beam, FNP Admit Date: 04/01/2020  Reason for Consultation/Follow-up: Establishing goals of care  Subjective: Patient is resting in bed on ventilator. Spoke with wife. She states she and her grandson do not want patient to suffer. She discusses his QOL now and moving forward. She discusses shift to comfort care, but inability to actually request implementing withdrawal of care. She states her grandson remains hopeful, and does not want to hear any negative news, but feels that he is begining to accept the situation as he is blaming himself for his grandfather's condition. in hope for [name]'s acceptance, she would like CCM to speak with them to address and implement next steps, and requests that palliative remain engaged for her support and benefit. She requests palliative to continue exclusive interaction with her.     Length of Stay: 6  Current Medications: Scheduled Meds:  . carbidopa-levodopa  1 tablet Oral TID  . chlorhexidine  15 mL Mouth Rinse BID  . Chlorhexidine Gluconate Cloth  6 each Topical Daily  . Chlorhexidine Gluconate Cloth  6 each Topical Q0600  . heparin  5,000 Units Subcutaneous Q8H  . hydrocortisone sod succinate (SOLU-CORTEF) inj  50 mg Intravenous Q6H  . lidocaine  5 mL Other Once  . mouth rinse  15 mL Mouth Rinse Q2H  . trimethoprim-polymyxin b  1 drop Right Eye Q4H  . Vitamin D (Ergocalciferol)  50,000 Units Oral Q Mon    Continuous Infusions: . sodium chloride 200 mL/hr at 04/01/2020 1832  . amiodarone 30 mg/hr (03/27/20 1246)  . ampicillin-sulbactam (UNASYN) IV 3 g  (03/27/20 1108)  . dextrose Stopped (03/27/20 1108)  . fentaNYL infusion INTRAVENOUS 150 mcg/hr (03/27/20 1108)  . norepinephrine (LEVOPHED) Adult infusion Stopped (03/26/20 2315)  . phenylephrine (NEO-SYNEPHRINE) Adult infusion Stopped (03/27/20 0313)  . phenylephrine Stopped (03/27/20 0754)  . vancomycin Stopped (03/26/20 1550)  . vasopressin Stopped (03/27/20 0408)    PRN Meds: acetaminophen **OR** acetaminophen, calcium carbonate, camphor-menthol **AND** hydrOXYzine, ondansetron **OR** ondansetron (ZOFRAN) IV, sorbitol  Physical Exam Constitutional:      Comments: On ventilator.   Musculoskeletal:     Right lower  leg: Edema present.     Left lower leg: Edema present.             Vital Signs: BP (!) 146/84   Pulse (!) 58   Temp (!) 97.3 F (36.3 C)   Resp (!) 23   Ht 5' 10.98" (1.803 m)   Wt 78.2 kg   SpO2 100%   BMI 24.06 kg/m  SpO2: SpO2: 100 % O2 Device: O2 Device: Ventilator O2 Flow Rate: O2 Flow Rate (L/min): 3 L/min  Intake/output summary:   Intake/Output Summary (Last 24 hours) at 03/27/2020 1310 Last data filed at 03/27/2020 1108 Gross per 24 hour  Intake 3833.94 ml  Output 2420 ml  Net 1413.94 ml   LBM: Last BM Date: 03/20/2020 Baseline Weight: Weight: 68 kg Most recent weight: Weight: 78.2 kg       Palliative Assessment/Data:      Patient Active Problem List   Diagnosis Date Noted  . Hyponatremia 03/22/2020  . Hypokalemia 03/22/2020  . FTT (failure to thrive) in adult 03/22/2020  . Thrombocytopenia ([place]) 03/22/2020  . Community acquired pneumonia 03/22/2020  . Sepsis ([place]) 03/22/2020  . Sacral decubitus ulcer, stage IV ([place]) 03/22/2020  . Severe sepsis ([place]) 03/22/2020  . AKI (acute kidney injury) ([place]) 03/16/2020  . Bradykinesia 11/22/2019  . Tremor 11/22/2019  . At risk for falling 11/22/2019  . Frequency of urination and polyuria 11/22/2019  . Weakness generalized 11/22/2019  . Primary Parkinsonism ([place]) 11/13/2019  . Hyperlipidemia  01/10/2019  . 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 ([name]) 12/27/2018  . Gait difficulty 12/25/2018  . Idiopathic peripheral neuropathy 12/25/2018  . Benign prostatic hyperplasia with urinary frequency 04/04/2018  . Erectile dysfunction 04/04/2018  . Vitamin D insufficiency 03/14/2018  . Weakness of back 03/14/2018  . Elevated CK 03/14/2018  . B12 deficiency 03/14/2018  . High risk medications (not anticoagulants) long-term use 03/14/2018  . Onychomycosis 03/14/2018  . Catatonia 01/12/2018  . Protein-calorie malnutrition, severe ([place]) 01/09/2018  . Pressure injury of skin 01/09/2018  . MDD (major depressive disorder), recurrent episode, severe ([place]) 01/09/2018  . Major depressive disorder, recurrent severe without psychotic features ([place]) 01/09/2018  . Back pain 01/08/2018  . Shuffling gait 01/08/2018    Palliative Care Assessment & Plan    Recommendations/Plan:  Continue current care. Per wife, discussing withdrawal of care, but cannot bring herself to implement the path.     Code Status:    Code Status Orders  (From admission, onward)         Start     Ordered   03/22/20 1322  Do not attempt resuscitation (DNR)  Continuous       Question Answer Comment  In the event of cardiac or respiratory ARREST Do not call a "code blue"   In the event of cardiac or respiratory ARREST Do not perform Intubation, CPR, defibrillation or ACLS   In the event of cardiac or respiratory ARREST Use medication by any route, position, wound care, and other measures to relive pain and suffering. May use oxygen, suction and manual treatment of airway obstruction as needed for comfort.      03/22/20 1323        Code Status History    Date Active Date Inactive Code Status Order ID Comments User Context   03/26/2020 2314 03/22/2020 1323 Full Code [id number]  Elwyn Reach, MD ED   01/12/2018 1331 02/08/2018 1731 Full Code [id number]  Clapacs, Madie Reno, MD  Inpatient  01/08/2018 2350 01/12/2018 1308 Full Code [id number]  Lance Coon, MD Inpatient   Advance Care Planning Activity       Prognosis:  Very poor.    Thank you for allowing the Palliative Medicine Team to assist in the care of this patient.   Total Time 35 min Prolonged Time Billed no       Greater than 50%  of this time was spent counseling and coordinating care related to the above assessment and plan.  Asencion Gowda, NP  Please contact Palliative Medicine Team phone at [phone] for questions and concerns.

## 2020-03-27 NOTE — Progress Notes (Signed)
Pharmacy Antibiotic Note  [name] is a 64 y.o. male admitted on 03/20/2020 with pneumonia/MRSA bacteremia/sacral decubutis.  Pharmacy has been consulted for vancomycin/Unasyn dosing. ID is following.   Today, 03/27/20  Day #6 antibiotics: vancomycin and ampicillin/sulbactam  Tmax: 98.6 WBC: 16.1 (decreased from 19.6)  Scr: 1.52 (down from 2.23), with continual improvement of renal function.   Blood cx 9/11: MRSA, 4/4 bottles  Repeat blood cx 9/13: NGTD x4 days  Wound Cx from 9/13 debridement: Moderate MRSA, few Streptococcus anginosis, few E. Coli, holding for possible anaerobe (gram stain GPC pairs, GNR) in 1/2 sets   Current vancomycin dose: 1g every 24 hours beginning 9/15  Vancomycin trough level 9/17 (collected at 1250 today): 20 (collected prior to 3rd dose that was started at 1545 today of Vancomycin 1g Q24H since dose change). Higher end of goal.    Plan: Given patient's renal function continues to improve, will not change current dose.   Continue vancomycin 1g every 24 hours   Monitor Scr daily   Height: 5' 10.98" (180.3 cm) Weight: 78.2 kg (172 lb 6.4 oz) IBW/kg (Calculated) : 75.26  Temp (24hrs), Avg:97.9 F (36.6 C), Min:97.2 F (36.2 C), Max:98.6 F (37 C)  Recent Labs  Lab 03/31/2020 1744 03/22/20 0106 03/22/20 0517 03/22/20 0517 03/22/20 2018 03/16/2020 0441 03/19/2020 0441 04/05/2020 1346 03/17/2020 1922 03/20/2020 2211 03/24/20 0244 03/18/2020 0420 03/23/2020 0819 03/26/20 0443 03/26/20 0444 03/27/20 0531 03/27/20 1250  WBC   < >  --  11.5*   < >  --  17.9*   < > 17.8* 14.8*  --   --   --  23.0*  --  19.6* 16.1*  --   CREATININE   < >  --  3.94*   < >  --  3.09*  --   --  2.42*  --   --  2.20*  --   --  2.23* 1.52*  --   LATICACIDVEN  --  3.2* 4.3*  --   --   --   --   --  2.4* 2.2*  --   --   --  1.7  --   --   --   VANCOTROUGH  --   --   --   --   --   --   --   --   --   --   --   --   --   --   --   --  20  VANCORANDOM  --   --   --   --  8  --    --   --   --   --  16  --   --   --   --   --   --    < > = values in this interval not displayed.    Estimated Creatinine Clearance: 51.6 mL/min (A) (by C-G formula based on SCr of 1.52 mg/dL (H)).    No Known Allergies  Antimicrobials this admission: Vancomycin 9/11 >>  Unasyn 9/13 >>  Zosyn 9/12>>9/12  Dose adjustments this admission: Unasyn 3g every 12 hours changed to Unasyn 3g every 6 hours due to improved renal function  Thank you for allowing pharmacy to be a part of this patient's care.  Rowland Lathe 03/27/2020 9:56 PM

## 2020-03-27 NOTE — Progress Notes (Signed)
ID Remains intubated On pressors--Vaso and neo BP (!) 146/84   Pulse (!) 58   Temp (!) 97.3 F (36.3 C)   Resp (!) 23   Ht 5' 10.98" (1.803 m)   Wt 78.2 kg   SpO2 100%   BMI 24.06 kg/m    Chest left drain Hs irrgeular And soft Rt femoral line Foley Sacral wound not examined   CBC Latest Ref Rng & Units 03/27/2020 03/26/2020 03/15/2020  WBC 4.0 - 10.5 K/uL 16.1(H) 19.6(H) 23.0(H)  Hemoglobin 13.0 - 17.0 g/dL 9.0(L) 8.7(L) 8.8(L)  Hematocrit 39 - 52 % 29.3(L) 28.4(L) 27.9(L)  Platelets 150 - 400 K/uL 56(L) 40(L) 51(L)    CMP Latest Ref Rng & Units 03/27/2020 03/26/2020 03/26/2020  Glucose 70 - 99 mg/dL 191(H) - 120(H)  BUN 8 - 23 mg/dL 54(H) - 75(H)  Creatinine 0.61 - 1.24 mg/dL 1.52(H) - 2.23(H)  Sodium 135 - 145 mmol/L 152(H) - 151(H)  Potassium 3.5 - 5.1 mmol/L 4.2 4.2 4.5  Chloride 98 - 111 mmol/L 118(H) - 120(H)  CO2 22 - 32 mmol/L 23 - 23  Calcium 8.9 - 10.3 mg/dL 7.5(L) - 7.6(L)  Total Protein 6.5 - 8.1 g/dL - - -  Total Bilirubin 0.3 - 1.2 mg/dL - - -  Alkaline Phos 38 - 126 U/L - - -  AST 15 - 41 U/L - - -  ALT 0 - 44 U/L - - -    Microbiology: MRSA in blood culture 03/27/2020 MRSA sacral wound culture Bone culture  MRSA, ecoli, strep anginosus     Assessment/Plan: ?MRSA bacteremiawith sepsis Bilateral cavitating infiltrates in the lung concerning for septic emboli with MRSA. Patient will needTEE once stable repeat bloodculturefrom 9/13 neg so far Continue vancomycin  Sacral decubitus -underwent debridement- stage IV. Culture sent- multiple organisms including MRSA- change unasyn to ceftriaxone + flagyl On pressors  Intubated post surgery  Tension pneumothorax left- s/p chest drain  AKI- due to infection- improving  Anemia   Parkinsons disease on carbidopa-levodopa  Discussed with care team ID will follow him peripherally this weekend. Call if needed

## 2020-03-27 NOTE — Progress Notes (Signed)
Pt remains sedated and intubated throughout shift. Responsive to pain, Chest tube still connected to -20 water suction. 150ml of output serosanguinous. Pt has remained SB/NSR. Wife updated and was suppose to have come by with grandson, but have not come as of yet. Will continue to monitor.

## 2020-03-27 NOTE — Progress Notes (Signed)
Pharmacy Antibiotic Note  [name] is a 64 y.o. male admitted on 03/11/2020 with pneumonia/MRSA bacteremia/sacral decubutis.  Pharmacy has been consulted for vancomycin/Unasyn dosing. ID is following.   Today, 03/27/20  Day #6 antibiotics: vancomycin and ampicillin/sulbactam  Tmax: 98.6 WBC: 16.1 (decreased from 19.6)  Scr: 1.52, Crl 51.6, UOP remains adequate  Blood cx 9/11: MRSA, 4/4 bottles  Repeat blood cx 9/13: NGTD x4 days  Wound Cx from 9/13 debridement: Moderate MRSA, few Streptococcus anginosis, few E. Coli, holding for possible anaerobe (gram stain GPC pairs, GNR) in 1/2 sets   Current vancomycin dose: 1g every 24 hours beginning 9/15  Vancomycin trough level 9/17: to be collected prior to vancomycin dose today   Plan: Continue vancomycin 1g every 24 hours pending vancomycin trough to be drawn today, prior to fourth dose.   Monitor Scr daily  Monitor vancomycin trough 30 minutes prior to   Height: 5' 10.98" (180.3 cm) Weight: 78.2 kg (172 lb 6.4 oz) IBW/kg (Calculated) : 75.26  Temp (24hrs), Avg:97.7 F (36.5 C), Min:97 F (36.1 C), Max:98.6 F (37 C)  Recent Labs  Lab 04/01/2020 1744 03/22/20 0106 03/22/20 0517 03/22/20 0517 03/22/20 2018 04/01/2020 0441 03/17/2020 0441 03/17/2020 1346 04/03/2020 1922 04/06/2020 2211 03/24/20 0244 03/23/2020 0420 03/21/2020 0819 03/26/20 0443 03/26/20 0444 03/27/20 0531  WBC   < >  --  11.5*   < >  --  17.9*   < > 17.8* 14.8*  --   --   --  23.0*  --  19.6* 16.1*  CREATININE   < >  --  3.94*   < >  --  3.09*  --   --  2.42*  --   --  2.20*  --   --  2.23* 1.52*  LATICACIDVEN  --  3.2* 4.3*  --   --   --   --   --  2.4* 2.2*  --   --   --  1.7  --   --   VANCORANDOM  --   --   --   --  8  --   --   --   --   --  16  --   --   --   --   --    < > = values in this interval not displayed.    Estimated Creatinine Clearance: 51.6 mL/min (A) (by C-G formula based on SCr of 1.52 mg/dL (H)).    No Known Allergies  Antimicrobials  this admission: Vancomycin 9/11 >>  Unasyn 9/13 >>  Zosyn 9/12>>9/12  Dose adjustments this admission: Unasyn 3g every 12 hours changed to Unasyn 3g every 6 hours due to improved renal function  Thank you for allowing pharmacy to be a part of this patients care.  [name] 03/27/2020 11:29 AM

## 2020-03-27 NOTE — Progress Notes (Signed)
CRITICAL CARE NOTE 9/12 admitted for septic shock MRSA bacteremia with sacral decub 9/13 post opresp failure, septic shock and sepsis 9/13 Liquified necrotic fat was encountered. A pocket of pus was cultured. excise tissue until all visibly necrotic areas were excised down to bleeding tissue. The sacral bone was exposed.  9/14 post op resp failure due to septic shock 9/15 severe septic shock 9/16 severe septic shock, LEFT PTX, chest tube placed 9/17 critically ill, multiorgan failure  CC  follow up respiratory failure  SUBJECTIVE Patient remains critically ill Prognosis is guarded Prognosis is grave and poor Very LOW chance of meaningful recovery Recommend comfort care status with progressive end stage Parkinson disease    BP (!) 142/76   Pulse 67   Temp 98.6 F (37 C) (Esophageal)   Resp 20   Ht 5' 10.98" (1.803 m)   Wt 78.2 kg   SpO2 95%   BMI 24.06 kg/m    I/O last 3 completed shifts: In: 4583.6 [I.V.:3799.8; IV Piggyback:783.8] Out: 4097 [Urine:2160; Chest Tube:1375] No intake/output data recorded.  SpO2: 95 % O2 Flow Rate (L/min): 3 L/min FiO2 (%): 50 %  Estimated body mass index is 24.06 kg/m as calculated from the following:   Height as of this encounter: 5' 10.98" (1.803 m).   Weight as of this encounter: 78.2 kg.  SIGNIFICANT EVENTS   REVIEW OF SYSTEMS  PATIENT IS UNABLE TO PROVIDE COMPLETE REVIEW OF SYSTEMS DUE TO SEVERE CRITICAL ILLNESS   Pressure Injury 01/08/18 Stage I -  Intact skin with non-blanchable redness of a localized area usually over a bony prominence. Bilateral unblanchable redness on buttocks (Active)  01/08/18 2351  Location: Buttocks  Location Orientation: Medial;Bilateral  Staging: Stage I -  Intact skin with non-blanchable redness of a localized area usually over a bony prominence.  Wound Description (Comments): Bilateral unblanchable redness on buttocks  Present on Admission: Yes     Pressure Injury 04/01/2020 Sacrum  Right;Left;Posterior Unstageable - Full thickness tissue loss in which the base of the injury is covered by slough (yellow, tan, gray, green or brown) and/or eschar (tan, brown or black) in the wound bed. Pt with necrosis  (Active)  03/16/2020 1744  Location: Sacrum  Location Orientation: Right;Left;Posterior  Staging: Unstageable - Full thickness tissue loss in which the base of the injury is covered by slough (yellow, tan, gray, green or brown) and/or eschar (tan, brown or black) in the wound bed.  Wound Description (Comments): Pt with necrosis on sacral wound   Present on Admission:       PHYSICAL EXAMINATION:  GENERAL:critically ill appearing, +resp distress PULMONARY: +rhonchi, +wheezing CARDIOVASCULAR: S1 and S2. Regular rate and rhythm. No murmurs, rubs, or gallops.  GASTROINTESTINAL: Soft, nontender, -distended.  Positive bowel sounds.   MUSCULOSKELETAL: No swelling, clubbing, or edema.  NEUROLOGIC: obtunded, GCS<8 SKIN:sacral wound  MEDICATIONS: I have reviewed all medications and confirmed regimen as documented   CULTURE RESULTS   Recent Results (from the past 240 hour(s))  Blood Culture (routine x 2)     Status: Abnormal   Collection Time: 03/18/2020  5:43 PM   Specimen: BLOOD  Result Value Ref Range Status   Specimen Description   Final    BLOOD LEFT FORE ARM Performed at [facility], [address] [place], [place] [address]    Special Requests   Final    BOTTLES DRAWN AEROBIC AND ANAEROBIC Blood Culture adequate volume Performed at [facility], [address] [place], [place] [address]  Culture  Setup Time   Final    GRAM POSITIVE COCCI IN BOTH AEROBIC AND ANAEROBIC BOTTLES CRITICAL VALUE NOTED.  VALUE IS CONSISTENT WITH PREVIOUSLY REPORTED AND CALLED VALUE. Performed at [facility], [address] [place], [place] [address]    Culture (A)  Final    STAPHYLOCOCCUS AUREUS SUSCEPTIBILITIES PERFORMED ON PREVIOUS CULTURE  WITHIN THE LAST 5 DAYS. Performed at [facility], [place] [address] [place], [place] [address]    Report Status 03/24/2020 FINAL  Final  SARS Coronavirus 2 by RT PCR (hospital order, performed in [facility] hospital lab) Nasopharyngeal Nasopharyngeal Swab     Status: None   Collection Time: 03/30/2020  5:43 PM   Specimen: Nasopharyngeal Swab  Result Value Ref Range Status   SARS Coronavirus 2 NEGATIVE NEGATIVE Final    Comment: (NOTE) SARS-CoV-2 target nucleic acids are NOT DETECTED.  The SARS-CoV-2 RNA is generally detectable in upper and lower respiratory specimens during the acute phase of infection. The lowest concentration of SARS-CoV-2 viral copies this assay can detect is 250 copies / mL. A negative result does not preclude SARS-CoV-2 infection and should not be used as the sole basis for treatment or other patient management decisions.  A negative result may occur with improper specimen collection / handling, submission of specimen other than nasopharyngeal swab, presence of viral mutation(s) within the areas targeted by this assay, and inadequate number of viral copies (<250 copies / mL). A negative result must be combined with clinical observations, patient history, and epidemiological information.  Fact Sheet for Patients:   StrictlyIdeas.no  Fact Sheet for Healthcare Providers: BankingDealers.co.za  This test is not yet approved or  cleared by the [place] FDA and has been authorized for detection and/or diagnosis of SARS-CoV-2 by FDA under an Emergency Use Authorization (EUA).  This EUA will remain in effect (meaning this test can be used) for the duration of the COVID-19 declaration under Section 564(b)(1) of the Act, 21 U.S.C. section 360bbb-3(b)(1), unless the authorization is terminated or revoked sooner.  Performed at [facility], [address], [place], [place] [address]   Blood Culture  (routine x 2)     Status: Abnormal   Collection Time: 04/07/2020  5:44 PM   Specimen: BLOOD  Result Value Ref Range Status   Specimen Description   Final    BLOOD RIGHT FORE ARM Performed at [facility], [address], [place], [place] [address]    Special Requests   Final    BOTTLES DRAWN AEROBIC AND ANAEROBIC Blood Culture adequate volume Performed at [facility], [address] [place], [place] [address]    Culture  Setup Time   Final    Organism ID to follow GRAM POSITIVE COCCI IN BOTH AEROBIC AND ANAEROBIC BOTTLES CRITICAL RESULT CALLED TO, READ BACK BY AND VERIFIED WITH: DAVID BESANTI AT 0607 03/22/20 [place] Performed at [facility], [address] [place], [place] [address]    Culture METHICILLIN RESISTANT STAPHYLOCOCCUS AUREUS (A)  Final   Report Status 03/24/2020 FINAL  Final   Organism ID, Bacteria METHICILLIN RESISTANT STAPHYLOCOCCUS AUREUS  Final      Susceptibility   Methicillin resistant staphylococcus aureus - MIC*    CIPROFLOXACIN >=8 RESISTANT Resistant     ERYTHROMYCIN >=8 RESISTANT Resistant     GENTAMICIN <=0.5 SENSITIVE Sensitive     OXACILLIN >=4 RESISTANT Resistant     TETRACYCLINE <=1 SENSITIVE Sensitive     VANCOMYCIN 1 SENSITIVE Sensitive  TRIMETH/SULFA <=10 SENSITIVE Sensitive     CLINDAMYCIN <=0.25 SENSITIVE Sensitive     RIFAMPIN <=0.5 SENSITIVE Sensitive     Inducible Clindamycin NEGATIVE Sensitive     * METHICILLIN RESISTANT STAPHYLOCOCCUS AUREUS  Blood Culture ID Panel (Reflexed)     Status: Abnormal   Collection Time: 03/30/2020  5:44 PM  Result Value Ref Range Status   Enterococcus faecalis NOT DETECTED NOT DETECTED Final   Enterococcus Faecium NOT DETECTED NOT DETECTED Final   Listeria monocytogenes NOT DETECTED NOT DETECTED Final   Staphylococcus species DETECTED (A) NOT DETECTED Final    Comment: CRITICAL RESULT CALLED TO, READ BACK BY AND VERIFIED WITH:  DAVID BESANTI AT 0607 03/22/20 SDR     Staphylococcus aureus (BCID) DETECTED (A) NOT DETECTED Final    Comment: Methicillin (oxacillin)-resistant Staphylococcus aureus (MRSA). MRSA is predictably resistant to beta-lactam antibiotics (except ceftaroline). Preferred therapy is vancomycin unless clinically contraindicated. Patient requires contact precautions if  hospitalized. CRITICAL RESULT CALLED TO, READ BACK BY AND VERIFIED WITH:  DAVID BESANTI AT 0607 03/22/20 SDR    Staphylococcus epidermidis NOT DETECTED NOT DETECTED Final   Staphylococcus lugdunensis NOT DETECTED NOT DETECTED Final   Streptococcus species NOT DETECTED NOT DETECTED Final   Streptococcus agalactiae NOT DETECTED NOT DETECTED Final   Streptococcus pneumoniae NOT DETECTED NOT DETECTED Final   Streptococcus pyogenes NOT DETECTED NOT DETECTED Final   A.calcoaceticus-baumannii NOT DETECTED NOT DETECTED Final   Bacteroides fragilis NOT DETECTED NOT DETECTED Final   Enterobacterales NOT DETECTED NOT DETECTED Final   Enterobacter cloacae complex NOT DETECTED NOT DETECTED Final   Escherichia coli NOT DETECTED NOT DETECTED Final   Klebsiella aerogenes NOT DETECTED NOT DETECTED Final   Klebsiella oxytoca NOT DETECTED NOT DETECTED Final   Klebsiella pneumoniae NOT DETECTED NOT DETECTED Final   Proteus species NOT DETECTED NOT DETECTED Final   Salmonella species NOT DETECTED NOT DETECTED Final   Serratia marcescens NOT DETECTED NOT DETECTED Final   Haemophilus influenzae NOT DETECTED NOT DETECTED Final   Neisseria meningitidis NOT DETECTED NOT DETECTED Final   Pseudomonas aeruginosa NOT DETECTED NOT DETECTED Final   Stenotrophomonas maltophilia NOT DETECTED NOT DETECTED Final   Candida albicans NOT DETECTED NOT DETECTED Final   Candida auris NOT DETECTED NOT DETECTED Final   Candida glabrata NOT DETECTED NOT DETECTED Final   Candida krusei NOT DETECTED NOT DETECTED Final   Candida parapsilosis NOT DETECTED NOT DETECTED Final   Candida tropicalis NOT DETECTED NOT  DETECTED Final   Cryptococcus neoformans/gattii NOT DETECTED NOT DETECTED Final   Meth resistant mecA/C and MREJ DETECTED (A) NOT DETECTED Final    Comment: CRITICAL RESULT CALLED TO, READ BACK BY AND VERIFIED WITH:  DAVID BESANTI AT 0607 03/22/20 SDR Performed at [facility], [address] [place], [place] [address]   Urine culture     Status: Abnormal   Collection Time: 04/02/2020  6:52 PM   Specimen: Urine, Random  Result Value Ref Range Status   Specimen Description   Final    URINE, RANDOM Performed at [facility], [address] [place], [place] [address]    Special Requests   Final    NONE Performed at [facility], [address] [place], [place] [address]    Culture MULTIPLE SPECIES PRESENT, SUGGEST RECOLLECTION (A)  Final   Report Status 04/05/2020 FINAL  Final  Blastomyces Antigen     Status: None   Collection Time: 03/22/20  3:17 AM   Specimen: Urine  Result Value  Ref Range Status   Blastomyces Antigen None Detected None Detected ng/mL Final    Comment: (NOTE) Results reported as ng/mL in 0.2 - 14.7 ng/mL range Results above the limit of detection but below 0.2 ng/mL are reported as 'Positive, Below the Limit of Quantification' Results above 14.7 ng/mL are reported as 'Positive, Above the Limit of Quantification'    Specimen Type SERUM  Final    Comment: (NOTE) Performed At: [facility] [address] [place], [place] [id number] Noralyn Pick MD SW:[id number]   MRSA PCR Screening     Status: Abnormal   Collection Time: 03/22/20  8:30 AM   Specimen: Nasopharyngeal  Result Value Ref Range Status   MRSA by PCR POSITIVE (A) NEGATIVE Final    Comment:        The GeneXpert MRSA Assay (FDA approved for NASAL specimens only), is one component of a comprehensive MRSA colonization surveillance program. It is not intended to diagnose MRSA infection nor to guide or monitor treatment for MRSA infections. RESULT  CALLED TO, READ BACK BY AND VERIFIED WITH:  [facility] WHITE AT 1007 03/22/20 SDR Performed at [facility], [address] [place], [place] [address]   Aerobic/Anaerobic Culture (surgical/deep wound)     Status: None (Preliminary result)   Collection Time: 03/30/2020  6:14 PM   Specimen: PATH Other; Wound  Result Value Ref Range Status   Specimen Description   Final    WOUND Performed at [facility], [address], [place], [place] [address]    Special Requests   Final    DEEP TISSUE SACRAL CULTURE Performed at [facility], [address] [place], [place] [address]    Gram Stain   Final    NO WBC SEEN ABUNDANT GRAM POSITIVE COCCI IN PAIRS FEW GRAM NEGATIVE RODS    Culture   Final    MODERATE STAPHYLOCOCCUS AUREUS SUSCEPTIBILITIES TO FOLLOW HOLDING FOR POSSIBLE ANAEROBE Performed at [facility], [place] [address] [place], [place] [address]    Report Status PENDING  Incomplete  Aerobic/Anaerobic Culture (surgical/deep wound)     Status: None (Preliminary result)   Collection Time: 03/22/2020  6:14 PM   Specimen: PATH Other; Wound  Result Value Ref Range Status   Specimen Description   Final    WOUND Performed at [facility], [address] [place], [place] [address]    Special Requests   Final    DEEP SACRAL BONE CULTURE Performed at [facility], [place], [place] [address]    Gram Stain NO WBC SEEN NO ORGANISMS SEEN   Final   Culture   Final    RARE STAPHYLOCOCCUS AUREUS HOLDING FOR POSSIBLE ANAEROBE Performed at [facility], [address], [place], [place] [address]    Report Status PENDING  Incomplete  Culture, blood (Routine X 2) w Reflex to ID Panel     Status: None (Preliminary result)   Collection Time: 03/24/2020 10:12 PM   Specimen: BLOOD  Result Value Ref Range Status   Specimen Description BLOOD RIGHT HAND  Final   Special Requests   Final    BOTTLES DRAWN AEROBIC AND ANAEROBIC  Blood Culture adequate volume   Culture   Final    NO GROWTH 4 DAYS Performed at [facility], [address] [place], [place] [address]    Report Status PENDING  Incomplete  Culture, blood (Routine X 2) w Reflex to ID Panel  Status: None (Preliminary result)   Collection Time: 04/03/2020 10:13 PM   Specimen: BLOOD  Result Value Ref Range Status   Specimen Description BLOOD LEFT HAND  Final   Special Requests   Final    BOTTLES DRAWN AEROBIC AND ANAEROBIC Blood Culture adequate volume   Culture   Final    NO GROWTH 4 DAYS Performed at [facility], [address], [place], [place] [address]    Report Status PENDING  Incomplete          IMAGING    No results found.   Nutrition Status: Nutrition Problem: Severe Malnutrition Etiology: chronic illness (parkinson's) Signs/Symptoms: moderate fat depletion, severe fat depletion, moderate muscle depletion, severe muscle depletion Interventions: Ensure Enlive (each supplement provides 350kcal and 20 grams of protein), MVI, Magic cup     Indwelling Urinary Catheter continued, requirement due to   Reason to continue Indwelling Urinary Catheter strict Intake/Output monitoring for hemodynamic instability   Central Line/ continued, requirement due to  Reason to continue [place] of central venous pressure or other hemodynamic parameters and poor IV access   Ventilator continued, requirement due to severe respiratory failure   Ventilator Sedation RASS 0 to -2      ASSESSMENT AND PLAN SYNOPSIS SEVERE SEPSIS MRSA BACTEREMIA WITH END STAGE PARKINSONS DISEASE GANGRENOUS SACRAL WOUND/OSTEO with POST OP RESP FAILURE AND SEPTIC SHOCK-PROGNOSIS IS VERY POOR, VERY POOR CHANCE OF MEANINGFUL RECOVERY, COMPLICATED BY ACUTE LEFT PTX FROM UNDERLYING MRSA CAVITARY LUNG LESIONS  Severe ACUTE Hypoxic and Hypercapnic Respiratory Failure -continue Full MV support -continue Bronchodilator Therapy -Wean Fio2  and PEEP as tolerated -VAP/VENT bundle implementation  ACUTE KIDNEY INJURY/Renal Failure -continue Foley Catheter-assess need -Avoid nephrotoxic agents -Follow urine output, BMP -Ensure adequate renal perfusion, optimize oxygenation -Renal dose medications    NEUROLOGY Acute toxic metabolic encephalopathy, need for sedation Goal RASS -2 to -3  SHOCK-SEPSIS present upon admission -use vasopressors to keep MAP>65  CARDIAC ICU monitoring  ID -continue IV abx as prescibed -follow up cultures  GI GI PROPHYLAXIS as indicated  NUTRITIONAL STATUS Nutrition Status: Nutrition Problem: Severe Malnutrition Etiology: chronic illness (parkinson's) Signs/Symptoms: moderate fat depletion, severe fat depletion, moderate muscle depletion, severe muscle depletion Interventions: Ensure Enlive (each supplement provides 350kcal and 20 grams of protein), MVI, Magic cup   DIET-->NPO Constipation protocol as indicated  ENDO - will use ICU hypoglycemic\Hyperglycemia protocol if indicated     ELECTROLYTES -follow labs as needed -replace as needed -pharmacy consultation and following   DVT/GI PRX ordered and assessed TRANSFUSIONS AS NEEDED MONITOR FSBS I Assessed the need for Labs I Assessed the need for Foley I Assessed the need for Central Venous Line Family Discussion when available I Assessed the need for Mobilization I made an Assessment of medications to be adjusted accordingly Safety Risk assessment completed   CASE DISCUSSED IN MULTIDISCIPLINARY ROUNDS WITH ICU TEAM  Critical Care Time devoted to patient care services described in this note is 35 minutes.   Overall, patient is critically ill, prognosis is guarded.  Patient with Multiorgan failure and at high risk for cardiac arrest and death.    Corrin Parker, M.D.  [facility]  Medical Director [place] Director [facility] Cardio-Pulmonary Department

## 2020-03-28 ENCOUNTER — Inpatient Hospital Stay: Payer: Medicare Other

## 2020-03-28 LAB — CBC WITH DIFFERENTIAL/PLATELET
Abs Immature Granulocytes: 0.27 10*3/uL — ABNORMAL HIGH (ref 0.00–0.07)
Basophils Absolute: 0.1 10*3/uL (ref 0.0–0.1)
Basophils Relative: 0 %
Eosinophils Absolute: 0 10*3/uL (ref 0.0–0.5)
Eosinophils Relative: 0 %
HCT: 26.8 % — ABNORMAL LOW (ref 39.0–52.0)
Hemoglobin: 8.6 g/dL — ABNORMAL LOW (ref 13.0–17.0)
Immature Granulocytes: 2 %
Lymphocytes Relative: 4 %
Lymphs Abs: 0.6 10*3/uL — ABNORMAL LOW (ref 0.7–4.0)
MCH: 29.8 pg (ref 26.0–34.0)
MCHC: 32.1 g/dL (ref 30.0–36.0)
MCV: 92.7 fL (ref 80.0–100.0)
Monocytes Absolute: 0.4 10*3/uL (ref 0.1–1.0)
Monocytes Relative: 3 %
Neutro Abs: 13.4 10*3/uL — ABNORMAL HIGH (ref 1.7–7.7)
Neutrophils Relative %: 91 %
Platelets: 50 10*3/uL — ABNORMAL LOW (ref 150–400)
RBC: 2.89 MIL/uL — ABNORMAL LOW (ref 4.22–5.81)
RDW: 18.3 % — ABNORMAL HIGH (ref 11.5–15.5)
WBC: 14.8 10*3/uL — ABNORMAL HIGH (ref 4.0–10.5)
nRBC: 0.2 % (ref 0.0–0.2)

## 2020-03-28 LAB — RENAL FUNCTION PANEL
Albumin: 1.3 g/dL — ABNORMAL LOW (ref 3.5–5.0)
Anion gap: 7 (ref 5–15)
BUN: 54 mg/dL — ABNORMAL HIGH (ref 8–23)
CO2: 27 mmol/L (ref 22–32)
Calcium: 7.6 mg/dL — ABNORMAL LOW (ref 8.9–10.3)
Chloride: 118 mmol/L — ABNORMAL HIGH (ref 98–111)
Creatinine, Ser: 1.41 mg/dL — ABNORMAL HIGH (ref 0.61–1.24)
GFR calc Af Amer: >60 mL/min (ref >60)
GFR calc non Af Amer: 52 mL/min — ABNORMAL LOW (ref >60)
Glucose, Bld: 114 mg/dL — ABNORMAL HIGH (ref 70–99)
Phosphorus: 3 mg/dL (ref 2.5–4.6)
Potassium: 4 mmol/L (ref 3.5–5.1)
Sodium: 152 mmol/L — ABNORMAL HIGH (ref 135–145)

## 2020-03-28 LAB — MAGNESIUM: Magnesium: 2.4 mg/dL (ref 1.7–2.4)

## 2020-03-28 LAB — AEROBIC/ANAEROBIC CULTURE W GRAM STAIN (SURGICAL/DEEP WOUND)
Gram Stain: NONE SEEN
Gram Stain: NONE SEEN

## 2020-03-28 LAB — GLUCOSE, CAPILLARY
Glucose-Capillary: 101 mg/dL — ABNORMAL HIGH (ref 70–99)
Glucose-Capillary: 110 mg/dL — ABNORMAL HIGH (ref 70–99)
Glucose-Capillary: 131 mg/dL — ABNORMAL HIGH (ref 70–99)
Glucose-Capillary: 113 mg/dL — ABNORMAL HIGH (ref 70–99)
Glucose-Capillary: 123 mg/dL — ABNORMAL HIGH (ref 70–99)

## 2020-03-28 LAB — CULTURE, BLOOD (ROUTINE X 2)
Culture: NO GROWTH
Culture: NO GROWTH
Special Requests: ADEQUATE
Special Requests: ADEQUATE

## 2020-03-28 MED ORDER — SODIUM CHLORIDE 0.9 % IV SOLN
2.0000 g | INTRAVENOUS | Status: DC
  Administered 2020-03-28: 2 g via INTRAVENOUS
  Filled 2020-03-28: qty 2

## 2020-03-28 MED ORDER — BIOTENE DRY MOUTH MT LIQD: 15.0000 mL | OROMUCOSAL | Status: DC | PRN

## 2020-03-28 MED ORDER — ONDANSETRON HCL 4 MG/2ML IJ SOLN: 4.0000 mg | Freq: Four times a day (QID) | INTRAMUSCULAR | Status: DC | PRN

## 2020-03-28 MED ORDER — HALOPERIDOL LACTATE 2 MG/ML PO CONC
0.5000 mg | ORAL | Status: DC | PRN
  Filled 2020-03-28: qty 0.3

## 2020-03-28 MED ORDER — MORPHINE SULFATE (PF) 2 MG/ML IV SOLN
1.0000 mg | INTRAVENOUS | Status: DC | PRN
  Administered 2020-03-28: 2 mg via INTRAVENOUS
  Filled 2020-03-28 (×2): qty 1

## 2020-03-28 MED ORDER — GLYCOPYRROLATE 0.2 MG/ML IJ SOLN: 0.2000 mg | INTRAMUSCULAR | Status: DC | PRN

## 2020-03-28 MED ORDER — GLYCOPYRROLATE 1 MG PO TABS
1.0000 mg | ORAL_TABLET | ORAL | Status: DC | PRN
  Filled 2020-03-28: qty 1

## 2020-03-28 MED ORDER — CHLORHEXIDINE GLUCONATE CLOTH 2 % EX PADS: 6.0000 | MEDICATED_PAD | Freq: Every day | CUTANEOUS | Status: DC

## 2020-03-28 MED ORDER — METRONIDAZOLE IN NACL 5-0.79 MG/ML-% IV SOLN
500.0000 mg | Freq: Three times a day (TID) | INTRAVENOUS | Status: DC
  Administered 2020-03-28: 500 mg via INTRAVENOUS
  Filled 2020-03-28 (×3): qty 100

## 2020-03-28 MED ORDER — BISACODYL 10 MG RE SUPP: 10.0000 mg | Freq: Every day | RECTAL | Status: DC | PRN

## 2020-03-28 MED ORDER — POLYVINYL ALCOHOL 1.4 % OP SOLN
1.0000 [drp] | Freq: Four times a day (QID) | OPHTHALMIC | Status: DC | PRN
  Filled 2020-03-28: qty 15

## 2020-03-28 MED ORDER — HALOPERIDOL 0.5 MG PO TABS
0.5000 mg | ORAL_TABLET | ORAL | Status: DC | PRN
  Filled 2020-03-28: qty 1

## 2020-03-28 MED ORDER — ONDANSETRON 4 MG PO TBDP
4.0000 mg | ORAL_TABLET | Freq: Four times a day (QID) | ORAL | Status: DC | PRN
  Filled 2020-03-28: qty 1

## 2020-03-28 MED ORDER — HALOPERIDOL LACTATE 5 MG/ML IJ SOLN: 0.5000 mg | INTRAMUSCULAR | Status: DC | PRN

## 2020-04-10 NOTE — Progress Notes (Signed)
Family has arrived at bedside, they wish to continue with comfort care measures. NP Tukov notified, waiting on orders to proceed with one way extubation.

## 2020-04-10 NOTE — Progress Notes (Signed)
  GOALS OF CARE DISCUSSION  The Clinical status was relayed to [name] in detail.  Updated and notified of patients medical condition.  Patient remains unresponsive and will not open eyes to command.    Patient increased WOB and using accessory muscles to breathe Explained to family course of therapy and the modalities    Patient with Progressive multiorgan failure with very low chance of meaningful recovery despite all aggressive and optimal medical therapy. Patient is in the Dying  Process associated with Suffering.     PATIENT WITH ACUTE RT PNEUMOTHORAX I HAVE EXPLAINED TO WIFE [name] THAT A CHEST TUBE AT THIS TIME IN THE SETTING OF COMFORT MEASURES WILL NOT CHANGE HIS DEMISE HOWEVER, WILL PROVIDE MORE SUFFERING. THE WIFE STATED NOT TO PLACED ANY MORE TUBES IN HIS BODY.  THE WIFE IS WAITING FOR A STEP DAUGHTER FROM [place] TO ARRIVE FOR COMFORT CARE MEASURES.   Family understands the situation.  They have consented and agreed to DNR/DNI and would like to proceed with Comfort care measures when all family arrives to bedside  Family are satisfied with Plan of action and management. All questions answered  Additional CC time 32 mins   [name], M.D.  [name]  Medical Director [place] Director [facility] Cardio-Pulmonary Department

## 2020-04-10 NOTE — Progress Notes (Signed)
Patient extubated to 2L [place] at Oct 12, 2053, patient time of death October 12, 2056 pronounced by Latrelle Dodrill, RN and this RN, wife at bedside.

## 2020-04-10 NOTE — Progress Notes (Signed)
Orders for extubation. Extubated patient to 2 lpm nasal cannula. Family member at bedside.

## 2020-04-10 NOTE — Progress Notes (Signed)
CRITICAL CARE NOTE 9/12 admitted for septic shock MRSA bacteremia with sacral decub 9/13 post opresp failure, septic shock and sepsis 9/13 Liquified necrotic fat was encountered. A pocket of pus was cultured. excise tissue until all visibly necrotic areas were excised down to bleeding tissue. The sacral bone was exposed.  9/14 post op resp failure due to septic shock 9/15 severe septic shock 9/16 severe septic shock, LEFT PTX, chest tube placed 9/17 critically ill, multiorgan failure  CC  Follow up resp failure   HPI Remains critically ill Poor prognosis Prognosis is grave and poor Very LOW chance of meaningful recovery Recommend comfort care status with progressive end stage Parkinson disease    BP (!) 80/51 (BP Location: Left Arm)   Pulse (!) 58   Temp (!) 97.5 F (36.4 C) (Esophageal)   Resp 19   Ht 5' 10.98" (1.803 m)   Wt 79.4 kg   SpO2 91%   BMI 24.42 kg/m    I/O last 3 completed shifts: In: 4989.8 [I.V.:4289.8; IV Piggyback:700] Out: 2960 [Urine:1870; Emesis/NG output:250; Chest Tube:840] No intake/output data recorded.  SpO2: 91 % O2 Flow Rate (L/min): 3 L/min FiO2 (%): 50 %  Estimated body mass index is 24.42 kg/m as calculated from the following:   Height as of this encounter: 5' 10.98" (1.803 m).   Weight as of this encounter: 79.4 kg.  REVIEW OF SYSTEMS  PATIENT IS UNABLE TO PROVIDE COMPLETE REVIEW OF SYSTEM S DUE TO SEVERE CRITICAL ILLNESS AND ENCEPHALOPATHY   Pressure Injury 01/08/18 Stage I -  Intact skin with non-blanchable redness of a localized area usually over a bony prominence. Bilateral unblanchable redness on buttocks (Active)  01/08/18 2351  Location: Buttocks  Location Orientation: Medial;Bilateral  Staging: Stage I -  Intact skin with non-blanchable redness of a localized area usually over a bony prominence.  Wound Description (Comments): Bilateral unblanchable redness on buttocks  Present on Admission: Yes     Pressure  Injury 04/03/2020 Sacrum Right;Left;Posterior Unstageable - Full thickness tissue loss in which the base of the injury is covered by slough (yellow, tan, gray, green or brown) and/or eschar (tan, brown or black) in the wound bed. Pt with necrosis  (Active)  03/30/2020 1744  Location: Sacrum  Location Orientation: Right;Left;Posterior  Staging: Unstageable - Full thickness tissue loss in which the base of the injury is covered by slough (yellow, tan, gray, green or brown) and/or eschar (tan, brown or black) in the wound bed.  Wound Description (Comments): Pt with necrosis on sacral wound   Present on Admission:      PHYSICAL EXAMINATION:  GENERAL:critically ill appearing, +resp distress PULMONARY: +rhonchi, +wheezing CARDIOVASCULAR: S1 and S2. Regular rate and rhythm. No murmurs, rubs, or gallops.  GASTROINTESTINAL: Soft, nontender, -distended. Positive bowel sounds.  MUSCULOSKELETAL: No swelling, clubbing, or edema.  NEUROLOGIC: obtunded SKIN:intact,warm,dry    MEDICATIONS: I have reviewed all medications and confirmed regimen as documented   CULTURE RESULTS   Recent Results (from the past 240 hour(s))  Blood Culture (routine x 2)     Status: Abnormal   Collection Time: 03/22/2020  5:43 PM   Specimen: BLOOD  Result Value Ref Range Status   Specimen Description   Final    BLOOD LEFT FORE ARM Performed at [facility], [address] [place], [place] [address]    Special Requests   Final    BOTTLES DRAWN AEROBIC AND ANAEROBIC Blood Culture adequate volume Performed at [facility], [address] [place], [place]  [address]    Culture  Setup Time   Final    GRAM POSITIVE COCCI IN BOTH AEROBIC AND ANAEROBIC BOTTLES CRITICAL VALUE NOTED.  VALUE IS CONSISTENT WITH PREVIOUSLY REPORTED AND CALLED VALUE. Performed at [facility], [address] [place], [place] [address]    Culture (A)  Final    STAPHYLOCOCCUS AUREUS SUSCEPTIBILITIES PERFORMED ON  PREVIOUS CULTURE WITHIN THE LAST 5 DAYS. Performed at [facility], [place] [address] [place], [place] [address]    Report Status 03/24/2020 FINAL  Final  SARS Coronavirus 2 by RT PCR (hospital order, performed in [facility] hospital lab) Nasopharyngeal Nasopharyngeal Swab     Status: None   Collection Time: 03/15/2020  5:43 PM   Specimen: Nasopharyngeal Swab  Result Value Ref Range Status   SARS Coronavirus 2 NEGATIVE NEGATIVE Final    Comment: (NOTE) SARS-CoV-2 target nucleic acids are NOT DETECTED.  The SARS-CoV-2 RNA is generally detectable in upper and lower respiratory specimens during the acute phase of infection. The lowest concentration of SARS-CoV-2 viral copies this assay can detect is 250 copies / mL. A negative result does not preclude SARS-CoV-2 infection and should not be used as the sole basis for treatment or other patient management decisions.  A negative result may occur with improper specimen collection / handling, submission of specimen other than nasopharyngeal swab, presence of viral mutation(s) within the areas targeted by this assay, and inadequate number of viral copies (<250 copies / mL). A negative result must be combined with clinical observations, patient history, and epidemiological information.  Fact Sheet for Patients:   StrictlyIdeas.no  Fact Sheet for Healthcare Providers: BankingDealers.co.za  This test is not yet approved or  cleared by the [place] FDA and has been authorized for detection and/or diagnosis of SARS-CoV-2 by FDA under an Emergency Use Authorization (EUA).  This EUA will remain in effect (meaning this test can be used) for the duration of the COVID-19 declaration under Section 564(b)(1) of the Act, 21 U.S.C. section 360bbb-3(b)(1), unless the authorization is terminated or revoked sooner.  Performed at [facility], [address], [place], [place] [address]    Blood Culture (routine x 2)     Status: Abnormal   Collection Time: 04/06/2020  5:44 PM   Specimen: BLOOD  Result Value Ref Range Status   Specimen Description   Final    BLOOD RIGHT FORE ARM Performed at [facility], [address], [place], [place] [address]    Special Requests   Final    BOTTLES DRAWN AEROBIC AND ANAEROBIC Blood Culture adequate volume Performed at [facility], [address] [place], [place] [address]    Culture  Setup Time   Final    Organism ID to follow GRAM POSITIVE COCCI IN BOTH AEROBIC AND ANAEROBIC BOTTLES CRITICAL RESULT CALLED TO, READ BACK BY AND VERIFIED WITH: DAVID BESANTI AT 0607 03/22/20 Dol[place] Performed at [facility], [address] [place], [place] [address]    Culture METHICILLIN RESISTANT STAPHYLOCOCCUS AUREUS (A)  Final   Report Status 03/24/2020 FINAL  Final   Organism ID, Bacteria METHICILLIN RESISTANT STAPHYLOCOCCUS AUREUS  Final      Susceptibility   Methicillin resistant staphylococcus aureus - MIC*    CIPROFLOXACIN >=8 RESISTANT Resistant     ERYTHROMYCIN >=8 RESISTANT Resistant     GENTAMICIN <=0.5 SENSITIVE Sensitive     OXACILLIN >=4 RESISTANT Resistant     TETRACYCLINE <=1 SENSITIVE Sensitive     VANCOMYCIN  1 SENSITIVE Sensitive     TRIMETH/SULFA <=10 SENSITIVE Sensitive     CLINDAMYCIN <=0.25 SENSITIVE Sensitive     RIFAMPIN <=0.5 SENSITIVE Sensitive     Inducible Clindamycin NEGATIVE Sensitive     * METHICILLIN RESISTANT STAPHYLOCOCCUS AUREUS  Blood Culture ID Panel (Reflexed)     Status: Abnormal   Collection Time: 03/20/2020  5:44 PM  Result Value Ref Range Status   Enterococcus faecalis NOT DETECTED NOT DETECTED Final   Enterococcus Faecium NOT DETECTED NOT DETECTED Final   Listeria monocytogenes NOT DETECTED NOT DETECTED Final   Staphylococcus species DETECTED (A) NOT DETECTED Final    Comment: CRITICAL RESULT CALLED TO, READ BACK BY AND VERIFIED WITH:  DAVID BESANTI AT 0607 03/22/20 SDR     Staphylococcus aureus (BCID) DETECTED (A) NOT DETECTED Final    Comment: Methicillin (oxacillin)-resistant Staphylococcus aureus (MRSA). MRSA is predictably resistant to beta-lactam antibiotics (except ceftaroline). Preferred therapy is vancomycin unless clinically contraindicated. Patient requires contact precautions if  hospitalized. CRITICAL RESULT CALLED TO, READ BACK BY AND VERIFIED WITH:  DAVID BESANTI AT 0607 03/22/20 SDR    Staphylococcus epidermidis NOT DETECTED NOT DETECTED Final   Staphylococcus lugdunensis NOT DETECTED NOT DETECTED Final   Streptococcus species NOT DETECTED NOT DETECTED Final   Streptococcus agalactiae NOT DETECTED NOT DETECTED Final   Streptococcus pneumoniae NOT DETECTED NOT DETECTED Final   Streptococcus pyogenes NOT DETECTED NOT DETECTED Final   A.calcoaceticus-baumannii NOT DETECTED NOT DETECTED Final   Bacteroides fragilis NOT DETECTED NOT DETECTED Final   Enterobacterales NOT DETECTED NOT DETECTED Final   Enterobacter cloacae complex NOT DETECTED NOT DETECTED Final   Escherichia coli NOT DETECTED NOT DETECTED Final   Klebsiella aerogenes NOT DETECTED NOT DETECTED Final   Klebsiella oxytoca NOT DETECTED NOT DETECTED Final   Klebsiella pneumoniae NOT DETECTED NOT DETECTED Final   Proteus species NOT DETECTED NOT DETECTED Final   Salmonella species NOT DETECTED NOT DETECTED Final   Serratia marcescens NOT DETECTED NOT DETECTED Final   Haemophilus influenzae NOT DETECTED NOT DETECTED Final   Neisseria meningitidis NOT DETECTED NOT DETECTED Final   Pseudomonas aeruginosa NOT DETECTED NOT DETECTED Final   Stenotrophomonas maltophilia NOT DETECTED NOT DETECTED Final   Candida albicans NOT DETECTED NOT DETECTED Final   Candida auris NOT DETECTED NOT DETECTED Final   Candida glabrata NOT DETECTED NOT DETECTED Final   Candida krusei NOT DETECTED NOT DETECTED Final   Candida parapsilosis NOT DETECTED NOT DETECTED Final   Candida tropicalis NOT DETECTED NOT  DETECTED Final   Cryptococcus neoformans/gattii NOT DETECTED NOT DETECTED Final   Meth resistant mecA/C and MREJ DETECTED (A) NOT DETECTED Final    Comment: CRITICAL RESULT CALLED TO, READ BACK BY AND VERIFIED WITH:  DAVID BESANTI AT 0607 03/22/20 SDR Performed at [facility], [address], [place], [place] [address]   Urine culture     Status: Abnormal   Collection Time: 04/02/2020  6:52 PM   Specimen: Urine, Random  Result Value Ref Range Status   Specimen Description   Final    URINE, RANDOM Performed at [facility], [address] [place], [place] [address]    Special Requests   Final    NONE Performed at [facility], [address] [place], [place] [address]    Culture MULTIPLE SPECIES PRESENT, SUGGEST RECOLLECTION (A)  Final   Report Status 04/05/2020 FINAL  Final  Blastomyces Antigen     Status: None   Collection Time: 03/22/20  3:17 AM  Specimen: Urine  Result Value Ref Range Status   Blastomyces Antigen None Detected None Detected ng/mL Final    Comment: (NOTE) Results reported as ng/mL in 0.2 - 14.7 ng/mL range Results above the limit of detection but below 0.2 ng/mL are reported as 'Positive, Below the Limit of Quantification' Results above 14.7 ng/mL are reported as 'Positive, Above the Limit of Quantification'    Specimen Type SERUM  Final    Comment: (NOTE) Performed At: [facility] [place], [place] [id number] Noralyn Pick MD TD:[id number]   MRSA PCR Screening     Status: Abnormal   Collection Time: 03/22/20  8:30 AM   Specimen: Nasopharyngeal  Result Value Ref Range Status   MRSA by PCR POSITIVE (A) NEGATIVE Final    Comment:        The GeneXpert MRSA Assay (FDA approved for NASAL specimens only), is one component of a comprehensive MRSA colonization surveillance program. It is not intended to diagnose MRSA infection nor to guide or monitor treatment for MRSA infections. RESULT  CALLED TO, READ BACK BY AND VERIFIED WITH:  [facility] WHITE AT 1007 03/22/20 SDR Performed at [facility], [address] [place], [place] [address]   Aerobic/Anaerobic Culture (surgical/deep wound)     Status: None (Preliminary result)   Collection Time: 03/26/2020  6:14 PM   Specimen: PATH Other; Wound  Result Value Ref Range Status   Specimen Description   Final    WOUND Performed at [facility], [address], [place], [place] [address]    Special Requests   Final    DEEP TISSUE SACRAL CULTURE Performed at [facility], [address] [place], [place] [address]    Gram Stain   Final    NO WBC SEEN ABUNDANT GRAM POSITIVE COCCI IN PAIRS FEW GRAM NEGATIVE RODS    Culture   Final    MODERATE METHICILLIN RESISTANT STAPHYLOCOCCUS AUREUS FEW STREPTOCOCCUS ANGINOSIS FEW ESCHERICHIA COLI HOLDING FOR POSSIBLE ANAEROBE Performed at [facility], [place] [address] [place],  [address]    Report Status PENDING  Incomplete   Organism ID, Bacteria METHICILLIN RESISTANT STAPHYLOCOCCUS AUREUS  Final   Organism ID, Bacteria STREPTOCOCCUS ANGINOSIS  Final   Organism ID, Bacteria ESCHERICHIA COLI  Final      Susceptibility   Escherichia coli - MIC*    AMPICILLIN >=32 RESISTANT Resistant     CEFAZOLIN 8 SENSITIVE Sensitive     CEFEPIME <=0.12 SENSITIVE Sensitive     CEFTAZIDIME <=1 SENSITIVE Sensitive     CEFTRIAXONE <=0.25 SENSITIVE Sensitive     CIPROFLOXACIN <=0.25 SENSITIVE Sensitive     GENTAMICIN <=1 SENSITIVE Sensitive     IMIPENEM <=0.25 SENSITIVE Sensitive     TRIMETH/SULFA <=20 SENSITIVE Sensitive     AMPICILLIN/SULBACTAM 16 INTERMEDIATE Intermediate     PIP/TAZO <=4 SENSITIVE Sensitive     * FEW ESCHERICHIA COLI   Methicillin resistant staphylococcus aureus - MIC*    CIPROFLOXACIN >=8 RESISTANT Resistant     ERYTHROMYCIN >=8 RESISTANT Resistant     GENTAMICIN <=0.5 SENSITIVE Sensitive     OXACILLIN >=4 RESISTANT Resistant      TETRACYCLINE <=1 SENSITIVE Sensitive     VANCOMYCIN 1 SENSITIVE Sensitive     TRIMETH/SULFA <=10 SENSITIVE Sensitive     CLINDAMYCIN <=0.25 SENSITIVE Sensitive     RIFAMPIN <=0.5 SENSITIVE Sensitive     Inducible Clindamycin NEGATIVE Sensitive     * MODERATE METHICILLIN RESISTANT STAPHYLOCOCCUS AUREUS  Streptococcus anginosis - MIC*    PENICILLIN <=0.06 SENSITIVE Sensitive     CEFTRIAXONE 0.5 SENSITIVE Sensitive     ERYTHROMYCIN <=0.12 SENSITIVE Sensitive     LEVOFLOXACIN <=0.25 SENSITIVE Sensitive     VANCOMYCIN 0.5 SENSITIVE Sensitive     * FEW STREPTOCOCCUS ANGINOSIS  Aerobic/Anaerobic Culture (surgical/deep wound)     Status: None (Preliminary result)   Collection Time: 03/18/2020  6:14 PM   Specimen: PATH Other; Wound  Result Value Ref Range Status   Specimen Description   Final    WOUND Performed at [facility], [address], [place], [place] [address]    Special Requests   Final    DEEP SACRAL BONE CULTURE Performed at [facility], [place], [place] [address]    Gram Stain NO WBC SEEN NO ORGANISMS SEEN   Final   Culture   Final    RARE STAPHYLOCOCCUS AUREUS HOLDING FOR POSSIBLE ANAEROBE Performed at [facility], [address] [place], [place] [address]    Report Status PENDING  Incomplete  Culture, blood (Routine X 2) w Reflex to ID Panel     Status: None   Collection Time: 04/06/2020 10:12 PM   Specimen: BLOOD  Result Value Ref Range Status   Specimen Description BLOOD RIGHT HAND  Final   Special Requests   Final    BOTTLES DRAWN AEROBIC AND ANAEROBIC Blood Culture adequate volume   Culture   Final    NO GROWTH 5 DAYS Performed at [facility], [address] [place], [place] [address]    Report Status 04-25-20 FINAL  Final  Culture, blood (Routine X 2) w Reflex to ID Panel     Status: None   Collection Time: 03/18/2020 10:13 PM   Specimen: BLOOD  Result Value Ref Range Status   Specimen Description  BLOOD LEFT HAND  Final   Special Requests   Final    BOTTLES DRAWN AEROBIC AND ANAEROBIC Blood Culture adequate volume   Culture   Final    NO GROWTH 5 DAYS Performed at [facility], [address] [place], [place] [address]    Report Status 2020-04-25 FINAL  Final          IMAGING    No results found.   Nutrition Status: Nutrition Problem: Severe Malnutrition Etiology: chronic illness (parkinson's) Signs/Symptoms: moderate fat depletion, severe fat depletion, moderate muscle depletion, severe muscle depletion Interventions: Refer to RD note for recommendations     Indwelling Urinary Catheter continued, requirement due to   Reason to continue Indwelling Urinary Catheter strict Intake/Output monitoring for hemodynamic instability   Central Line/ continued, requirement due to  Reason to continue [place] of central venous pressure or other hemodynamic parameters and poor IV access   Ventilator continued, requirement due to severe respiratory failure   Ventilator Sedation RASS 0 to -2      ASSESSMENT AND PLAN SYNOPSIS SEVERE SEPSIS MRSA BACTEREMIA WITH END STAGE PARKINSONS DISEASE GANGRENOUS SACRAL WOUND/OSTEO with POST OP RESP FAILURE AND SEPTIC SHOCK-PROGNOSIS IS VERY POOR, VERY POOR CHANCE OF MEANINGFUL RECOVERY, COMPLICATED BY ACUTE LEFT PTX FROM UNDERLYING MRSA CAVITARY LUNG LESIONS  patient is DNR/DNI and with severe multiorgan failure very poor chance of meaningful recovery  Patient is dying and suffering, recommend comfort care measures    Critical Care Time devoted to patient care services described in this note is 45 minutes.   Overall, patient is critically ill, prognosis is guarded.  Patient  with Multiorgan failure and at high risk for cardiac arrest and death.    Corrin Parker, M.D.  [facility]  Medical Director [place] Director [facility] Cardio-Pulmonary Department

## 2020-04-10 NOTE — Death Summary Note (Signed)
DEATH SUMMARY   Patient Details  Name: [name] MRN: [id number] DOB: 09/27/1955  Admission/Discharge Information   Admit Date:  2020-04-02  Date of Death: Date of Death: 2020-04-09  Time of Death: Time of Death: 2056-10-12  Length of Stay: 09/27/22  Referring Physician: Doreen Beam, FNP   Reason(s) for Hospitalization  severe sepsis with MOF  Diagnoses  Preliminary cause of death:  Secondary Diagnoses (including complications and co-morbidities):  Principal Problem:   AKI (acute kidney injury) ([place]) Active Problems:   Protein-calorie malnutrition, severe ([place])   MDD (major depressive disorder), recurrent episode, severe ([place])   Hyperlipidemia   Primary Parkinsonism ([place])   Weakness generalized   Hyponatremia   Hypokalemia   FTT (failure to thrive) in adult   Thrombocytopenia ([place])   Community acquired pneumonia   Sepsis ([place])   Sacral decubitus ulcer, stage IV ([place])   Severe sepsis [facility])   Brief Hospital Course (including significant findings, care, treatment, and services provided and events leading to death)  ANTWOIN Stephens is a 64 y.o. year old male with history of Parkinson's disease, previous stroke, hyperlipidemia, BPH, malnutrition, vitamin D deficiency, severe depression, s/p ECT treatment 12-Oct-2017) and anxiety who was admitted with working diagnosis of multifocal pneumonia with cavitary lesions, concern for atypical infection, rule out TB, septic shock requiring low-dose vasopressors and severe AKI.  He was admitted to the ICU and continued on pressors, broad-spectrum antibiotics and IV fluids. His blood cultures were positive for MRSA bacteremia.  9/12 admitted for septic shock MRSA bacteremia with sacral decub 9/13 post opresp failure, septic shock and sepsis 9/13 Liquified necrotic fat was encountered. A pocket of pus was cultured. excise tissue until all visibly necrotic areas were excised down to bleeding tissue. The sacral bone was exposed.  9/14 post op  resp failure due to septic shock 9/15 severe septic shock 9/16 severe septic shock, LEFT PTX, chest tube placed 9/17 critically ill, multiorgan failure 04-10-23: Patient made comfort care and expired peacefully   Pertinent Labs and Studies  Significant Diagnostic Studies CT ABDOMEN PELVIS WO CONTRAST  Result Date: 2020/04/02 CLINICAL DATA:  Abdominal pain and decreased appetite x2 days. EXAM: CT ABDOMEN AND PELVIS WITHOUT CONTRAST TECHNIQUE: Multidetector CT imaging of the abdomen and pelvis was performed following the standard protocol without IV contrast. COMPARISON:  Lumbar spine MRI, dated January 08, 2018. FINDINGS: Lower chest: Numerous small cavitary lesions, with mild to moderately thickened surrounding walls, are seen scattered throughout the bilateral lung bases. A 1.2 cm noncalcified lung nodule is seen within the right lung base (axial CT image 10, CT series number 3). Mild to moderate severity areas of atelectasis and/or infiltrate are noted along the posterior aspects of the bilateral lung bases. Very small bilateral pleural effusions are seen. A very small predominantly anterior pericardial effusion is noted. Hepatobiliary: No focal liver abnormality is seen. No gallstones, gallbladder wall thickening, or biliary dilatation. Pancreas: Unremarkable. No pancreatic ductal dilatation or surrounding inflammatory changes. Spleen: Normal in size without focal abnormality. Adrenals/Urinary Tract: The right adrenal gland is normal in size and appearance. A 3.4 cm x 1.3 cm low-attenuation left adrenal mass is seen (axial CT image 33, CT series number 2). Kidneys are normal, without renal calculi, focal lesion, or hydronephrosis. Bladder is unremarkable. Stomach/Bowel: Stomach is within normal limits. Appendix appears normal. No evidence of bowel dilatation. Noninflamed diverticula are seen within the descending and sigmoid colon. Vascular/Lymphatic: There is mild to moderate severity calcification of the  abdominal aorta and  bilateral common iliac arteries, without evidence of aneurysmal dilatation. No enlarged abdominal or pelvic lymph nodes. Reproductive: There is mild to moderate severity enlargement of the prostate gland. Other: Multiple small surgical coils are seen along the anterior pelvic wall. No abdominopelvic ascites. Musculoskeletal: A compression fracture deformity of indeterminate age is seen at the level of L1. The this is not seen on the prior lumbar spine MRI, dated January 08, 2018. No retropulsion of fracture fragments is identified. Mild-to-moderate severity multilevel degenerative changes seen throughout the lumbar spine IMPRESSION: 1. Numerous small cavitary lesions, with mild to moderately thickened surrounding walls, scattered throughout the bilateral lung bases. While this may be secondary to an atypical infectious process, sequelae associated with septic emboli cannot be excluded. 2. 1.2 cm noncalcified lung nodule within the right lung base. Correlation with follow-up chest CT is recommended to determine the presence or absence of additional noncalcified lung nodules. Additional follow-up with a nuclear medicine PET/CT should be considered. 3. Very small bilateral pleural effusions. 4. Colonic diverticulosis. 5. Compression fracture deformity of indeterminate age at the level of L1. The this is not seen on the prior lumbar spine MRI, dated January 08, 2018. Correlation with point tenderness is recommended. Additional MRI follow-up should be considered. 6. Mild to moderate severity enlargement of the prostate gland. 7. Aortic atherosclerosis. Aortic Atherosclerosis (ICD10-I70.0). Electronically Signed   By: Virgina Norfolk M.D.   On: 04/03/2020 20:19   DG Chest 1 View  Addendum Date: 03/29/2020   ADDENDUM REPORT: 04/01/2020 22:50 ADDENDUM: Critical Value/emergent results were called by telephone at the time of interpretation on 04/06/2020 At 2230 hours to provider [facility] , who verbally  acknowledged these results. Electronically Signed   By: Genevie Ann M.D.   On: 03/15/2020 22:50   Result Date: 03/29/2020 CLINICAL DATA:  64 year old male with respiratory failure. Septic emboli versus atypical infection suspected on recent chest CT. EXAM: CHEST  1 VIEW COMPARISON:  Chest CT 03/22/2020 and earlier. FINDINGS: Portable AP semi upright view at 2213 hours. The patient is intubated now. Endotracheal tube tip at the level the clavicles. Enteric tube courses to the abdomen, tip not included. Large left pneumothorax is new, with mild rightward mediastinal shift compatible with tension. Superimposed widespread patchy bilateral pulmonary opacity. Superimposed veiling opacity in both lungs compatible with pleural effusions which may have increased from the recent CT. No acute osseous abnormality identified. IMPRESSION: 1. Positive for new left side Tension Pneumothorax. 2. Endotracheal tube and enteric tube placement appears satisfactory. 3. Underlying bilateral pneumonia and pleural effusions. Electronically Signed: By: Genevie Ann M.D. On: 04/09/2020 22:28   CT CHEST WO CONTRAST  Result Date: 03/22/2020 CLINICAL DATA:  Decreased appetite for 2 days. Intermittent fever for 1 week. Cavitary lesion seen in the lung bases on CT abdomen and pelvis. Completion imaging of the chest. EXAM: CT CHEST WITHOUT CONTRAST TECHNIQUE: Multidetector CT imaging of the chest was performed following the standard protocol without IV contrast. COMPARISON:  CT abdomen and pelvis 03/25/2020.  Chest 04/09/2020 FINDINGS: Cardiovascular: Normal heart size. No pericardial effusions. Dilated ascending thoracic aorta measuring 4.1 cm diameter. Scattered aortic calcification. Mediastinum/Nodes: Scattered mediastinal lymph nodes are not pathologically enlarged. Esophagus is decompressed. Visualized thyroid gland is unremarkable. Mucoid material demonstrated in the trachea. Lungs/Pleura: Both lungs demonstrate multiple nodular and confluent  areas of consolidation. Many of the nodular areas demonstrate central cavitation. Changes are diffusely scattered throughout the lungs. There is more prominent atelectasis or consolidation in the lung bases and in the  right upper lung. Small bilateral pleural effusions. Differential diagnosis would include either septic emboli or atypical infectious inflammatory process such as TB or fungal infection. Cavitary metastatic disease would also be possible. Upper Abdomen: No acute process demonstrated in the visualized upper abdomen. Musculoskeletal: No destructive bone lesions. IMPRESSION: 1. Both lungs demonstrate multiple nodular and confluent areas of consolidation. Most of the nodular areas demonstrate central cavitation. Differential diagnosis would include either septic emboli or atypical infectious or inflammatory process such as TB or fungal infection. Cavitary metastatic disease would also be possible. Pulmonology referral is suggested. 2. Small bilateral pleural effusions. 3. Dilated ascending thoracic aorta measuring 4.1 cm diameter. 4. Aortic atherosclerosis. Aortic Atherosclerosis (ICD10-I70.0). Electronically Signed   By: Lucienne Capers M.D.   On: 03/22/2020 04:03   US RENAL  Result Date: 03/22/2020 CLINICAL DATA:  Acute kidney injury EXAM: RENAL / URINARY TRACT ULTRASOUND COMPLETE COMPARISON:  CT abdomen and pelvis 03/31/2020 FINDINGS: Right Kidney: Renal measurements: 11.1 x 5.0 x 5.9 cm = volume: 170 mL. Cortical thinning. Slightly increased cortical echogenicity. No mass, hydronephrosis, or shadowing calcification. Left Kidney: Renal measurements: 12.4 x 5.2 x 5.5 cm = volume: 185 mL. Cortical thinning. Normal cortical echogenicity. No mass, hydronephrosis, or shadowing calcification. Bladder: Decompressed, unable to adequately evaluate Other: N/A IMPRESSION: Question medical renal disease changes noted at RIGHT kidney. No evidence of renal mass or hydronephrosis. Electronically Signed   By: Lavonia Dana M.D.   On: 03/22/2020 10:13   DG Chest Port 1 View  Addendum Date: 04-19-20   ADDENDUM REPORT: 2020-04-19 19:00 ADDENDUM: Dr. Mortimer Fries notified. Electronically Signed   By: Dorise Bullion III M.D   On: 2020/04/19 19:00   Result Date: 04/19/2020 CLINICAL DATA:  Left-sided chest tube.  Pneumonia and sepsis. EXAM: PORTABLE CHEST 1 VIEW COMPARISON:  March 26, 2020 FINDINGS: There is a right-sided pneumothorax, larger in the interval measuring 3.9 cm at the apex. A left chest tube remains in place. No definite left pneumothorax identified. The ETT is in good position. The NG tube terminates below today's film. Diffuse bilateral pulmonary infiltrates are stable on the right and worsened in the left base in the interval. Stable cardiomediastinal silhouette. A right pleural effusion is again identified. IMPRESSION: 1. Worsening right-sided pneumothorax measuring 3.9 cm at the apex. 2. Left-sided chest tube without definitive left-sided pneumothorax. 3. Worsening infiltrate in the left base. The infiltrates are otherwise stable. 4. Persistent right pleural effusion. Findings will be called to the referring physician. Electronically Signed: By: Dorise Bullion III M.D On: 04-19-20 10:53   DG CHEST PORT 1 VIEW  Result Date: 03/26/2020 CLINICAL DATA:  Chest tube placement EXAM: PORTABLE CHEST 1 VIEW COMPARISON:  03/25/2020 FINDINGS: Endotracheal tube tip is about 6 cm superior to the carina. Esophageal tube tip below the diaphragm but incompletely visualized. Additional tube port catheter tip projecting over the mid to upper mediastinum. Interval placement of left-sided chest tube with tip at the pulmonary apex. Decreased left pneumothorax with small residual apicolateral pneumothorax visible. Small left and moderate right pleural effusion. Extensive bilateral pulmonary infiltrates with worsening consolidation at the right base. Stable cardiomediastinal silhouette. New left chest wall emphysema.  IMPRESSION: 1. Interval placement of left-sided chest tube with decreased size of left pneumothorax. Small residual left apicolateral pneumothorax. New left chest wall emphysema. 2. Extensive bilateral pulmonary infiltrates with worsening consolidation at the right base. Small left and moderate right pleural effusions. Electronically Signed   By: Donavan Foil M.D.   On: 03/26/2020 00:20  DG Chest Port 1 View  Result Date: 03/19/2020 CLINICAL DATA:  Sepsis fever, decreased appetite, dark foul-smelling urine, Parkinson's disease, former smoker EXAM: PORTABLE CHEST 1 VIEW COMPARISON:  Portable exam 1753 hours compared to 09/30/2019 FINDINGS: Normal heart size, mediastinal contours, and pulmonary vascularity. Patchy airspace infiltrates bilaterally consistent with multifocal pneumonia. No pleural effusion or pneumothorax. Osseous structures unremarkable. IMPRESSION: Patchy BILATERAL pulmonary infiltrates consistent with multifocal pneumonia; cannot exclude COVID-19 with this appearance. Electronically Signed   By: Lavonia Dana M.D.   On: 03/27/2020 18:12   DG Abd Portable 1V  Result Date: 03/30/2020 CLINICAL DATA:  OG tube placement EXAM: PORTABLE ABDOMEN - 1 VIEW COMPARISON:  CT 03/22/2020 FINDINGS: Esophageal tube tip and side port overlie the gastric body. Air-filled but nondilated central small bowel with scattered colon gas. Questionable fluid level beneath the right diaphragm IMPRESSION: 1. Esophageal tube tip and side port overlie the gastric body. 2. Possible fluid level beneath the right diaphragm, suggest decubitus view. These results will be called to the ordering clinician or representative by the Radiologist Assistant, and communication documented in the PACS or [employer]. Electronically Signed   By: Donavan Foil M.D.   On: 03/20/2020 19:33   ECHOCARDIOGRAM COMPLETE  Result Date: 03/17/2020    ECHOCARDIOGRAM REPORT   Patient Name:   KIMI KROFT Costabile Date of Exam: 03/12/2020 Medical Rec #:   [id number]       Height:       71.0 in Accession #:    [id number]      Weight:       163.1 lb Date of Birth:  [birth year]        BSA:          1.934 m Patient Age:    43 years        BP:           107/64 mmHg Patient Gender: M               HR:           96 bpm. Exam Location:  [facility] Procedure: 2D Echo, Color Doppler and Cardiac Doppler Indications:     R50.9 Fever  History:         Patient has no prior history of Echocardiogram examinations.                  Stroke; Risk Factors:Dyslipidemia.  Sonographer:     Charmayne Sheer RDCS (AE) Referring Phys:  [id number] Cheryll Dessert Diagnosing Phys: Kathlyn Sacramento MD IMPRESSIONS  1. Left ventricular ejection fraction, by estimation, is 60 to 65%. The left ventricle has normal function. The left ventricle has no regional wall motion abnormalities. Left ventricular diastolic parameters were normal.  2. Right ventricular systolic function is normal. The right ventricular size is normal. Tricuspid regurgitation signal is inadequate for assessing PA pressure.  3. The mitral valve is normal in structure. No evidence of mitral valve regurgitation. No evidence of mitral stenosis.  4. The aortic valve is normal in structure. Aortic valve regurgitation is not visualized. Mild aortic valve sclerosis is present, with no evidence of aortic valve stenosis.  5. The inferior vena cava is normal in size with greater than 50% respiratory variability, suggesting right atrial pressure of 3 mmHg. Conclusion(s)/Recommendation(s): No evidence of valvular vegetations on this transthoracic echocardiogram. FINDINGS  Left Ventricle: Left ventricular ejection fraction, by estimation, is 60 to 65%. The left ventricle has normal function. The left ventricle has no regional wall motion abnormalities.  The left ventricular internal cavity size was normal in size. There is  no left ventricular hypertrophy. Left ventricular diastolic parameters were normal. Right Ventricle: The right ventricular size is normal.  No increase in right ventricular wall thickness. Right ventricular systolic function is normal. Tricuspid regurgitation signal is inadequate for assessing PA pressure. Left Atrium: Left atrial size was normal in size. Right Atrium: Right atrial size was normal in size. Pericardium: There is no evidence of pericardial effusion. Mitral Valve: The mitral valve is normal in structure. No evidence of mitral valve regurgitation. No evidence of mitral valve stenosis. MV peak gradient, 5.8 mmHg. The mean mitral valve gradient is 3.0 mmHg. Tricuspid Valve: The tricuspid valve is normal in structure. Tricuspid valve regurgitation is not demonstrated. No evidence of tricuspid stenosis. Aortic Valve: The aortic valve is normal in structure. Aortic valve regurgitation is not visualized. Mild aortic valve sclerosis is present, with no evidence of aortic valve stenosis. Aortic valve mean gradient measures 5.0 mmHg. Aortic valve peak gradient measures 10.1 mmHg. Aortic valve area, by VTI measures 2.42 cm. Pulmonic Valve: The pulmonic valve was normal in structure. Pulmonic valve regurgitation is not visualized. No evidence of pulmonic stenosis. Aorta: The aortic root is normal in size and structure. Venous: The inferior vena cava is normal in size with greater than 50% respiratory variability, suggesting right atrial pressure of 3 mmHg. IAS/Shunts: No atrial level shunt detected by color flow Doppler.  LEFT VENTRICLE PLAX 2D LVIDd:         4.59 cm  Diastology LVIDs:         2.99 cm  LV e' medial:    9.14 cm/s LV PW:         0.92 cm  LV E/e' medial:  11.4 LV IVS:        0.81 cm  LV e' lateral:   10.60 cm/s LVOT diam:     1.90 cm  LV E/e' lateral: 9.8 LV SV:         64 LV SV Index:   33 LVOT Area:     2.84 cm  RIGHT VENTRICLE RV Basal diam:  3.02 cm LEFT ATRIUM             Index       RIGHT ATRIUM           Index LA diam:        3.40 cm 1.76 cm/m  RA Area:     13.00 cm LA Vol (A2C):   31.8 ml 16.45 ml/m RA Volume:   30.90 ml   15.98 ml/m LA Vol (A4C):   29.8 ml 15.41 ml/m LA Biplane Vol: 34.2 ml 17.69 ml/m  AORTIC VALVE                    PULMONIC VALVE AV Area (Vmax):    2.09 cm     PV Vmax:       1.18 m/s AV Area (Vmean):   2.06 cm     PV Vmean:      69.600 cm/s AV Area (VTI):     2.42 cm     PV VTI:        0.174 m AV Vmax:           159.00 cm/s  PV Peak grad:  5.6 mmHg AV Vmean:          107.000 cm/s PV Mean grad:  2.0 mmHg AV VTI:  0.266 m AV Peak Grad:      10.1 mmHg AV Mean Grad:      5.0 mmHg LVOT Vmax:         117.00 cm/s LVOT Vmean:        77.800 cm/s LVOT VTI:          0.227 m LVOT/AV VTI ratio: 0.85  AORTA Ao Root diam: 3.20 cm MITRAL VALVE MV Area (PHT): 6.96 cm     SHUNTS MV Peak grad:  5.8 mmHg     Systemic VTI:  0.23 m MV Mean grad:  3.0 mmHg     Systemic Diam: 1.90 cm MV Vmax:       1.20 m/s MV Vmean:      82.1 cm/s MV Decel Time: 109 msec MV E velocity: 104.00 cm/s MV A velocity: 85.40 cm/s MV E/A ratio:  1.22 Kathlyn Sacramento MD Electronically signed by Kathlyn Sacramento MD Signature Date/Time: 03/25/2020/8:55:45 AM    Final     Microbiology Recent Results (from the past 240 hour(s))  Blood Culture (routine x 2)     Status: Abnormal   Collection Time: 03/20/2020  5:43 PM   Specimen: BLOOD  Result Value Ref Range Status   Specimen Description   Final    BLOOD LEFT FORE ARM Performed at [facility], [address] [place], [place] [address]    Special Requests   Final    BOTTLES DRAWN AEROBIC AND ANAEROBIC Blood Culture adequate volume Performed at [facility], [address] [place], [place] [address]    Culture  Setup Time   Final    GRAM POSITIVE COCCI IN BOTH AEROBIC AND ANAEROBIC BOTTLES CRITICAL VALUE NOTED.  VALUE IS CONSISTENT WITH PREVIOUSLY REPORTED AND CALLED VALUE. Performed at [facility], [address] [place], [place] [address]    Culture (A)  Final    STAPHYLOCOCCUS AUREUS SUSCEPTIBILITIES PERFORMED ON PREVIOUS CULTURE WITHIN THE LAST 5  DAYS. Performed at [facility], [place] [address] [place], [place] [address]    Report Status 03/24/2020 FINAL  Final  SARS Coronavirus 2 by RT PCR (hospital order, performed in [facility] hospital lab) Nasopharyngeal Nasopharyngeal Swab     Status: None   Collection Time: 03/12/2020  5:43 PM   Specimen: Nasopharyngeal Swab  Result Value Ref Range Status   SARS Coronavirus 2 NEGATIVE NEGATIVE Final    Comment: (NOTE) SARS-CoV-2 target nucleic acids are NOT DETECTED.  The SARS-CoV-2 RNA is generally detectable in upper and lower respiratory specimens during the acute phase of infection. The lowest concentration of SARS-CoV-2 viral copies this assay can detect is 250 copies / mL. A negative result does not preclude SARS-CoV-2 infection and should not be used as the sole basis for treatment or other patient management decisions.  A negative result may occur with improper specimen collection / handling, submission of specimen other than nasopharyngeal swab, presence of viral mutation(s) within the areas targeted by this assay, and inadequate number of viral copies (<250 copies / mL). A negative result must be combined with clinical observations, patient history, and epidemiological information.  Fact Sheet for Patients:   StrictlyIdeas.no  Fact Sheet for Healthcare Providers: BankingDealers.co.za  This test is not yet approved or  cleared by the [place] FDA and has been authorized for detection and/or diagnosis of SARS-CoV-2 by FDA under an Emergency Use Authorization (EUA).  This EUA will remain in effect (meaning this test can be used) for the duration of the  COVID-19 declaration under Section 564(b)(1) of the Act, 21 U.S.C. section 360bbb-3(b)(1), unless the authorization is terminated or revoked sooner.  Performed at [facility], [address] [place], [place] [address]   Blood Culture (routine x 2)      Status: Abnormal   Collection Time: 04/06/2020  5:44 PM   Specimen: BLOOD  Result Value Ref Range Status   Specimen Description   Final    BLOOD RIGHT FORE ARM Performed at [facility], [address] [place], [place] [address]    Special Requests   Final    BOTTLES DRAWN AEROBIC AND ANAEROBIC Blood Culture adequate volume Performed at [facility], [address] [place], [place] [address]    Culture  Setup Time   Final    Organism ID to follow GRAM POSITIVE COCCI IN BOTH AEROBIC AND ANAEROBIC BOTTLES CRITICAL RESULT CALLED TO, READ BACK BY AND VERIFIED WITH: DAVID BESANTI AT 1638 03/22/20 [place] Performed at [facility], [address] [place], [place] [address]    Culture METHICILLIN RESISTANT STAPHYLOCOCCUS AUREUS (A)  Final   Report Status 03/24/2020 FINAL  Final   Organism ID, Bacteria METHICILLIN RESISTANT STAPHYLOCOCCUS AUREUS  Final      Susceptibility   Methicillin resistant staphylococcus aureus - MIC*    CIPROFLOXACIN >=8 RESISTANT Resistant     ERYTHROMYCIN >=8 RESISTANT Resistant     GENTAMICIN <=0.5 SENSITIVE Sensitive     OXACILLIN >=4 RESISTANT Resistant     TETRACYCLINE <=1 SENSITIVE Sensitive     VANCOMYCIN 1 SENSITIVE Sensitive     TRIMETH/SULFA <=10 SENSITIVE Sensitive     CLINDAMYCIN <=0.25 SENSITIVE Sensitive     RIFAMPIN <=0.5 SENSITIVE Sensitive     Inducible Clindamycin NEGATIVE Sensitive     * METHICILLIN RESISTANT STAPHYLOCOCCUS AUREUS  Blood Culture ID Panel (Reflexed)     Status: Abnormal   Collection Time: 03/12/2020  5:44 PM  Result Value Ref Range Status   Enterococcus faecalis NOT DETECTED NOT DETECTED Final   Enterococcus Faecium NOT DETECTED NOT DETECTED Final   Listeria monocytogenes NOT DETECTED NOT DETECTED Final   Staphylococcus species DETECTED (A) NOT DETECTED Final    Comment: CRITICAL RESULT CALLED TO, READ BACK BY AND VERIFIED WITH:  DAVID BESANTI AT 0607 03/22/20 SDR    Staphylococcus aureus (BCID)  DETECTED (A) NOT DETECTED Final    Comment: Methicillin (oxacillin)-resistant Staphylococcus aureus (MRSA). MRSA is predictably resistant to beta-lactam antibiotics (except ceftaroline). Preferred therapy is vancomycin unless clinically contraindicated. Patient requires contact precautions if  hospitalized. CRITICAL RESULT CALLED TO, READ BACK BY AND VERIFIED WITH:  DAVID BESANTI AT 0607 03/22/20 SDR    Staphylococcus epidermidis NOT DETECTED NOT DETECTED Final   Staphylococcus lugdunensis NOT DETECTED NOT DETECTED Final   Streptococcus species NOT DETECTED NOT DETECTED Final   Streptococcus agalactiae NOT DETECTED NOT DETECTED Final   Streptococcus pneumoniae NOT DETECTED NOT DETECTED Final   Streptococcus pyogenes NOT DETECTED NOT DETECTED Final   A.calcoaceticus-baumannii NOT DETECTED NOT DETECTED Final   Bacteroides fragilis NOT DETECTED NOT DETECTED Final   Enterobacterales NOT DETECTED NOT DETECTED Final   Enterobacter cloacae complex NOT DETECTED NOT DETECTED Final   Escherichia coli NOT DETECTED NOT DETECTED Final   Klebsiella aerogenes NOT DETECTED NOT DETECTED Final   Klebsiella oxytoca NOT DETECTED NOT DETECTED Final   Klebsiella pneumoniae NOT DETECTED NOT DETECTED Final   Proteus species NOT DETECTED NOT DETECTED Final   Salmonella species NOT DETECTED NOT DETECTED Final   Serratia  marcescens NOT DETECTED NOT DETECTED Final   Haemophilus influenzae NOT DETECTED NOT DETECTED Final   Neisseria meningitidis NOT DETECTED NOT DETECTED Final   Pseudomonas aeruginosa NOT DETECTED NOT DETECTED Final   Stenotrophomonas maltophilia NOT DETECTED NOT DETECTED Final   Candida albicans NOT DETECTED NOT DETECTED Final   Candida auris NOT DETECTED NOT DETECTED Final   Candida glabrata NOT DETECTED NOT DETECTED Final   Candida krusei NOT DETECTED NOT DETECTED Final   Candida parapsilosis NOT DETECTED NOT DETECTED Final   Candida tropicalis NOT DETECTED NOT DETECTED Final   Cryptococcus  neoformans/gattii NOT DETECTED NOT DETECTED Final   Meth resistant mecA/C and MREJ DETECTED (A) NOT DETECTED Final    Comment: CRITICAL RESULT CALLED TO, READ BACK BY AND VERIFIED WITH:  DAVID BESANTI AT 0607 03/22/20 SDR Performed at [facility], [address], [place], [place] [address]   Urine culture     Status: Abnormal   Collection Time: 03/24/2020  6:52 PM   Specimen: Urine, Random  Result Value Ref Range Status   Specimen Description   Final    URINE, RANDOM Performed at [facility], [address], [place], [place] [address]    Special Requests   Final    NONE Performed at [facility], [address] [place], [place] [address]    Culture MULTIPLE SPECIES PRESENT, SUGGEST RECOLLECTION (A)  Final   Report Status 03/16/2020 FINAL  Final  Blastomyces Antigen     Status: None   Collection Time: 03/22/20  3:17 AM   Specimen: Urine  Result Value Ref Range Status   Blastomyces Antigen None Detected None Detected ng/mL Final    Comment: (NOTE) Results reported as ng/mL in 0.2 - 14.7 ng/mL range Results above the limit of detection but below 0.2 ng/mL are reported as 'Positive, Below the Limit of Quantification' Results above 14.7 ng/mL are reported as 'Positive, Above the Limit of Quantification'    Specimen Type SERUM  Final    Comment: (NOTE) Performed At: [place], [place] [id number] Noralyn Pick MD SH:[id number]   MRSA PCR Screening     Status: Abnormal   Collection Time: 03/22/20  8:30 AM   Specimen: Nasopharyngeal  Result Value Ref Range Status   MRSA by PCR POSITIVE (A) NEGATIVE Final    Comment:        The GeneXpert MRSA Assay (FDA approved for NASAL specimens only), is one component of a comprehensive MRSA colonization surveillance program. It is not intended to diagnose MRSA infection nor to guide or monitor treatment for MRSA infections. RESULT CALLED TO, READ BACK BY AND  VERIFIED WITH:  [facility] WHITE AT 1007 03/22/20 SDR Performed at [facility], [address], [place], [place] [address]   Aerobic/Anaerobic Culture (surgical/deep wound)     Status: None   Collection Time: 03/26/2020  6:14 PM   Specimen: PATH Other; Wound  Result Value Ref Range Status   Specimen Description   Final    WOUND Performed at [facility], [address], [place], [place] [address]    Special Requests   Final    DEEP TISSUE SACRAL CULTURE Performed at [facility], [place] [place],  [address]    Gram Stain   Final    NO WBC SEEN ABUNDANT GRAM POSITIVE COCCI IN PAIRS FEW GRAM NEGATIVE RODS    Culture   Final    MODERATE METHICILLIN RESISTANT STAPHYLOCOCCUS AUREUS FEW STREPTOCOCCUS  ANGINOSIS FEW ESCHERICHIA COLI ABUNDANT BACTEROIDES FRAGILIS BETA LACTAMASE POSITIVE Performed at [facility], [place] [address], [place], [place] [address]    Report Status 04-02-2020 FINAL  Final   Organism ID, Bacteria METHICILLIN RESISTANT STAPHYLOCOCCUS AUREUS  Final   Organism ID, Bacteria STREPTOCOCCUS ANGINOSIS  Final   Organism ID, Bacteria ESCHERICHIA COLI  Final      Susceptibility   Escherichia coli - MIC*    AMPICILLIN >=32 RESISTANT Resistant     CEFAZOLIN 8 SENSITIVE Sensitive     CEFEPIME <=0.12 SENSITIVE Sensitive     CEFTAZIDIME <=1 SENSITIVE Sensitive     CEFTRIAXONE <=0.25 SENSITIVE Sensitive     CIPROFLOXACIN <=0.25 SENSITIVE Sensitive     GENTAMICIN <=1 SENSITIVE Sensitive     IMIPENEM <=0.25 SENSITIVE Sensitive     TRIMETH/SULFA <=20 SENSITIVE Sensitive     AMPICILLIN/SULBACTAM 16 INTERMEDIATE Intermediate     PIP/TAZO <=4 SENSITIVE Sensitive     * FEW ESCHERICHIA COLI   Methicillin resistant staphylococcus aureus - MIC*    CIPROFLOXACIN >=8 RESISTANT Resistant     ERYTHROMYCIN >=8 RESISTANT Resistant     GENTAMICIN <=0.5 SENSITIVE Sensitive     OXACILLIN >=4 RESISTANT Resistant     TETRACYCLINE <=1 SENSITIVE  Sensitive     VANCOMYCIN 1 SENSITIVE Sensitive     TRIMETH/SULFA <=10 SENSITIVE Sensitive     CLINDAMYCIN <=0.25 SENSITIVE Sensitive     RIFAMPIN <=0.5 SENSITIVE Sensitive     Inducible Clindamycin NEGATIVE Sensitive     * MODERATE METHICILLIN RESISTANT STAPHYLOCOCCUS AUREUS   Streptococcus anginosis - MIC*    PENICILLIN <=0.06 SENSITIVE Sensitive     CEFTRIAXONE 0.5 SENSITIVE Sensitive     ERYTHROMYCIN <=0.12 SENSITIVE Sensitive     LEVOFLOXACIN <=0.25 SENSITIVE Sensitive     VANCOMYCIN 0.5 SENSITIVE Sensitive     * FEW STREPTOCOCCUS ANGINOSIS  Aerobic/Anaerobic Culture (surgical/deep wound)     Status: None   Collection Time: 04/07/2020  6:14 PM   Specimen: PATH Other; Wound  Result Value Ref Range Status   Specimen Description   Final    WOUND Performed at [facility], [address] [place], [place] [address]    Special Requests   Final    DEEP SACRAL BONE CULTURE Performed at [facility], [address] [place], [place] [address]    Gram Stain   Final    NO WBC SEEN NO ORGANISMS SEEN Performed at [facility], [place] [address] [place], [place] [address]    Culture   Final    RARE STAPHYLOCOCCUS AUREUS SUSCEPTIBILITIES PERFORMED ON PREVIOUS CULTURE WITHIN THE LAST 5 DAYS. RARE BACTEROIDES FRAGILIS BETA LACTAMASE POSITIVE RARE STREPTOCOCCUS GROUP C    Report Status Apr 02, 2020 FINAL  Final  Culture, blood (Routine X 2) w Reflex to ID Panel     Status: None   Collection Time: 03/13/2020 10:12 PM   Specimen: BLOOD  Result Value Ref Range Status   Specimen Description BLOOD RIGHT HAND  Final   Special Requests   Final    BOTTLES DRAWN AEROBIC AND ANAEROBIC Blood Culture adequate volume   Culture   Final    NO GROWTH 5 DAYS Performed at [facility], [address] [place], [place] [address]    Report Status 2020-04-02 FINAL  Final  Culture, blood (Routine X 2) w Reflex to ID Panel     Status: None   Collection Time: 04/09/2020 10:13  PM   Specimen: BLOOD  Result Value  Ref Range Status   Specimen Description BLOOD LEFT HAND  Final   Special Requests   Final    BOTTLES DRAWN AEROBIC AND ANAEROBIC Blood Culture adequate volume   Culture   Final    NO GROWTH 5 DAYS Performed at [facility], [address] [place], [place] [address]    Report Status Apr 24, 2020 FINAL  Final    Lab Basic Metabolic Panel: Recent Labs  Lab 03/31/2020 1922 04/04/2020 1922 03/22/2020 2211 03/30/2020 0420 03/26/20 0444 03/26/20 1529 03/27/20 0531 2020-04-24 0342  NA 146*  --   --  150* 151*  --  152* 152*  K 3.5   < >  --  4.2 4.5 4.2 4.2 4.0  CL 114*  --   --  120* 120*  --  118* 118*  CO2 20*  --   --  21* 23  --  23 27  GLUCOSE 105*  --   --  129* 120*  --  191* 114*  BUN 83*  --   --  78* 75*  --  54* 54*  CREATININE 2.42*  --   --  2.20* 2.23*  --  1.52* 1.41*  CALCIUM 7.7*  --   --  7.4* 7.6*  --  7.5* 7.6*  MG  --   --  2.4 2.2  --  2.3 2.3 2.4  PHOS  --   --   --  6.1*  --   --  4.3 3.0   < > = values in this interval not displayed.   Liver Function Tests: Recent Labs  Lab 03/22/20 0517 03/27/20 0531 04/24/20 0342  AST 93*  --   --   ALT 17  --   --   ALKPHOS 78  --   --   BILITOT 1.8*  --   --   PROT 4.8*  --   --   ALBUMIN 1.7* 1.3* 1.3*   No results for input(s): LIPASE, AMYLASE in the last 168 hours. No results for input(s): AMMONIA in the last 168 hours. CBC: Recent Labs  Lab 04/05/2020 1346 03/15/2020 1346 03/29/2020 1922 03/24/2020 0819 03/26/20 0444 03/27/20 0531 2020-04-24 0342  WBC 17.8*   < > 14.8* 23.0* 19.6* 16.1* 14.8*  NEUTROABS 16.3*  --   --   --   --  14.7* 13.4*  HGB 9.8*   < > 9.8* 8.8* 8.7* 9.0* 8.6*  HCT 28.2*   < > 29.1* 27.9* 28.4* 29.3* 26.8*  MCV 87.9   < > 89.3 95.5 98.6 98.3 92.7  PLT 62*   < > 51* 51* 40* 56* 50*   < > = values in this interval not displayed.   Cardiac Enzymes: No results for input(s): CKTOTAL, CKMB, CKMBINDEX, TROPONINI in the last 168 hours. Sepsis  Labs: Recent Labs  Lab 03/22/20 0517 03/29/2020 0441 03/16/2020 1922 03/18/2020 1922 04/05/2020 2211 03/25/20 0819 03/26/20 0443 03/26/20 0444 03/27/20 0531 2020-04-24 0342  WBC 11.5*   < > 14.8*   < >  --  23.0*  --  19.6* 16.1* 14.8*  LATICACIDVEN 4.3*  --  2.4*  --  2.2*  --  1.7  --   --   --    < > = values in this interval not displayed.    Procedures/Operations  Left chest tube placement Endotracheal intubation Central venous catheter placement   Magddalene S Tukov-Yual 2020/04/24, 10:53 PM

## 2020-04-10 DEATH — deceased

## 2020-09-15 ENCOUNTER — Telehealth: Payer: Self-pay

## 2020-09-15 NOTE — Telephone Encounter (Signed)
Copied from [place] [phone]. Topic: General - Other >> Sep 15, 2020 11:08 AM Yvette Rack wrote: Reason for CRM: Shaneka with [employer] called for an update on when the documents will be signed and returned. Cb# [phone] Ext. [phone]

## 2020-09-15 NOTE — Telephone Encounter (Signed)
Unsure what documents they are referring to. Please verify.

## 2020-09-16 NOTE — Telephone Encounter (Signed)
Ok will review and sign when return to the office on 09/17/20

## 2020-09-16 NOTE — Telephone Encounter (Signed)
Order form is in your box up front for you to review and sign. KW
